# Patient Record
Sex: Male | Born: 1960
Health system: Southern US, Community
[De-identification: ages and names within clinical notes are randomized; demographics above are authoritative.]

## PROBLEM LIST (undated history)

## (undated) DIAGNOSIS — M5030 Other cervical disc degeneration, unspecified cervical region: Secondary | ICD-10-CM

## (undated) DIAGNOSIS — R011 Cardiac murmur, unspecified: Secondary | ICD-10-CM

## (undated) DIAGNOSIS — R002 Palpitations: Secondary | ICD-10-CM

## (undated) DIAGNOSIS — E785 Hyperlipidemia, unspecified: Secondary | ICD-10-CM

## (undated) DIAGNOSIS — I10 Essential (primary) hypertension: Secondary | ICD-10-CM

## (undated) DIAGNOSIS — F32A Depression, unspecified: Secondary | ICD-10-CM

## (undated) DIAGNOSIS — R12 Heartburn: Secondary | ICD-10-CM

## (undated) DIAGNOSIS — E11621 Type 2 diabetes mellitus with foot ulcer: Secondary | ICD-10-CM

## (undated) DIAGNOSIS — R0609 Other forms of dyspnea: Secondary | ICD-10-CM

## (undated) DIAGNOSIS — L97509 Non-pressure chronic ulcer of other part of unspecified foot with unspecified severity: Secondary | ICD-10-CM

## (undated) DIAGNOSIS — T7840XA Allergy, unspecified, initial encounter: Secondary | ICD-10-CM

## (undated) DIAGNOSIS — R634 Abnormal weight loss: Secondary | ICD-10-CM

## (undated) DIAGNOSIS — M1990 Unspecified osteoarthritis, unspecified site: Secondary | ICD-10-CM

## (undated) DIAGNOSIS — R509 Fever, unspecified: Secondary | ICD-10-CM

## (undated) DIAGNOSIS — R635 Abnormal weight gain: Secondary | ICD-10-CM

## (undated) DIAGNOSIS — G4730 Sleep apnea, unspecified: Secondary | ICD-10-CM

## (undated) DIAGNOSIS — R0602 Shortness of breath: Secondary | ICD-10-CM

## (undated) DIAGNOSIS — K6289 Other specified diseases of anus and rectum: Secondary | ICD-10-CM

## (undated) DIAGNOSIS — E119 Type 2 diabetes mellitus without complications: Secondary | ICD-10-CM

## (undated) DIAGNOSIS — R21 Rash and other nonspecific skin eruption: Secondary | ICD-10-CM

## (undated) DIAGNOSIS — F419 Anxiety disorder, unspecified: Secondary | ICD-10-CM

## (undated) DIAGNOSIS — R9431 Abnormal electrocardiogram [ECG] [EKG]: Secondary | ICD-10-CM

## (undated) DIAGNOSIS — H409 Unspecified glaucoma: Secondary | ICD-10-CM

## (undated) DIAGNOSIS — R740 Nonspecific elevation of levels of transaminase and lactic acid dehydrogenase [LDH]: Secondary | ICD-10-CM

## (undated) DIAGNOSIS — I517 Cardiomegaly: Secondary | ICD-10-CM

## (undated) DIAGNOSIS — I5041 Acute combined systolic (congestive) and diastolic (congestive) heart failure: Secondary | ICD-10-CM

## (undated) HISTORY — DX: Type 2 diabetes mellitus with foot ulcer: E11.621

## (undated) HISTORY — DX: Depression, unspecified: F32.A

## (undated) HISTORY — DX: Unspecified glaucoma: H40.9

## (undated) HISTORY — DX: Allergy, unspecified, initial encounter: T78.40XA

## (undated) HISTORY — DX: Essential (primary) hypertension: I10

## (undated) HISTORY — DX: Heartburn: R12

## (undated) HISTORY — DX: Palpitations: R00.2

## (undated) HISTORY — PX: TUMOR REMOVAL: SHX12

## (undated) HISTORY — DX: Cardiomegaly: I51.7

## (undated) HISTORY — PX: HERNIA REPAIR: SHX51

## (undated) HISTORY — DX: Anxiety disorder, unspecified: F41.9

## (undated) HISTORY — DX: Unspecified osteoarthritis, unspecified site: M19.90

## (undated) HISTORY — DX: Shortness of breath: R06.02

## (undated) HISTORY — DX: Cardiac murmur, unspecified: R01.1

## (undated) HISTORY — DX: Other specified diseases of anus and rectum: K62.89

## (undated) HISTORY — DX: Acute combined systolic (congestive) and diastolic (congestive) heart failure: I50.41

## (undated) HISTORY — DX: Type 2 diabetes mellitus without complications: E11.9

## (undated) HISTORY — DX: Abnormal electrocardiogram (ECG) (EKG): R94.31

## (undated) HISTORY — DX: Hyperlipidemia, unspecified: E78.5

## (undated) HISTORY — PX: SPINE SURGERY: SHX786

## (undated) HISTORY — PX: AMPUTATION TOE: SHX6595

## (undated) HISTORY — DX: Other forms of dyspnea: R06.09

## (undated) HISTORY — DX: Fever, unspecified: R50.9

## (undated) HISTORY — DX: Sleep apnea, unspecified: G47.30

## (undated) HISTORY — DX: Other cervical disc degeneration, unspecified cervical region: M50.30

## (undated) HISTORY — DX: Nonspecific elevation of levels of transaminase and lactic acid dehydrogenase (ldh): R74.0

## (undated) HISTORY — DX: Abnormal weight gain: R63.5

## (undated) HISTORY — DX: Type 2 diabetes mellitus with foot ulcer: L97.509

## (undated) HISTORY — DX: Rash and other nonspecific skin eruption: R21

## (undated) HISTORY — DX: Abnormal weight loss: R63.4

---

## 1996-07-19 DIAGNOSIS — H409 Unspecified glaucoma: Secondary | ICD-10-CM | POA: Insufficient documentation

## 1998-12-18 HISTORY — PX: TUMOR REMOVAL: SHX12

## 2000-04-29 ENCOUNTER — Encounter: Payer: Self-pay | Admitting: Specialist

## 2000-04-29 ENCOUNTER — Encounter: Admission: RE | Admit: 2000-04-29 | Discharge: 2000-04-29 | Payer: Self-pay | Admitting: Specialist

## 2001-10-23 ENCOUNTER — Ambulatory Visit (HOSPITAL_BASED_OUTPATIENT_CLINIC_OR_DEPARTMENT_OTHER): Admission: RE | Admit: 2001-10-23 | Discharge: 2001-10-23 | Payer: Self-pay | Admitting: Family Medicine

## 2011-09-11 ENCOUNTER — Other Ambulatory Visit: Payer: Self-pay | Admitting: Internal Medicine

## 2011-10-26 ENCOUNTER — Ambulatory Visit (HOSPITAL_BASED_OUTPATIENT_CLINIC_OR_DEPARTMENT_OTHER)
Admission: RE | Admit: 2011-10-26 | Payer: BC Managed Care – PPO | Source: Ambulatory Visit | Admitting: Orthopedic Surgery

## 2011-10-26 ENCOUNTER — Encounter (HOSPITAL_BASED_OUTPATIENT_CLINIC_OR_DEPARTMENT_OTHER): Admission: RE | Payer: Self-pay | Source: Ambulatory Visit

## 2011-10-26 SURGERY — MINOR RELEASE TRIGGER FINGER/A-1 PULLEY
Anesthesia: LOCAL | Site: Finger | Laterality: Bilateral

## 2011-10-30 ENCOUNTER — Other Ambulatory Visit: Payer: Self-pay | Admitting: Internal Medicine

## 2011-10-30 ENCOUNTER — Other Ambulatory Visit: Payer: Self-pay | Admitting: Physician Assistant

## 2011-11-04 ENCOUNTER — Other Ambulatory Visit: Payer: Self-pay | Admitting: *Deleted

## 2011-11-04 MED ORDER — SIMVASTATIN 20 MG PO TABS: 20.0000 mg | ORAL_TABLET | Freq: Every day | ORAL | Status: DC

## 2011-11-04 MED ORDER — METFORMIN HCL 1000 MG PO TABS: 1000.0000 mg | ORAL_TABLET | Freq: Two times a day (BID) | ORAL | Status: DC

## 2011-11-04 MED ORDER — LISINOPRIL 20 MG PO TABS: 20.0000 mg | ORAL_TABLET | Freq: Every day | ORAL | Status: DC

## 2011-11-08 ENCOUNTER — Ambulatory Visit: Payer: Self-pay | Admitting: Internal Medicine

## 2011-12-06 ENCOUNTER — Other Ambulatory Visit: Payer: Self-pay | Admitting: Physician Assistant

## 2011-12-14 ENCOUNTER — Other Ambulatory Visit: Payer: Self-pay | Admitting: Physician Assistant

## 2011-12-20 ENCOUNTER — Ambulatory Visit (INDEPENDENT_AMBULATORY_CARE_PROVIDER_SITE_OTHER): Payer: BC Managed Care – PPO | Admitting: Internal Medicine

## 2011-12-20 ENCOUNTER — Encounter: Payer: Self-pay | Admitting: Internal Medicine

## 2011-12-20 VITALS — BP 140/89 | HR 75 | Temp 98.4°F | Resp 16 | Ht 68.5 in | Wt 286.0 lb

## 2011-12-20 DIAGNOSIS — I10 Essential (primary) hypertension: Secondary | ICD-10-CM

## 2011-12-20 DIAGNOSIS — E1169 Type 2 diabetes mellitus with other specified complication: Secondary | ICD-10-CM | POA: Insufficient documentation

## 2011-12-20 DIAGNOSIS — E119 Type 2 diabetes mellitus without complications: Secondary | ICD-10-CM | POA: Insufficient documentation

## 2011-12-20 DIAGNOSIS — E785 Hyperlipidemia, unspecified: Secondary | ICD-10-CM | POA: Insufficient documentation

## 2011-12-20 DIAGNOSIS — Z79899 Other long term (current) drug therapy: Secondary | ICD-10-CM

## 2011-12-20 DIAGNOSIS — E1159 Type 2 diabetes mellitus with other circulatory complications: Secondary | ICD-10-CM | POA: Insufficient documentation

## 2011-12-20 DIAGNOSIS — J301 Allergic rhinitis due to pollen: Secondary | ICD-10-CM

## 2011-12-20 DIAGNOSIS — IMO0001 Reserved for inherently not codable concepts without codable children: Secondary | ICD-10-CM

## 2011-12-20 MED ORDER — LISINOPRIL 20 MG PO TABS: 20.0000 mg | ORAL_TABLET | Freq: Every day | ORAL | Status: DC

## 2011-12-20 MED ORDER — METFORMIN HCL 1000 MG PO TABS: 1000.0000 mg | ORAL_TABLET | Freq: Two times a day (BID) | ORAL | Status: DC

## 2011-12-20 MED ORDER — EXENATIDE 10 MCG/0.04ML ~~LOC~~ SOPN: 10.0000 ug | PEN_INJECTOR | Freq: Two times a day (BID) | SUBCUTANEOUS | Status: DC

## 2011-12-20 MED ORDER — SIMVASTATIN 20 MG PO TABS: 20.0000 mg | ORAL_TABLET | Freq: Every day | ORAL | Status: DC

## 2011-12-20 NOTE — Progress Notes (Signed)
  Subjective:    Patient ID: Brandon Rich, male    DOB: Nov 20, 1960, 51 y.o.   MRN: [id number]  HPI Diabetes not to goal. Really wants to lose weight Trigger fingers sched for surgery Review of Systems No change    Objective:   Physical Exam Normal   Results for orders placed in visit on 12/20/11  GLUCOSE, POCT (MANUAL RESULT ENTRY)      Component Value Range   POC Glucose 146 (*) 70 - 99 (mg/dl)  POCT GLYCOSYLATED HEMOGLOBIN (HGB A1C)      Component Value Range   Hemoglobin A1C 8.4         Assessment & Plan:  Agrees to lose wt in 3 m0s and f/up RF all meds 1 year

## 2011-12-21 LAB — COMPREHENSIVE METABOLIC PANEL
ALT: 36 U/L (ref 0–53)
AST: 63 U/L — ABNORMAL HIGH (ref 0–37)
Calcium: 9.6 mg/dL (ref 8.4–10.5)
Chloride: 105 mEq/L (ref 96–112)
Creat: 0.85 mg/dL (ref 0.50–1.35)
Total Bilirubin: 0.5 mg/dL (ref 0.3–1.2)

## 2012-03-11 ENCOUNTER — Other Ambulatory Visit: Payer: Self-pay | Admitting: Physician Assistant

## 2012-03-13 ENCOUNTER — Ambulatory Visit: Payer: BC Managed Care – PPO | Admitting: Internal Medicine

## 2012-04-04 ENCOUNTER — Ambulatory Visit (INDEPENDENT_AMBULATORY_CARE_PROVIDER_SITE_OTHER): Payer: BC Managed Care – PPO | Admitting: Internal Medicine

## 2012-04-04 ENCOUNTER — Encounter: Payer: Self-pay | Admitting: Internal Medicine

## 2012-04-04 VITALS — BP 120/76 | HR 80 | Temp 98.6°F | Resp 16 | Ht 69.0 in | Wt 283.0 lb

## 2012-04-04 DIAGNOSIS — Z7189 Other specified counseling: Secondary | ICD-10-CM

## 2012-04-04 DIAGNOSIS — L02211 Cutaneous abscess of abdominal wall: Secondary | ICD-10-CM

## 2012-04-04 DIAGNOSIS — R7309 Other abnormal glucose: Secondary | ICD-10-CM

## 2012-04-04 DIAGNOSIS — L03319 Cellulitis of trunk, unspecified: Secondary | ICD-10-CM

## 2012-04-04 DIAGNOSIS — Z23 Encounter for immunization: Secondary | ICD-10-CM

## 2012-04-04 DIAGNOSIS — R739 Hyperglycemia, unspecified: Secondary | ICD-10-CM

## 2012-04-04 DIAGNOSIS — E119 Type 2 diabetes mellitus without complications: Secondary | ICD-10-CM

## 2012-04-04 LAB — POCT GLYCOSYLATED HEMOGLOBIN (HGB A1C): Hemoglobin A1C: 11.2

## 2012-04-04 MED ORDER — DOXYCYCLINE HYCLATE 100 MG PO TABS: 100.0000 mg | ORAL_TABLET | Freq: Two times a day (BID) | ORAL | Status: DC

## 2012-04-04 MED ORDER — MUPIROCIN 2 % EX OINT: TOPICAL_OINTMENT | Freq: Three times a day (TID) | CUTANEOUS | Status: DC

## 2012-04-04 NOTE — Patient Instructions (Signed)
Pt to change dressing daily.  Take Abx.  Dry warm compresses.  Recheck in 2 days for packing change.  Pt stated will be out of town this weekend so will need to recheck on Monday for his 2nd packing change if needed.

## 2012-04-04 NOTE — Progress Notes (Signed)
  Subjective:    Patient ID: Brandon Rich, male    DOB: [birth year], 51 y.o.   MRN: [id number]  HPI NIDDM not well controlled. Has red tender fluctuant 2 cm abcess at waist line. Home glucose running over 200 Last aic in June 8.4--not good enough   Review of Systems     Objective:   Physical Exam 2-3cm fluctuant mass at abd waist line  Results for orders placed in visit on 04/04/12  GLUCOSE, POCT (MANUAL RESULT ENTRY)      Component Value Range   POC Glucose 233 (*) 70 - 99 mg/dl  POCT GLYCOSYLATED HEMOGLOBIN (HGB A1C)      Component Value Range   Hemoglobin A1C 11.2         Assessment & Plan:  Abscess/niddm/flu vac Doxy/mupuricin RF to endocrine RF all meds till sees endocrine

## 2012-04-04 NOTE — Progress Notes (Signed)
  Subjective:    Patient ID: Brandon Rich, male    DOB: May 29, 1961, 51 y.o.   MRN: [id number]  HPI    Review of Systems     Objective:   Physical Exam  Procedure:  Consent obtained.  Local anesthesia obtained with 1% lido with epi.  Betadine prep and #11 blade used to make 1 cm incision.  Purulent discharge expressed.  Wound Cx obtained.  Packed aggressively with 1/4 plain packing.  Drsg placed.  Wound care d/w pt.      Assessment & Plan:

## 2012-04-06 ENCOUNTER — Encounter: Payer: Self-pay | Admitting: Internal Medicine

## 2012-04-06 ENCOUNTER — Ambulatory Visit (INDEPENDENT_AMBULATORY_CARE_PROVIDER_SITE_OTHER): Payer: BC Managed Care – PPO | Admitting: Internal Medicine

## 2012-04-06 VITALS — BP 140/80 | HR 88 | Temp 98.2°F | Resp 18 | Wt 282.0 lb

## 2012-04-06 DIAGNOSIS — E119 Type 2 diabetes mellitus without complications: Secondary | ICD-10-CM

## 2012-04-06 DIAGNOSIS — I10 Essential (primary) hypertension: Secondary | ICD-10-CM

## 2012-04-06 MED ORDER — LISINOPRIL 20 MG PO TABS: 20.0000 mg | ORAL_TABLET | Freq: Every day | ORAL | Status: DC

## 2012-04-06 MED ORDER — GLIPIZIDE 5 MG PO TABS: 5.0000 mg | ORAL_TABLET | Freq: Two times a day (BID) | ORAL | Status: DC

## 2012-04-06 NOTE — Patient Instructions (Addendum)

## 2012-04-06 NOTE — Progress Notes (Signed)
  Subjective:    Patient ID: Brandon Rich, male    DOB: 03-Feb-1961, 51 y.o.   MRN: [id number]  HPI Diabetes out of control, has been referred to endocrine. Feels ok today Abscess abdominal wall healing and packing change  Review of Systems     Objective:   Physical Exam Packing removed, re packed 3 inches,sterile dressing. COR nl Glucose over 200 this am, has appt with Dr. Lucianne Muss       Assessment & Plan:  NIDDM Abscess/cellulitis See Dr. Lucianne Muss next week

## 2012-04-08 LAB — WOUND CULTURE: Gram Stain: NONE SEEN

## 2012-04-10 ENCOUNTER — Ambulatory Visit (INDEPENDENT_AMBULATORY_CARE_PROVIDER_SITE_OTHER): Payer: BC Managed Care – PPO | Admitting: Physician Assistant

## 2012-04-10 ENCOUNTER — Encounter: Payer: Self-pay | Admitting: Physician Assistant

## 2012-04-10 ENCOUNTER — Encounter: Payer: Self-pay | Admitting: Internal Medicine

## 2012-04-10 VITALS — BP 128/78 | HR 86 | Temp 98.4°F | Resp 16 | Ht 69.0 in | Wt 286.0 lb

## 2012-04-10 DIAGNOSIS — L03311 Cellulitis of abdominal wall: Secondary | ICD-10-CM

## 2012-04-10 DIAGNOSIS — L02219 Cutaneous abscess of trunk, unspecified: Secondary | ICD-10-CM

## 2012-04-10 NOTE — Progress Notes (Signed)
  Subjective:    Patient ID: Brandon Rich, male    DOB: 09/05/1960, 51 y.o.   MRN: [id number]  HPI  Pt presents to clinic for wound recheck.  He has noticed minimal drainage.  Less pain.  Taking abx without problems.  Changing drsg daily but starting to have a reaction to the adhesive.    Review of Systems  Constitutional: Negative for fever and chills.  Skin: Positive for wound.       Objective:   Physical Exam  Vitals reviewed. Constitutional: He appears well-developed and well-nourished.  HENT:  Head: Normocephalic and atraumatic.  Right Ear: External ear normal.  Left Ear: External ear normal.  Pulmonary/Chest: Effort normal.  Skin: Skin is warm and dry.       Drsg and packing removed.  Minimal d/c on the drsg.  No purulence expressed.  Irrigated with 2% plain lido.  Repacked with 1/4in plain packing loosely.  Drsg placed back on the wound.  Psychiatric: He has a normal mood and affect. His behavior is normal. Judgment and thought content normal.      Assessment & Plan:   1. Cellulitis, abdominal wall

## 2012-04-10 NOTE — Patient Instructions (Signed)
Continue drsg changes daily.  Continue antibiotics.  Recheck 3 days for pacing change.

## 2012-04-13 ENCOUNTER — Ambulatory Visit (INDEPENDENT_AMBULATORY_CARE_PROVIDER_SITE_OTHER): Payer: BC Managed Care – PPO | Admitting: Physician Assistant

## 2012-04-13 VITALS — BP 134/72 | HR 80 | Temp 98.2°F | Resp 16 | Ht 69.0 in | Wt 288.0 lb

## 2012-04-13 DIAGNOSIS — L03311 Cellulitis of abdominal wall: Secondary | ICD-10-CM

## 2012-04-13 DIAGNOSIS — L03319 Cellulitis of trunk, unspecified: Secondary | ICD-10-CM

## 2012-04-13 NOTE — Progress Notes (Signed)
  Subjective:    Patient ID: Brandon Rich, male    DOB: 03-12-1961, 51 y.o.   MRN: [id number]  HPI Pt presents to clinic for wound recheck.  Doing good. Tolerating abx ok.  Minimal d/c from wound.     Review of Systems  Constitutional: Negative for fever and chills.  Skin: Positive for wound.       Objective:   Physical Exam  Vitals reviewed. Constitutional: He is oriented to person, place, and time. He appears well-developed and well-nourished.  HENT:  Head: Normocephalic and atraumatic.  Right Ear: External ear normal.  Left Ear: External ear normal.  Pulmonary/Chest: Effort normal.  Neurological: He is alert and oriented to person, place, and time.  Skin: Skin is warm and dry.       Packing removed.  Minimal erythema around wound edge almost no induration.  Small amount of necrotic tissue at superior wound edge removed underneath good granulation tissue.  Repacked with 1/4in plain packing.  Drsg placed.  Psychiatric: He has a normal mood and affect. His behavior is normal. Judgment and thought content normal.          Assessment & Plan:   1. Cellulitis of abdominal wall

## 2012-04-17 ENCOUNTER — Ambulatory Visit (INDEPENDENT_AMBULATORY_CARE_PROVIDER_SITE_OTHER): Payer: BC Managed Care – PPO | Admitting: Physician Assistant

## 2012-04-17 VITALS — BP 132/76 | HR 69 | Temp 98.4°F | Resp 18

## 2012-04-17 DIAGNOSIS — L02219 Cutaneous abscess of trunk, unspecified: Secondary | ICD-10-CM

## 2012-04-17 DIAGNOSIS — L03311 Cellulitis of abdominal wall: Secondary | ICD-10-CM

## 2012-04-17 NOTE — Progress Notes (Signed)
   [address] [place] [address]   Phone [phone]  Subjective:    Patient ID: Brandon Rich, male    DOB: [birth year], 51 y.o.   MRN: [id number]  HPI Pt presents to clinic for wound recheck.  He is doing good.  Changing the drsg daily, minimal d/c.  Finished antibiotic.   Review of Systems  Constitutional: Negative for fever and chills.  Skin: Positive for wound.       Objective:   Physical Exam  Vitals reviewed. Constitutional: He is oriented to person, place, and time. He appears well-developed and well-nourished.  HENT:  Head: Normocephalic and atraumatic.  Right Ear: External ear normal.  Left Ear: External ear normal.  Eyes: Conjunctivae normal are normal.  Pulmonary/Chest: Effort normal.  Neurological: He is alert and oriented to person, place, and time.  Skin: Skin is warm and dry.       Drsg and packing removed.  Wound looks great. No erythema or induration.  No signs of infection remain.  No packing placed in the wound.  Drsg placed.  Psychiatric: He has a normal mood and affect. His behavior is normal. Judgment and thought content normal.      Assessment & Plan:   1. Cellulitis, abdominal wall    Keep covered.

## 2012-04-17 NOTE — Patient Instructions (Signed)
Keep covered until healed.

## 2012-06-12 ENCOUNTER — Encounter: Payer: Self-pay | Admitting: Internal Medicine

## 2012-06-12 ENCOUNTER — Ambulatory Visit (INDEPENDENT_AMBULATORY_CARE_PROVIDER_SITE_OTHER): Payer: BC Managed Care – PPO | Admitting: Internal Medicine

## 2012-06-12 VITALS — BP 132/78 | HR 77 | Temp 98.2°F | Resp 16 | Ht 68.5 in | Wt 284.6 lb

## 2012-06-12 DIAGNOSIS — E785 Hyperlipidemia, unspecified: Secondary | ICD-10-CM

## 2012-06-12 DIAGNOSIS — Z0000 Encounter for general adult medical examination without abnormal findings: Secondary | ICD-10-CM

## 2012-06-12 DIAGNOSIS — E119 Type 2 diabetes mellitus without complications: Secondary | ICD-10-CM

## 2012-06-12 DIAGNOSIS — Z7189 Other specified counseling: Secondary | ICD-10-CM

## 2012-06-12 DIAGNOSIS — J301 Allergic rhinitis due to pollen: Secondary | ICD-10-CM

## 2012-06-12 DIAGNOSIS — I10 Essential (primary) hypertension: Secondary | ICD-10-CM

## 2012-06-12 LAB — CBC WITH DIFFERENTIAL/PLATELET
Basophils Absolute: 0.1 10*3/uL (ref 0.0–0.1)
Basophils Relative: 1 % (ref 0–1)
Lymphocytes Relative: 33 % (ref 12–46)
MCHC: 35.4 g/dL (ref 30.0–36.0)
Monocytes Absolute: 0.4 10*3/uL (ref 0.1–1.0)
Neutro Abs: 4.2 10*3/uL (ref 1.7–7.7)
Platelets: 318 10*3/uL (ref 150–400)
RDW: 12.9 % (ref 11.5–15.5)
WBC: 7.3 10*3/uL (ref 4.0–10.5)

## 2012-06-12 LAB — POCT URINALYSIS DIPSTICK
Bilirubin, UA: NEGATIVE
Glucose, UA: NEGATIVE
Ketones, UA: 15
Leukocytes, UA: NEGATIVE
Nitrite, UA: NEGATIVE
pH, UA: 5

## 2012-06-12 LAB — POCT UA - MICROSCOPIC ONLY: Yeast, UA: NEGATIVE

## 2012-06-12 LAB — COMPREHENSIVE METABOLIC PANEL
ALT: 41 U/L (ref 0–53)
AST: 66 U/L — ABNORMAL HIGH (ref 0–37)
Albumin: 5.1 g/dL (ref 3.5–5.2)
CO2: 23 mEq/L (ref 19–32)
Calcium: 10.1 mg/dL (ref 8.4–10.5)
Chloride: 102 mEq/L (ref 96–112)
Potassium: 4.2 mEq/L (ref 3.5–5.3)
Sodium: 137 mEq/L (ref 135–145)
Total Protein: 7.1 g/dL (ref 6.0–8.3)

## 2012-06-12 LAB — GLUCOSE, POCT (MANUAL RESULT ENTRY): POC Glucose: 200 mg/dl — AB (ref 70–99)

## 2012-06-12 LAB — IFOBT (OCCULT BLOOD): IFOBT: POSITIVE

## 2012-06-12 LAB — TSH: TSH: 1.002 u[IU]/mL (ref 0.350–4.500)

## 2012-06-12 NOTE — Patient Instructions (Addendum)

## 2012-06-12 NOTE — Progress Notes (Signed)
  Subjective:    Patient ID: Brandon Rich, male    DOB: 1960/11/04, 51 y.o.   MRN: [id number]  HPI Diabetes improving, a1c down to 9.9, working now with endocrinology and on new Victoza/ HTN controlled  Obesity not better See hx sheet   Review of Systems  Constitutional: Negative.   HENT: Negative.   Eyes: Negative.   Cardiovascular: Negative.   Gastrointestinal: Negative.   Musculoskeletal: Negative.   Neurological: Negative.   Hematological: Negative.    See ros form     Objective:   Physical Exam  Vitals reviewed. Constitutional: He is oriented to person, place, and time. He appears well-nourished.  HENT:  Right Ear: External ear normal.  Left Ear: External ear normal.  Nose: Nose normal.  Mouth/Throat: Oropharynx is clear and moist.  Eyes: EOM are normal. Pupils are equal, round, and reactive to light. No scleral icterus.  Neck: Normal range of motion. No thyromegaly present.  Cardiovascular: Normal rate, regular rhythm and normal heart sounds.   Pulmonary/Chest: Effort normal and breath sounds normal.  Abdominal: Soft. There is no tenderness. There is no guarding.  Genitourinary: Prostate normal and penis normal.  Musculoskeletal: Normal range of motion.  Lymphadenopathy:    He has no cervical adenopathy.  Neurological: He is alert and oriented to person, place, and time. No sensory deficit. He exhibits normal muscle tone. Coordination normal.  Skin: Skin is warm. No rash noted.  Psychiatric: He has a normal mood and affect.   Results for orders placed in visit on 06/12/12  GLUCOSE, POCT (MANUAL RESULT ENTRY)      Component Value Range   POC Glucose 200 (*) 70 - 99 mg/dl  POCT GLYCOSYLATED HEMOGLOBIN (HGB A1C)      Component Value Range   Hemoglobin A1C 9.9    POCT UA - MICROSCOPIC ONLY      Component Value Range   WBC, Ur, HPF, POC 0-6     RBC, urine, microscopic 0-3     Bacteria, U Microscopic neg     Mucus, UA large     Epithelial cells, urine per  micros 0-5     Crystals, Ur, HPF, POC neg     Casts, Ur, LPF, POC 0-3     Yeast, UA neg    POCT URINALYSIS DIPSTICK      Component Value Range   Color, UA yellow     Clarity, UA clear     Glucose, UA neg     Bilirubin, UA neg     Ketones, UA 15     Spec Grav, UA >=1.030     Blood, UA neg     pH, UA 5.0     Protein, UA 30     Urobilinogen, UA 0.2     Nitrite, UA neg     Leukocytes, UA Negative           Assessment & Plan:  RF meds 37yr Shingles vac ordered Lose 20 lbs Continue with endocrine

## 2012-06-12 NOTE — Progress Notes (Signed)
  Subjective:    Patient ID: Brandon Rich, male    DOB: 10-26-1960, 51 y.o.   MRN: [id number]  HPI    Review of Systems  Constitutional: Negative.   HENT: Negative.   Eyes: Negative.   Respiratory: Negative.   Cardiovascular: Negative.   Gastrointestinal: Negative.   Genitourinary: Negative.   Musculoskeletal: Negative.   Skin: Negative.   Neurological: Negative.   Hematological: Negative.   Psychiatric/Behavioral: Negative.        Objective:   Physical Exam        Assessment & Plan:

## 2012-07-07 ENCOUNTER — Encounter: Payer: Self-pay | Admitting: Internal Medicine

## 2012-07-08 NOTE — Telephone Encounter (Signed)
Dr Perrin Maltese did not order lipids on this pt can we order lipids and get pt to come in fasting at no charge? Dr Perrin Maltese is out of town.

## 2012-07-10 ENCOUNTER — Other Ambulatory Visit: Payer: Self-pay | Admitting: Physician Assistant

## 2012-07-10 DIAGNOSIS — E785 Hyperlipidemia, unspecified: Secondary | ICD-10-CM

## 2012-08-01 ENCOUNTER — Ambulatory Visit (INDEPENDENT_AMBULATORY_CARE_PROVIDER_SITE_OTHER): Payer: BC Managed Care – PPO | Admitting: Physician Assistant

## 2012-08-01 VITALS — BP 121/80 | HR 84

## 2012-08-01 DIAGNOSIS — E785 Hyperlipidemia, unspecified: Secondary | ICD-10-CM

## 2012-08-01 LAB — LIPID PANEL
Cholesterol: 98 mg/dL (ref 0–200)
HDL: 27 mg/dL — ABNORMAL LOW (ref >39)
Total CHOL/HDL Ratio: 3.6 Ratio
VLDL: 43 mg/dL — ABNORMAL HIGH (ref 0–40)

## 2012-08-01 NOTE — Progress Notes (Signed)
This 52 y.o. male presents for fasting lipid profile.  This test was inadvertently left off at his CPE in October.

## 2012-08-01 NOTE — Progress Notes (Signed)
Patient here for labs only. 

## 2012-08-07 ENCOUNTER — Encounter: Payer: Self-pay | Admitting: Radiology

## 2012-10-07 ENCOUNTER — Other Ambulatory Visit: Payer: Self-pay | Admitting: Physician Assistant

## 2013-01-10 ENCOUNTER — Other Ambulatory Visit: Payer: Self-pay | Admitting: Internal Medicine

## 2013-02-22 ENCOUNTER — Other Ambulatory Visit: Payer: Self-pay | Admitting: Internal Medicine

## 2013-03-26 ENCOUNTER — Ambulatory Visit: Payer: BC Managed Care – PPO | Admitting: Internal Medicine

## 2013-04-14 ENCOUNTER — Ambulatory Visit (INDEPENDENT_AMBULATORY_CARE_PROVIDER_SITE_OTHER): Payer: BC Managed Care – PPO | Admitting: Radiology

## 2013-04-14 DIAGNOSIS — Z23 Encounter for immunization: Secondary | ICD-10-CM

## 2013-05-04 ENCOUNTER — Other Ambulatory Visit: Payer: Self-pay | Admitting: Internal Medicine

## 2013-05-15 ENCOUNTER — Other Ambulatory Visit: Payer: Self-pay | Admitting: Physician Assistant

## 2013-06-25 ENCOUNTER — Ambulatory Visit: Payer: BC Managed Care – PPO

## 2013-06-25 ENCOUNTER — Encounter: Payer: Self-pay | Admitting: Internal Medicine

## 2013-06-25 ENCOUNTER — Ambulatory Visit (INDEPENDENT_AMBULATORY_CARE_PROVIDER_SITE_OTHER): Payer: BC Managed Care – PPO | Admitting: Internal Medicine

## 2013-06-25 VITALS — BP 120/80 | HR 71 | Temp 98.2°F | Resp 16 | Ht 68.5 in | Wt 272.4 lb

## 2013-06-25 DIAGNOSIS — E119 Type 2 diabetes mellitus without complications: Secondary | ICD-10-CM

## 2013-06-25 DIAGNOSIS — R2241 Localized swelling, mass and lump, right lower limb: Secondary | ICD-10-CM

## 2013-06-25 DIAGNOSIS — E785 Hyperlipidemia, unspecified: Secondary | ICD-10-CM

## 2013-06-25 DIAGNOSIS — Z79899 Other long term (current) drug therapy: Secondary | ICD-10-CM

## 2013-06-25 DIAGNOSIS — Z0000 Encounter for general adult medical examination without abnormal findings: Secondary | ICD-10-CM

## 2013-06-25 DIAGNOSIS — R229 Localized swelling, mass and lump, unspecified: Secondary | ICD-10-CM

## 2013-06-25 DIAGNOSIS — I10 Essential (primary) hypertension: Secondary | ICD-10-CM

## 2013-06-25 LAB — CBC WITH DIFFERENTIAL/PLATELET
Basophils Absolute: 0 10*3/uL (ref 0.0–0.1)
Basophils Relative: 1 % (ref 0–1)
Eosinophils Relative: 4 % (ref 0–5)
Lymphocytes Relative: 31 % (ref 12–46)
Lymphs Abs: 2.5 10*3/uL (ref 0.7–4.0)
MCHC: 35.9 g/dL (ref 30.0–36.0)
MCV: 79.4 fL (ref 78.0–100.0)
Neutro Abs: 4.8 10*3/uL (ref 1.7–7.7)
Neutrophils Relative %: 58 % (ref 43–77)
Platelets: 306 10*3/uL (ref 150–400)
RDW: 13.3 % (ref 11.5–15.5)
WBC: 8.1 10*3/uL (ref 4.0–10.5)

## 2013-06-25 LAB — POCT URINALYSIS DIPSTICK
Blood, UA: NEGATIVE
Leukocytes, UA: NEGATIVE
Nitrite, UA: NEGATIVE
Protein, UA: NEGATIVE
Urobilinogen, UA: 0.2

## 2013-06-25 LAB — COMPREHENSIVE METABOLIC PANEL
ALT: 27 U/L (ref 0–53)
AST: 46 U/L — ABNORMAL HIGH (ref 0–37)
Alkaline Phosphatase: 58 U/L (ref 39–117)
Calcium: 9.9 mg/dL (ref 8.4–10.5)
Sodium: 138 mEq/L (ref 135–145)
Total Bilirubin: 0.5 mg/dL (ref 0.3–1.2)
Total Protein: 7.4 g/dL (ref 6.0–8.3)

## 2013-06-25 LAB — POCT UA - MICROSCOPIC ONLY: Crystals, Ur, HPF, POC: NEGATIVE

## 2013-06-25 LAB — POCT GLYCOSYLATED HEMOGLOBIN (HGB A1C): Hemoglobin A1C: 6.6

## 2013-06-25 LAB — LIPID PANEL
LDL Cholesterol: 41 mg/dL (ref 0–99)
Total CHOL/HDL Ratio: 3.1 Ratio
VLDL: 30 mg/dL (ref 0–40)

## 2013-06-25 LAB — GLUCOSE, POCT (MANUAL RESULT ENTRY): POC Glucose: 120 mg/dl — AB (ref 70–99)

## 2013-06-25 LAB — TSH: TSH: 0.928 u[IU]/mL (ref 0.350–4.500)

## 2013-06-25 MED ORDER — LISINOPRIL 20 MG PO TABS: 20.0000 mg | ORAL_TABLET | Freq: Every day | ORAL | Status: DC

## 2013-06-25 MED ORDER — ATORVASTATIN CALCIUM 20 MG PO TABS: 20.0000 mg | ORAL_TABLET | Freq: Every day | ORAL | Status: DC

## 2013-06-25 MED ORDER — METFORMIN HCL 1000 MG PO TABS: 1000.0000 mg | ORAL_TABLET | Freq: Two times a day (BID) | ORAL | Status: DC

## 2013-06-25 MED ORDER — FLUTICASONE PROPIONATE 50 MCG/ACT NA SUSP: 2.0000 | Freq: Every day | NASAL | Status: DC

## 2013-06-25 NOTE — Patient Instructions (Signed)
Atorvastatin tablets What is this medicine? ATORVASTATIN (a TORE va sta tin) is known as a HMG-CoA reductase inhibitor or 'statin'. It lowers the level of cholesterol and triglycerides in the blood. This drug may also reduce the risk of heart attack, stroke, or other health problems in patients with risk factors for heart disease. Diet and lifestyle changes are often used with this drug. This medicine may be used for other purposes; ask your health care provider or pharmacist if you have questions. COMMON BRAND NAME(S): Lipitor What should I tell my health care provider before I take this medicine? They need to know if you have any of these conditions: -frequently drink alcoholic beverages -history of stroke, TIA -kidney disease -liver disease -muscle aches or weakness -other medical condition -an unusual or allergic reaction to atorvastatin, other medicines, foods, dyes, or preservatives -pregnant or trying to get pregnant -breast-feeding How should I use this medicine? Take this medicine by mouth with a glass of water. Follow the directions on the prescription label. You can take this medicine with or without food. Take your doses at regular intervals. Do not take your medicine more often than directed. Talk to your pediatrician regarding the use of this medicine in children. While this drug may be prescribed for children as young as 44 years old for selected conditions, precautions do apply. Overdosage: If you think you have taken too much of this medicine contact a poison control center or emergency room at once. NOTE: This medicine is only for you. Do not share this medicine with others. What if I miss a dose? If you miss a dose, take it as soon as you can. If it is almost time for your next dose, take only that dose. Do not take double or extra doses. What may interact with this medicine? Do not take this medicine with any of the following medications: -red yeast  rice -telaprevir -telithromycin -voriconazole This medicine may also interact with the following medications: -alcohol -antiviral medicines for HIV or AIDS -boceprevir -certain antibiotics like clarithromycin, erythromycin, troleandomycin -certain medicines for cholesterol like fenofibrate or gemfibrozil -cimetidine -clarithromycin -colchicine -cyclosporine -digoxin -male hormones, like estrogens or progestins and birth control pills -grapefruit juice -medicines for fungal infections like fluconazole, itraconazole, ketoconazole -niacin -rifampin -spironolactone This list may not describe all possible interactions. Give your health care provider a list of all the medicines, herbs, non-prescription drugs, or dietary supplements you use. Also tell them if you smoke, drink alcohol, or use illegal drugs. Some items may interact with your medicine. What should I watch for while using this medicine? Visit your doctor or health care professional for regular check-ups. You may need regular tests to make sure your liver is working properly. Tell your doctor or health care professional right away if you get any unexplained muscle pain, tenderness, or weakness, especially if you also have a fever and tiredness. Your doctor or health care professional may tell you to stop taking this medicine if you develop muscle problems. If your muscle problems do not go away after stopping this medicine, contact your health care professional. This drug is only part of a total heart-health program. Your doctor or a dietician can suggest a low-cholesterol and low-fat diet to help. Avoid alcohol and smoking, and keep a proper exercise schedule. Do not use this drug if you are pregnant or breast-feeding. Serious side effects to an unborn child or to an infant are possible. Talk to your doctor or pharmacist for more information. This medicine may affect  blood sugar levels. If you have diabetes, check with your doctor  or health care professional before you change your diet or the dose of your diabetic medicine. If you are going to have surgery tell your health care professional that you are taking this drug. What side effects may I notice from receiving this medicine? Side effects that you should report to your doctor or health care professional as soon as possible: -allergic reactions like skin rash, itching or hives, swelling of the face, lips, or tongue -dark urine -fever -joint pain -muscle cramps, pain -redness, blistering, peeling or loosening of the skin, including inside the mouth -trouble passing urine or change in the amount of urine -unusually weak or tired -yellowing of eyes or skin Side effects that usually do not require medical attention (report to your doctor or health care professional if they continue or are bothersome): -constipation -heartburn -stomach gas, pain, upset This list may not describe all possible side effects. Call your doctor for medical advice about side effects. You may report side effects to FDA at 1-800-FDA-1088. Where should I keep my medicine? Keep out of the reach of children. Store at room temperature between 20 to 25 degrees C (68 to 77 degrees F). Throw away any unused medicine after the expiration date. NOTE: This sheet is a summary. It may not cover all possible information. If you have questions about this medicine, talk to your doctor, pharmacist, or health care provider.  2014, [employer]vier/Gold Standard. (2011-05-25 16:10:96) Calorie Counting Diet A calorie counting diet requires you to eat the number of calories that are right for you in a day. Calories are the measurement of how much energy you get from the food you eat. Eating the right amount of calories is important for staying at a healthy weight. If you eat too many calories, your body will store them as fat and you may gain weight. If you eat too few calories, you may lose weight. Counting the number of  calories you eat during a day will help you know if you are eating the right amount. A Registered Dietitian can determine how many calories you need in a day. The amount of calories needed varies from person to person. If your goal is to lose weight, you will need to eat fewer calories. Losing weight can benefit you if you are overweight or have health problems such as heart disease, high blood pressure, or diabetes. If your goal is to gain weight, you will need to eat more calories. Gaining weight may be necessary if you have a certain health problem that causes your body to need more energy. TIPS Whether you are increasing or decreasing the number of calories you eat during a day, it may be hard to get used to changes in what you eat and drink. The following are tips to help you keep track of the number of calories you eat.  Measure foods at home with measuring cups. This helps you know the amount of food and number of calories you are eating.  Restaurants often serve food in amounts that are larger than 1 serving. While eating out, estimate how many servings of a food you are given. For example, a serving of cooked rice is  cup or about the size of half of a fist. Knowing serving sizes will help you be aware of how much food you are eating at restaurants.  Ask for smaller portion sizes or child-size portions at restaurants.  Plan to eat half of a  meal at a restaurant. Take the rest home or share the other half with a friend.  Read the Nutrition Facts panel on food labels for calorie content and serving size. You can find out how many servings are in a package, the size of a serving, and the number of calories each serving has.  For example, a package might contain 3 cookies. The Nutrition Facts panel on that package says that 1 serving is 1 cookie. Below that, it will say there are 3 servings in the container. The calories section of the Nutrition Facts label says there are 90 calories. This means  there are 90 calories in 1 cookie (1 serving). If you eat 1 cookie you have eaten 90 calories. If you eat all 3 cookies, you have eaten 270 calories (3 servings x 90 calories = 270 calories). The list below tells you how big or small some common portion sizes are.  1 oz.........4 stacked dice.  3 oz........Marland KitchenDeck of cards.  1 tsp.......Marland KitchenTip of little finger.  1 tbs......Marland KitchenMarland KitchenThumb.  2 tbs.......Marland KitchenGolf ball.   cup......Marland KitchenHalf of a fist.  1 cup.......Marland KitchenA fist. KEEP A FOOD LOG Write down every food item you eat, the amount you eat, and the number of calories in each food you eat during the day. At the end of the day, you can add up the total number of calories you have eaten. It may help to keep a list like the one below. Find out the calorie information by reading the Nutrition Facts panel on food labels. Breakfast  Bran cereal (1 cup, 110 calories).  Fat-free milk ( cup, 45 calories). Snack  Apple (1 medium, 80 calories). Lunch  Spinach (1 cup, 20 calories).  Tomato ( medium, 20 calories).  Chicken breast strips (3 oz, 165 calories).  Shredded cheddar cheese ( cup, 110 calories).  Light [place] dressing (2 tbs, 60 calories).  Whole-wheat bread (1 slice, 80 calories).  Tub margarine (1 tsp, 35 calories).  Vegetable soup (1 cup, 160 calories). Dinner  Pork chop (3 oz, 190 calories).  Brown rice (1 cup, 215 calories).  Steamed broccoli ( cup, 20 calories).  Strawberries (1  cup, 65 calories).  Whipped cream (1 tbs, 50 calories). Daily Calorie Total: 1425 Document Released: 07/05/2005 Document Revised: 09/27/2011 Document Reviewed: 12/30/2006 [facility] Patient Information 2014 [place], [place].

## 2013-06-25 NOTE — Progress Notes (Signed)
Subjective:    Patient ID: Brandon Rich, male    DOB: 1960/10/11, 52 y.o.   MRN: [id number]  HPI Is doing very well with diabetes. Dr. Sharl Ma is his endocrinologist and has him controlled  On Invokarna, metformin, victoza. HTN controlled, he would like to switch to lipitor for statin.  Allergys controlled with fluticasone nasal. No co anything   Review of Systems  Constitutional: Negative.   HENT: Negative.   Eyes: Negative.   Respiratory: Negative.   Cardiovascular: Negative.   Gastrointestinal: Negative.   Endocrine: Negative.   Genitourinary: Negative.   Musculoskeletal: Negative.   Skin: Negative.   Allergic/Immunologic: Positive for environmental allergies.  Neurological: Negative.   Hematological: Negative.   Psychiatric/Behavioral: Negative.        Objective:   Physical Exam  Vitals reviewed. Constitutional: He is oriented to person, place, and time. He appears well-developed and well-nourished. No distress.  HENT:  Head: Normocephalic.  Right Ear: External ear normal.  Left Ear: External ear normal.  Nose: Nose normal.  Mouth/Throat: Oropharynx is clear and moist.  Eyes: Conjunctivae and EOM are normal. Pupils are equal, round, and reactive to light.  Neck: Normal range of motion. Neck supple. No tracheal deviation present. No thyromegaly present.  Cardiovascular: Normal rate, normal heart sounds and intact distal pulses.   Pulmonary/Chest: Effort normal and breath sounds normal.  Abdominal: Soft. Bowel sounds are normal. He exhibits no mass. There is no tenderness.  Genitourinary: Rectum normal, prostate normal and penis normal.  Musculoskeletal: Normal range of motion. He exhibits edema and tenderness.       Right foot: He exhibits swelling and deformity. He exhibits normal range of motion, no tenderness, no bony tenderness, normal capillary refill and no crepitus.       Feet:  Right great toe marked deformity with no pain and he had no concern. It  also is grayish color. Left toe is beginning to deform.   Lymphadenopathy:    He has no cervical adenopathy.  Neurological: He is alert and oriented to person, place, and time. He has normal reflexes. He displays no atrophy. No cranial nerve deficit or sensory deficit. He exhibits normal muscle tone. Coordination and gait normal.  Skin: No rash noted.  Psychiatric: He has a normal mood and affect. His behavior is normal. Judgment and thought content normal.   Past hx of benign chondromyxoid fibroma bone tumor  Results for orders placed in visit on 06/25/13  GLUCOSE, POCT (MANUAL RESULT ENTRY)      Result Value Range   POC Glucose 120 (*) 70 - 99 mg/dl  POCT GLYCOSYLATED HEMOGLOBIN (HGB A1C)      Result Value Range   Hemoglobin A1C 6.6    POCT UA - MICROSCOPIC ONLY      Result Value Range   WBC, Ur, HPF, POC neg     RBC, urine, microscopic 0-2     Bacteria, U Microscopic neg     Mucus, UA trace     Epithelial cells, urine per micros 0-1     Crystals, Ur, HPF, POC neg     Casts, Ur, LPF, POC neg     Yeast, UA neg    POCT URINALYSIS DIPSTICK      Result Value Range   Color, UA yellow     Clarity, UA clear     Glucose, UA 500     Bilirubin, UA neg     Ketones, UA trace     Spec Grav, UA >=  1.030     Blood, UA neg     pH, UA 5.0     Protein, UA neg     Urobilinogen, UA 0.2     Nitrite, UA neg     Leukocytes, UA Negative     EKG normal [facility] reading (PRIMARY) by  Dr Perrin Maltese. No hx for gout or pain xray shows deformity and lucencys, cause unclear       Assessment & Plan:  RF meds one year Switch to lipitor and stop simvastatin Refer to Dr. 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

## 2013-06-25 NOTE — Progress Notes (Signed)
   Subjective:    Patient ID: Brandon Rich, male    DOB: September 10, 1960, 52 y.o.   MRN: [id number]  HPI    Review of Systems  Constitutional: Negative.   HENT: Negative.   Eyes: Negative.   Respiratory: Negative.   Cardiovascular: Negative.   Gastrointestinal: Negative.   Endocrine: Negative.   Genitourinary: Negative.   Musculoskeletal: Negative.   Skin: Negative.   Allergic/Immunologic: Positive for environmental allergies.  Neurological: Negative.   Hematological: Negative.   Psychiatric/Behavioral: Negative.        Objective:   Physical Exam        Assessment & Plan:

## 2013-07-06 ENCOUNTER — Telehealth: Payer: Self-pay

## 2013-07-06 NOTE — Telephone Encounter (Signed)
Medicare diabetic supply form placed in PA box for signature and faxing/scanning.

## 2013-08-23 DIAGNOSIS — M19079 Primary osteoarthritis, unspecified ankle and foot: Secondary | ICD-10-CM | POA: Insufficient documentation

## 2013-08-23 DIAGNOSIS — M79675 Pain in left toe(s): Secondary | ICD-10-CM | POA: Insufficient documentation

## 2013-08-23 DIAGNOSIS — M2042 Other hammer toe(s) (acquired), left foot: Secondary | ICD-10-CM | POA: Insufficient documentation

## 2013-12-24 ENCOUNTER — Ambulatory Visit: Payer: BC Managed Care – PPO | Admitting: Family Medicine

## 2014-02-04 ENCOUNTER — Encounter: Payer: Self-pay | Admitting: Internal Medicine

## 2014-02-11 ENCOUNTER — Ambulatory Visit: Payer: BC Managed Care – PPO | Admitting: Family Medicine

## 2014-03-11 ENCOUNTER — Ambulatory Visit: Payer: BC Managed Care – PPO | Admitting: Family Medicine

## 2014-04-24 ENCOUNTER — Ambulatory Visit (INDEPENDENT_AMBULATORY_CARE_PROVIDER_SITE_OTHER): Payer: BC Managed Care – PPO | Admitting: Family Medicine

## 2014-04-24 DIAGNOSIS — Z23 Encounter for immunization: Secondary | ICD-10-CM

## 2014-05-03 ENCOUNTER — Other Ambulatory Visit: Payer: Self-pay

## 2014-05-06 ENCOUNTER — Ambulatory Visit: Payer: BC Managed Care – PPO | Admitting: Family Medicine

## 2014-06-06 ENCOUNTER — Ambulatory Visit (INDEPENDENT_AMBULATORY_CARE_PROVIDER_SITE_OTHER): Payer: BC Managed Care – PPO | Admitting: Internal Medicine

## 2014-06-06 ENCOUNTER — Telehealth: Payer: Self-pay

## 2014-06-06 ENCOUNTER — Encounter: Payer: Self-pay | Admitting: Internal Medicine

## 2014-06-06 VITALS — BP 118/76 | HR 73 | Temp 98.1°F | Resp 16 | Ht 70.0 in | Wt 273.0 lb

## 2014-06-06 DIAGNOSIS — Z79899 Other long term (current) drug therapy: Secondary | ICD-10-CM

## 2014-06-06 DIAGNOSIS — E1169 Type 2 diabetes mellitus with other specified complication: Secondary | ICD-10-CM

## 2014-06-06 DIAGNOSIS — Z0000 Encounter for general adult medical examination without abnormal findings: Secondary | ICD-10-CM

## 2014-06-06 DIAGNOSIS — Z23 Encounter for immunization: Secondary | ICD-10-CM

## 2014-06-06 DIAGNOSIS — I10 Essential (primary) hypertension: Secondary | ICD-10-CM

## 2014-06-06 DIAGNOSIS — R2241 Localized swelling, mass and lump, right lower limb: Secondary | ICD-10-CM

## 2014-06-06 DIAGNOSIS — Z1211 Encounter for screening for malignant neoplasm of colon: Secondary | ICD-10-CM

## 2014-06-06 DIAGNOSIS — E785 Hyperlipidemia, unspecified: Secondary | ICD-10-CM

## 2014-06-06 DIAGNOSIS — E119 Type 2 diabetes mellitus without complications: Secondary | ICD-10-CM

## 2014-06-06 LAB — CBC WITH DIFFERENTIAL/PLATELET
BASOS ABS: 0.1 10*3/uL (ref 0.0–0.1)
Basophils Relative: 1 % (ref 0–1)
Eosinophils Absolute: 0.2 10*3/uL (ref 0.0–0.7)
Eosinophils Relative: 3 % (ref 0–5)
HEMATOCRIT: 44 % (ref 39.0–52.0)
Hemoglobin: 15.5 g/dL (ref 13.0–17.0)
Lymphocytes Relative: 30 % (ref 12–46)
Lymphs Abs: 2.4 10*3/uL (ref 0.7–4.0)
MCH: 28.7 pg (ref 26.0–34.0)
MCHC: 35.2 g/dL (ref 30.0–36.0)
MCV: 81.5 fL (ref 78.0–100.0)
MPV: 8.7 fL — AB (ref 9.4–12.4)
Monocytes Absolute: 0.7 10*3/uL (ref 0.1–1.0)
Monocytes Relative: 9 % (ref 3–12)
NEUTROS ABS: 4.6 10*3/uL (ref 1.7–7.7)
Neutrophils Relative %: 57 % (ref 43–77)
Platelets: 259 10*3/uL (ref 150–400)
RBC: 5.4 MIL/uL (ref 4.22–5.81)
RDW: 13.8 % (ref 11.5–15.5)
WBC: 8 10*3/uL (ref 4.0–10.5)

## 2014-06-06 LAB — POCT GLYCOSYLATED HEMOGLOBIN (HGB A1C): HEMOGLOBIN A1C: 7.2

## 2014-06-06 LAB — POCT URINALYSIS DIPSTICK
Bilirubin, UA: NEGATIVE
GLUCOSE UA: 500
Ketones, UA: 40
Leukocytes, UA: NEGATIVE
NITRITE UA: NEGATIVE
PH UA: 5
Protein, UA: NEGATIVE
RBC UA: NEGATIVE
SPEC GRAV UA: 1.025
Urobilinogen, UA: 0.2

## 2014-06-06 LAB — TSH: TSH: 0.919 u[IU]/mL (ref 0.350–4.500)

## 2014-06-06 LAB — POCT UA - MICROSCOPIC ONLY
BACTERIA, U MICROSCOPIC: NEGATIVE
Casts, Ur, LPF, POC: NEGATIVE
Crystals, Ur, HPF, POC: NEGATIVE
MUCUS UA: NEGATIVE
WBC, Ur, HPF, POC: NEGATIVE
YEAST UA: NEGATIVE

## 2014-06-06 LAB — COMPREHENSIVE METABOLIC PANEL
ALT: 21 U/L (ref 0–53)
AST: 40 U/L — ABNORMAL HIGH (ref 0–37)
Albumin: 4.7 g/dL (ref 3.5–5.2)
Alkaline Phosphatase: 58 U/L (ref 39–117)
BUN: 15 mg/dL (ref 6–23)
CALCIUM: 9.6 mg/dL (ref 8.4–10.5)
CO2: 24 meq/L (ref 19–32)
CREATININE: 0.78 mg/dL (ref 0.50–1.35)
Chloride: 103 mEq/L (ref 96–112)
Glucose, Bld: 105 mg/dL — ABNORMAL HIGH (ref 70–99)
Potassium: 4 mEq/L (ref 3.5–5.3)
Sodium: 139 mEq/L (ref 135–145)
Total Bilirubin: 0.7 mg/dL (ref 0.2–1.2)
Total Protein: 7.1 g/dL (ref 6.0–8.3)

## 2014-06-06 LAB — LIPID PANEL
Cholesterol: 85 mg/dL (ref 0–200)
HDL: 29 mg/dL — AB (ref >39)
LDL CALC: 32 mg/dL (ref 0–99)
TRIGLYCERIDES: 118 mg/dL (ref <150)
Total CHOL/HDL Ratio: 2.9 Ratio
VLDL: 24 mg/dL (ref 0–40)

## 2014-06-06 LAB — GLUCOSE, POCT (MANUAL RESULT ENTRY): POC Glucose: 101 mg/dl — AB (ref 70–99)

## 2014-06-06 MED ORDER — METFORMIN HCL 1000 MG PO TABS: 1000.0000 mg | ORAL_TABLET | Freq: Two times a day (BID) | ORAL | Status: DC

## 2014-06-06 MED ORDER — GLIPIZIDE 5 MG PO TABS: 5.0000 mg | ORAL_TABLET | Freq: Two times a day (BID) | ORAL | Status: DC

## 2014-06-06 MED ORDER — ATORVASTATIN CALCIUM 20 MG PO TABS: 20.0000 mg | ORAL_TABLET | Freq: Every day | ORAL | Status: DC

## 2014-06-06 MED ORDER — LIRAGLUTIDE 18 MG/3ML ~~LOC~~ SOPN: 1.8000 mg | PEN_INJECTOR | Freq: Every day | SUBCUTANEOUS | Status: DC

## 2014-06-06 MED ORDER — CANAGLIFLOZIN 300 MG PO TABS: 300.0000 mg | ORAL_TABLET | Freq: Every day | ORAL | Status: DC

## 2014-06-06 NOTE — Patient Instructions (Signed)
Hypoglycemia Hypoglycemia occurs when the glucose in your blood is too low. Glucose is a type of sugar that is your body's main energy source. Hormones, such as insulin and glucagon, control the level of glucose in the blood. Insulin lowers blood glucose and glucagon increases blood glucose. Having too much insulin in your blood stream, or not eating enough food containing sugar, can result in hypoglycemia. Hypoglycemia can happen to people with or without diabetes. It can develop quickly and can be a medical emergency.  CAUSES   Missing or delaying meals.  Not eating enough carbohydrates at meals.  Taking too much diabetes medicine.  Not timing your oral diabetes medicine or insulin doses with meals, snacks, and exercise.  Nausea and vomiting.  Certain medicines.  Severe illnesses, such as hepatitis, kidney disorders, and certain eating disorders.  Increased activity or exercise without eating something extra or adjusting medicines.  Drinking too much alcohol.  A nerve disorder that affects body functions like your heart rate, blood pressure, and digestion (autonomic neuropathy).  A condition where the stomach muscles do not function properly (gastroparesis). Therefore, medicines and food may not absorb properly.  Rarely, a tumor of the pancreas can produce too much insulin. SYMPTOMS   Hunger.  Sweating (diaphoresis).  Change in body temperature.  Shakiness.  Headache.  Anxiety.  Lightheadedness.  Irritability.  Difficulty concentrating.  Dry mouth.  Tingling or numbness in the hands or feet.  Restless sleep or sleep disturbances.  Altered speech and coordination.  Change in mental status.  Seizures or prolonged convulsions.  Combativeness.  Drowsiness (lethargic).  Weakness.  Increased heart rate or palpitations.  Confusion.  Pale, gray skin color.  Blurred or double vision.  Fainting. DIAGNOSIS  A physical exam and medical history will be  performed. Your caregiver may make a diagnosis based on your symptoms. Blood tests and other lab tests may be performed to confirm a diagnosis. Once the diagnosis is made, your caregiver will see if your signs and symptoms go away once your blood glucose is raised.  TREATMENT  Usually, you can easily treat your hypoglycemia when you notice symptoms.  Check your blood glucose. If it is less than 70 mg/dl, take one of the following:   3-4 glucose tablets.    cup juice.    cup regular soda.   1 cup skim milk.   -1 tube of glucose gel.   5-6 hard candies.   Avoid high-fat drinks or food that may delay a rise in blood glucose levels.  Do not take more than the recommended amount of sugary foods, drinks, gel, or tablets. Doing so will cause your blood glucose to go too high.   Wait 10-15 minutes and recheck your blood glucose. If it is still less than 70 mg/dl or below your target range, repeat treatment.   Eat a snack if it is more than 1 hour until your next meal.  There may be a time when your blood glucose may go so low that you are unable to treat yourself at home when you start to notice symptoms. You may need someone to help you. You may even faint or be unable to swallow. If you cannot treat yourself, someone will need to bring you to the hospital.  HOME CARE INSTRUCTIONS  If you have diabetes, follow your diabetes management plan by:  Taking your medicines as directed.  Following your exercise plan.  Following your meal plan. Do not skip meals. Eat on time.  Testing your blood   glucose regularly. Check your blood glucose before and after exercise. If you exercise longer or different than usual, be sure to check blood glucose more frequently.  Wearing your medical alert jewelry that says you have diabetes.  Identify the cause of your hypoglycemia. Then, develop ways to prevent the recurrence of hypoglycemia.  Do not take a hot bath or shower right after an  insulin shot.  Always carry treatment with you. Glucose tablets are the easiest to carry.  If you are going to drink alcohol, drink it only with meals.  Tell friends or family members ways to keep you safe during a seizure. This may include removing hard or sharp objects from the area or turning you on your side.  Maintain a healthy weight. SEEK MEDICAL CARE IF:   You are having problems keeping your blood glucose in your target range.  You are having frequent episodes of hypoglycemia.  You feel you might be having side effects from your medicines.  You are not sure why your blood glucose is dropping so low.  You notice a change in vision or a new problem with your vision. SEEK IMMEDIATE MEDICAL CARE IF:   Confusion develops.  A change in mental status occurs.  The inability to swallow develops.  Fainting occurs. Document Released: 07/05/2005 Document Revised: 07/10/2013 Document Reviewed: 11/01/2011 [facility] Patient Information 2015 [place], [place]. This information is not intended to replace advice given to you by your health care provider. Make sure you discuss any questions you have with your health care provider. Calorie Counting for Weight Loss Calories are energy you get from the things you eat and drink. Your body uses this energy to keep you going throughout the day. The number of calories you eat affects your weight. When you eat more calories than your body needs, your body stores the extra calories as fat. When you eat fewer calories than your body needs, your body burns fat to get the energy it needs. Calorie counting means keeping track of how many calories you eat and drink each day. If you make sure to eat fewer calories than your body needs, you should lose weight. In order for calorie counting to work, you will need to eat the number of calories that are right for you in a day to lose a healthy amount of weight per week. A healthy amount of weight to lose per week  is usually 1-2 lb (0.5-0.9 kg). A dietitian can determine how many calories you need in a day and give you suggestions on how to reach your calorie goal.  WHAT IS MY MY PLAN? My goal is to have __________ calories per day.  If I have this many calories per day, I should lose around __________ pounds per week. WHAT DO I NEED TO KNOW ABOUT CALORIE COUNTING? In order to meet your daily calorie goal, you will need to:  Find out how many calories are in each food you would like to eat. Try to do this before you eat.  Decide how much of the food you can eat.  Write down what you ate and how many calories it had. Doing this is called keeping a food log. WHERE DO I FIND CALORIE INFORMATION? The number of calories in a food can be found on a Nutrition Facts label. Note that all the information on a label is based on a specific serving of the food. If a food does not have a Nutrition Facts label, try to look up the calories  online or ask your dietitian for help. HOW DO I DECIDE HOW MUCH TO EAT? To decide how much of the food you can eat, you will need to consider both the number of calories in one serving and the size of one serving. This information can be found on the Nutrition Facts label. If a food does not have a Nutrition Facts label, look up the information online or ask your dietitian for help. Remember that calories are listed per serving. If you choose to have more than one serving of a food, you will have to multiply the calories per serving by the amount of servings you plan to eat. For example, the label on a package of bread might say that a serving size is 1 slice and that there are 90 calories in a serving. If you eat 1 slice, you will have eaten 90 calories. If you eat 2 slices, you will have eaten 180 calories. HOW DO I KEEP A FOOD LOG? After each meal, record the following information in your food log:  What you ate.  How much of it you ate.  How many calories it had.  Then, add up  your calories. Keep your food log near you, such as in a small notebook in your pocket. Another option is to use a mobile app or website. Some programs will calculate calories for you and show you how many calories you have left each time you add an item to the log. WHAT ARE SOME CALORIE COUNTING TIPS?  Use your calories on foods and drinks that will fill you up and not leave you hungry. Some examples of this include foods like nuts and nut butters, vegetables, lean proteins, and high-fiber foods (more than 5 g fiber per serving).  Eat nutritious foods and avoid empty calories. Empty calories are calories you get from foods or beverages that do not have many nutrients, such as candy and soda. It is better to have a nutritious high-calorie food (such as an avocado) than a food with few nutrients (such as a bag of chips).  Know how many calories are in the foods you eat most often. This way, you do not have to look up how many calories they have each time you eat them.  Look out for foods that may seem like low-calorie foods but are really high-calorie foods, such as baked goods, soda, and fat-free candy.  Pay attention to calories in drinks. Drinks such as sodas, specialty coffee drinks, alcohol, and juices have a lot of calories yet do not fill you up. Choose low-calorie drinks like water and diet drinks.  Focus your calorie counting efforts on higher calorie items. Logging the calories in a garden salad that contains only vegetables is less important than calculating the calories in a milk shake.  Find a way of tracking calories that works for you. Get creative. Most people who are successful find ways to keep track of how much they eat in a day, even if they do not count every calorie. WHAT ARE SOME PORTION CONTROL TIPS?  Know how many calories are in a serving. This will help you know how many servings of a certain food you can have.  Use a measuring cup to measure serving sizes. This is  helpful when you start out. With time, you will be able to estimate serving sizes for some foods.  Take some time to put servings of different foods on your favorite plates, bowls, and cups so you know what a  serving looks like.  Try not to eat straight from a bag or box. Doing this can lead to overeating. Put the amount you would like to eat in a cup or on a plate to make sure you are eating the right portion.  Use smaller plates, glasses, and bowls to prevent overeating. This is a quick and easy way to practice portion control. If your plate is smaller, less food can fit on it.  Try not to multitask while eating, such as watching TV or using your computer. If it is time to eat, sit down at a table and enjoy your food. Doing this will help you to start recognizing when you are full. It will also make you more aware of what and how much you are eating. HOW CAN I CALORIE COUNT WHEN EATING OUT?  Ask for smaller portion sizes or child-sized portions.  Consider sharing an entree and sides instead of getting your own entree.  If you get your own entree, eat only half. Ask for a box at the beginning of your meal and put the rest of your entree in it so you are not tempted to eat it.  Look for the calories on the menu. If calories are listed, choose the lower calorie options.  Choose dishes that include vegetables, fruits, whole grains, low-fat dairy products, and lean protein. Focusing on smart food choices from each of the 5 food groups can help you stay on track at restaurants.  Choose items that are boiled, broiled, grilled, or steamed.  Choose water, milk, unsweetened iced tea, or other drinks without added sugars. If you want an alcoholic beverage, choose a lower calorie option. For example, a regular margarita can have up to 700 calories and a glass of wine has around 150.  Stay away from items that are buttered, battered, fried, or served with cream sauce. Items labeled "crispy" are usually  fried, unless stated otherwise.  Ask for dressings, sauces, and syrups on the side. These are usually very high in calories, so do not eat much of them.  Watch out for salads. Many people think salads are a healthy option, but this is often not the case. Many salads come with bacon, fried chicken, lots of cheese, fried chips, and dressing. All of these items have a lot of calories. If you want a salad, choose a garden salad and ask for grilled meats or steak. Ask for the dressing on the side, or ask for olive oil and vinegar or lemon to use as dressing.  Estimate how many servings of a food you are given. For example, a serving of cooked rice is  cup or about the size of half a tennis ball or one cupcake wrapper. Knowing serving sizes will help you be aware of how much food you are eating at restaurants. The list below tells you how big or small some common portion sizes are based on everyday objects.  1 oz--4 stacked dice.  3 oz--1 deck of cards.  1 tsp--1 dice.  1 Tbsp-- a Ping-Pong ball.  2 Tbsp--1 Ping-Pong ball.   cup--1 tennis ball or 1 cupcake wrapper.  1 cup--1 baseball. Document Released: 07/05/2005 Document Revised: 11/19/2013 Document Reviewed: 05/10/2013 [facility] Patient Information 2015 [place], [place]. This information is not intended to replace advice given to you by your health care provider. Make sure you discuss any questions you have with your health care provider.

## 2014-06-06 NOTE — Telephone Encounter (Signed)
Pt called. Said we RF'ed glipizide for him but not victoza today. Deleted Glipizide from his list and ordered the Victoza for him with 11 RF (all of his other meds were RF'ed for a yr)

## 2014-06-06 NOTE — Progress Notes (Signed)
   Subjective:    Patient ID: Brandon Rich, male    DOB: May 14, 1961, 53 y.o.   MRN: [id number]  HPI Doing well no problems. 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 Has not gained weight Endocrinologist is happy with his regimen and progress. Last colonoscopy 2005  Review of Systems  Constitutional: Negative.   HENT: Negative.   Eyes: Negative.   Respiratory: Negative.   Cardiovascular: Negative.   Gastrointestinal: Negative.   Endocrine: Negative.   Genitourinary: Negative.   Musculoskeletal: Negative.   Skin: Negative.   Allergic/Immunologic: Negative.   Neurological: Negative.   Hematological: Negative.   Psychiatric/Behavioral: Negative.        Objective:   Physical Exam  Constitutional: He is oriented to person, place, and time. He appears well-developed and well-nourished. No distress.  HENT:  Head: Normocephalic.  Right Ear: External ear normal.  Left Ear: External ear normal.  Nose: Nose normal.  Mouth/Throat: Oropharynx is clear and moist.  Eyes: Conjunctivae and EOM are normal. Pupils are equal, round, and reactive to light.  Neck: Normal range of motion. Neck supple. No tracheal deviation present. No thyromegaly present.  Cardiovascular: Normal rate, regular rhythm, normal heart sounds and intact distal pulses.   Pulmonary/Chest: Effort normal and breath sounds normal.  Abdominal: Bowel sounds are normal. He exhibits no mass.  Genitourinary: Rectum normal, prostate normal and penis normal.  Musculoskeletal: Normal range of motion.  Lymphadenopathy:    He has no cervical adenopathy.  Neurological: He is alert and oriented to person, place, and time. He has normal strength and normal reflexes. No cranial nerve deficit or sensory deficit. He exhibits normal muscle tone. He displays a negative Romberg sign. Coordination and gait normal.  Psychiatric: He has a normal mood and affect. His behavior is normal. Judgment and thought content normal.    Results for orders placed or performed in visit on 06/06/14  POCT glucose (manual entry)  Result Value Ref Range   POC Glucose 101 (A) 70 - 99 mg/dl  POCT glycosylated hemoglobin (Hb A1C)  Result Value Ref Range   Hemoglobin A1C 7.2   POCT UA - Microscopic Only  Result Value Ref Range   WBC, Ur, HPF, POC neg    RBC, urine, microscopic 0-1    Bacteria, U Microscopic neg    Mucus, UA neg    Epithelial cells, urine per micros 0-1    Crystals, Ur, HPF, POC neg    Casts, Ur, LPF, POC neg    Yeast, UA neg   POCT urinalysis dipstick  Result Value Ref Range   Color, UA yellow    Clarity, UA clear    Glucose, UA 500    Bilirubin, UA neg    Ketones, UA 40    Spec Grav, UA 1.025    Blood, UA neg    pH, UA 5.0    Protein, UA neg    Urobilinogen, UA 0.2    Nitrite, UA neg    Leukocytes, UA Negative    EKG stable compare       Assessment & Plan:  Schedule colonoscopy See opthalmology RF all meds Pneumovax booster Give Prenar in 1 year. Lose 25 lbs RTC 6 months

## 2014-06-07 LAB — PSA: PSA: 1.08 ng/mL (ref <=4.00)

## 2014-06-08 ENCOUNTER — Encounter: Payer: Self-pay | Admitting: *Deleted

## 2014-06-17 ENCOUNTER — Other Ambulatory Visit: Payer: Self-pay | Admitting: Internal Medicine

## 2014-06-27 ENCOUNTER — Telehealth: Payer: Self-pay

## 2014-06-27 NOTE — Telephone Encounter (Signed)
Received order for insulin needles from [facility]. Put in Dr Luiz Ochoa box for signature.

## 2014-07-08 ENCOUNTER — Other Ambulatory Visit: Payer: Self-pay | Admitting: Internal Medicine

## 2014-07-30 ENCOUNTER — Encounter: Payer: Self-pay | Admitting: Internal Medicine

## 2014-09-19 ENCOUNTER — Other Ambulatory Visit: Payer: Self-pay | Admitting: Physician Assistant

## 2014-11-18 LAB — HM DIABETES EYE EXAM

## 2014-12-03 ENCOUNTER — Encounter: Payer: Self-pay | Admitting: Internal Medicine

## 2014-12-19 ENCOUNTER — Other Ambulatory Visit: Payer: Self-pay | Admitting: Internal Medicine

## 2015-01-13 ENCOUNTER — Other Ambulatory Visit: Payer: Self-pay

## 2015-01-31 ENCOUNTER — Other Ambulatory Visit: Payer: Self-pay | Admitting: Internal Medicine

## 2015-06-16 ENCOUNTER — Ambulatory Visit (INDEPENDENT_AMBULATORY_CARE_PROVIDER_SITE_OTHER): Payer: BLUE CROSS/BLUE SHIELD | Admitting: Internal Medicine

## 2015-06-16 VITALS — BP 126/79 | HR 77 | Temp 98.5°F | Resp 18 | Ht 70.0 in | Wt 275.0 lb

## 2015-06-16 DIAGNOSIS — Z0000 Encounter for general adult medical examination without abnormal findings: Secondary | ICD-10-CM

## 2015-06-16 DIAGNOSIS — J069 Acute upper respiratory infection, unspecified: Secondary | ICD-10-CM | POA: Diagnosis not present

## 2015-06-16 DIAGNOSIS — E785 Hyperlipidemia, unspecified: Secondary | ICD-10-CM

## 2015-06-16 DIAGNOSIS — R2241 Localized swelling, mass and lump, right lower limb: Secondary | ICD-10-CM

## 2015-06-16 DIAGNOSIS — I10 Essential (primary) hypertension: Secondary | ICD-10-CM

## 2015-06-16 DIAGNOSIS — Z23 Encounter for immunization: Secondary | ICD-10-CM

## 2015-06-16 DIAGNOSIS — Z8042 Family history of malignant neoplasm of prostate: Secondary | ICD-10-CM

## 2015-06-16 DIAGNOSIS — E1140 Type 2 diabetes mellitus with diabetic neuropathy, unspecified: Secondary | ICD-10-CM

## 2015-06-16 DIAGNOSIS — Z7189 Other specified counseling: Secondary | ICD-10-CM

## 2015-06-16 DIAGNOSIS — E1169 Type 2 diabetes mellitus with other specified complication: Secondary | ICD-10-CM | POA: Diagnosis not present

## 2015-06-16 DIAGNOSIS — E119 Type 2 diabetes mellitus without complications: Secondary | ICD-10-CM

## 2015-06-16 LAB — POCT URINALYSIS DIP (MANUAL ENTRY)
BILIRUBIN UA: NEGATIVE
Blood, UA: NEGATIVE
LEUKOCYTES UA: NEGATIVE
Nitrite, UA: NEGATIVE
PH UA: 5
Protein Ur, POC: NEGATIVE
Spec Grav, UA: 1.02
Urobilinogen, UA: 0.2

## 2015-06-16 LAB — POCT CBC
Granulocyte percent: 72.6 %G (ref 37–80)
HCT, POC: 46.9 % (ref 43.5–53.7)
Hemoglobin: 16.2 g/dL (ref 14.1–18.1)
Lymph, poc: 1.9 (ref 0.6–3.4)
MCH: 29.3 pg (ref 27–31.2)
MCHC: 34.5 g/dL (ref 31.8–35.4)
MCV: 84.8 fL (ref 80–97)
MID (CBC): 0.4 (ref 0–0.9)
MPV: 6.4 fL (ref 0–99.8)
POC Granulocyte: 5.9 (ref 2–6.9)
POC LYMPH %: 22.9 % (ref 10–50)
POC MID %: 4.5 % (ref 0–12)
Platelet Count, POC: 266 10*3/uL (ref 142–424)
RBC: 5.53 M/uL (ref 4.69–6.13)
RDW, POC: 13.9 %
WBC: 8.1 10*3/uL (ref 4.6–10.2)

## 2015-06-16 LAB — GLUCOSE, POCT (MANUAL RESULT ENTRY): POC GLUCOSE: 120 mg/dL — AB (ref 70–99)

## 2015-06-16 LAB — COMPREHENSIVE METABOLIC PANEL
ALBUMIN: 4.7 g/dL (ref 3.6–5.1)
ALT: 19 U/L (ref 9–46)
AST: 40 U/L — AB (ref 10–35)
Alkaline Phosphatase: 72 U/L (ref 40–115)
BILIRUBIN TOTAL: 0.8 mg/dL (ref 0.2–1.2)
BUN: 15 mg/dL (ref 7–25)
CO2: 22 mmol/L (ref 20–31)
CREATININE: 0.73 mg/dL (ref 0.70–1.33)
Calcium: 9.5 mg/dL (ref 8.6–10.3)
Chloride: 103 mmol/L (ref 98–110)
GLUCOSE: 111 mg/dL — AB (ref 65–99)
Potassium: 4.1 mmol/L (ref 3.5–5.3)
SODIUM: 136 mmol/L (ref 135–146)
Total Protein: 7.3 g/dL (ref 6.1–8.1)

## 2015-06-16 LAB — LIPID PANEL
CHOL/HDL RATIO: 3.4 ratio (ref <=5.0)
Cholesterol: 101 mg/dL — ABNORMAL LOW (ref 125–200)
HDL: 30 mg/dL — AB (ref >=40)
LDL CALC: 39 mg/dL (ref <130)
Triglycerides: 160 mg/dL — ABNORMAL HIGH (ref <150)
VLDL: 32 mg/dL — AB (ref <30)

## 2015-06-16 LAB — POCT GLYCOSYLATED HEMOGLOBIN (HGB A1C): HEMOGLOBIN A1C: 7.4

## 2015-06-16 LAB — LIPASE: LIPASE: 53 U/L (ref 7–60)

## 2015-06-16 LAB — HEPATITIS C ANTIBODY: HCV Ab: NEGATIVE

## 2015-06-16 LAB — POC MICROSCOPIC URINALYSIS (UMFC): MUCUS RE: ABSENT

## 2015-06-16 LAB — MICROALBUMIN, URINE: Microalb, Ur: 4.5 mg/dL

## 2015-06-16 LAB — IFOBT (OCCULT BLOOD): IMMUNOLOGICAL FECAL OCCULT BLOOD TEST: NEGATIVE

## 2015-06-16 LAB — TSH: TSH: 1.034 u[IU]/mL (ref 0.350–4.500)

## 2015-06-16 MED ORDER — ZOSTER VACCINE LIVE 19400 UNT/0.65ML ~~LOC~~ SOLR: 0.6500 mL | Freq: Once | SUBCUTANEOUS | Status: DC

## 2015-06-16 MED ORDER — METFORMIN HCL 1000 MG PO TABS: 1000.0000 mg | ORAL_TABLET | Freq: Two times a day (BID) | ORAL | Status: DC

## 2015-06-16 MED ORDER — CANAGLIFLOZIN 300 MG PO TABS
300.0000 mg | ORAL_TABLET | Freq: Every day | ORAL | Status: DC
Start: 2015-06-16 — End: 2016-06-03

## 2015-06-16 MED ORDER — ATORVASTATIN CALCIUM 20 MG PO TABS: 20.0000 mg | ORAL_TABLET | Freq: Every day | ORAL | Status: DC

## 2015-06-16 MED ORDER — LISINOPRIL 20 MG PO TABS: 20.0000 mg | ORAL_TABLET | Freq: Every day | ORAL | Status: DC

## 2015-06-16 MED ORDER — LIRAGLUTIDE 18 MG/3ML ~~LOC~~ SOPN: 1.8000 mg | PEN_INJECTOR | Freq: Every day | SUBCUTANEOUS | Status: DC

## 2015-06-16 NOTE — Patient Instructions (Addendum)
Immunization Schedule, Adult  Influenza vaccine.  All adults should be immunized every year.  All adults, including pregnant women and people with hives-only allergy to eggs can receive the inactivated influenza (IIV) vaccine.  Adults aged 55-49 years can receive the recombinant influenza (RIV) vaccine. The RIV vaccine does not contain any egg protein.  Adults aged 76 years or older can receive the standard-dose IIV or the high-dose IIV.  Tetanus, diphtheria, and acellular pertussis (Td, Tdap) vaccine.  Pregnant women should receive 1 dose of Tdap vaccine during each pregnancy. The dose should be obtained regardless of the length of time since the last dose. Immunization is preferred during the 27th to 36th week of gestation.  An adult who has not previously received Tdap or who does not know his or her vaccine status should receive 1 dose of Tdap. This initial dose should be followed by tetanus and diphtheria toxoids (Td) booster doses every 10 years.  Adults with an unknown or incomplete history of completing a 3-dose immunization series with Td-containing vaccines should begin or complete a primary immunization series including a Tdap dose.  Adults should receive a Td booster every 10 years.  Varicella vaccine.  An adult without evidence of immunity to varicella should receive 2 doses or a second dose if he or she has previously received 1 dose.  Pregnant females who do not have evidence of immunity should receive the first dose after pregnancy. This first dose should be obtained before leaving the health care facility. The second dose should be obtained 4-8 weeks after the first dose.  Human papillomavirus (HPV) vaccine.  Females aged 13-26 years who have not received the vaccine previously should obtain the 3-dose series.  The vaccine is not recommended for use in pregnant females. However, pregnancy testing is not needed before receiving a dose. If a male is found to be  pregnant after receiving a dose, no treatment is needed. In that case, the remaining doses should be delayed until after the pregnancy.  Males aged 37-21 years who have not received the vaccine previously should receive the 3-dose series. Males aged 22-26 years may be immunized.  Immunization is recommended through the age of 93 years for any male who has sex with males and did not get any or all doses earlier.  Immunization is recommended for any person with an immunocompromised condition through the age of 71 years if he or she did not get any or all doses earlier.  During the 3-dose series, the second dose should be obtained 4-8 weeks after the first dose. The third dose should be obtained 24 weeks after the first dose and 16 weeks after the second dose.  Zoster vaccine.  One dose is recommended for adults aged 73 years or older unless certain conditions are present.  Measles, mumps, and rubella (MMR) vaccine.  Adults born before 35 generally are considered immune to measles and mumps.  Adults born in [birth year] or later should have 1 or more doses of MMR vaccine unless there is a contraindication to the vaccine or there is laboratory evidence of immunity to each of the three diseases.  A routine second dose of MMR vaccine should be obtained at least 28 days after the first dose for students attending postsecondary schools, health care workers, or international travelers.  People who received inactivated measles vaccine or an unknown type of measles vaccine during 1963-1967 should receive 2 doses of MMR vaccine.  People who received inactivated mumps vaccine or an unknown type  of mumps vaccine before 1979 and are at high risk for mumps infection should consider immunization with 2 doses of MMR vaccine.  For females of childbearing age, rubella immunity should be determined. If there is no evidence of immunity, females who are not pregnant should be vaccinated. If there is no evidence of  immunity, females who are pregnant should delay immunization until after pregnancy.  Unvaccinated health care workers born before 1957 who lack laboratory evidence of measles, mumps, or rubella immunity or laboratory confirmation of disease should consider measles and mumps immunization with 2 doses of MMR vaccine or rubella immunization with 1 dose of MMR vaccine.  Pneumococcal 13-valent conjugate (PCV13) vaccine.  When indicated, a person who is uncertain of his or her immunization history and has no record of immunization should receive the PCV13 vaccine.  An adult aged 19 years or older who has certain medical conditions and has not been previously immunized should receive 1 dose of PCV13 vaccine. This PCV13 should be followed with a dose of pneumococcal polysaccharide (PPSV23) vaccine. The PPSV23 vaccine dose should be obtained at least 8 weeks after the dose of PCV13 vaccine.  An adult aged 19 years or older who has certain medical conditions and previously received 1 or more doses of PPSV23 vaccine should receive 1 dose of PCV13. The PCV13 vaccine dose should be obtained 1 or more years after the last PPSV23 vaccine dose.  Pneumococcal polysaccharide (PPSV23) vaccine.  When PCV13 is also indicated, PCV13 should be obtained first.  All adults aged 65 years and older should be immunized.  An adult younger than age 65 years who has certain medical conditions should be immunized.  Any person who resides in a nursing home or long-term care facility should be immunized.  An adult smoker should be immunized.  People with an immunocompromised condition and certain other conditions should receive both PCV13 and PPSV23 vaccines.  People with human immunodeficiency virus (HIV) infection should be immunized as soon as possible after diagnosis.  Immunization during chemotherapy or radiation therapy should be avoided.  Routine use of PPSV23 vaccine is not recommended for American Indians,  [place]atives, or people younger than 65 years unless there are medical conditions that require PPSV23 vaccine.  When indicated, people who have unknown immunization and have no record of immunization should receive PPSV23 vaccine.  One-time revaccination 5 years after the first dose of PPSV23 is recommended for people aged 19-64 years who have chronic kidney failure, nephrotic syndrome, asplenia, or immunocompromised conditions.  People who received 1-2 doses of PPSV23 before age 65 years should receive another dose of PPSV23 vaccine at age 65 years or later if at least 5 years have passed since the previous dose.  Doses of PPSV23 are not needed for people immunized with PPSV23 at or after age 65 years.  Meningococcal vaccine.  Adults with asplenia or persistent complement component deficiencies should receive 2 doses of quadrivalent meningococcal conjugate (MenACWY-D) vaccine. The doses should be obtained at least 2 months apart.  Microbiologists working with certain meningococcal bacteria, military recruits, people at risk during an outbreak, and people who travel to or live in countries with a high rate of meningitis should be immunized.  A first-year college student up through age 21 years who is living in a residence hall should receive a dose if he or she did not receive a dose on or after his or her 16th birthday.  Adults who have certain high-risk conditions should receive one or more doses   of vaccine.  Hepatitis A vaccine.  Adults who wish to be protected from this disease, have certain high-risk conditions, work with hepatitis A-infected animals, work in hepatitis A research labs, or travel to or work in countries with a high rate of hepatitis A should be immunized.  Adults who were previously unvaccinated and who anticipate close contact with an international adoptee during the first 60 days after arrival in the [place] from a country with a high rate of hepatitis A should  be immunized.  Hepatitis B vaccine.  Adults who wish to be protected from this disease, have certain high-risk conditions, may be exposed to blood or other infectious body fluids, are household contacts or sex partners of hepatitis B positive people, are clients or workers in certain care facilities, or travel to or work in countries with a high rate of hepatitis B should be immunized.  Haemophilus influenzae type b (Hib) vaccine.  A previously unvaccinated person with asplenia or sickle cell disease or having a scheduled splenectomy should receive 1 dose of Hib vaccine.  Regardless of previous immunization, a recipient of a hematopoietic stem cell transplant should receive a 3-dose series 6-12 months after his or her successful transplant.  Hib vaccine is not recommended for adults with HIV infection.   This information is not intended to replace advice given to you by your health care provider. Make sure you discuss any questions you have with your health care provider.   Document Released: 09/25/2003 Document Revised: 10/30/2012 Document Reviewed: 08/22/2012 Elsevier Interactive Patient Education 2016 [employer] Diabetic Neuropathy Diabetic neuropathy is a nerve disease or nerve damage that is caused by diabetes mellitus. About half of all people with diabetes mellitus have some form of nerve damage. Nerve damage is more common in those who have had diabetes mellitus for many years and who generally have not had good control of their blood sugar (glucose) level. Diabetic neuropathy is a common complication of diabetes mellitus. There are three common types of diabetic neuropathy and a fourth type that is less common and less understood:   Peripheral neuropathy--This is the most common type of diabetic neuropathy. It causes damage to the nerves of the feet and legs first and then eventually the hands and arms.The damage affects the ability to sense touch.  Autonomic neuropathy--This  type causes damage to the autonomic nervous system, which controls the following functions:  Heartbeat.  Body temperature.  Blood pressure.  Urination.  Digestion.  Sweating.  Sexual function.  Focal neuropathy--Focal neuropathy can be painful and unpredictable and occurs most often in older adults with diabetes mellitus. It involves a specific nerve or one area and often comes on suddenly. It usually does not cause long-term problems.  Radiculoplexus neuropathy-- Sometimes called lumbosacral radiculoplexus neuropathy, radiculoplexus neuropathy affects the nerves of the thighs, hips, buttocks, or legs. It is more common in people with type 2 diabetes mellitus and in older men. It is characterized by debilitating pain, weakness, and atrophy, usually in the thigh muscles. CAUSES  The cause of peripheral, autonomic, and focal neuropathies is diabetes mellitus that is uncontrolled and high glucose levels. The cause of radiculoplexus neuropathy is unknown. However, it is thought to be caused by inflammation related to uncontrolled glucose levels. SIGNS AND SYMPTOMS  Peripheral Neuropathy Peripheral neuropathy develops slowly over time. When the nerves of the feet and legs no longer work there may be:   Burning, stabbing, or aching pain in the legs or feet.  Inability to feel pressure  or pain in your feet. This can lead to:  Thick calluses over pressure areas.  Pressure sores.  Ulcers.  Foot deformities.  Reduced ability to feel temperature changes.  Muscle weakness. Autonomic Neuropathy The symptoms of autonomic neuropathy vary depending on which nerves are affected. Symptoms may include:  Problems with digestion, such as:  Feeling sick to your stomach (nausea).  Vomiting.  Bloating.  Constipation.  Diarrhea.  Abdominal pain.  Difficulty with urination. This occurs if you lose your ability to sense when your bladder is full. Problems include:  Urine leakage  (incontinence).  Inability to empty your bladder completely (retention).  Rapid or irregular heartbeat (palpitations).  Blood pressure drops when you stand up (orthostatic hypotension). When you stand up you may feel:  Dizzy.  Weak.  Faint.  In men, inability to attain and maintain an erection.  In women, vaginal dryness and problems with decreased sexual desire and arousal.  Problems with body temperature regulation.  Increased or decreased sweating. Focal Neuropathy  Abnormal eye movements or abnormal alignment of both eyes.  Weakness in the wrist.  Foot drop. This results in an inability to lift the foot properly and abnormal walking or foot movement.  Paralysis on one side of your face (Bell palsy).  Chest or abdominal pain. Radiculoplexus Neuropathy  Sudden, severe pain in your hip, thigh, or buttocks.  Weakness and wasting of thigh muscles.  Difficulty rising from a seated position.  Abdominal swelling.  Unexplained weight loss (usually more than 10 lb [4.5 kg]). DIAGNOSIS  Peripheral Neuropathy Your senses may be tested. Sensory function testing can be done with:  A light touch using a monofilament.  A vibration with tuning fork.  A sharp sensation with a pin prick. Other tests that can help diagnose neuropathy are:  Nerve conduction velocity. This test checks the transmission of an electrical current through a nerve.  Electromyography. This shows how muscles respond to electrical signals transmitted by nearby nerves.  Quantitative sensory testing. This is used to assess how your nerves respond to vibrations and changes in temperature. Autonomic Neuropathy Diagnosis is often based on reported symptoms. Tell your health care provider if you experience:   Dizziness.   Constipation.   Diarrhea.   Inappropriate urination or inability to urinate.   Inability to get or maintain an erection.  Tests that may be done include:    Electrocardiography or Holter monitor. These are tests that can help show problems with the heart rate or heart rhythm.   An X-ray exam may be done. Focal Neuropathy Diagnosis is made based on your symptoms and what your health care provider finds during your exam. Other tests may be done. They may include:  Nerve conduction velocities. This checks the transmission of electrical current through a nerve.  Electromyography. This shows how muscles respond to electrical signals transmitted by nearby nerves.  Quantitative sensory testing. This test is used to assess how your nerves respond to vibration and changes in temperature. Radiculoplexus Neuropathy  Often the first thing is to eliminate any other issue or problems that might be the cause, as there is no stick test for diagnosis.  X-ray exam of your spine and lumbar region.  Spinal tap to rule out cancer.  MRI to rule out other lesions. TREATMENT  Once nerve damage occurs, it cannot be reversed. The goal of treatment is to keep the disease or nerve damage from getting worse and affecting more nerve fibers. Controlling your blood glucose level is the key. Most  people with radiculoplexus neuropathy see at least a partial improvement over time. You will need to keep your blood glucose and HbA1c levels in the target range determined by your health care provider. Things that help control blood glucose levels include:   Blood glucose monitoring.   Meal planning.   Physical activity.   Diabetes medicine.  Over time, maintaining lower blood glucose levels helps lessen symptoms. Sometimes, prescription pain medicine is needed. HOME CARE INSTRUCTIONS:  Do not smoke.  Keep your blood glucose level in the range that you and your health care provider have determined acceptable for you.  Keep your blood pressure level in the range that you and your health care provider have determined acceptable for you.  Eat a well-balanced  diet.  Be physically active every day. Include strength training and balance exercises.  Protect your feet.  Check your feet every day for sores, cuts, blisters, or signs of infection.  Wear padded socks and supportive shoes. Use orthotic inserts, if necessary.  Regularly check the insides of your shoes for worn spots. Make sure there are no rocks or other items inside your shoes before you put them on. SEEK MEDICAL CARE IF:   You have burning, stabbing, or aching pain in the legs or feet.  You are unable to feel pressure or pain in your feet.  You develop problems with digestion such as:  Nausea.  Vomiting.  Bloating.  Constipation.  Diarrhea.  Abdominal pain.  You have difficulty with urination, such as:  Incontinence.  Retention.  You have palpitations.  You develop orthostatic hypotension. When you stand up you may feel:  Dizzy.  Weak.  Faint.  You cannot attain and maintain an erection (in men).  You have vaginal dryness and problems with decreased sexual desire and arousal (in women).  You have severe pain in your thighs, legs, or buttocks.  You have unexplained weight loss.   This information is not intended to replace advice given to you by your health care provider. Make sure you discuss any questions you have with your health care provider.   Document Released: 09/13/2001 Document Revised: 07/26/2014 Document Reviewed: 12/14/2012 Elsevier Interactive Patient Education 2016 [employer]. Influenza Virus Vaccine injection What is this medicine? INFLUENZA VIRUS VACCINE (in floo EN zuh VAHY ruhs vak SEEN) helps to reduce the risk of getting influenza also known as the flu. The vaccine only helps protect you against some strains of the flu. This medicine may be used for other purposes; ask your health care provider or pharmacist if you have questions. What should I tell my health care provider before I take this medicine? They need to know if you  have any of these conditions: -bleeding disorder like hemophilia -fever or infection -Guillain-Barre syndrome or other neurological problems -immune system problems -infection with the human immunodeficiency virus (HIV) or AIDS -low blood platelet counts -multiple sclerosis -an unusual or allergic reaction to influenza virus vaccine, latex, other medicines, foods, dyes, or preservatives. Different brands of vaccines contain different allergens. Some may contain latex or eggs. Talk to your doctor about your allergies to make sure that you get the right vaccine. -pregnant or trying to get pregnant -breast-feeding How should I use this medicine? This vaccine is for injection into a muscle or under the skin. It is given by a health care professional. A copy of Vaccine Information Statements will be given before each vaccination. Read this sheet carefully each time. The sheet may change frequently. Talk to your healthcare  provider to see which vaccines are right for you. Some vaccines should not be used in all age groups. Overdosage: If you think you have taken too much of this medicine contact a poison control center or emergency room at once. NOTE: This medicine is only for you. Do not share this medicine with others. What if I miss a dose? This does not apply. What may interact with this medicine? -chemotherapy or radiation therapy -medicines that lower your immune system like etanercept, anakinra, infliximab, and adalimumab -medicines that treat or prevent blood clots like warfarin -phenytoin -steroid medicines like prednisone or cortisone -theophylline -vaccines This list may not describe all possible interactions. Give your health care provider a list of all the medicines, herbs, non-prescription drugs, or dietary supplements you use. Also tell them if you smoke, drink alcohol, or use illegal drugs. Some items may interact with your medicine. What should I watch for while using this  medicine? Report any side effects that do not go away within 3 days to your doctor or health care professional. Call your health care provider if any unusual symptoms occur within 6 weeks of receiving this vaccine. You may still catch the flu, but the illness is not usually as bad. You cannot get the flu from the vaccine. The vaccine will not protect against colds or other illnesses that may cause fever. The vaccine is needed every year. What side effects may I notice from receiving this medicine? Side effects that you should report to your doctor or health care professional as soon as possible: -allergic reactions like skin rash, itching or hives, swelling of the face, lips, or tongue Side effects that usually do not require medical attention (report to your doctor or health care professional if they continue or are bothersome): -fever -headache -muscle aches and pains -pain, tenderness, redness, or swelling at the injection site -tiredness This list may not describe all possible side effects. Call your doctor for medical advice about side effects. You may report side effects to FDA at 1-800-FDA-1088. Where should I keep my medicine? The vaccine will be given by a health care professional in a clinic, pharmacy, doctor's office, or other health care setting. You will not be given vaccine doses to store at home. NOTE: This sheet is a summary. It may not cover all possible information. If you have questions about this medicine, talk to your doctor, pharmacist, or health care provider.    2016, Elsevier/[employer]. (2015-01-24 10:07:28) Pneumococcal Vaccine, Polyvalent suspension for injection What is this medicine? PNEUMOCOCCAL VACCINE (NEU mo KOK al vak SEEN) is a vaccine used to prevent pneumococcus bacterial infections. These bacteria can cause serious infections like pneumonia, meningitis, and blood infections. This vaccine will lower your chance of getting pneumonia. If you do get  pneumonia, it can make your symptoms milder and your illness shorter. This vaccine will not treat an infection and will not cause infection. This vaccine is recommended for infants and young children, adults with certain medical conditions, and adults 18 years or older. This medicine may be used for other purposes; ask your health care provider or pharmacist if you have questions. What should I tell my health care provider before I take this medicine? They need to know if you have any of these conditions: -bleeding problems -fever -immune system problems -an unusual or allergic reaction to pneumococcal vaccine, diphtheria toxoid, other vaccines, latex, other medicines, foods, dyes, or preservatives -pregnant or trying to get pregnant -breast-feeding How should I use this medicine? This  vaccine is for injection into a muscle. It is given by a health care professional. A copy of Vaccine Information Statements will be given before each vaccination. Read this sheet carefully each time. The sheet may change frequently. Talk to your pediatrician regarding the use of this medicine in children. While this drug may be prescribed for children as young as 75 weeks old for selected conditions, precautions do apply. Overdosage: If you think you have taken too much of this medicine contact a poison control center or emergency room at once. NOTE: This medicine is only for you. Do not share this medicine with others. What if I miss a dose? It is important not to miss your dose. Call your doctor or health care professional if you are unable to keep an appointment. What may interact with this medicine? -medicines for cancer chemotherapy -medicines that suppress your immune function -steroid medicines like prednisone or cortisone This list may not describe all possible interactions. Give your health care provider a list of all the medicines, herbs, non-prescription drugs, or dietary supplements you use. Also tell  them if you smoke, drink alcohol, or use illegal drugs. Some items may interact with your medicine. What should I watch for while using this medicine? Mild fever and pain should go away in 3 days or less. Report any unusual symptoms to your doctor or health care professional. What side effects may I notice from receiving this medicine? Side effects that you should report to your doctor or health care professional as soon as possible: -allergic reactions like skin rash, itching or hives, swelling of the face, lips, or tongue -breathing problems -confused -fast or irregular heartbeat -fever over 102 degrees F -seizures -unusual bleeding or bruising -unusual muscle weakness Side effects that usually do not require medical attention (report to your doctor or health care professional if they continue or are bothersome): -aches and pains -diarrhea -fever of 102 degrees F or less -headache -irritable -loss of appetite -pain, tender at site where injected -trouble sleeping This list may not describe all possible side effects. Call your doctor for medical advice about side effects. You may report side effects to FDA at 1-800-FDA-1088. Where should I keep my medicine? This does not apply. This vaccine is given in a clinic, pharmacy, doctor's office, or other health care setting and will not be stored at home. NOTE: This sheet is a summary. It may not cover all possible information. If you have questions about this medicine, talk to your doctor, pharmacist, or health care provider.    2016, Elsevier/Gold Standard. (2014-04-11 10:27:27)

## 2015-06-16 NOTE — Progress Notes (Signed)
Patient ID: Brandon Rich, male   DOB: [birth year], 54 y.o.   MRN: [id number]   06/16/2015 at 10:07 AM  Brandon Rich / DOB: [birth year] / MRN: [id number]  Problem list reviewed and updated by me where necessary.   SUBJECTIVE  Brandon Rich is a 54 y.o. well appearing male presenting for the chief complaint of need annual exam.. Has controlled T2DM, HTN, and dyslipidemia. Tolerating all meds, was released to primary by endocrine. Agrees to find new primary care here next visit. Also needs vaccines and all reviewed.    He  has a past medical history of Hypertension; Hyperlipidemia; Diabetes mellitus; Glaucoma; and Allergy.    Medications reviewed and updated by myself where necessary, and exist elsewhere in the encounter.   Brandon Rich has No Known Allergies. He  reports that he has never smoked. He does not have any smokeless tobacco history on file. He reports that he does not drink alcohol or use illicit drugs. He  reports that he currently engages in sexual activity and has had male partners. The patient  has past surgical history that includes Hernia repair; Spine surgery; and Tumor removal.  His family history includes Cancer in his father and mother; Heart disease in his father, maternal grandfather, maternal grandmother, mother, and paternal grandfather; Hyperlipidemia in his maternal grandmother and paternal grandfather; Hypertension in his maternal grandmother, mother, and paternal grandfather; Skin cancer in his mother.  Review of Systems  Constitutional: Positive for malaise/fatigue. Negative for fever.  HENT: Positive for congestion and sore throat. Negative for ear discharge and hearing loss.   Eyes: Negative.   Respiratory: Negative.  Negative for shortness of breath.   Cardiovascular: Negative.  Negative for chest pain.  Gastrointestinal: Negative.  Negative for nausea.  Genitourinary: Negative.   Musculoskeletal: Positive for back pain.  Skin: Negative.  Negative  for rash.  Neurological: Positive for tingling and sensory change. Negative for dizziness, focal weakness and headaches.  Endo/Heme/Allergies: Negative.   Psychiatric/Behavioral: Negative.     OBJECTIVE  His  height is 5\' 10"  (1.778 m) and weight is 275 lb (124.739 kg). His oral temperature is 98.5 F (36.9 C). His blood pressure is 126/79 and his pulse is 77. His respiration is 18 and oxygen saturation is 97%.  The patient's body mass index is 39.46 kg/(m^2).  Physical Exam  Constitutional: He is oriented to person, place, and time. He appears well-developed and well-nourished. No distress.  HENT:  Head: Normocephalic.  Nose: Nose normal.  Eyes: Conjunctivae and EOM are normal.  Respiratory: Effort normal.  Neurological: He is alert and oriented to person, place, and time. He exhibits normal muscle tone. Coordination normal.  Psychiatric: He has a normal mood and affect.   EKG unchanged from 2015--normal  Results for orders placed or performed in visit on 06/16/15 (from the past 24 hour(s))  IFOBT POC (occult bld, rslt in office)     Status: None   Collection Time: 06/16/15 10:04 AM  Result Value Ref Range   IFOBT Negative   POCT CBC     Status: None   Collection Time: 06/16/15 10:04 AM  Result Value Ref Range   WBC 8.1 4.6 - 10.2 K/uL   Lymph, poc 1.9 0.6 - 3.4   POC LYMPH PERCENT 22.9 10 - 50 %L   MID (cbc) 0.4 0 - 0.9   POC MID % 4.5 0 - 12 %M   POC Granulocyte 5.9 2 - 6.9   Granulocyte percent 72.6 37 -  80 %G   RBC 5.53 4.69 - 6.13 M/uL   Hemoglobin 16.2 14.1 - 18.1 g/dL   HCT, POC 46.9 43.5 - 53.7 %   MCV 84.8 80 - 97 fL   MCH, POC 29.3 27 - 31.2 pg   MCHC 34.5 31.8 - 35.4 g/dL   RDW, POC 13.9 %   Platelet Count, POC 266 142 - 424 K/uL   MPV 6.4 0 - 99.8 fL  POCT glucose (manual entry)     Status: Abnormal   Collection Time: 06/16/15 10:04 AM  Result Value Ref Range   POC Glucose 120 (A) 70 - 99 mg/dl  POCT glycosylated hemoglobin (Hb A1C)     Status: None    Collection Time: 06/16/15 10:04 AM  Result Value Ref Range   Hemoglobin A1C 7.4     ASSESSMENT & PLAN  Brandon Rich was seen today for annual exam.  Diagnoses and all orders for this visit:  Type 2 diabetes mellitus with diabetic neuropathy, without long-term current use of insulin ([place]) -     IFOBT POC (occult bld, rslt in office) -     POCT CBC -     POCT glucose (manual entry) -     POCT glycosylated hemoglobin (Hb A1C) -     POCT urinalysis dipstick -     POCT Microscopic Urinalysis ([facility]) -     Comprehensive metabolic panel -     Hepatitis C antibody -     PSA -     Lipid panel -     TSH -     Lipase -     EKG 12-Lead -     Microalbumin, urine -     lisinopril (PRINIVIL,ZESTRIL) 20 MG tablet; Take 1 tablet (20 mg total) by mouth daily. -     Liraglutide (VICTOZA) 18 MG/3ML SOPN; Inject 0.3 mLs (1.8 mg total) into the skin daily. -     canagliflozin (INVOKANA) 300 MG TABS tablet; Take 300 mg by mouth daily before breakfast. -     atorvastatin (LIPITOR) 20 MG tablet; Take 1 tablet (20 mg total) by mouth daily.  Essential hypertension -     IFOBT POC (occult bld, rslt in office) -     POCT CBC -     POCT glucose (manual entry) -     POCT glycosylated hemoglobin (Hb A1C) -     POCT urinalysis dipstick -     POCT Microscopic Urinalysis ([facility]) -     Comprehensive metabolic panel -     Hepatitis C antibody -     PSA -     Lipid panel -     TSH -     Lipase -     EKG 12-Lead -     Microalbumin, urine -     metFORMIN (GLUCOPHAGE) 1000 MG tablet; Take 1 tablet (1,000 mg total) by mouth 2 (two) times daily with a meal. -     lisinopril (PRINIVIL,ZESTRIL) 20 MG tablet; Take 1 tablet (20 mg total) by mouth daily. -     atorvastatin (LIPITOR) 20 MG tablet; Take 1 tablet (20 mg total) by mouth daily.  Dyslipidemia -     IFOBT POC (occult bld, rslt in office) -     POCT CBC -     POCT glucose (manual entry) -     POCT glycosylated hemoglobin (Hb A1C) -     POCT urinalysis  dipstick -     POCT Microscopic Urinalysis ([facility]) -  Comprehensive metabolic panel -     Hepatitis C antibody -     PSA -     Lipid panel -     TSH -     Lipase -     EKG 12-Lead -     Microalbumin, urine -     atorvastatin (LIPITOR) 20 MG tablet; Take 1 tablet (20 mg total) by mouth daily.  Acute upper respiratory infection -     IFOBT POC (occult bld, rslt in office) -     POCT CBC -     POCT glucose (manual entry) -     POCT glycosylated hemoglobin (Hb A1C) -     POCT urinalysis dipstick -     POCT Microscopic Urinalysis ([facility]) -     Comprehensive metabolic panel -     Hepatitis C antibody -     PSA -     Lipid panel -     TSH -     Lipase -     EKG 12-Lead  Encounter for medication review and counseling -     IFOBT POC (occult bld, rslt in office) -     POCT CBC -     POCT glucose (manual entry) -     POCT glycosylated hemoglobin (Hb A1C) -     POCT urinalysis dipstick -     POCT Microscopic Urinalysis ([facility]) -     Comprehensive metabolic panel -     Hepatitis C antibody -     PSA -     Lipid panel -     TSH -     Lipase -     EKG 12-Lead  FHx: prostate cancer -     IFOBT POC (occult bld, rslt in office) -     POCT CBC -     POCT glucose (manual entry) -     POCT glycosylated hemoglobin (Hb A1C) -     POCT urinalysis dipstick -     POCT Microscopic Urinalysis ([facility]) -     Comprehensive metabolic panel -     Hepatitis C antibody -     PSA -     Lipid panel -     TSH -     Lipase -     EKG 12-Lead  Annual physical exam -     IFOBT POC (occult bld, rslt in office) -     POCT CBC -     POCT glucose (manual entry) -     POCT glycosylated hemoglobin (Hb A1C) -     POCT urinalysis dipstick -     POCT Microscopic Urinalysis ([facility]) -     Comprehensive metabolic panel -     Hepatitis C antibody -     PSA -     Lipid panel -     TSH -     Lipase -     EKG 12-Lead -     Microalbumin, urine -     Flu Vaccine QUAD 36+ mos IM -     Pneumococcal  conjugate vaccine 13-valent IM -     Cancel: Varicella-zoster vaccine subcutaneous -     Discontinue: zoster vaccine live, PF, (ZOSTAVAX) 16109 UNT/0.65ML injection; Inject 19,400 Units into the skin once. -     zoster vaccine live, PF, (ZOSTAVAX) 60454 UNT/0.65ML injection; Inject 19,400 Units into the skin once.  Needs flu shot -     IFOBT POC (occult bld, rslt in office) -  POCT CBC -     POCT glucose (manual entry) -     POCT glycosylated hemoglobin (Hb A1C) -     POCT urinalysis dipstick -     POCT Microscopic Urinalysis ([facility]) -     Comprehensive metabolic panel -     Hepatitis C antibody -     PSA -     Lipid panel -     TSH -     Lipase -     EKG 12-Lead -     Flu Vaccine QUAD 36+ mos IM  Need for pneumococcal vaccination -     Pneumococcal conjugate vaccine 13-valent IM  Need for shingles vaccine -     Cancel: Varicella-zoster vaccine subcutaneous -     Discontinue: zoster vaccine live, PF, (ZOSTAVAX) [id number] UNT/0.65ML injection; Inject 19,400 Units into the skin once. -     zoster vaccine live, PF, (ZOSTAVAX) 29562 UNT/0.65ML injection; Inject 19,400 Units into the skin once.  Routine general medical examination at a health care facility -     metFORMIN (GLUCOPHAGE) 1000 MG tablet; Take 1 tablet (1,000 mg total) by mouth 2 (two) times daily with a meal. -     atorvastatin (LIPITOR) 20 MG tablet; Take 1 tablet (20 mg total) by mouth daily.  Mass of foot or toe, right -     metFORMIN (GLUCOPHAGE) 1000 MG tablet; Take 1 tablet (1,000 mg total) by mouth 2 (two) times daily with a meal. -     atorvastatin (LIPITOR) 20 MG tablet; Take 1 tablet (20 mg total) by mouth daily.  Type 2 diabetes mellitus without complication, without long-term current use of insulin ([facility]) -     metFORMIN (GLUCOPHAGE) 1000 MG tablet; Take 1 tablet (1,000 mg total) by mouth 2 (two) times daily with a meal.  Hyperlipidemia -     metFORMIN (GLUCOPHAGE) 1000 MG tablet; Take 1 tablet (1,000 mg  total) by mouth 2 (two) times daily with a meal.  Hyperlipemia -     atorvastatin (LIPITOR) 20 MG tablet; Take 1 tablet (20 mg total) by mouth daily.  Hyperlipidemia associated with type 2 diabetes mellitus (H[facility]) -     atorvastatin (LIPITOR) 20 MG tablet; Take 1 tablet (20 mg total) by mouth daily.  Hyperlipidemia LDL goal <130 -     atorvastatin (LIPITOR) 20 MG tablet; Take 1 tablet (20 mg total) by mouth daily.  Needs colonoscopy and shingles vaccine ordered.

## 2015-06-17 LAB — PSA: PSA: 1.29 ng/mL (ref <=4.00)

## 2015-06-27 ENCOUNTER — Other Ambulatory Visit: Payer: Self-pay | Admitting: Internal Medicine

## 2015-10-27 ENCOUNTER — Other Ambulatory Visit: Payer: Self-pay | Admitting: Internal Medicine

## 2015-10-31 ENCOUNTER — Other Ambulatory Visit: Payer: Self-pay

## 2015-10-31 ENCOUNTER — Encounter: Payer: Self-pay | Admitting: Internal Medicine

## 2015-10-31 MED ORDER — LIRAGLUTIDE 18 MG/3ML ~~LOC~~ SOPN: PEN_INJECTOR | SUBCUTANEOUS | Status: DC

## 2016-01-07 LAB — HM DIABETES EYE EXAM

## 2016-01-09 ENCOUNTER — Ambulatory Visit (INDEPENDENT_AMBULATORY_CARE_PROVIDER_SITE_OTHER): Payer: BLUE CROSS/BLUE SHIELD | Admitting: Family Medicine

## 2016-01-09 VITALS — BP 140/80 | HR 89 | Temp 99.4°F | Resp 17 | Ht 70.0 in | Wt 274.0 lb

## 2016-01-09 DIAGNOSIS — R519 Headache, unspecified: Secondary | ICD-10-CM

## 2016-01-09 DIAGNOSIS — R51 Headache: Secondary | ICD-10-CM | POA: Diagnosis not present

## 2016-01-09 DIAGNOSIS — R509 Fever, unspecified: Secondary | ICD-10-CM | POA: Diagnosis not present

## 2016-01-09 DIAGNOSIS — R350 Frequency of micturition: Secondary | ICD-10-CM

## 2016-01-09 DIAGNOSIS — R5383 Other fatigue: Secondary | ICD-10-CM | POA: Diagnosis not present

## 2016-01-09 DIAGNOSIS — Z789 Other specified health status: Secondary | ICD-10-CM

## 2016-01-09 LAB — POCT URINALYSIS DIP (MANUAL ENTRY)
BILIRUBIN UA: NEGATIVE
Glucose, UA: >=1000 — AB
LEUKOCYTES UA: NEGATIVE
NITRITE UA: NEGATIVE
PH UA: 5
Protein Ur, POC: NEGATIVE
RBC UA: NEGATIVE
Spec Grav, UA: 1.015
UROBILINOGEN UA: 0.2

## 2016-01-09 LAB — POCT CBC
Granulocyte percent: 56.1 %G (ref 37–80)
HEMATOCRIT: 44.5 % (ref 43.5–53.7)
HEMOGLOBIN: 15.5 g/dL (ref 14.1–18.1)
LYMPH, POC: 0.9 (ref 0.6–3.4)
MCH, POC: 28.9 pg (ref 27–31.2)
MCHC: 34.9 g/dL (ref 31.8–35.4)
MCV: 82.9 fL (ref 80–97)
MID (cbc): 0.9 (ref 0–0.9)
MPV: 5.9 fL (ref 0–99.8)
POC GRANULOCYTE: 4.5 (ref 2–6.9)
POC LYMPH %: 32.5 % (ref 10–50)
POC MID %: 11.4 % (ref 0–12)
Platelet Count, POC: 201 10*3/uL (ref 142–424)
RBC: 5.36 M/uL (ref 4.69–6.13)
RDW, POC: 13.5 %
WBC: 8 10*3/uL (ref 4.6–10.2)

## 2016-01-09 LAB — POC MICROSCOPIC URINALYSIS (UMFC)

## 2016-01-09 LAB — POCT INFLUENZA A/B
Influenza A, POC: NEGATIVE
Influenza B, POC: NEGATIVE

## 2016-01-09 LAB — GLUCOSE, POCT (MANUAL RESULT ENTRY): POC GLUCOSE: 145 mg/dL — AB (ref 70–99)

## 2016-01-09 MED ORDER — DOXYCYCLINE HYCLATE 100 MG PO TABS: 100.0000 mg | ORAL_TABLET | Freq: Two times a day (BID) | ORAL | Status: DC

## 2016-01-09 NOTE — Progress Notes (Signed)
By signing my name below, I, [name], attest that this documentation has been prepared under the direction and in the presence of [name], MD.  Electronically Signed: [name], Medical Scribe. 01/09/2016. 10:00 AM.  Subjective:    Patient ID: Brandon Rich, male    DOB: Dec 11, 1960, 55 y.o.   MRN: [id number]  HPI Chief Complaint  Patient presents with  . Fatigue    Since sunday   . Fever    HPI Comments: Brandon Rich is a 55 y.o. male with a PMHx of DM, HTN, and HLD who presents to the [facility] complaining of onset Sunday. Pt reports frontal HA, arthralgias, fatigue, and fever of 101. Pt's states he had pain in the back of his eye when he looks up or side to side. Pt had minimal sinus congestion for an hour the first day of symptoms. Pt reports urinary frequency, and urgency. Pt denies prostate infections in the past. Pt took tylenol which relieved him of fever. Pt's sugars are running 150-190 when he wakes up, but goes down around lunch.  Pt feels better in the morning, but his fever gets worse at night. Pt naps for relief- he never naps. Pt denies cough, congestion, rhinorrhea, sinus pressure, difficulty urinating, dysuria, testicular pain, or penile pain. Previously followed by Dr. Birdena Jubilee  Rash: [place] Pt [place] bug bites while [place] the lake. Pt's bites have improved and subsided in itchiness. Pt denies sick contacts. Pt denies pulling any ticks off of him.   Patient Active Problem List   Diagnosis Date Noted  . HTN (hypertension) 12/20/2011  . Diabetes mellitus ([place]) 12/20/2011  . Dyslipidemia 12/20/2011   Past Medical History  Diagnosis Date  . Hypertension   . Hyperlipidemia   . Diabetes mellitus   . Glaucoma   . Allergy    Past Surgical History  Procedure Laterality Date  . Hernia repair    . Spine surgery    . Tumor removal     No Known Allergies Prior to Admission medications     Medication Sig Start Date End Date Taking? Authorizing Provider  Ascorbic Acid (VITAMIN C) 1000 MG tablet Take 1,000 mg by mouth daily.    Historical Provider, MD  aspirin 81 MG tablet Take 81 mg by mouth daily.    Historical Provider, MD  atorvastatin (LIPITOR) 20 MG tablet Take 1 tablet (20 mg total) by mouth daily. 06/16/15   Orma Flaming, MD  brimonidine (ALPHAGAN) 0.2 % ophthalmic solution Place 1 drop into both eyes 2 (two) times daily.    Historical Provider, MD  canagliflozin (INVOKANA) 300 MG TABS tablet Take 300 mg by mouth daily before breakfast. 06/16/15   Orma Flaming, MD  fexofenadine (ALLEGRA) 180 MG tablet Take 180 mg by mouth daily.    Historical Provider, MD  fluticasone (FLONASE) 50 MCG/ACT nasal spray PLACE 2 SPRAYS INTO BOTH NOSTRILS DAILY. 07/09/14   Orma Flaming, MD  Liraglutide (VICTOZA) 18 MG/3ML SOPN USE AS DIRECTED INJECTING1.8MG  SUBCUTANEOUSLY ONCEDAILY 10/31/15   Mancel Bale, PA-C  lisinopril (PRINIVIL,ZESTRIL) 20 MG tablet Take 1 tablet (20 mg total) by mouth daily. 06/16/15   Orma Flaming, MD  metFORMIN (GLUCOPHAGE) 1000 MG tablet Take 1 tablet (1,000 mg total) by mouth 2 (two) times daily with a meal. 06/16/15   Orma Flaming, MD  Multiple Vitamin (MULTIVITAMIN) tablet Take 1 tablet by mouth daily.    Historical  Provider, MD  timolol (BETIMOL) 0.5 % ophthalmic solution Place 1 drop into both eyes daily.    Historical Provider, MD  zoster vaccine live, PF, (ZOSTAVAX) 60454 UNT/0.65ML injection Inject 19,400 Units into the skin once. 06/16/15   Orma Flaming, MD   Social History   Social History  . Marital Status: Unknown    Spouse Name: N/A  . Number of Children: 2  . Years of Education: 16   Occupational History  . Tour manager    Social History Main Topics  . Smoking status: Never Smoker   . Smokeless tobacco: Not on file  . Alcohol Use: No     Comment: 1 drink  . Drug Use: No  . Sexual Activity:    Partners: Female   Other Topics  Concern  . Not on file   Social History Narrative   Married. Education: [employer].    Review of Systems  Constitutional: Positive for fever and fatigue.  HENT: Negative for congestion, rhinorrhea and sinus pressure.   Eyes: Positive for pain (when looking up and side to side).  Respiratory: Negative for cough.   Genitourinary: Positive for urgency and frequency. Negative for dysuria, hematuria, difficulty urinating, penile pain and testicular pain.  Musculoskeletal: Positive for arthralgias.  Skin: Positive for rash.  Neurological: Positive for headaches.   Objective:  BP 140/80 mmHg  Pulse 89  Temp(Src) 99.4 F (37.4 C) (Oral)  Resp 17  Ht 5\' 10"  (1.778 m)  Wt 274 lb (124.286 kg)  BMI 39.32 kg/m2  SpO2 97%  Physical Exam  Constitutional: He is oriented to person, place, and time. He appears well-developed and well-nourished.  HENT:  Head: Normocephalic and atraumatic.  Eyes: EOM are normal. Pupils are equal, round, and reactive to light.  Neck: No JVD present. Carotid bruit is not present.  Cardiovascular: Normal rate, regular rhythm and normal heart sounds.   No murmur heard. Pulmonary/Chest: Effort normal and breath sounds normal. No respiratory distress. He has no wheezes. He has no rales.  Genitourinary:  No apparent nodules Nontender on DRE  Musculoskeletal: He exhibits no edema.  Neurological: He is alert and oriented to person, place, and time.  Skin: Skin is warm and dry.  Well healed insct bites on the back of his thigh.  Psychiatric: He has a normal mood and affect.  Vitals reviewed.  Results for orders placed or performed in visit on 01/09/16  POCT CBC  Result Value Ref Range   WBC 8.0 4.6 - 10.2 K/uL   Lymph, poc 0.9 0.6 - 3.4   POC LYMPH PERCENT 32.5 10 - 50 %L   MID (cbc) 0.9 0 - 0.9   POC MID % 11.4 0 - 12 %M   POC Granulocyte 4.5 2 - 6.9   Granulocyte percent 56.1 37 - 80 %G   RBC 5.36 4.69 - 6.13 M/uL   Hemoglobin 15.5 14.1 - 18.1 g/dL   HCT,  POC 44.5 43.5 - 53.7 %   MCV 82.9 80 - 97 fL   MCH, POC 28.9 27 - 31.2 pg   MCHC 34.9 31.8 - 35.4 g/dL   RDW, POC 13.5 %   Platelet Count, POC 201 142 - 424 K/uL   MPV 5.9 0 - 99.8 fL  POCT glucose (manual entry)  Result Value Ref Range   POC Glucose 145 (A) 70 - 99 mg/dl  POCT urinalysis dipstick  Result Value Ref Range   Color, UA yellow yellow   Clarity, UA clear clear  Glucose, UA >=1,000 (A) negative   Bilirubin, UA negative negative   Ketones, POC UA small (15) (A) negative   Spec Grav, UA 1.015    Blood, UA negative negative   pH, UA 5.0    Protein Ur, POC negative negative   Urobilinogen, UA 0.2    Nitrite, UA Negative Negative   Leukocytes, UA Negative Negative  POCT Influenza A/B  Result Value Ref Range   Influenza A, POC Negative Negative   Influenza B, POC Negative Negative  POCT Microscopic Urinalysis ([facility])  Result Value Ref Range   WBC,UR,HPF,POC None None WBC/hpf   RBC,UR,HPF,POC None None RBC/hpf   Bacteria Few (A) None, Too numerous to count   Mucus Present (A) Absent   Epithelial Cells, UR Per Microscopy Few (A) None, Too numerous to count cells/hpf   Assessment & Plan:   Brandon Rich is a 55 y.o. male Fever, unspecified - Plan: POCT CBC, POCT Influenza A/B, doxycycline (VIBRA-TABS) 100 MG tablet  Other fatigue - Plan: POCT CBC, POCT glucose (manual entry), doxycycline (VIBRA-TABS) 100 MG tablet  Urinary frequency - Plan: PSA, POCT urinalysis dipstick, POCT Microscopic Urinalysis ([facility])  History of recent travel - Plan: doxycycline (VIBRA-TABS) 100 MG tablet  Nonintractable headache, unspecified chronicity pattern, unspecified headache type  Low-grade fevers, headache, malaise. Reassuring CBC and negative flu test. Recent travel to [facility], tickborne illness possible. Nontoxic-appearing at present.  -Start doxycycline, fever care discussed with Tylenol or short course of NSAID.  -Check PSA, but no signs of prostatitis at this time. Increased  frequency may be due to hyperglycemia.  -Return to clinic precautions if not improving next week, if worsening - return sooner  Meds ordered this encounter  Medications  . doxycycline (VIBRA-TABS) 100 MG tablet    Sig: Take 1 tablet (100 mg total) by mouth 2 (two) times daily.    Dispense:  20 tablet    Refill:  0   Patient Instructions       IF you received an x-ray today, you will receive an invoice from [facility]. Please contact [facility] at [phone] with questions or concerns regarding your invoice.   IF you received labwork today, you will receive an invoice from [employer]. Please contact [facility] at [phone] with questions or concerns regarding your invoice.   Our billing staff will not be able to assist you with questions regarding bills from these companies.  You will be contacted with the lab results as soon as they are available. The fastest way to get your results is to activate your My Chart account. Instructions are located on the last page of this paperwork. If you have not heard from [place] regarding the results in 2 weeks, please contact this office.     It is possible you have had a viral illness, but with your recent travel to [facility], will cover for possible tickborne illness. Start doxycycline 1 pill twice per day for the next 10 days. If the fevers persist in the next 3-4 days, or any worsening, recommend recheck here or emergency room if you acutely worsen. Let me know if you have any questions in the meantime. I will check a prostate test, and this should be back within the next week to 10 days.      I personally performed the services described in this documentation, which was scribed in my presence. The recorded information has been reviewed and considered, and addended by me as needed.   Signed,   Dellis Filbert  Carlota Raspberry, MD [facility].  01/09/2016 11:18 AM

## 2016-01-09 NOTE — Patient Instructions (Addendum)
     IF you received an x-ray today, you will receive an invoice from [facility]. Please contact [facility] at [phone] with questions or concerns regarding your invoice.   IF you received labwork today, you will receive an invoice from [employer]. Please contact [facility] at [phone] with questions or concerns regarding your invoice.   Our billing staff will not be able to assist you with questions regarding bills from these companies.  You will be contacted with the lab results as soon as they are available. The fastest way to get your results is to activate your My Chart account. Instructions are located on the last page of this paperwork. If you have not heard from [place] regarding the results in 2 weeks, please contact this office.     It is possible you have had a viral illness, but with your recent travel to [facility], will cover for possible tickborne illness. Start doxycycline 1 pill twice per day for the next 10 days. If the fevers persist in the next 3-4 days, or any worsening, recommend recheck here or emergency room if you acutely worsen. Let me know if you have any questions in the meantime. I will check a prostate test, and this should be back within the next week to 10 days.

## 2016-01-10 LAB — PSA: PSA: 1.17 ng/mL (ref <=4.00)

## 2016-01-21 ENCOUNTER — Emergency Department (HOSPITAL_COMMUNITY): Payer: BLUE CROSS/BLUE SHIELD

## 2016-01-21 ENCOUNTER — Encounter (HOSPITAL_COMMUNITY): Payer: Self-pay | Admitting: Emergency Medicine

## 2016-01-21 ENCOUNTER — Ambulatory Visit (INDEPENDENT_AMBULATORY_CARE_PROVIDER_SITE_OTHER): Payer: BLUE CROSS/BLUE SHIELD

## 2016-01-21 ENCOUNTER — Emergency Department (HOSPITAL_COMMUNITY)
Admission: EM | Admit: 2016-01-21 | Discharge: 2016-01-22 | Disposition: A | Discharge status: Home / Self Care | Payer: BLUE CROSS/BLUE SHIELD | Attending: Emergency Medicine | Admitting: Emergency Medicine

## 2016-01-21 ENCOUNTER — Ambulatory Visit (INDEPENDENT_AMBULATORY_CARE_PROVIDER_SITE_OTHER): Payer: BLUE CROSS/BLUE SHIELD | Admitting: Family Medicine

## 2016-01-21 VITALS — BP 116/62 | HR 101 | Temp 99.4°F | Resp 18 | Ht 70.0 in | Wt 279.0 lb

## 2016-01-21 DIAGNOSIS — R05 Cough: Secondary | ICD-10-CM

## 2016-01-21 DIAGNOSIS — Z79899 Other long term (current) drug therapy: Secondary | ICD-10-CM | POA: Insufficient documentation

## 2016-01-21 DIAGNOSIS — Z7984 Long term (current) use of oral hypoglycemic drugs: Secondary | ICD-10-CM | POA: Diagnosis not present

## 2016-01-21 DIAGNOSIS — Z7982 Long term (current) use of aspirin: Secondary | ICD-10-CM | POA: Insufficient documentation

## 2016-01-21 DIAGNOSIS — R8299 Other abnormal findings in urine: Secondary | ICD-10-CM

## 2016-01-21 DIAGNOSIS — R5383 Other fatigue: Secondary | ICD-10-CM | POA: Insufficient documentation

## 2016-01-21 DIAGNOSIS — E119 Type 2 diabetes mellitus without complications: Secondary | ICD-10-CM | POA: Diagnosis not present

## 2016-01-21 DIAGNOSIS — E785 Hyperlipidemia, unspecified: Secondary | ICD-10-CM | POA: Diagnosis not present

## 2016-01-21 DIAGNOSIS — R1011 Right upper quadrant pain: Secondary | ICD-10-CM | POA: Insufficient documentation

## 2016-01-21 DIAGNOSIS — R509 Fever, unspecified: Secondary | ICD-10-CM | POA: Insufficient documentation

## 2016-01-21 DIAGNOSIS — R0600 Dyspnea, unspecified: Secondary | ICD-10-CM

## 2016-01-21 DIAGNOSIS — R82998 Other abnormal findings in urine: Secondary | ICD-10-CM

## 2016-01-21 DIAGNOSIS — R059 Cough, unspecified: Secondary | ICD-10-CM

## 2016-01-21 DIAGNOSIS — R739 Hyperglycemia, unspecified: Secondary | ICD-10-CM | POA: Diagnosis not present

## 2016-01-21 DIAGNOSIS — I10 Essential (primary) hypertension: Secondary | ICD-10-CM | POA: Diagnosis not present

## 2016-01-21 LAB — URINALYSIS, ROUTINE W REFLEX MICROSCOPIC
BILIRUBIN URINE: NEGATIVE
HGB URINE DIPSTICK: NEGATIVE
Ketones, ur: 15 mg/dL — AB
LEUKOCYTES UA: NEGATIVE
Nitrite: NEGATIVE
Protein, ur: NEGATIVE mg/dL
SPECIFIC GRAVITY, URINE: 1.042 — AB (ref 1.005–1.030)
pH: 5 (ref 5.0–8.0)

## 2016-01-21 LAB — COMPREHENSIVE METABOLIC PANEL
ALBUMIN: 2.9 g/dL — AB (ref 3.5–5.0)
ALK PHOS: 127 U/L — AB (ref 38–126)
ALT: 100 U/L — ABNORMAL HIGH (ref 17–63)
ANION GAP: 11 (ref 5–15)
AST: 156 U/L — ABNORMAL HIGH (ref 15–41)
BILIRUBIN TOTAL: 0.8 mg/dL (ref 0.3–1.2)
BUN: 12 mg/dL (ref 6–20)
CALCIUM: 9.2 mg/dL (ref 8.9–10.3)
CO2: 20 mmol/L — ABNORMAL LOW (ref 22–32)
Chloride: 101 mmol/L (ref 101–111)
Creatinine, Ser: 1.04 mg/dL (ref 0.61–1.24)
GFR calc Af Amer: >60 mL/min (ref >60)
GLUCOSE: 180 mg/dL — AB (ref 65–99)
POTASSIUM: 3.5 mmol/L (ref 3.5–5.1)
Sodium: 132 mmol/L — ABNORMAL LOW (ref 135–145)
TOTAL PROTEIN: 7.1 g/dL (ref 6.5–8.1)

## 2016-01-21 LAB — URINE MICROSCOPIC-ADD ON
RBC / HPF: NONE SEEN RBC/hpf (ref 0–5)
WBC, UA: NONE SEEN WBC/hpf (ref 0–5)

## 2016-01-21 LAB — CBC WITH DIFFERENTIAL/PLATELET
BASOS PCT: 2 %
Basophils Absolute: 0.3 10*3/uL — ABNORMAL HIGH (ref 0.0–0.1)
EOS PCT: 1 %
Eosinophils Absolute: 0.1 10*3/uL (ref 0.0–0.7)
HEMATOCRIT: 39 % (ref 39.0–52.0)
HEMOGLOBIN: 13.1 g/dL (ref 13.0–17.0)
LYMPHS ABS: 8.8 10*3/uL — AB (ref 0.7–4.0)
Lymphocytes Relative: 61 %
MCH: 28.2 pg (ref 26.0–34.0)
MCHC: 33.6 g/dL (ref 30.0–36.0)
MCV: 84.1 fL (ref 78.0–100.0)
MONO ABS: 1.2 10*3/uL — AB (ref 0.1–1.0)
MONOS PCT: 8 %
NEUTROS ABS: 4.1 10*3/uL (ref 1.7–7.7)
Neutrophils Relative %: 28 %
Platelets: 285 10*3/uL (ref 150–400)
RBC: 4.64 MIL/uL (ref 4.22–5.81)
RDW: 14.3 % (ref 11.5–15.5)
WBC: 14.5 10*3/uL — ABNORMAL HIGH (ref 4.0–10.5)

## 2016-01-21 LAB — POCT CBC
GRANULOCYTE PERCENT: 34.8 % — AB (ref 37–80)
HEMATOCRIT: 41.7 % — AB (ref 43.5–53.7)
Hemoglobin: 14.2 g/dL (ref 14.1–18.1)
LYMPH, POC: 7.3 — AB (ref 0.6–3.4)
MCH, POC: 28.2 pg (ref 27–31.2)
MCHC: 34 g/dL (ref 31.8–35.4)
MCV: 82.8 fL (ref 80–97)
MID (cbc): 1.3 — AB (ref 0–0.9)
MPV: 6.5 fL (ref 0–99.8)
POC GRANULOCYTE: 4.6 (ref 2–6.9)
POC LYMPH %: 55 % — AB (ref 10–50)
POC MID %: 10.2 % (ref 0–12)
Platelet Count, POC: 308 10*3/uL (ref 142–424)
RBC: 5.04 M/uL (ref 4.69–6.13)
RDW, POC: 13.9 %
WBC: 13.2 10*3/uL — AB (ref 4.6–10.2)

## 2016-01-21 LAB — GLUCOSE, POCT (MANUAL RESULT ENTRY): POC GLUCOSE: 202 mg/dL — AB (ref 70–99)

## 2016-01-21 MED ORDER — SODIUM CHLORIDE 0.9 % IV BOLUS (SEPSIS)
1000.0000 mL | Freq: Once | INTRAVENOUS | Status: AC
  Administered 2016-01-21: 1000 mL via INTRAVENOUS

## 2016-01-21 MED ORDER — IOPAMIDOL (ISOVUE-300) INJECTION 61%
INTRAVENOUS | Status: AC
  Administered 2016-01-21: 100 mL
  Filled 2016-01-21: qty 100

## 2016-01-21 NOTE — Patient Instructions (Addendum)
     IF you received an x-ray today, you will receive an invoice from [facility]. Please contact [facility] at [phone] with questions or concerns regarding your invoice.   IF you received labwork today, you will receive an invoice from [employer]. Please contact [facility] at [phone] with questions or concerns regarding your invoice.   Our billing staff will not be able to assist you with questions regarding bills from these companies.  You will be contacted with the lab results as soon as they are available. The fastest way to get your results is to activate your My Chart account. Instructions are located on the last page of this paperwork. If you have not heard from [place] regarding the results in 2 weeks, please contact this office.     As we discussed, further evaluation through the emergency room [facility]) to be completed tonight for your persistent fever, now with new shortness of breath, cough, and weight gain. I did some other testing here that will not be available tonight, but some of that can be evaluated through the emergency room.  If they determine that you can be treated outpatient, I am here all day Friday from 8 AM to when we close. Call Friday morning or tomorrow after 4 for appointment with me on Friday.

## 2016-01-21 NOTE — Progress Notes (Addendum)
By signing my name below I, Brandon Rich, attest that this documentation has been prepared under the direction and in the presence of [name], MD. Electonically Signed. Brandon Rich, Scribe 01/21/2016 at 6:24 PM  Subjective:    Patient ID: Brandon Rich, male    DOB: [birth year], 55 y.o.   MRN: [id number]  Chief Complaint  Patient presents with  . Follow-up    cough,fever    HPI Brandon Rich is a 55 y.o. male who presents to the [facility] for reevaluation after pt was seen at [facility] by Dr Carlota Raspberry on 01/09/16 for HA, fever, and joint pain. Pt denied any known tick bites at that time. Pt's flu swab was negative at that time as well. [place] to symptoms started. Pt was started on 100mg  doxycyline BID for 10 days to treat possible tick borne illness.   PSA and CBC was normal at last visit. Pt was also c/o urinary frequency at that time which was though to be due to his hyperglycemia secondary to pt's DM. Pt's blood sugar has been in the mid to high 100s since the last visit.   Since last visit, Pt states that fever has persisted and fatigue has worsened. Pt developed cough approximately a week ago that has been constant since onset. Cough is non-productive. Pt also reports SOB with minimal exertion for the past week. Pt denies CP other than a mild pain with cough. Pt has history of childhood pneumonia.   Fever Pt has been afebrile every morning, and fever starts in the afternoon and worsens typically reaching 101 everyday. Pt has been taking tylenol and motrin with minimal relief of fever. Highest measured fever occurred 2 days ago and was 102.8. Pt has had a HA with fever.  Pt reports reduced appetite and increasing weight since last visit. Pt states that his abd is distended. Pt denies abd pain.   Pt denies history of CHF. Pt denies any lower leg edema.  Patient Active Problem List   Diagnosis Date Noted  . HTN  (hypertension) 12/20/2011  . Diabetes mellitus ([place]) 12/20/2011  . Dyslipidemia 12/20/2011   Past Medical History  Diagnosis Date  . Hypertension   . Hyperlipidemia   . Diabetes mellitus   . Glaucoma   . Allergy    Past Surgical History  Procedure Laterality Date  . Hernia repair    . Spine surgery    . Tumor removal     No Known Allergies Prior to Admission medications   Medication Sig Start Date End Date Taking? Authorizing Provider  Ascorbic Acid (VITAMIN C) 1000 MG tablet Take 1,000 mg by mouth daily.   Yes Historical Provider, MD  aspirin 81 MG tablet Take 81 mg by mouth daily.   Yes Historical Provider, MD  atorvastatin (LIPITOR) 20 MG tablet Take 1 tablet (20 mg total) by mouth daily. 06/16/15  Yes Orma Flaming, MD  brimonidine (ALPHAGAN) 0.2 % ophthalmic solution Place 1 drop into both eyes 2 (two) times daily.   Yes Historical Provider, MD  canagliflozin (INVOKANA) 300 MG TABS tablet Take 300 mg by mouth daily before breakfast. 06/16/15  Yes Orma Flaming, MD  fexofenadine (ALLEGRA) 180 MG tablet Take 180 mg by mouth daily.   Yes Historical Provider, MD  fluticasone (FLONASE) 50 MCG/ACT nasal spray PLACE 2 SPRAYS INTO BOTH NOSTRILS DAILY. 07/09/14  Yes Orma Flaming, MD  Liraglutide Donna Bernard)  18 MG/3ML SOPN USE AS DIRECTED INJECTING1.8MG  SUBCUTANEOUSLY ONCEDAILY 10/31/15  Yes Sarah Alleen Borne, PA-C  lisinopril (PRINIVIL,ZESTRIL) 20 MG tablet Take 1 tablet (20 mg total) by mouth daily. 06/16/15  Yes Orma Flaming, MD  metFORMIN (GLUCOPHAGE) 1000 MG tablet Take 1 tablet (1,000 mg total) by mouth 2 (two) times daily with a meal. 06/16/15  Yes Orma Flaming, MD  Multiple Vitamin (MULTIVITAMIN) tablet Take 1 tablet by mouth daily.   Yes Historical Provider, MD  timolol (BETIMOL) 0.5 % ophthalmic solution Place 1 drop into both eyes daily.   Yes Historical Provider, MD  zoster vaccine live, PF, (ZOSTAVAX) 09811 UNT/0.65ML injection Inject 19,400 Units into the skin once. 06/16/15   Yes Orma Flaming, MD   Social History   Social History  . Marital Status: Unknown    Spouse Name: N/A  . Number of Children: 2  . Years of Education: 16   Occupational History  . Tour manager    Social History Main Topics  . Smoking status: Never Smoker   . Smokeless tobacco: Not on file  . Alcohol Use: No     Comment: 1 drink  . Drug Use: No  . Sexual Activity:    Partners: Female   Other Topics Concern  . Not on file   Social History Narrative   Married. Education: [employer].       Review of Systems  Constitutional: Positive for fever, fatigue and unexpected weight change.  Eyes: Negative for visual disturbance.  Respiratory: Positive for cough and shortness of breath. Negative for chest tightness.   Cardiovascular: Positive for chest pain (mild, with cough only). Negative for palpitations and leg swelling.  Gastrointestinal: Positive for abdominal distention. Negative for abdominal pain and blood in stool.  Neurological: Positive for headaches. Negative for dizziness and light-headedness.       Objective:   Physical Exam  Constitutional: He is oriented to person, place, and time. He appears well-developed and well-nourished.  HENT:  Head: Normocephalic and atraumatic.  Right Ear: Tympanic membrane, external ear and ear canal normal.  Left Ear: Tympanic membrane, external ear and ear canal normal.  Nose: No rhinorrhea.  Mouth/Throat: Oropharynx is clear and moist and mucous membranes are normal. No oropharyngeal exudate or posterior oropharyngeal erythema.  Eyes: Conjunctivae and EOM are normal. Pupils are equal, round, and reactive to light.  Neck: Neck supple. No JVD present. Carotid bruit is not present.  Cardiovascular: Regular rhythm, normal heart sounds and intact distal pulses.  Tachycardia present.  Exam reveals no gallop and no friction rub.   No murmur heard. Pulmonary/Chest: Effort normal and breath sounds normal. He has no wheezes. He has  no rales.  Abdominal: Soft. Normal appearance and bowel sounds are normal. He exhibits distension (protuberant abdomen. ). There is no tenderness. There is no rebound and no guarding.  Musculoskeletal: He exhibits no edema.  Lymphadenopathy:    He has no cervical adenopathy.  Neurological: He is alert and oriented to person, place, and time.  Skin: Skin is warm and dry. No rash noted.  Psychiatric: He has a normal mood and affect. His behavior is normal.  Vitals reviewed.    Filed Vitals:   01/21/16 1712  BP: 116/62  Pulse: 101  Temp: 99.4 F (37.4 C)  TempSrc: Oral  Resp: 18  Height: 5\' 10"  (1.778 m)  Weight: 279 lb (126.554 kg)  SpO2: 95%   Wt Readings from Last 3 Encounters:  01/21/16 279 lb (126.554 kg)  01/09/16  274 lb (124.286 kg)  06/16/15 275 lb (124.739 kg)   Dg Chest 2 View  01/21/2016  CLINICAL DATA:  Cough, fever and fatigue for approximately 1 week. EXAM: CHEST  2 VIEW COMPARISON:  PA and lateral chest 02/11/2010. FINDINGS: The lungs are clear. Heart size is normal. No pneumothorax or pleural effusion. No focal bony abnormality. Thoracic spondylosis noted. IMPRESSION: No acute disease. Electronically Signed   By: Inge Rise M.D.   On: 01/21/2016 18:42   Results for orders placed or performed in visit on 01/21/16  POCT CBC  Result Value Ref Range   WBC 13.2 (A) 4.6 - 10.2 K/uL   Lymph, poc 7.3 (A) 0.6 - 3.4   POC LYMPH PERCENT 55.0 (A) 10 - 50 %L   MID (cbc) 1.3 (A) 0 - 0.9   POC MID % 10.2 0 - 12 %M   POC Granulocyte 4.6 2 - 6.9   Granulocyte percent 34.8 (A) 37 - 80 %G   RBC 5.04 4.69 - 6.13 M/uL   Hemoglobin 14.2 14.1 - 18.1 g/dL   HCT, POC 41.7 (A) 43.5 - 53.7 %   MCV 82.8 80 - 97 fL   MCH, POC 28.2 27 - 31.2 pg   MCHC 34.0 31.8 - 35.4 g/dL   RDW, POC 13.9 %   Platelet Count, POC 308 142 - 424 K/uL   MPV 6.5 0 - 99.8 fL  POCT glucose (manual entry)  Result Value Ref Range   POC Glucose 202 (A) 70 - 99 mg/dl   EKG: Sinus rhythm, nonspecific  T-wave abnormality, with flattening of V5 and V6 compared to old EKG, otherwise no apparent changes from November 2016.     Assessment & Plan:   MONTEL BORAK is a 55 y.o. male Dyspnea - Plan: Brain natriuretic peptide, EKG 12-Lead  Fever, unspecified - Plan: Lyme Disease Abs IgG, IgM, IFA, CSF, POCT CBC, DG Chest 2 View, Rickettsia Ab Pnl w/Rfx to Titer, Epstein-Barr virus VCA antibody panel, Lactate Dehydrogenase, Sedimentation Rate, CMV IgM, HIV antibody, CANCELED: [place] spotted fvr ab, IgM-blood, CANCELED: CMV abs, IgG+IgM  Cough - Plan: Rickettsia Ab Pnl w/Rfx to Titer, EKG 12-Lead  Dark urine - Plan: COMPLETE METABOLIC PANEL WITH GFR  Hyperglycemia - Plan: POCT glucose (manual entry)  55 year old male with history of hypertension, hyperlipidemia, diabetes,  Now with approximately 16-17 days of persistent fever, and initial arthralgias, headache and fatigue. Initial evaluation with normal CBC, negative flu testing, and normal PSA. He was treated with doxycycline for a 10 day course [place]  [place] for possible tickborne illness. Fevers have persisted, and now is 2 days after completion of  doxycycline. Tmax 102.9 two nights ago. Febrile in office, and has persisted with fevers in spite of scheduled ibuprofen at home. Now complaints of cough, dyspnea on exertion, abdominal distention without abdominal pain, darkening of the urine, and 5 pound weight gain even with decreased intake over the past week.   CBC now notable for leukocytosis, but lymphocytic predominance, no apparent infiltrate on CXR. Minimal possible lateral changes on EKG, but no definite acute findings. Differential diagnosis includes persistent viral illness versus pericarditis/endocarditis, less likely tickborne illness, or PE.   - EBV, CMV, Rickettsia, Lyme titers obtained.  -Check HIV, sedimentation rate, LDH, CMP with hx of dark urine, and BNP ordered with wt. gain/dyspnea.    - Initially  discussed outpatient workup, but with progressive worsening of his symptoms,underlying diabetes with decreased control, and now  with shortness of breath and cough with weight gain decided on ER eval. May have other testing there for dyspnea/cough and to determine if inpatient evaluation needed versus continued outpatient eval with possible infectious disease consult.   No orders of the defined types were placed in this encounter.   Patient Instructions       IF you received an x-ray today, you will receive an invoice from [facility]. Please contact [facility] at [phone] with questions or concerns regarding your invoice.   IF you received labwork today, you will receive an invoice from [employer]. Please contact [facility] at [phone] with questions or concerns regarding your invoice.   Our billing staff will not be able to assist you with questions regarding bills from these companies.  You will be contacted with the lab results as soon as they are available. The fastest way to get your results is to activate your My Chart account. Instructions are located on the last page of this paperwork. If you have not heard from [place] regarding the results in 2 weeks, please contact this office.     As we discussed, further evaluation through the emergency room [facility]) to be completed tonight for your persistent fever, now with new shortness of breath, cough, and weight gain. I did some other testing here that will not be available tonight, but some of that can be evaluated through the emergency room.  If they determine that you can be treated outpatient, I am here all day Friday from 8 AM to when we close. Call Friday morning or tomorrow after 4 for appointment with me on Friday.    I personally performed the services described in this documentation, which was scribed in my presence. The recorded information has been reviewed and considered,  and addended by me as needed.   Signed,   [name], MD [facility].  01/21/2016 7:41 PM

## 2016-01-21 NOTE — ED Notes (Signed)
Pt. reports intermittent fever with chills , fatigue , occasional dry cough /chest congestion onset 1 1/2 weeks ago .

## 2016-01-21 NOTE — ED Provider Notes (Signed)
CSN: [id number]     Arrival date & time 01/21/16  2049 History   First MD Initiated Contact with Patient 01/21/16 2130     Chief Complaint  Patient presents with  . Fever  . Fatigue  . Cough     (Consider location/radiation/quality/duration/timing/severity/associated sxs/prior Treatment) Patient is a 55 y.o. male presenting with fever and cough.  Fever Max temp prior to arrival:  102 Temp source:  Oral Severity:  Moderate Onset quality:  Gradual Duration:  16 days Timing:  Intermittent (daily) Progression:  Waxing and waning Chronicity:  New Relieved by:  Ibuprofen Worsened by:  Nothing tried Associated symptoms: cough, headaches (occasional) and myalgias   Associated symptoms: no rash   Associated symptoms comment:  Weakness Cough:    Cough characteristics:  Non-productive   Onset quality:  Gradual   Timing:  Sporadic   Progression:  Unchanged Risk factors: no sick contacts   Cough Associated symptoms: fever, headaches (occasional) and myalgias   Associated symptoms: no rash     Past Medical History  Diagnosis Date  . Hypertension   . Hyperlipidemia   . Diabetes mellitus   . Glaucoma   . Allergy    Past Surgical History  Procedure Laterality Date  . Hernia repair    . Spine surgery    . Tumor removal     Family History  Problem Relation Age of Onset  . Skin cancer Mother   . Cancer Mother   . Heart disease Mother   . Hypertension Mother   . Cancer Father   . Heart disease Father   . Heart disease Maternal Grandmother   . Hyperlipidemia Maternal Grandmother   . Hypertension Maternal Grandmother   . Heart disease Maternal Grandfather   . Heart disease Paternal Grandfather   . Hypertension Paternal Grandfather   . Hyperlipidemia Paternal Grandfather    Social History  Substance Use Topics  . Smoking status: Never Smoker   . Smokeless tobacco: None  . Alcohol Use: No     Comment: 1 drink    Review of Systems  Constitutional: Positive for fever,  appetite change (loss) and unexpected weight change (weight gain of 5 lbs).  Respiratory: Positive for cough.   Musculoskeletal: Positive for myalgias.  Skin: Negative for rash.  Neurological: Positive for headaches (occasional).  All other systems reviewed and are negative.     Allergies  Review of patient's allergies indicates no known allergies.  Home Medications   Prior to Admission medications   Medication Sig Start Date End Date Taking? Authorizing Provider  Ascorbic Acid (VITAMIN C) 1000 MG tablet Take 1,000 mg by mouth daily.   Yes Historical Provider, MD  aspirin 81 MG tablet Take 81 mg by mouth daily.   Yes Historical Provider, MD  atorvastatin (LIPITOR) 20 MG tablet Take 1 tablet (20 mg total) by mouth daily. 06/16/15  Yes Orma Flaming, MD  brimonidine (ALPHAGAN) 0.2 % ophthalmic solution Place 1 drop into both eyes 2 (two) times daily.   Yes Historical Provider, MD  calcium carbonate (TUMS - DOSED IN MG ELEMENTAL CALCIUM) 500 MG chewable tablet Chew 2 tablets by mouth daily as needed for indigestion or heartburn.   Yes Historical Provider, MD  canagliflozin (INVOKANA) 300 MG TABS tablet Take 300 mg by mouth daily before breakfast. 06/16/15  Yes Orma Flaming, MD  fexofenadine (ALLEGRA) 180 MG tablet Take 180 mg by mouth daily.   Yes Historical Provider, MD  fluticasone (FLONASE) 50 MCG/ACT nasal spray PLACE  2 SPRAYS INTO BOTH NOSTRILS DAILY. 07/09/14  Yes Orma Flaming, MD  ibuprofen (ADVIL,MOTRIN) 600 MG tablet Take 600 mg by mouth every 6 (six) hours as needed.   Yes Historical Provider, MD  Liraglutide (VICTOZA) 18 MG/3ML SOPN USE AS DIRECTED INJECTING1.8MG  SUBCUTANEOUSLY ONCEDAILY 10/31/15  Yes Mancel Bale, PA-C  lisinopril (PRINIVIL,ZESTRIL) 20 MG tablet Take 1 tablet (20 mg total) by mouth daily. 06/16/15  Yes Orma Flaming, MD  metFORMIN (GLUCOPHAGE) 1000 MG tablet Take 1 tablet (1,000 mg total) by mouth 2 (two) times daily with a meal. 06/16/15  Yes Orma Flaming, MD   Multiple Vitamin (MULTIVITAMIN) tablet Take 1 tablet by mouth every evening.    Yes Historical Provider, MD  timolol (BETIMOL) 0.5 % ophthalmic solution Place 1 drop into both eyes at bedtime.    Yes Historical Provider, MD  doxycycline (VIBRA-TABS) 100 MG tablet Take 100 mg by mouth 2 (two) times daily. Reported on 01/21/2016 01/09/16   Historical Provider, MD  zoster vaccine live, PF, (ZOSTAVAX) 28413 UNT/0.65ML injection Inject 19,400 Units into the skin once. 06/16/15   Orma Flaming, MD   BP 130/69 mmHg  Pulse 92  Temp(Src) 99.3 F (37.4 C) (Oral)  Resp 23  SpO2 94% Physical Exam  Constitutional: He is oriented to person, place, and time. He appears well-developed and well-nourished. No distress.  HENT:  Head: Normocephalic and atraumatic.  Eyes: Conjunctivae are normal.  Neck: Neck supple. No tracheal deviation present.  Cardiovascular: Normal rate, regular rhythm and normal heart sounds.   Pulmonary/Chest: Effort normal and breath sounds normal. No respiratory distress. He has no wheezes. He has no rales.  Abdominal: Soft. He exhibits no distension. There is no tenderness. There is no rebound and no guarding.  Neurological: He is alert and oriented to person, place, and time. He has normal strength. No cranial nerve deficit. Coordination and gait normal.  Skin: Skin is warm. He is diaphoretic.  Psychiatric: He has a normal mood and affect.  Vitals reviewed.   ED Course  Procedures (including critical care time)  Emergency Focused Ultrasound Exam Limited ultrasound of the gallbladder and common bile duct.   Performed and interpreted by Dr. Laneta Simmers Indication: Right upper quadrant abdominal pain Multiple views of the gallbladder and common bile duct are obtained with a multi-frequency probe.  Findings: + stones, no pericholecystic fluid, no gallbladder wall thickening, no CBD dilation Interpretation: + cholelithiasis, no cholecystitis or choledocholithiasis. Images archived  electronically.  CPT Code [phone]   Labs Review Labs Reviewed  CBC WITH DIFFERENTIAL/PLATELET - Abnormal; Notable for the following:    WBC 14.5 (*)    Lymphs Abs 8.8 (*)    Monocytes Absolute 1.2 (*)    Basophils Absolute 0.3 (*)    All other components within normal limits  COMPREHENSIVE METABOLIC PANEL - Abnormal; Notable for the following:    Sodium 132 (*)    CO2 20 (*)    Glucose, Bld 180 (*)    Albumin 2.9 (*)    AST 156 (*)    ALT 100 (*)    Alkaline Phosphatase 127 (*)    All other components within normal limits  URINALYSIS, ROUTINE W REFLEX MICROSCOPIC (NOT AT [facility]) - Abnormal; Notable for the following:    Specific Gravity, Urine 1.042 (*)    Glucose, UA >1000 (*)    Ketones, ur 15 (*)    All other components within normal limits  URINE MICROSCOPIC-ADD ON - Abnormal; Notable for the following:  Squamous Epithelial / LPF 0-5 (*)    Bacteria, UA RARE (*)    Casts GRANULAR CAST (*)    All other components within normal limits  CULTURE, BLOOD (ROUTINE X 2)  CULTURE, BLOOD (ROUTINE X 2)  URINE CULTURE  PATHOLOGIST SMEAR REVIEW  HEPATITIS PANEL, ACUTE  SEDIMENTATION RATE  C-REACTIVE PROTEIN  LACTATE DEHYDROGENASE  MONONUCLEOSIS SCREEN    Imaging Review Dg Chest 2 View  01/21/2016  CLINICAL DATA:  Cough, fever and fatigue for approximately 1 week. EXAM: CHEST  2 VIEW COMPARISON:  PA and lateral chest 02/11/2010. FINDINGS: The lungs are clear. Heart size is normal. No pneumothorax or pleural effusion. No focal bony abnormality. Thoracic spondylosis noted. IMPRESSION: No acute disease. Electronically Signed   By: Inge Rise M.D.   On: 01/21/2016 18:42   Ct Abdomen Pelvis W Contrast  01/22/2016  CLINICAL DATA:  Fever, decreased appetite an abdominal bloating, 1 week duration. EXAM: CT ABDOMEN AND PELVIS WITH CONTRAST TECHNIQUE: Multidetector CT imaging of the abdomen and pelvis was performed using the standard protocol following bolus administration of  intravenous contrast. CONTRAST:  100 cc Isovue-300 COMPARISON:  None. FINDINGS: Lung bases are clear.  No pleural or pericardial fluid. The liver has a normal appearance. I think there are 2 small gallstones in the gallbladder neck. The spleen is normal except for mild low density at the caudal tip likely to represent perfusion anomaly. The pancreas is normal. The adrenal glands are normal. The left kidney is normal. The right kidney shows a 2 cm abnormality along the dorsal upper pole most likely to represent a hyperdense cyst. This is not seen to enhance. The kidneys have not excreted contrast on the delayed images, raising concern about the renal function. The aorta shows aortic atherosclerosis. No aneurysm. The IVC is normal. There is a small amount of intraperitoneal fluid located along the lateral margin of the spleen and in the pelvis. No bowel pathology is seen. Bladder, prostate gland and seminal vesicles are normal. There are extensive chronic degenerative changes of the lumbar spine. IMPRESSION: Two small gallstones in the gallbladder neck. No CT evidence of cholecystitis or obstruction. Small amount of ascites in the left upper quadrant adjacent to the spleen. Small amount also in the pelvis. Etiology unknown. Aortic atherosclerosis. 2 cm exophytic abnormality at the upper pole the right kidney probably representing a hyperdense cyst. MRI recommended for further characterization. This could be done as an outpatient. The kidneys do not excrete contrast on the delayed imaging. Poor renal function is suspected. Electronically Signed   By: Nelson Chimes M.D.   On: 01/22/2016 00:17   I have personally reviewed and evaluated these images and lab results as part of my medical decision-making.   EKG Interpretation None      MDM   Final diagnoses:  Fever of unknown origin    55 year old male presents with almost daily fever for the last 16 days. He has been followed by his primary care doctor who  started him on an empiric course of doxycycline which he has completed. Fevers have persisted, he has had negative flu and EBV titers. His symptoms are fairly nonspecific with mostly fatigue and loss of appetite but has noted weight gain. He does have a lymphocyte predominant leukocytosis of 14 which appears to be increasing and some LFT elevation. Differential is broad to include subacute hepatitis, oncologic disease, other unidentified infectious process. Routine screening for infectious diseases including screening cultures and inflammatory markers for vasculitis were added. Screening  abdominal CT was ordered for LFT elevation after lack of demonstrable source of fever following chest x-ray earlier today which was normal. Pt appears mildly clinically dehydrated so was given saline bolus for supportive care.  CT shows asymptomatic gallstones, no evidence of chole on bedside scan and low suspicion clinically. Trace ascites possibly reactive from hepatitis or other hepatic inflammation but no signs/symptoms of SBP. Do not feel LP would be of utility with low suspicion of meningitis lacking virtually all clinical features.Pt with SIRS but no known source of infection and do not feel ABx would be warranted empirically. FUO workup initiated for follow up as OP with PCP and I discussed case with ID Dr Megan Salon who agreed to arrange f/u for further evaluation and input. Pt is not septic appearing and meets no criteria for inpatient management or admission. Plan to follow up with PCP in 2 days for recheck and return precautions discussed for worsening or new concerning symptoms.   Leo Grosser, MD 01/22/16 [phone]

## 2016-01-22 LAB — COMPLETE METABOLIC PANEL WITH GFR
ALBUMIN: 3.5 g/dL — AB (ref 3.6–5.1)
ALK PHOS: 141 U/L — AB (ref 40–115)
ALT: 101 U/L — AB (ref 9–46)
AST: 154 U/L — AB (ref 10–35)
BUN: 13 mg/dL (ref 7–25)
CALCIUM: 8.8 mg/dL (ref 8.6–10.3)
CO2: 24 mmol/L (ref 20–31)
CREATININE: 1.12 mg/dL (ref 0.70–1.33)
Chloride: 99 mmol/L (ref 98–110)
GFR, Est African American: 84 mL/min (ref >=60)
GFR, Est Non African American: 73 mL/min (ref >=60)
GLUCOSE: 191 mg/dL — AB (ref 65–99)
POTASSIUM: 3.6 mmol/L (ref 3.5–5.3)
SODIUM: 135 mmol/L (ref 135–146)
TOTAL PROTEIN: 6.8 g/dL (ref 6.1–8.1)
Total Bilirubin: 0.7 mg/dL (ref 0.2–1.2)

## 2016-01-22 LAB — LACTATE DEHYDROGENASE
LDH: 367 U/L — ABNORMAL HIGH (ref 98–192)
LDH: 437 U/L — ABNORMAL HIGH (ref 94–250)

## 2016-01-22 LAB — MONONUCLEOSIS SCREEN: MONO SCREEN: NEGATIVE

## 2016-01-22 LAB — C-REACTIVE PROTEIN: CRP: 5.2 mg/dL — ABNORMAL HIGH (ref <1.0)

## 2016-01-22 LAB — SEDIMENTATION RATE: Sed Rate: 47 mm/hr — ABNORMAL HIGH (ref 0–16)

## 2016-01-22 LAB — HIV ANTIBODY (ROUTINE TESTING W REFLEX): HIV: NONREACTIVE

## 2016-01-22 LAB — BRAIN NATRIURETIC PEPTIDE: Brain Natriuretic Peptide: 42.2 pg/mL (ref <100)

## 2016-01-22 NOTE — Discharge Instructions (Signed)
Fever, Adult A fever is an increase in the body's temperature. It is usually defined as a temperature of 100F (38C) or higher. Brief mild or moderate fevers generally have no long-term effects, and they often do not require treatment. Moderate or high fevers may make you feel uncomfortable and can sometimes be a sign of a serious illness or disease. The sweating that may occur with repeated or prolonged fever may also cause dehydration. Fever is confirmed by taking a temperature with a thermometer. A measured temperature can vary with:  Age.  Time of day.  Location of the thermometer:  Mouth (oral).  Rectum (rectal).  Ear (tympanic).  Underarm (axillary).  Forehead (temporal). HOME CARE INSTRUCTIONS Pay attention to any changes in your symptoms. Take these actions to help with your condition:  Take over-the counter and prescription medicines only as told by your health care provider. Follow the dosing instructions carefully.  If you were prescribed an antibiotic medicine, take it as told by your health care provider. Do not stop taking the antibiotic even if you start to feel better.  Rest as needed.  Drink enough fluid to keep your urine clear or pale yellow. This helps to prevent dehydration.  Sponge yourself or bathe with room-temperature water to help reduce your body temperature as needed. Do not use ice water.  Do not overbundle yourself in blankets or heavy clothes. SEEK MEDICAL CARE IF:  You vomit.  You cannot eat or drink without vomiting.  You have diarrhea.  You have pain when you urinate.  Your symptoms do not improve with treatment.  You develop new symptoms.  You develop excessive weakness. SEEK IMMEDIATE MEDICAL CARE IF:  You have shortness of breath or have trouble breathing.  You are dizzy or you faint.  You are disoriented or confused.  You develop signs of dehydration, such as a dry mouth, decreased urination, or paleness.  You develop  severe pain in your abdomen.  You have persistent vomiting or diarrhea.  You develop a skin rash.  Your symptoms suddenly get worse.   This information is not intended to replace advice given to you by your health care provider. Make sure you discuss any questions you have with your health care provider.   Document Released: 12/29/2000 Document Revised: 03/26/2015 Document Reviewed: 08/29/2014 Elsevier Interactive Patient Education 2016 Elsevier Inc.  

## 2016-01-23 ENCOUNTER — Ambulatory Visit (INDEPENDENT_AMBULATORY_CARE_PROVIDER_SITE_OTHER): Payer: BLUE CROSS/BLUE SHIELD | Admitting: Family Medicine

## 2016-01-23 VITALS — BP 115/76 | HR 100 | Temp 97.8°F | Resp 18 | Ht 70.0 in | Wt 280.0 lb

## 2016-01-23 DIAGNOSIS — D72820 Lymphocytosis (symptomatic): Secondary | ICD-10-CM

## 2016-01-23 DIAGNOSIS — Q639 Congenital malformation of kidney, unspecified: Secondary | ICD-10-CM

## 2016-01-23 DIAGNOSIS — R5383 Other fatigue: Secondary | ICD-10-CM

## 2016-01-23 DIAGNOSIS — R945 Abnormal results of liver function studies: Secondary | ICD-10-CM

## 2016-01-23 DIAGNOSIS — R7989 Other specified abnormal findings of blood chemistry: Secondary | ICD-10-CM | POA: Diagnosis not present

## 2016-01-23 DIAGNOSIS — R509 Fever, unspecified: Secondary | ICD-10-CM

## 2016-01-23 LAB — URINE CULTURE: Culture: NO GROWTH

## 2016-01-23 LAB — POCT CBC
GRANULOCYTE PERCENT: 34.3 % — AB (ref 37–80)
HEMATOCRIT: 38.3 % — AB (ref 43.5–53.7)
Hemoglobin: 13.4 g/dL — AB (ref 14.1–18.1)
Lymph, poc: 7.4 — AB (ref 0.6–3.4)
MCH, POC: 28.7 pg (ref 27–31.2)
MCHC: 35.1 g/dL (ref 31.8–35.4)
MCV: 81.7 fL (ref 80–97)
MID (cbc): 1.3 — AB (ref 0–0.9)
MPV: 6 fL (ref 0–99.8)
POC GRANULOCYTE: 4.6 (ref 2–6.9)
POC LYMPH %: 56 % — AB (ref 10–50)
POC MID %: 9.7 % (ref 0–12)
Platelet Count, POC: 306 10*3/uL (ref 142–424)
RBC: 4.68 M/uL — AB (ref 4.69–6.13)
RDW, POC: 14.3 %
WBC: 13.3 10*3/uL — AB (ref 4.6–10.2)

## 2016-01-23 LAB — EPSTEIN-BARR VIRUS VCA ANTIBODY PANEL
EBV NA IgG: 26.6 U/mL — ABNORMAL HIGH
EBV VCA IGM: 89.4 U/mL — AB
EBV VCA IgG: 190 U/mL — ABNORMAL HIGH

## 2016-01-23 LAB — HEPATITIS PANEL, ACUTE
HCV Ab: <0.1 s/co ratio (ref 0.0–0.9)
HEP A IGM: NEGATIVE
HEP B C IGM: NEGATIVE
HEP B S AG: NEGATIVE

## 2016-01-23 LAB — CMV IGM

## 2016-01-23 LAB — PATHOLOGIST SMEAR REVIEW

## 2016-01-23 LAB — SEDIMENTATION RATE: SED RATE: 34 mm/h — AB (ref 0–20)

## 2016-01-23 NOTE — Patient Instructions (Addendum)
     IF you received an x-ray today, you will receive an invoice from [facility]. Please contact [facility] at [phone] with questions or concerns regarding your invoice.   IF you received labwork today, you will receive an invoice from [employer]. Please contact [facility] at [phone] with questions or concerns regarding your invoice.   Our billing staff will not be able to assist you with questions regarding bills from these companies.  You will be contacted with the lab results as soon as they are available. The fastest way to get your results is to activate your My Chart account. Instructions are located on the last page of this paperwork. If you have not heard from [place] regarding the results in 2 weeks, please contact this office.    Blood count is stable, and with your improved energy and overall feeling better, no other new testing needed at this time. I did send off another liver test, and should have those results in the next few days. You can continue to use ibuprofen 400 -600 mg every 6 hours as needed for fever, fluids, and rest as needed. Follow-up with me on Monday afternoon, or if any worsening this weekend return here or emergency room. There are still some other tests that are pending, but as you're improving, can follow-up on these next week. I did order the MRI for the possible abnormal area on the kidney, but this can be done in the next week or 2. You should also be receiving a phone call from the infectious disease doctor for follow-up.   Return to the clinic or go to the nearest emergency room if any of your symptoms worsen or new symptoms occur.

## 2016-01-23 NOTE — Progress Notes (Addendum)
By signing my name below, I, Brandon Rich, attest that this documentation has been prepared under the direction and in the presence of Brandon Ray, MD.  Electronically Signed: Verlee Rich, Medical Scribe. 01/23/2016. 10:42 AM.  Subjective:    Patient ID: Brandon Rich Feeling, male    DOB: 09-Oct-1960, 55 y.o.   MRN: [id number]  HPI Chief Complaint  Patient presents with  . Follow-up    Dyspnea    HPI Comments: Brandon Rich is a 55 y.o. male with a PMHx of HTN who presents to the [facility] for a follow-up of fevers- see details at last visit. Briefly approximately 18 days of fever initial myalgias, HA, fatigue, and then cough. He had a negative flu testing, nl CBC test. Elevated WBC at visit 2 days ago with dyspnea and abdominal distention. He was evaluated through the emergency room. He had a CT of his abdomen, small amount of ascites, 2 gall stones without obstruction, and a abnormality on the right kidney-possible cyst, with plan for an MRI as an outpatient. Elevated LFTs and lymphocyte predominiate leukocytosis. He was given some IV fluids in the ER and the ER physician discussed with infectious disease with plan for outpatient work up and plan to follow-up with IV. Recent labs as noted below. Urine culture was negative, pathologist review showed atypical lymphocytosis. Sodium 132, with glucose 180, blood cultures are pending, acute hepatitis panel negative, mono screen negative, but titers pending for mono and CMV as well as RMSF and lyme. LDH was elevated at 367 at the ER, as well as CRP and SED rate. No acute disease from CXR from 2 days ago. No bacterial source apparent so was not started on abx.  When his fever is at the highest, he feels pain in the back of his eyes. Pt had a fever of 101 last night. Pt reports fatigue onset 2 weeks, improving SOB, and has been getting his appetite back. Pt is taking ibuprofen every 6 hours for his fever. Pt states he noticed  he's getting his energy back when he was at work yesterday, but felt fatigue around 5 pm when he got off- pt works at [employer] and does a lot of walking. Pt was able to do some work at home yesterday. Pt states he feels better than he did 2 days ago. Pt states his abdomen feels the same as the last office.  Pt denies fever this morning, abdominal pain, neck pain, and neck stiffness. Pt denies having a Psychologist, forensic.  Results for orders placed or performed during the hospital encounter of 01/21/16  Urine culture  Result Value Ref Range   Specimen Description URINE, RANDOM    Special Requests NONE    Culture NO GROWTH    Report Status 01/23/2016 FINAL   CBC with Differential  Result Value Ref Range   WBC 14.5 (H) 4.0 - 10.5 K/uL   RBC 4.64 4.22 - 5.81 MIL/uL   Hemoglobin 13.1 13.0 - 17.0 g/dL   HCT 39.0 39.0 - 52.0 %   MCV 84.1 78.0 - 100.0 fL   MCH 28.2 26.0 - 34.0 pg   MCHC 33.6 30.0 - 36.0 g/dL   RDW 14.3 11.5 - 15.5 %   Platelets 285 150 - 400 K/uL   Neutrophils Relative % 28 %   Lymphocytes Relative 61 %   Monocytes Relative 8 %   Eosinophils Relative 1 %   Basophils Relative 2 %   Neutro Abs 4.1 1.7 -  7.7 K/uL   Lymphs Abs 8.8 (H) 0.7 - 4.0 K/uL   Monocytes Absolute 1.2 (H) 0.1 - 1.0 K/uL   Eosinophils Absolute 0.1 0.0 - 0.7 K/uL   Basophils Absolute 0.3 (H) 0.0 - 0.1 K/uL   RBC Morphology POLYCHROMASIA PRESENT    WBC Morphology ATYPICAL LYMPHOCYTES   Comprehensive metabolic panel  Result Value Ref Range   Sodium 132 (L) 135 - 145 mmol/L   Potassium 3.5 3.5 - 5.1 mmol/L   Chloride 101 101 - 111 mmol/L   CO2 20 (L) 22 - 32 mmol/L   Glucose, Bld 180 (H) 65 - 99 mg/dL   BUN 12 6 - 20 mg/dL   Creatinine, Ser 1.04 0.61 - 1.24 mg/dL   Calcium 9.2 8.9 - 10.3 mg/dL   Total Protein 7.1 6.5 - 8.1 g/dL   Albumin 2.9 (L) 3.5 - 5.0 g/dL   AST 156 (H) 15 - 41 U/L   ALT 100 (H) 17 - 63 U/L   Alkaline Phosphatase 127 (H) 38 - 126 U/L   Total Bilirubin 0.8 0.3 - 1.2 mg/dL   GFR calc  non Af Amer >60 >60 mL/min   GFR calc Af Amer >60 >60 mL/min   Anion gap 11 5 - 15  Urinalysis, Routine w reflex microscopic (not at [facility])  Result Value Ref Range   Color, Urine YELLOW YELLOW   APPearance CLEAR CLEAR   Specific Gravity, Urine 1.042 (H) 1.005 - 1.030   pH 5.0 5.0 - 8.0   Glucose, UA >1000 (A) NEGATIVE mg/dL   Hgb urine dipstick NEGATIVE NEGATIVE   Bilirubin Urine NEGATIVE NEGATIVE   Ketones, ur 15 (A) NEGATIVE mg/dL   Protein, ur NEGATIVE NEGATIVE mg/dL   Nitrite NEGATIVE NEGATIVE   Leukocytes, UA NEGATIVE NEGATIVE  Urine microscopic-add on  Result Value Ref Range   Squamous Epithelial / LPF 0-5 (A) NONE SEEN   WBC, UA NONE SEEN 0 - 5 WBC/hpf   RBC / HPF NONE SEEN 0 - 5 RBC/hpf   Bacteria, UA RARE (A) NONE SEEN   Casts GRANULAR CAST (A) NEGATIVE  Hepatitis panel, acute  Result Value Ref Range   Hepatitis B Surface Ag Negative Negative   HCV Ab <0.1 0.0 - 0.9 s/co ratio   Hep A IgM Negative Negative   Hep B C IgM Negative Negative  Sedimentation rate  Result Value Ref Range   Sed Rate 47 (H) 0 - 16 mm/hr  C-reactive protein  Result Value Ref Range   CRP 5.2 (H) <1.0 mg/dL  Lactate dehydrogenase  Result Value Ref Range   LDH 367 (H) 98 - 192 U/L  Mononucleosis screen  Result Value Ref Range   Mono Screen NEGATIVE NEGATIVE  Pathologist smear review  Result Value Ref Range   Path Review Atypical lymphocytosis.    Patient Active Problem List   Diagnosis Date Noted  . HTN (hypertension) 12/20/2011  . Diabetes mellitus ([place]) 12/20/2011  . Dyslipidemia 12/20/2011   Past Medical History  Diagnosis Date  . Hypertension   . Hyperlipidemia   . Diabetes mellitus   . Glaucoma   . Allergy    Past Surgical History  Procedure Laterality Date  . Hernia repair    . Spine surgery    . Tumor removal     No Known Allergies Prior to Admission medications   Medication Sig Start Date End Date Taking? Authorizing Provider  Ascorbic Acid (VITAMIN C) 1000 MG  tablet Take 1,000 mg by mouth daily.  Yes Historical Provider, MD  aspirin 81 MG tablet Take 81 mg by mouth daily.   Yes Historical Provider, MD  atorvastatin (LIPITOR) 20 MG tablet Take 1 tablet (20 mg total) by mouth daily. 06/16/15  Yes Orma Flaming, MD  brimonidine (ALPHAGAN) 0.2 % ophthalmic solution Place 1 drop into both eyes 2 (two) times daily.   Yes Historical Provider, MD  calcium carbonate (TUMS - DOSED IN MG ELEMENTAL CALCIUM) 500 MG chewable tablet Chew 2 tablets by mouth daily as needed for indigestion or heartburn.   Yes Historical Provider, MD  canagliflozin (INVOKANA) 300 MG TABS tablet Take 300 mg by mouth daily before breakfast. 06/16/15  Yes Orma Flaming, MD  fexofenadine (ALLEGRA) 180 MG tablet Take 180 mg by mouth daily.   Yes Historical Provider, MD  fluticasone (FLONASE) 50 MCG/ACT nasal spray PLACE 2 SPRAYS INTO BOTH NOSTRILS DAILY. 07/09/14  Yes Orma Flaming, MD  ibuprofen (ADVIL,MOTRIN) 600 MG tablet Take 600 mg by mouth every 6 (six) hours as needed.   Yes Historical Provider, MD  Liraglutide (VICTOZA) 18 MG/3ML SOPN USE AS DIRECTED INJECTING1.8MG  SUBCUTANEOUSLY ONCEDAILY 10/31/15  Yes Mancel Bale, PA-C  lisinopril (PRINIVIL,ZESTRIL) 20 MG tablet Take 1 tablet (20 mg total) by mouth daily. 06/16/15  Yes Orma Flaming, MD  metFORMIN (GLUCOPHAGE) 1000 MG tablet Take 1 tablet (1,000 mg total) by mouth 2 (two) times daily with a meal. 06/16/15  Yes Orma Flaming, MD  Multiple Vitamin (MULTIVITAMIN) tablet Take 1 tablet by mouth every evening.    Yes Historical Provider, MD  timolol (BETIMOL) 0.5 % ophthalmic solution Place 1 drop into both eyes at bedtime.    Yes Historical Provider, MD   Social History   Social History  . Marital Status: Unknown    Spouse Name: N/A  . Number of Children: 2  . Years of Education: 16   Occupational History  . Tour manager    Social History Main Topics  . Smoking status: Never Smoker   . Smokeless tobacco: Not on file   . Alcohol Use: No     Comment: 1 drink  . Drug Use: No  . Sexual Activity:    Partners: Female   Other Topics Concern  . Not on file   Social History Narrative   Married. Education: [employer].    Review of Systems  Constitutional: Positive for appetite change and fatigue.  Eyes: Positive for pain.  Respiratory: Positive for shortness of breath.   Gastrointestinal: Negative for abdominal pain.  Musculoskeletal: Negative for neck pain and neck stiffness.  Neurological: Positive for headaches.   Objective:  BP 115/76 mmHg  Pulse 100  Temp(Src) 97.8 F (36.6 C) (Oral)  Resp 18  Ht 5\' 10"  (1.778 m)  Wt 280 lb (127.007 kg)  BMI 40.18 kg/m2  SpO2 93%  Physical Exam  Constitutional: He appears well-developed and well-nourished. No distress.  HENT:  Head: Normocephalic and atraumatic.  Eyes: Conjunctivae are normal.  Neck: Neck supple.  Cardiovascular: Normal rate.   Pulmonary/Chest: Effort normal and breath sounds normal. No respiratory distress. He has no wheezes. He has no rales.  Abdominal: Soft. There is no tenderness.  Abdomen protuberant  Neurological: He is alert.  Skin: Skin is warm and dry.  Psychiatric: He has a normal mood and affect. His behavior is normal.  Nursing note and vitals reviewed.  Results for orders placed or performed in visit on 01/23/16  POCT CBC  Result Value Ref Range   WBC  13.3 (A) 4.6 - 10.2 K/uL   Lymph, poc 7.4 (A) 0.6 - 3.4   POC LYMPH PERCENT 56.0 (A) 10 - 50 %L   MID (cbc) 1.3 (A) 0 - 0.9   POC MID % 9.7 0 - 12 %M   POC Granulocyte 4.6 2 - 6.9   Granulocyte percent 34.3 (A) 37 - 80 %G   RBC 4.68 (A) 4.69 - 6.13 M/uL   Hemoglobin 13.4 (A) 14.1 - 18.1 g/dL   HCT, POC 38.3 (A) 43.5 - 53.7 %   MCV 81.7 80 - 97 fL   MCH, POC 28.7 27 - 31.2 pg   MCHC 35.1 31.8 - 35.4 g/dL   RDW, POC 14.3 %   Platelet Count, POC 306 142 - 424 K/uL   MPV 6.0 0 - 99.8 fL   Assessment & Plan:  Brandon Rich is a 55 y.o. male Fever,  unspecified  Fever of unknown origin - Plan: POCT CBC  Other fatigue - Plan: POCT CBC  Elevated LFTs - Plan: COMPLETE METABOLIC PANEL WITH GFR  Renal structural abnormality - Plan: MR Abdomen W Contrast  Lymphocytosis - Plan: POCT CBC  Fever without known source, may still be viral illness. Previous labs reviewed, culture still pending. Titers still pending. Symptomatically he is much improved, with increase in energy, decreasing fevers. Leukocytosis is stable. Tablet-continue symptomatic care with IV present as needed, fluids and rest, and recheck in 3 days. If any worsening of symptoms prior to that appointment, follow-up here or the emergency room. Infectious disease appointment also to be scheduled, but he has not heard from them yet.  No orders of the defined types were placed in this encounter.   Patient Instructions       IF you received an x-Rich today, you will receive an invoice from [facility]. Please contact [facility] Radiology at [phone] with questions or concerns regarding your invoice.   IF you received labwork today, you will receive an invoice from [employer]. Please contact [facility] at [phone] with questions or concerns regarding your invoice.   Our billing staff will not be able to assist you with questions regarding bills from these companies.  You will be contacted with the lab results as soon as they are available. The fastest way to get your results is to activate your My Chart account. Instructions are located on the last page of this paperwork. If you have not heard from [place] regarding the results in 2 weeks, please contact this office.    Blood count is stable, and with your improved energy and overall feeling better, no other new testing needed at this time. I did send off another liver test, and should have those results in the next few days. You can continue to use ibuprofen 400 -600 mg every 6 hours as needed  for fever, fluids, and rest as needed. Follow-up with me on Monday afternoon, or if any worsening this weekend return here or emergency room. There are still some other tests that are pending, but as you're improving, can follow-up on these next week. I did order the MRI for the possible abnormal area on the kidney, but this can be done in the next week or 2. You should also be receiving a phone call from the infectious disease doctor for follow-up.   Return to the clinic or go to the nearest emergency room if any of your symptoms worsen or new symptoms occur.     I personally performed the  services described in this documentation, which was scribed in my presence. The recorded information has been reviewed and considered, and addended by me as needed.   Signed,   Brandon Ray, MD [facility].  01/24/2016 11:18 AM

## 2016-01-24 LAB — COMPLETE METABOLIC PANEL WITH GFR
ALBUMIN: 3.2 g/dL — AB (ref 3.6–5.1)
ALK PHOS: 121 U/L — AB (ref 40–115)
ALT: 72 U/L — ABNORMAL HIGH (ref 9–46)
AST: 114 U/L — AB (ref 10–35)
BUN: 14 mg/dL (ref 7–25)
CO2: 19 mmol/L — AB (ref 20–31)
Calcium: 8.5 mg/dL — ABNORMAL LOW (ref 8.6–10.3)
Chloride: 104 mmol/L (ref 98–110)
Creat: 0.97 mg/dL (ref 0.70–1.33)
GFR, EST NON AFRICAN AMERICAN: 87 mL/min (ref >=60)
GFR, Est African American: >89 mL/min (ref >=60)
GLUCOSE: 206 mg/dL — AB (ref 65–99)
POTASSIUM: 3.4 mmol/L — AB (ref 3.5–5.3)
SODIUM: 136 mmol/L (ref 135–146)
Total Bilirubin: 0.6 mg/dL (ref 0.2–1.2)
Total Protein: 6.4 g/dL (ref 6.1–8.1)

## 2016-01-24 LAB — RICKETTSIA AB PNL W/RFX TO TITER
R. typhi IgG: NOT DETECTED
R. typhi IgM: NOT DETECTED
RMSF IGG: NOT DETECTED
RMSF IGM: NOT DETECTED

## 2016-01-26 ENCOUNTER — Ambulatory Visit (INDEPENDENT_AMBULATORY_CARE_PROVIDER_SITE_OTHER): Payer: BLUE CROSS/BLUE SHIELD | Admitting: Family Medicine

## 2016-01-26 ENCOUNTER — Telehealth: Payer: Self-pay

## 2016-01-26 VITALS — BP 124/80 | HR 105 | Temp 100.5°F | Resp 18 | Ht 70.0 in | Wt 282.0 lb

## 2016-01-26 DIAGNOSIS — R188 Other ascites: Secondary | ICD-10-CM | POA: Diagnosis not present

## 2016-01-26 DIAGNOSIS — B259 Cytomegaloviral disease, unspecified: Secondary | ICD-10-CM | POA: Diagnosis not present

## 2016-01-26 DIAGNOSIS — R197 Diarrhea, unspecified: Secondary | ICD-10-CM | POA: Diagnosis not present

## 2016-01-26 DIAGNOSIS — E876 Hypokalemia: Secondary | ICD-10-CM | POA: Diagnosis not present

## 2016-01-26 DIAGNOSIS — D72829 Elevated white blood cell count, unspecified: Secondary | ICD-10-CM | POA: Diagnosis not present

## 2016-01-26 DIAGNOSIS — M25562 Pain in left knee: Secondary | ICD-10-CM

## 2016-01-26 DIAGNOSIS — R740 Nonspecific elevation of levels of transaminase and lactic acid dehydrogenase [LDH]: Secondary | ICD-10-CM

## 2016-01-26 DIAGNOSIS — R7401 Elevation of levels of liver transaminase levels: Secondary | ICD-10-CM

## 2016-01-26 DIAGNOSIS — M25561 Pain in right knee: Secondary | ICD-10-CM

## 2016-01-26 DIAGNOSIS — Q639 Congenital malformation of kidney, unspecified: Secondary | ICD-10-CM

## 2016-01-26 NOTE — Telephone Encounter (Signed)
DOne

## 2016-01-26 NOTE — Telephone Encounter (Signed)
[facility] needs the patient's MRI Abdomen changed to an MRI Abdomen With and Without Contrast.  It cannot be scheduled until this change is made.  Thank you.  CB# for [facility]: [phone]

## 2016-01-26 NOTE — Patient Instructions (Addendum)
IF you received an x-ray today, you will receive an invoice from [facility]. Please contact [facility] Radiology at [phone] with questions or concerns regarding your invoice.   IF you received labwork today, you will receive an invoice from [employer]. Please contact [facility] at [phone] with questions or concerns regarding your invoice.   Our billing staff will not be able to assist you with questions regarding bills from these companies.  You will be contacted with the lab results as soon as they are available. The fastest way to get your results is to activate your My Chart account. Instructions are located on the last page of this paperwork. If you have not heard from [place] regarding the results in 2 weeks, please contact this office.    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 This may explain many of the symptoms you have had. See information this below. As we discussed, you need to avoid anyone who is pregnant or may be becoming pregnant for at least the next few weeks. I will recheck your electrolytes, potassium, liver tests, blood count.  I will also discuss with infectious disease if other treatment or testing needed at this time. Additionally, return with the stool culture tomorrow to make sure that you have not acquired an infectious diarrhea from previous antibiotic use. Follow-up with me on Friday, sooner if any worsening symptoms.  Cytomegalovirus, Adult Cytomegalovirus (CMV) is a very common virus (a germ that can cause illness) that infects most adults in the [place] by the time they are 56 years old. Most people who have or have had CMV infection never know it because infection with this virus most often does not cause any symptoms. However, once you are infected with CMV, you will have the virus in your body for the rest of your life. Most of the time, and in most people, CMV causes no health problems. However, in certain  people, infection can be dangerous. This includes:  Women who are pregnant or plan to be pregnant.  CMV can be passed to an unborn or newly born child and cause health problems soon after birth or as they grow up.  People with a weak immune system (helps the body fight off diseases).  This includes people on drugs that kill cancer cells (chemotherapy), people infected with HIV (the virus that causes AIDS), people who have had an organ transplant (an operation to give you a new body organ, such as a kidney) or bone marrow transplant and people receiving drugs that suppress the immune system. Anyone who has had CMV infection can pass it on to others. The virus is passed from person to person because it is in body fluids. These include blood, urine, breast milk, spit (saliva) and mens' sexual fluid that contains sperm (semen). If you have had CMV, it is important to take steps to keep from spreading the virus to others. CAUSES  Adults become infected with CMV by coming in contact with body fluids from someone who already has it. CMV is not passed on by casual contact (such as shaking hands, hugging, kissing on the cheek). It is passed only through body fluids. This can happen if you:  Get body fluids from an infected person on your hands and then rub your eyes or touch the inside of your nose or mouth.  Have sex with someone who has had CMV.  William Dalton someone with the virus. That person's saliva would have to enter  your mouth.  Receive a blood transfusion. Tests for CMV in blood are not always 100% accurate.  Have an organ or bone marrow transplant. If the donor had CMV, you could become infected, too. SYMPTOMS  Many people with CMV live their whole life and never have symptoms. If symptoms do occur, they may include:  Fever that lasts many days.  Sore throat.  Sore or enlarged glands in the neck and elsewhere in your body (lymph nodes).  Achy muscles.  Feeling very tired.  Feeling  weak.  Headache.  Weight loss. People who have weak immune systems usually have more severe symptoms. For these patients, the virus may affect 1 part of the body. For example:  The stomach or intestines, causing an infection (gastroenteritis).  The liver, causing liver disease (hepatitis).  The lungs, causing an illness that makes breathing difficult (pneumonia).  The eyes, making it hard to see.  The brain, causing seizures or coma. This is rare. DIAGNOSIS  To decide if you have or have had CMV infection, your caregiver will probably:  Ask about symptoms you have noticed.  Ask about your overall health.  Do a physical exam.  Order blood tests. They can check for:  Antibodies to the virus. These are substances that fight CMV. They will be in your blood if you have had the virus infection.  Antibodies to the mononucleosis virus that causes a disease with similar symptoms.  The presence of the CMV virus itself. TREATMENT   No treatment will make the CMV virus go totally away from an infected person. However, many symptoms can be treated. Antiviral drugs may make complications of the infection less severe and can keep the virus from spreading in your body and causing more symptoms.  The medications are sometimes given by IV (through a needle that puts the drug in your vein). This would be done in a hospital or clinic. Sometimes the drugs are taken in pill form. The drugs can have side effects. Talk with your caregiver about the benefits and risks of these drugs.  Immunoglobulin (an antibody) may be prescribed for very bad infections. A high dose often is given. The goal is to help the body keep the virus under control. HOME CARE INSTRUCTIONS   Wash your hands often.  Throw away used tissues. This will keep other people from coming into contact with the virus.  Take any medications prescribed by your caregiver. Follow the directions carefully.  If you also have a weak  immune system make sure you understand how to protect your health. Ask your caregiver if there is anything you should or should not do. SEEK MEDICAL CARE IF:   You have questions about any medication that has been prescribed or suggested.  You develop mild symptoms.  You have an oral temperature above 102F (38.9C).  You are extremely tired.  Your muscles and your head ache.  You develop more severe symptoms.  You become confused.  You are very irritable.  Your muscles are unusually weak.  Your vision becomes blurry. You have trouble seeing.   This information is not intended to replace advice given to you by your health care provider. Make sure you discuss any questions you have with your health care provider.   Document Released: 06/23/2009 Document Revised: 07/26/2014 Document Reviewed: 01/01/2015 Elsevier Interactive Patient Education [employer].

## 2016-01-26 NOTE — Progress Notes (Signed)
Subjective:  By signing my name below, I, Brandon Rich, attest that this documentation has been prepared under the direction and in the presence of Brandon Ray, MD. Electronically Signed: [name], [place]. 01/26/2016 , 7:34 PM .  Patient was seen in Room 11 .   Patient ID: Brandon Rich, male    DOB: October 23, 1960, 55 y.o.   MRN: [id number] Chief Complaint  Patient presents with  . Follow-up    fever   HPI Brandon Rich is a 55 y.o. male Here for follow up of fever without source. See last visit. He's had multiple testings both here and the ER. Significant clinical improvement last visit with decreased WBC at office visit 3 days ago. Rich cultures without growth x4 days. Negative hepatitis panel. LFT's slightly improved but still elevated at last visit. Monospot testing was negative, however, results notable positive CMV IgM. His potassium was slightly low at 3.4.   Patient states he's still feeling about the same level of energy as last office visit 3 days ago. He's been resting since that visit. He's brought in his temperature measurements with him: Tmax 101.4 on Friday morning (3 days ago), 100.3 yesterday and 100.5 today. He has an occasional dry cough and loose stools about 6 times a day. He denies Rich in his stools. He denies abdominal pains, abdominal distention, nausea, or yellowing of his skin.   He also reports cramping in his legs. He feels the cramping in his legs when he sits up or stands up after he's been resting.   Patient Active Problem List   Diagnosis Date Noted  . HTN (hypertension) 12/20/2011  . Diabetes mellitus ([place]) 12/20/2011  . Dyslipidemia 12/20/2011   Past Medical History  Diagnosis Date  . Hypertension   . Hyperlipidemia   . Diabetes mellitus   . Glaucoma   . Allergy    Past Surgical History  Procedure Laterality Date  . Hernia repair    . Spine surgery    . Tumor removal     No Known Allergies Prior to Admission medications     Medication Sig Start Date End Date Taking? Authorizing Provider  Ascorbic Acid (VITAMIN C) 1000 MG tablet Take 1,000 mg by mouth daily.    Historical Provider, MD  aspirin 81 MG tablet Take 81 mg by mouth daily.    Historical Provider, MD  atorvastatin (LIPITOR) 20 MG tablet Take 1 tablet (20 mg total) by mouth daily. 06/16/15   Orma Flaming, MD  brimonidine (ALPHAGAN) 0.2 % ophthalmic solution Place 1 drop into both eyes 2 (two) times daily.    Historical Provider, MD  calcium carbonate (TUMS - DOSED IN MG ELEMENTAL CALCIUM) 500 MG chewable tablet Chew 2 tablets by mouth daily as needed for indigestion or heartburn.    Historical Provider, MD  canagliflozin (INVOKANA) 300 MG TABS tablet Take 300 mg by mouth daily before breakfast. 06/16/15   Orma Flaming, MD  fexofenadine (ALLEGRA) 180 MG tablet Take 180 mg by mouth daily.    Historical Provider, MD  fluticasone (FLONASE) 50 MCG/ACT nasal spray PLACE 2 SPRAYS INTO BOTH NOSTRILS DAILY. 07/09/14   Orma Flaming, MD  ibuprofen (ADVIL,MOTRIN) 600 MG tablet Take 600 mg by mouth every 6 (six) hours as needed.    Historical Provider, MD  Liraglutide (VICTOZA) 18 MG/3ML SOPN USE AS DIRECTED INJECTING1.8MG  SUBCUTANEOUSLY ONCEDAILY 10/31/15   Mancel Bale, PA-C  lisinopril (PRINIVIL,ZESTRIL) 20 MG tablet Take 1 tablet (20 mg total) by mouth  daily. 06/16/15   Orma Flaming, MD  metFORMIN (GLUCOPHAGE) 1000 MG tablet Take 1 tablet (1,000 mg total) by mouth 2 (two) times daily with a meal. 06/16/15   Orma Flaming, MD  Multiple Vitamin (MULTIVITAMIN) tablet Take 1 tablet by mouth every evening.     Historical Provider, MD  timolol (BETIMOL) 0.5 % ophthalmic solution Place 1 drop into both eyes at bedtime.     Historical Provider, MD   Social History   Social History  . Marital Status: Unknown    Spouse Name: N/A  . Number of Children: 2  . Years of Education: 16   Occupational History  . Tour manager    Social History Main Topics  .  Smoking status: Never Smoker   . Smokeless tobacco: Not on file  . Alcohol Use: No     Comment: 1 drink  . Drug Use: No  . Sexual Activity:    Partners: Female   Other Topics Concern  . Not on file   Social History Narrative   Married. Education: [employer].    Review of Systems  Constitutional: Positive for fever and fatigue. Negative for chills.  Respiratory: Positive for cough. Negative for shortness of breath and wheezing.   Gastrointestinal: Positive for diarrhea. Negative for nausea, vomiting, abdominal pain, Rich in stool and abdominal distention.  Musculoskeletal: Positive for myalgias.  Skin: Negative for color change and rash.       Objective:   Physical Exam  Constitutional: He is oriented to person, place, and time. He appears well-developed and well-nourished. No distress.  HENT:  Head: Normocephalic and atraumatic.  Eyes: EOM are normal. Pupils are equal, round, and reactive to light.  Neck: Neck supple.  Cardiovascular: Normal rate.   Pulmonary/Chest: Effort normal. No respiratory distress.  Abdominal: Soft. Bowel sounds are normal. He exhibits no distension. There is no tenderness.  Musculoskeletal: Normal range of motion. He exhibits no tenderness.  Neurological: He is alert and oriented to person, place, and time.  Skin: Skin is warm and dry. No rash noted.  Psychiatric: He has a normal mood and affect. His behavior is normal.  Nursing note and vitals reviewed.   Filed Vitals:   01/26/16 1743  BP: 124/80  Pulse: 105  Temp: 100.5 F (38.1 C)  TempSrc: Oral  Resp: 18  Height: 5\' 10"  (1.778 m)  Weight: 282 lb (127.914 kg)  SpO2: 98%      Assessment & Plan:   Brandon Rich is a 55 y.o. male Cytomegalovirus infection, unspecified cytomegaloviral infection type ([place]) - Plan: CBC with Differential/Platelet, Comprehensive metabolic panel  Diarrhea, unspecified type - Plan: Gastrointestinal Pathogen Panel PCR  Hypokalemia  Transaminitis - Plan:  Comprehensive metabolic panel  Ascites - Plan: Comprehensive metabolic panel  Leukocytosis - Plan: CBC with Differential/Platelet  Arthralgia of both lower legs  Now with positive CMV IgM, may explain his illness, including ascites and transaminitis. Clinically he is improving, with some persistent fatigue, minimal cough, but tolerating fluids, and much improved from last week. Fevers are decreasing, but still low-grade fever. Abdomen soft, nontender, and lungs are clear.  -Episodic anterior leg pain may be due to meralgia paresthetica, but with borderline potassium last visit, advised him to eat a banana or other potassium rich food each day, and repeat CMP tonight.  -New symptoms of diarrhea, up to 6 episodes per day. Check GI pathogen panel to rule out C. difficile as he was on doxycycline recently, but no current abdominal pain, nausea,  vomiting. Holding on antibiotics for now. If any worsening, consider Flagyl or oral vancomycin.  - Continue symptomatic care, fluids, rest, and will discuss with infectious disease if any other testing or treatment needed at this point with positive CMV testing. Additionally advised to avoid those that are pregnant or may becoming pregnant soon.  - recheck 4 days, sooner if worse.   No orders of the defined types were placed in this encounter.   Patient Instructions       IF you received an x-Rich today, you will receive an invoice from [facility]. Please contact [facility] at [phone] with questions or concerns regarding your invoice.   IF you received labwork today, you will receive an invoice from [employer]. Please contact [facility] at [phone] with questions or concerns regarding your invoice.   Our billing staff will not be able to assist you with questions regarding bills from these companies.  You will be contacted with the lab results as soon as they are available. The fastest way to get  your results is to activate your My Chart account. Instructions are located on the last page of this paperwork. If you have not heard from [place] regarding the results in 2 weeks, please contact this office.    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 This may explain many of the symptoms you have had. See information this below. As we discussed, you need to avoid anyone who is pregnant or may be becoming pregnant for at least the next few weeks. I will recheck your electrolytes, potassium, liver tests, Rich count.  I will also discuss with infectious disease if other treatment or testing needed at this time. Additionally, return with the stool culture tomorrow to make sure that you have not acquired an infectious diarrhea from previous antibiotic use. Follow-up with me on Friday, sooner if any worsening symptoms.  Cytomegalovirus, Adult Cytomegalovirus (CMV) is a very common virus (a germ that can cause illness) that infects most adults in the [place] by the time they are 56 years old. Most people who have or have had CMV infection never know it because infection with this virus most often does not cause any symptoms. However, once you are infected with CMV, you will have the virus in your body for the rest of your life. Most of the time, and in most people, CMV causes no health problems. However, in certain people, infection can be dangerous. This includes:  Women who are pregnant or plan to be pregnant.  CMV can be passed to an unborn or newly born child and cause health problems soon after birth or as they grow up.  People with a weak immune system (helps the body fight off diseases).  This includes people on drugs that kill cancer cells (chemotherapy), people infected with HIV (the virus that causes AIDS), people who have had an organ transplant (an operation to give you a new body organ, such as a kidney) or bone marrow transplant and people receiving drugs that suppress the immune  system. Anyone who has had CMV infection can pass it on to others. The virus is passed from person to person because it is in body fluids. These include Rich, urine, breast milk, spit (saliva) and mens' sexual fluid that contains sperm (semen). If you have had CMV, it is important to take steps to keep from spreading the virus to others. CAUSES  Adults become infected with CMV by coming in contact with body  fluids from someone who already has it. CMV is not passed on by casual contact (such as shaking hands, hugging, kissing on the cheek). It is passed only through body fluids. This can happen if you:  Get body fluids from an infected person on your hands and then rub your eyes or touch the inside of your nose or mouth.  Have sex with someone who has had CMV.  William Dalton someone with the virus. That person's saliva would have to enter your mouth.  Receive a Rich transfusion. Tests for CMV in Rich are not always 100% accurate.  Have an organ or bone marrow transplant. If the donor had CMV, you could become infected, too. SYMPTOMS  Many people with CMV live their whole life and never have symptoms. If symptoms do occur, they may include:  Fever that lasts many days.  Sore throat.  Sore or enlarged glands in the neck and elsewhere in your body (lymph nodes).  Achy muscles.  Feeling very tired.  Feeling weak.  Headache.  Weight loss. People who have weak immune systems usually have more severe symptoms. For these patients, the virus may affect 1 part of the body. For example:  The stomach or intestines, causing an infection (gastroenteritis).  The liver, causing liver disease (hepatitis).  The lungs, causing an illness that makes breathing difficult (pneumonia).  The eyes, making it hard to see.  The brain, causing seizures or coma. This is rare. DIAGNOSIS  To decide if you have or have had CMV infection, your caregiver will probably:  Ask about symptoms you have  noticed.  Ask about your overall health.  Do a physical exam.  Order Rich tests. They can check for:  Antibodies to the virus. These are substances that fight CMV. They will be in your Rich if you have had the virus infection.  Antibodies to the mononucleosis virus that causes a disease with similar symptoms.  The presence of the CMV virus itself. TREATMENT   No treatment will make the CMV virus go totally away from an infected person. However, many symptoms can be treated. Antiviral drugs may make complications of the infection less severe and can keep the virus from spreading in your body and causing more symptoms.  The medications are sometimes given by IV (through a needle that puts the drug in your vein). This would be done in a hospital or clinic. Sometimes the drugs are taken in pill form. The drugs can have side effects. Talk with your caregiver about the benefits and risks of these drugs.  Immunoglobulin (an antibody) may be prescribed for very bad infections. A high dose often is given. The goal is to help the body keep the virus under control. HOME CARE INSTRUCTIONS   Wash your hands often.  Throw away used tissues. This will keep other people from coming into contact with the virus.  Take any medications prescribed by your caregiver. Follow the directions carefully.  If you also have a weak immune system make sure you understand how to protect your health. Ask your caregiver if there is anything you should or should not do. SEEK MEDICAL CARE IF:   You have questions about any medication that has been prescribed or suggested.  You develop mild symptoms.  You have an oral temperature above 102F (38.9C).  You are extremely tired.  Your muscles and your head ache.  You develop more severe symptoms.  You become confused.  You are very irritable.  Your muscles are unusually weak.  Your vision becomes blurry. You have trouble seeing.   This information is  not intended to replace advice given to you by your health care provider. Make sure you discuss any questions you have with your health care provider.   Document Released: 06/23/2009 Document Revised: 07/26/2014 Document Reviewed: 01/01/2015 Elsevier Interactive Patient Education [employer].     I personally performed the services described in this documentation, which was scribed in my presence. The recorded information has been reviewed and considered, and addended by me as needed.   Signed,   Brandon Ray, MD [facility].  01/26/2016 7:58 PM

## 2016-01-27 LAB — CBC WITH DIFFERENTIAL/PLATELET
BASOS PCT: 2 %
Basophils Absolute: 302 cells/uL — ABNORMAL HIGH (ref 0–200)
EOS ABS: 151 {cells}/uL (ref 15–500)
Eosinophils Relative: 1 %
HEMATOCRIT: 37.2 % — AB (ref 38.5–50.0)
HEMOGLOBIN: 12.5 g/dL — AB (ref 13.2–17.1)
LYMPHS ABS: 10117 {cells}/uL — AB (ref 850–3900)
LYMPHS PCT: 67 %
MCH: 28.1 pg (ref 27.0–33.0)
MCHC: 33.6 g/dL (ref 32.0–36.0)
MCV: 83.6 fL (ref 80.0–100.0)
MONO ABS: 906 {cells}/uL (ref 200–950)
MPV: 9.3 fL (ref 7.5–12.5)
Monocytes Relative: 6 %
Neutro Abs: 3624 cells/uL (ref 1500–7800)
Neutrophils Relative %: 24 %
Platelets: 288 10*3/uL (ref 140–400)
RBC: 4.45 MIL/uL (ref 4.20–5.80)
RDW: 14.6 % (ref 11.0–15.0)
WBC: 15.1 10*3/uL — ABNORMAL HIGH (ref 3.8–10.8)

## 2016-01-27 LAB — COMPREHENSIVE METABOLIC PANEL
ALBUMIN: 3.2 g/dL — AB (ref 3.6–5.1)
ALK PHOS: 101 U/L (ref 40–115)
ALT: 43 U/L (ref 9–46)
AST: 81 U/L — AB (ref 10–35)
BILIRUBIN TOTAL: 0.5 mg/dL (ref 0.2–1.2)
BUN: 14 mg/dL (ref 7–25)
CALCIUM: 8.4 mg/dL — AB (ref 8.6–10.3)
CO2: 23 mmol/L (ref 20–31)
Chloride: 104 mmol/L (ref 98–110)
Creat: 0.81 mg/dL (ref 0.70–1.33)
Glucose, Bld: 221 mg/dL — ABNORMAL HIGH (ref 65–99)
POTASSIUM: 3.5 mmol/L (ref 3.5–5.3)
Sodium: 135 mmol/L (ref 135–146)
TOTAL PROTEIN: 6.4 g/dL (ref 6.1–8.1)

## 2016-01-27 LAB — CULTURE, BLOOD (ROUTINE X 2)
Culture: NO GROWTH
Culture: NO GROWTH

## 2016-01-28 LAB — PATHOLOGIST SMEAR REVIEW

## 2016-01-30 ENCOUNTER — Ambulatory Visit (INDEPENDENT_AMBULATORY_CARE_PROVIDER_SITE_OTHER): Payer: BLUE CROSS/BLUE SHIELD | Admitting: Family Medicine

## 2016-01-30 VITALS — BP 128/82 | HR 96 | Temp 98.5°F | Resp 18 | Ht 70.0 in | Wt 284.0 lb

## 2016-01-30 DIAGNOSIS — R600 Localized edema: Secondary | ICD-10-CM | POA: Diagnosis not present

## 2016-01-30 DIAGNOSIS — D72829 Elevated white blood cell count, unspecified: Secondary | ICD-10-CM | POA: Diagnosis not present

## 2016-01-30 DIAGNOSIS — E1165 Type 2 diabetes mellitus with hyperglycemia: Secondary | ICD-10-CM | POA: Diagnosis not present

## 2016-01-30 DIAGNOSIS — B259 Cytomegaloviral disease, unspecified: Secondary | ICD-10-CM

## 2016-01-30 DIAGNOSIS — IMO0001 Reserved for inherently not codable concepts without codable children: Secondary | ICD-10-CM

## 2016-01-30 DIAGNOSIS — R509 Fever, unspecified: Secondary | ICD-10-CM | POA: Diagnosis not present

## 2016-01-30 MED ORDER — LIRAGLUTIDE 18 MG/3ML ~~LOC~~ SOPN
PEN_INJECTOR | SUBCUTANEOUS | Status: DC
Start: 2016-01-30 — End: 2016-02-27

## 2016-01-30 NOTE — Progress Notes (Addendum)
Subjective:  By signing my name below, I, Brandon Rich, attest that this documentation has been prepared under the direction and in the presence of [name], MD.  Electronically Signed: Thea Alken, ED Scribe. 01/30/2016. 5:19 PM.   Patient ID: Brandon Rich, male    DOB: 1960-12-05, 55 y.o.   MRN: [id number]  HPI Chief Complaint  Patient presents with  . Follow-up    CMV    HPI Comments: Brandon Rich is a 55 y.o. male who presents to the [facility] f/u of fever. See prior visit and work up both here and through ED. Ultimately noted to have a positive CMVIGM. So thought to have CMV infection. He has had negative blood cultures from 6/6.  Negative RMFS titter but results from lyme and EBV are still pending. Was clinically improving the past 2 visit with his fevers decreasing. WBC was slight elevated last time 15.1 but still lymphocyte predominant. The pathologist review indicated absolute lymphocytosis and atypical lymphocytes. He did note diarrhea as of last visit so GI pathogen panel was ordered. LT's improving last visit and potassium was normalized.  Pt symptoms have improved since last visit. He reports less diarrhea and firmer stools and has 2 BM a day. His appetite has improved, very little cough, fatigue/energy is improving, and SOB is gradually improving and almost resolved. He is still spiking fever which have been 99.8,100.2 and 100.0 over the past 3 days. He has not taken ibuprofen today. He reports burning to anterior, bilateral thighs, noticed more with standing initially but gradually subsides. Today he noticed leg swelling, left worse than right, which is not typical for him; no calf pain. Pt has never been on diuretics in the past. No new cough.   Diabetes.  Pt is needing a refill of victoza 18mg , metformin 1000 mg, Invokana 300mg  a day. Pt sugars range from 150-200; 120 at it' best.   Lab Results  Component Value Date   HGBA1C 7.4 06/16/2015      Lab Results  Component Value Date   MICROALBUR 4.5 06/16/2015   Pt is an Chief Executive Officer.    Patient Active Problem List   Diagnosis Date Noted  . HTN (hypertension) 12/20/2011  . Diabetes mellitus ([place]) 12/20/2011  . Dyslipidemia 12/20/2011   Past Medical History  Diagnosis Date  . Hypertension   . Hyperlipidemia   . Diabetes mellitus   . Glaucoma   . Allergy    Past Surgical History  Procedure Laterality Date  . Hernia repair    . Spine surgery    . Tumor removal     No Known Allergies Prior to Admission medications   Medication Sig Start Date End Date Taking? Authorizing Provider  Ascorbic Acid (VITAMIN C) 1000 MG tablet Take 1,000 mg by mouth daily.    Historical Provider, MD  aspirin 81 MG tablet Take 81 mg by mouth daily.    Historical Provider, MD  atorvastatin (LIPITOR) 20 MG tablet Take 1 tablet (20 mg total) by mouth daily. 06/16/15   Orma Flaming, MD  brimonidine (ALPHAGAN) 0.2 % ophthalmic solution Place 1 drop into both eyes 2 (two) times daily.    Historical Provider, MD  calcium carbonate (TUMS - DOSED IN MG ELEMENTAL CALCIUM) 500 MG chewable tablet Chew 2 tablets by mouth daily as needed for indigestion or heartburn.    Historical Provider, MD  canagliflozin (INVOKANA) 300 MG TABS tablet Take 300 mg by mouth daily before breakfast. 06/16/15  Orma Flaming, MD  fexofenadine (ALLEGRA) 180 MG tablet Take 180 mg by mouth daily.    Historical Provider, MD  fluticasone (FLONASE) 50 MCG/ACT nasal spray PLACE 2 SPRAYS INTO BOTH NOSTRILS DAILY. 07/09/14   Orma Flaming, MD  ibuprofen (ADVIL,MOTRIN) 600 MG tablet Take 600 mg by mouth every 6 (six) hours as needed.    Historical Provider, MD  Liraglutide (VICTOZA) 18 MG/3ML SOPN USE AS DIRECTED INJECTING1.8MG  SUBCUTANEOUSLY ONCEDAILY 10/31/15   Mancel Bale, PA-C  lisinopril (PRINIVIL,ZESTRIL) 20 MG tablet Take 1 tablet (20 mg total) by mouth daily. 06/16/15   Orma Flaming, MD  metFORMIN (GLUCOPHAGE) 1000  MG tablet Take 1 tablet (1,000 mg total) by mouth 2 (two) times daily with a meal. 06/16/15   Orma Flaming, MD  Multiple Vitamin (MULTIVITAMIN) tablet Take 1 tablet by mouth every evening.     Historical Provider, MD  timolol (BETIMOL) 0.5 % ophthalmic solution Place 1 drop into both eyes at bedtime.     Historical Provider, MD   Social History   Social History  . Marital Status: Unknown    Spouse Name: N/A  . Number of Children: 2  . Years of Education: 16   Occupational History  . Tour manager    Social History Main Topics  . Smoking status: Never Smoker   . Smokeless tobacco: Not on file  . Alcohol Use: No     Comment: 1 drink  . Drug Use: No  . Sexual Activity:    Partners: Female   Other Topics Concern  . Not on file   Social History Narrative   Married. Education: [employer].    Review of Systems  Constitutional: Positive for fever. Negative for appetite change and fatigue.  Respiratory: Positive for shortness of breath ( gradually improving).   Cardiovascular: Positive for leg swelling. Negative for chest pain.  Gastrointestinal: Negative for diarrhea.       Objective:   Physical Exam  Constitutional: He is oriented to person, place, and time. He appears well-developed and well-nourished. No distress.  HENT:  Head: Normocephalic and atraumatic.  Mouth/Throat: Oropharynx is clear and moist. No oropharyngeal exudate or posterior oropharyngeal erythema.  Eyes: Conjunctivae and EOM are normal.  Neck: Neck supple.  Cardiovascular: Normal rate.   Pulmonary/Chest: Effort normal.  Abdominal: Soft. There is no tenderness.  Musculoskeletal: Normal range of motion.  Tight edema with pitting to proximal tibia. Calves non tender. negative Holman's  Lymphadenopathy:    He has no cervical adenopathy.    He has no axillary adenopathy.       Right: No inguinal and no epitrochlear adenopathy present.       Left: No inguinal and no epitrochlear adenopathy present.   Neurological: He is alert and oriented to person, place, and time.  Skin: Skin is warm and dry.  Psychiatric: He has a normal mood and affect. His behavior is normal.  Nursing note and vitals reviewed.  Filed Vitals:   01/30/16 1714 01/30/16 1736  BP: 160/80 128/82  Pulse: 96   Temp: 98.5 F (36.9 C)   TempSrc: Oral   Resp: 18   Height: 5\' 10"  (1.778 m)   Weight: 284 lb (128.822 kg)   SpO2: 96%     Assessment & Plan:  Brandon Rich is a 55 y.o. male Fever, unspecified - Plan: CBC with Differential/Platelet Leukocytosis - Plan: CBC with Differential/Platelet Cytomegalovirus infection, unspecified cytomegaloviral infection type ([place])  -Suspected CMV infection, with clinical improvement as above.  Fevers are continuing to improve, transaminitis improved, fatigue and previous dyspnea are also improving. Lyme and EBV titers still pending, but syndrome appears to be still due to CMV. No apparent clinical worsening or new developments since last visit, and overall appears to be improving. Diarrhea has also resolved, but GI pathogen panel still pending.  -Continue symptomatic care, contact precautions discussed while febrile, and continue follow-up as planned with infectious disease.  - If leukocytosis increasing, can discuss further with infectious disease. Otherwise appointment pending at the end of the month. At that time he may be a candidate for convalescent titers for CMV or EBV (pending on initial results, those are still pending from lab as had to be reprocessed)  Bilateral leg edema - Plan: Basic metabolic panel, TSH  - History of obesity/overweight. However 7 lower extremity edema, bilateral. No calf pain, negative Homans. Less likely DVT with bilateral symptoms, but with possible association of DVT and CMV, will have a low threshold for ultrasound.   -advised to elevate legs tonight, then if not improving into this weekend, check ultrasound Monday. Consider imaging sooner if  worsening symptoms, or calf pain.  -check tsh, BMP  Uncontrolled type 2 diabetes mellitus without complication, without long-term current use of insulin (HCC) - Plan: Hemoglobin A1c  - Higher readings with recent illness, but will continue same regimen for now. A1c pending. Victoza was refilled, continue Invokana and metformin. Up-to-date on microalbumin.    No orders of the defined types were placed in this encounter.   Patient Instructions       IF you received an x-ray today, you will receive an invoice from [facility]. Please contact [facility] at [phone] with questions or concerns regarding your invoice.   IF you received labwork today, you will receive an invoice from [employer]. Please contact [facility] at [phone] with questions or concerns regarding your invoice.   Our billing staff will not be able to assist you with questions regarding bills from these companies.  You will be contacted with the lab results as soon as they are available. The fastest way to get your results is to activate your My Chart account. Instructions are located on the last page of this paperwork. If you have not heard from [place] regarding the results in 2 weeks, please contact this office.    As fevers are continuing to improve, I do not think there are any changes in medications needed at this time. Continue relative rest, fluids, and recheck in the next 1 week. For your swelling in the legs, try elevating legs tonight and tomorrow, but if swelling persists into Monday, we may need to check an ultrasound at that time. I am here Monday morning and Tuesday morning. If any calf pain or worsening symptoms over the weekend, return here or emergency room for discussion of ultrasound sooner.    Continue same medications for diabetes at this point.  Depending on labs, I can reach out to infectious disease prior to your scheduled appointment. I will wait to see  how these look first.    I personally performed the services described in this documentation, which was scribed in my presence. The recorded information has been reviewed and considered, and addended by me as needed.   Signed,   [name], MD [facility].  01/30/2016 6:34 PM

## 2016-01-30 NOTE — Patient Instructions (Addendum)
     IF you received an x-ray today, you will receive an invoice from [facility]. Please contact [facility] at [phone] with questions or concerns regarding your invoice.   IF you received labwork today, you will receive an invoice from [employer]. Please contact [facility] at [phone] with questions or concerns regarding your invoice.   Our billing staff will not be able to assist you with questions regarding bills from these companies.  You will be contacted with the lab results as soon as they are available. The fastest way to get your results is to activate your My Chart account. Instructions are located on the last page of this paperwork. If you have not heard from [place] regarding the results in 2 weeks, please contact this office.    As fevers are continuing to improve, I do not think there are any changes in medications needed at this time. Continue relative rest, fluids, and recheck in the next 1 week. For your swelling in the legs, try elevating legs tonight and tomorrow, but if swelling persists into Monday, we may need to check an ultrasound at that time. I am here Monday morning and Tuesday morning. If any calf pain or worsening symptoms over the weekend, return here or emergency room for discussion of ultrasound sooner.    Continue same medications for diabetes at this point.  Depending on labs, I can reach out to infectious disease prior to your scheduled appointment. I will wait to see how these look first.

## 2016-01-31 LAB — CBC WITH DIFFERENTIAL/PLATELET
BASOS PCT: 2 %
Basophils Absolute: 248 cells/uL — ABNORMAL HIGH (ref 0–200)
EOS ABS: 124 {cells}/uL (ref 15–500)
Eosinophils Relative: 1 %
HEMATOCRIT: 37.5 % — AB (ref 38.5–50.0)
Hemoglobin: 12.2 g/dL — ABNORMAL LOW (ref 13.2–17.1)
LYMPHS ABS: 7564 {cells}/uL — AB (ref 850–3900)
Lymphocytes Relative: 61 %
MCH: 27.7 pg (ref 27.0–33.0)
MCHC: 32.5 g/dL (ref 32.0–36.0)
MCV: 85.2 fL (ref 80.0–100.0)
MONO ABS: 868 {cells}/uL (ref 200–950)
MPV: 8.7 fL (ref 7.5–12.5)
Monocytes Relative: 7 %
NEUTROS ABS: 3596 {cells}/uL (ref 1500–7800)
Neutrophils Relative %: 29 %
Platelets: 332 10*3/uL (ref 140–400)
RBC: 4.4 MIL/uL (ref 4.20–5.80)
RDW: 14.6 % (ref 11.0–15.0)
WBC: 12.4 10*3/uL — ABNORMAL HIGH (ref 3.8–10.8)

## 2016-01-31 LAB — BASIC METABOLIC PANEL
BUN: 13 mg/dL (ref 7–25)
CO2: 25 mmol/L (ref 20–31)
Calcium: 8.5 mg/dL — ABNORMAL LOW (ref 8.6–10.3)
Chloride: 103 mmol/L (ref 98–110)
Creat: 0.9 mg/dL (ref 0.70–1.33)
GLUCOSE: 172 mg/dL — AB (ref 65–99)
POTASSIUM: 4 mmol/L (ref 3.5–5.3)
SODIUM: 136 mmol/L (ref 135–146)

## 2016-01-31 LAB — TSH: TSH: 1.31 m[IU]/L (ref 0.40–4.50)

## 2016-02-01 LAB — HEMOGLOBIN A1C
Hgb A1c MFr Bld: 8.9 % — ABNORMAL HIGH (ref <5.7)
MEAN PLASMA GLUCOSE: 209 mg/dL

## 2016-02-02 LAB — OTHER SOLSTAS TEST

## 2016-02-03 LAB — LYME AB/WESTERN BLOT REFLEX

## 2016-02-05 ENCOUNTER — Telehealth: Payer: Self-pay

## 2016-02-05 NOTE — Telephone Encounter (Signed)
Dr. Othella Boyer called and stated their lab tech threw away this pt. Blood before preforming his lyme test. Would you like the pt. Come back for a lab only visit?

## 2016-02-05 NOTE — Telephone Encounter (Signed)
We may not need that test. That could also be done at follow-up with infectious disease if needed. Please let him know that they were unable to perform the Lyme tests, but this may not be needed at this point. Can discuss this testing with infectious disease

## 2016-02-05 NOTE — Telephone Encounter (Signed)
Pt.notified

## 2016-02-07 ENCOUNTER — Ambulatory Visit (INDEPENDENT_AMBULATORY_CARE_PROVIDER_SITE_OTHER): Payer: BLUE CROSS/BLUE SHIELD | Admitting: Family Medicine

## 2016-02-07 VITALS — BP 138/72 | HR 94 | Temp 99.2°F | Resp 18 | Ht 70.0 in | Wt 265.6 lb

## 2016-02-07 DIAGNOSIS — D72829 Elevated white blood cell count, unspecified: Secondary | ICD-10-CM | POA: Diagnosis not present

## 2016-02-07 DIAGNOSIS — IMO0001 Reserved for inherently not codable concepts without codable children: Secondary | ICD-10-CM

## 2016-02-07 DIAGNOSIS — D649 Anemia, unspecified: Secondary | ICD-10-CM | POA: Diagnosis not present

## 2016-02-07 DIAGNOSIS — R634 Abnormal weight loss: Secondary | ICD-10-CM

## 2016-02-07 DIAGNOSIS — R600 Localized edema: Secondary | ICD-10-CM | POA: Diagnosis not present

## 2016-02-07 DIAGNOSIS — E1165 Type 2 diabetes mellitus with hyperglycemia: Secondary | ICD-10-CM

## 2016-02-07 DIAGNOSIS — B259 Cytomegaloviral disease, unspecified: Secondary | ICD-10-CM

## 2016-02-07 DIAGNOSIS — R509 Fever, unspecified: Secondary | ICD-10-CM

## 2016-02-07 DIAGNOSIS — B2790 Infectious mononucleosis, unspecified without complication: Secondary | ICD-10-CM

## 2016-02-07 LAB — CBC WITH DIFFERENTIAL/PLATELET
BASOS ABS: 99 {cells}/uL (ref 0–200)
BASOS PCT: 1 %
EOS ABS: 198 {cells}/uL (ref 15–500)
Eosinophils Relative: 2 %
HEMATOCRIT: 40.1 % (ref 38.5–50.0)
Hemoglobin: 13.6 g/dL (ref 13.2–17.1)
LYMPHS PCT: 52 %
Lymphs Abs: 5148 cells/uL — ABNORMAL HIGH (ref 850–3900)
MCH: 28.8 pg (ref 27.0–33.0)
MCHC: 33.9 g/dL (ref 32.0–36.0)
MCV: 84.8 fL (ref 80.0–100.0)
MONO ABS: 1089 {cells}/uL — AB (ref 200–950)
MPV: 8.1 fL (ref 7.5–12.5)
Monocytes Relative: 11 %
NEUTROS PCT: 34 %
Neutro Abs: 3366 cells/uL (ref 1500–7800)
Platelets: 403 10*3/uL — ABNORMAL HIGH (ref 140–400)
RBC: 4.73 MIL/uL (ref 4.20–5.80)
RDW: 14.5 % (ref 11.0–15.0)
WBC: 9.9 10*3/uL (ref 3.8–10.8)

## 2016-02-07 LAB — BASIC METABOLIC PANEL
BUN: 15 mg/dL (ref 7–25)
CHLORIDE: 102 mmol/L (ref 98–110)
CO2: 24 mmol/L (ref 20–31)
Calcium: 9.4 mg/dL (ref 8.6–10.3)
Creat: 0.94 mg/dL (ref 0.70–1.33)
GLUCOSE: 241 mg/dL — AB (ref 65–99)
POTASSIUM: 4.2 mmol/L (ref 3.5–5.3)
Sodium: 135 mmol/L (ref 135–146)

## 2016-02-07 NOTE — Progress Notes (Signed)
By signing my name below, I, Brandon Rich, attest that this documentation has been prepared under the direction and in the presence of Brandon Ray, MD.  Electronically Signed: Verlee Rich, Medical Scribe. 02/07/2016. 4:25 PM.  Subjective:    Patient ID: Brandon Rich Feeling, male    DOB: 1960-12-04, 55 y.o.   MRN: [id number]  HPI Chief Complaint  Patient presents with  . Follow-up    fever    HPI Comments: Brandon Rich is a 55 y.o. male who presents to the [facility] for fever follow-up. See previous visits regarding fevers and most recent dx of suspected CMV infection. Previous testing notable from recent EBV, IgM positive, suggesting possible mono. Overall, has been improving with low grade fevers, but has decreasing energy. Last visit was July 14th, at that time he was having some lower extremity edema he was advised to elevate legs, and if swelling didn't improved, advised to return for ultra sound. His CBC was improving last visit, TSH and BNP were overall nl, except elevated blood sugar.  Elevated Blood Sugar: He has had elevated reading with his most recent illness, but his previous reading close to 7 Lab Results  Component Value Date   HGBA1C 8.9* 01/30/2016   Pt states his swelling has been going down. Pt states he's no longer elevating it during the day and has some swelling at the end of the day- pt elevates his legs at night only. Pt states by the morning his legs would be normal. Pt states around 1:30 am, he has to get up to use the bathroom. Pt reports urination frequency. Pt states he's been losing weight. Pt states he physically feels his water loss in his abdomen. Pt no longer has leg cramping/pain. Pt had upper leg pain in the last visit with some cramping. Pt states he no longer gets tired while working, but crashes from fatigue when he gets home. Pt states he hasn't felt feverish in the week. Pt's blood sugar has been around 160 lately, with the  highest being 190, but normally ranging 120-150. Pt still takes Metformin, Invokana, and Victoza regularly. Pt denies difficulty urinating in the day, fevers, SOB, trouble breathing, chest pain, abdominal distention, and leg swelling.  Wt Readings from Last 3 Encounters:  02/07/16 265 lb 9.6 oz (120.475 kg)  01/30/16 284 lb (128.822 kg)  01/26/16 282 lb (127.914 kg)   Patient Active Problem List   Diagnosis Date Noted  . HTN (hypertension) 12/20/2011  . Diabetes mellitus ([place]) 12/20/2011  . Dyslipidemia 12/20/2011   Past Medical History  Diagnosis Date  . Hypertension   . Hyperlipidemia   . Diabetes mellitus   . Glaucoma   . Allergy    Past Surgical History  Procedure Laterality Date  . Hernia repair    . Spine surgery    . Tumor removal     No Known Allergies Prior to Admission medications   Medication Sig Start Date End Date Taking? Authorizing Provider  Ascorbic Acid (VITAMIN C) 1000 MG tablet Take 1,000 mg by mouth daily.    Historical Provider, MD  aspirin 81 MG tablet Take 81 mg by mouth daily.    Historical Provider, MD  atorvastatin (LIPITOR) 20 MG tablet Take 1 tablet (20 mg total) by mouth daily. 06/16/15   Orma Flaming, MD  brimonidine (ALPHAGAN) 0.2 % ophthalmic solution Place 1 drop into both eyes 2 (two) times daily.    Historical Provider, MD  calcium carbonate (TUMS -  DOSED IN MG ELEMENTAL CALCIUM) 500 MG chewable tablet Chew 2 tablets by mouth daily as needed for indigestion or heartburn.    Historical Provider, MD  canagliflozin (INVOKANA) 300 MG TABS tablet Take 300 mg by mouth daily before breakfast. 06/16/15   Orma Flaming, MD  fexofenadine (ALLEGRA) 180 MG tablet Take 180 mg by mouth daily.    Historical Provider, MD  fluticasone (FLONASE) 50 MCG/ACT nasal spray PLACE 2 SPRAYS INTO BOTH NOSTRILS DAILY. 07/09/14   Orma Flaming, MD  ibuprofen (ADVIL,MOTRIN) 600 MG tablet Take 600 mg by mouth every 6 (six) hours as needed.    Historical Provider, MD    Liraglutide (VICTOZA) 18 MG/3ML SOPN USE AS DIRECTED INJECTING1.8MG  SUBCUTANEOUSLY ONCEDAILY 01/30/16   [name], MD  lisinopril (PRINIVIL,ZESTRIL) 20 MG tablet Take 1 tablet (20 mg total) by mouth daily. 06/16/15   Orma Flaming, MD  metFORMIN (GLUCOPHAGE) 1000 MG tablet Take 1 tablet (1,000 mg total) by mouth 2 (two) times daily with a meal. 06/16/15   Orma Flaming, MD  Multiple Vitamin (MULTIVITAMIN) tablet Take 1 tablet by mouth every evening.     Historical Provider, MD  timolol (BETIMOL) 0.5 % ophthalmic solution Place 1 drop into both eyes at bedtime.     Historical Provider, MD   Social History   Social History  . Marital Status: Unknown    Spouse Name: N/A  . Number of Children: 2  . Years of Education: 16   Occupational History  . Tour manager    Social History Main Topics  . Smoking status: Never Smoker   . Smokeless tobacco: Not on file  . Alcohol Use: No     Comment: 1 drink  . Drug Use: No  . Sexual Activity:    Partners: Female   Other Topics Concern  . Not on file   Social History Narrative   Married. Education: [employer].    Review of Systems  Constitutional: Positive for unexpected weight change. Negative for fever and fatigue.  Respiratory: Negative for shortness of breath and wheezing.   Cardiovascular: Negative for chest pain and leg swelling.  Gastrointestinal: Negative for abdominal distention.  Genitourinary: Positive for frequency. Negative for difficulty urinating.   Objective:  BP 138/72 mmHg  Pulse 94  Temp(Src) 99.2 F (37.3 C) (Oral)  Resp 18  Ht 5\' 10"  (1.778 m)  Wt 265 lb 9.6 oz (120.475 kg)  BMI 38.11 kg/m2  SpO2 96%  Physical Exam  Constitutional: He is oriented to person, place, and time. He appears well-developed and well-nourished.  HENT:  Head: Normocephalic and atraumatic.  Eyes: EOM are normal. Pupils are equal, round, and reactive to light.  Neck: No JVD present. Carotid bruit is not present.   Cardiovascular: Normal rate, regular rhythm and normal heart sounds.  Exam reveals no gallop.   No murmur heard. Pulmonary/Chest: Effort normal and breath sounds normal. He has no wheezes. He has no rales.  Abdominal: Soft. He exhibits no distension. There is no tenderness.  Musculoskeletal: He exhibits no edema.  Neurological: He is alert and oriented to person, place, and time.  Skin: Skin is warm and dry.  Psychiatric: He has a normal mood and affect.  Vitals reviewed.  Assessment & Plan:   DEMORRIO THORNHILL is a 55 y.o. male Lower leg edema - Plan: Basic metabolic panel  Loss of weight - Plan: Basic metabolic panel  Fever, unspecified - Plan: CBC with Differential/Platelet  EBV infection  Cytomegalovirus infection, unspecified  cytomegaloviral infection type ([place])  Uncontrolled type 2 diabetes mellitus without complication, without long-term current use of insulin ([facility])  Leukocytosis  Anemia, unspecified anemia type   Follow-up from suspected CMV with secondary activation of EBV versus vice versa. Previously with lower extremity edema, now significant improved, as well as subjective abdominal distention. Significant weight loss with increased urine output recently, clinically much improved and fevers resolved.   -Prior anemia, borderline leukocytosis, will recheck CBC.  -With recent weight loss, decreased edema, check BMP to evaluate renal fxn and lytes.   -Diabetes uncontrolled, but prior to recent illness, had been doing well with A1c is around 7. As not improving, will hold on medication changes at this time. Follow home blood sugars, and if remaining elevated, returned discuss changes. Otherwise can recheck A1c in the next 3 months.  -Follow-up as planned with infectious disease in approximately 9 days. Can decide at that time whether a CMV titer may be helpful to determine if increasing that would suggest CMV as primary infection. Also can discuss with infectious disease  whether Lyme testing is needed, but doubt this is the case.  -RTC precautions discussed if any worsening of symptoms prior to infectious disease evaluation.  No orders of the defined types were placed in this encounter.  Patient Instructions    I will check your kidney function test again, blood count, and let you know if there are any changes needed based on those results. Keep follow-up with infectious disease in 9 days, return sooner if any worsening of symptoms. If the swelling returns in your lower legs significantly as discussed last time, return for recheck as you may need to have an echocardiogram to look at your heart function.  Return to the clinic or go to the nearest emergency room if any of your symptoms worsen or new symptoms occur.    IF you received an x-Rich today, you will receive an invoice from [facility]. Please contact [facility] at [phone] with questions or concerns regarding your invoice.   IF you received labwork today, you will receive an invoice from [employer]. Please contact [facility] at [phone] with questions or concerns regarding your invoice.   Our billing staff will not be able to assist you with questions regarding bills from these companies.  You will be contacted with the lab results as soon as they are available. The fastest way to get your results is to activate your My Chart account. Instructions are located on the last page of this paperwork. If you have not heard from [place] regarding the results in 2 weeks, please contact this office.       I personally performed the services described in this documentation, which was scribed in my presence. The recorded information has been reviewed and considered, and addended by me as needed.   Signed,   Brandon Ray, MD [facility].  02/08/16 8:56 PM

## 2016-02-07 NOTE — Patient Instructions (Addendum)
  I will check your kidney function test again, blood count, and let you know if there are any changes needed based on those results. Keep follow-up with infectious disease in 9 days, return sooner if any worsening of symptoms. If the swelling returns in your lower legs significantly as discussed last time, return for recheck as you may need to have an echocardiogram to look at your heart function.  Return to the clinic or go to the nearest emergency room if any of your symptoms worsen or new symptoms occur.    IF you received an x-ray today, you will receive an invoice from [facility]. Please contact [facility] at [phone] with questions or concerns regarding your invoice.   IF you received labwork today, you will receive an invoice from [employer]. Please contact [facility] at [phone] with questions or concerns regarding your invoice.   Our billing staff will not be able to assist you with questions regarding bills from these companies.  You will be contacted with the lab results as soon as they are available. The fastest way to get your results is to activate your My Chart account. Instructions are located on the last page of this paperwork. If you have not heard from [place] regarding the results in 2 weeks, please contact this office.

## 2016-02-16 ENCOUNTER — Ambulatory Visit (INDEPENDENT_AMBULATORY_CARE_PROVIDER_SITE_OTHER): Payer: BLUE CROSS/BLUE SHIELD | Admitting: Infectious Disease

## 2016-02-16 ENCOUNTER — Encounter: Payer: Self-pay | Admitting: Infectious Disease

## 2016-02-16 VITALS — BP 123/88 | HR 108 | Temp 97.7°F | Ht 69.0 in | Wt 261.0 lb

## 2016-02-16 DIAGNOSIS — R0609 Other forms of dyspnea: Secondary | ICD-10-CM | POA: Insufficient documentation

## 2016-02-16 DIAGNOSIS — R9431 Abnormal electrocardiogram [ECG] [EKG]: Secondary | ICD-10-CM | POA: Insufficient documentation

## 2016-02-16 DIAGNOSIS — I10 Essential (primary) hypertension: Secondary | ICD-10-CM

## 2016-02-16 DIAGNOSIS — I5041 Acute combined systolic (congestive) and diastolic (congestive) heart failure: Secondary | ICD-10-CM | POA: Diagnosis not present

## 2016-02-16 DIAGNOSIS — R635 Abnormal weight gain: Secondary | ICD-10-CM | POA: Diagnosis not present

## 2016-02-16 DIAGNOSIS — R634 Abnormal weight loss: Secondary | ICD-10-CM | POA: Insufficient documentation

## 2016-02-16 DIAGNOSIS — R509 Fever, unspecified: Secondary | ICD-10-CM | POA: Diagnosis not present

## 2016-02-16 DIAGNOSIS — R7401 Elevation of levels of liver transaminase levels: Secondary | ICD-10-CM

## 2016-02-16 DIAGNOSIS — R21 Rash and other nonspecific skin eruption: Secondary | ICD-10-CM

## 2016-02-16 DIAGNOSIS — R740 Nonspecific elevation of levels of transaminase and lactic acid dehydrogenase [LDH]: Secondary | ICD-10-CM

## 2016-02-16 HISTORY — DX: Acute combined systolic (congestive) and diastolic (congestive) heart failure: I50.41

## 2016-02-16 HISTORY — DX: Rash and other nonspecific skin eruption: R21

## 2016-02-16 HISTORY — DX: Fever, unspecified: R50.9

## 2016-02-16 HISTORY — DX: Elevation of levels of liver transaminase levels: R74.01

## 2016-02-16 NOTE — Progress Notes (Signed)
Reason for Consult: FUO  Requesting Physician: Dr. Carlota Raspberry  Subjective:    Patient ID: Brandon Rich, male    DOB: Dec 09, 1960, 55 y.o.   MRN: [id number]  HPI  55 year old Caucasian male with hx of valvular heart disease, mono, DM, HTN presented to Urgent Care in late June with c/o frontal HA, arthralgias, fatigue, and fever of 101. Pt's states he had pain in the back of his eye when he looks up or side to side. Pt had minimal sinus congestion for an hour the first day of symptoms. Pt reported to Urgent care (thoughh not to me today on recounting his story of)  urinary frequency, and urgency. Pt's sugars were running 150-190 when he wakes up, but goes down around lunch.  Pt feels better in the morning, but his fever gets worse at night. [place] without a specific tick bite or exposure. In ED at that time WBC normal. UA negative. He was given doxycyline in case he had succumbed to RMSF vs Ehrlichia but despite doxy he felt no better with fevers worsening, along with fatigue. IN the interim he had developed SOB, cough with no sputum production. He was seen in urgent care and sent to ED on July 6th.   CT abdomen showed gallstones but no thing else to point to source of fevers.   Other labs at that time showed WBC to 14.5K with 61% lymphs, atypical Lymphs. CMP had shown elevation of AST and ALT to 156/100. Alk phosphatase at 127, bilirubin of 0.8.  LDH was 437. ESR and CRP checked a few days later were 47 and 5.2 respectively.  RMSF and Ehrlichia ab negative. EBV VC IgM and IgG both +. CMV IgM was + >240, HIV -. Hepatitis panel negative.   He also had onset of 10# weight with fatigue and DOE and LE edema which then improved but also with now reversal of weight with 20# weight loss.  His fevers have now dissappeared and fatigue improved and he worked 42 hours last week.  Past Medical History:  Diagnosis Date  . 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 Allergy   . Diabetes mellitus   . FUO (fever of unknown origin) 02/16/2016  . Glaucoma   . Hyperlipidemia   . Hypertension   . Weight gain 02/16/2016    Past Surgical History:  Procedure Laterality Date  . HERNIA REPAIR    . SPINE SURGERY    . TUMOR REMOVAL      Family History  Problem Relation Age of Onset  . Skin cancer Mother   . Cancer Mother   . Heart disease Mother   . Hypertension Mother   . Cancer Father   . Heart disease Father   . Heart disease Maternal Grandmother   . Hyperlipidemia Maternal Grandmother   . Hypertension Maternal Grandmother   . Heart disease Maternal Grandfather   . Heart disease Paternal Grandfather   . Hypertension Paternal Grandfather   . Hyperlipidemia Paternal Grandfather       Social History   Social History  . Marital status: Unknown    Spouse name: N/A  . Number of children: 2  . Years of education: 16   Occupational History  . Tour manager    Social History Main Topics  . Smoking status: Never Smoker  . Smokeless tobacco: Never Used  . Alcohol use No  Comment: 1 drink  . Drug use: No  . Sexual activity: Yes    Partners: Female   Other Topics Concern  . None   Social History Narrative   Married. Education: [employer].     No Known Allergies   Current Outpatient Prescriptions:  .  Ascorbic Acid (VITAMIN C) 1000 MG tablet, Take 1,000 mg by mouth daily., Disp: , Rfl:  .  aspirin 81 MG tablet, Take 81 mg by mouth daily., Disp: , Rfl:  .  atorvastatin (LIPITOR) 20 MG tablet, Take 1 tablet (20 mg total) by mouth daily., Disp: 90 tablet, Rfl: 3 .  brimonidine (ALPHAGAN) 0.2 % ophthalmic solution, Place 1 drop into both eyes 2 (two) times daily., Disp: , Rfl:  .  calcium carbonate (TUMS - DOSED IN MG ELEMENTAL CALCIUM) 500 MG chewable tablet, Chew 2 tablets by mouth daily as needed for indigestion or heartburn., Disp: , Rfl:  .  canagliflozin (INVOKANA)  300 MG TABS tablet, Take 300 mg by mouth daily before breakfast., Disp: 30 tablet, Rfl: 11 .  fexofenadine (ALLEGRA) 180 MG tablet, Take 180 mg by mouth daily., Disp: , Rfl:  .  fluticasone (FLONASE) 50 MCG/ACT nasal spray, PLACE 2 SPRAYS INTO BOTH NOSTRILS DAILY., Disp: 16 g, Rfl: 10 .  ibuprofen (ADVIL,MOTRIN) 600 MG tablet, Take 600 mg by mouth every 6 (six) hours as needed., Disp: , Rfl:  .  Liraglutide (VICTOZA) 18 MG/3ML SOPN, USE AS DIRECTED INJECTING1.8MG SUBCUTANEOUSLY ONCEDAILY, Disp: 3 mL, Rfl: 2 .  lisinopril (PRINIVIL,ZESTRIL) 20 MG tablet, Take 1 tablet (20 mg total) by mouth daily., Disp: 90 tablet, Rfl: 3 .  metFORMIN (GLUCOPHAGE) 1000 MG tablet, Take 1 tablet (1,000 mg total) by mouth 2 (two) times daily with a meal., Disp: 180 tablet, Rfl: 3 .  Multiple Vitamin (MULTIVITAMIN) tablet, Take 1 tablet by mouth every evening. , Disp: , Rfl:  .  timolol (BETIMOL) 0.5 % ophthalmic solution, Place 1 drop into both eyes at bedtime. , Disp: , Rfl:       Review of Systems  Constitutional: Positive for appetite change, fever and unexpected weight change. Negative for chills and diaphoresis.  HENT: Negative for congestion, hearing loss, sore throat and tinnitus.   Respiratory: Positive for cough and shortness of breath. Negative for wheezing.   Cardiovascular: Positive for leg swelling. Negative for chest pain and palpitations.  Gastrointestinal: Negative for abdominal pain, blood in stool, constipation, diarrhea, nausea and vomiting.  Genitourinary: Negative for dysuria, flank pain and hematuria.  Musculoskeletal: Negative for back pain and myalgias.  Skin: Positive for rash.  Neurological: Negative for dizziness, weakness and headaches.  Hematological: Does not bruise/bleed easily.  Psychiatric/Behavioral: Negative for suicidal ideas. The patient is not nervous/anxious.        Objective:   Physical Exam  Constitutional: He is oriented to person, place, and time. He appears  well-developed and well-nourished.  HENT:  Head: Normocephalic and atraumatic.  Eyes: Conjunctivae and EOM are normal. Pupils are equal, round, and reactive to light. Right eye exhibits no discharge. Left eye exhibits no discharge. No scleral icterus.  Neck: Normal range of motion. Neck supple. No JVD present. No thyromegaly present.  Cardiovascular: Normal rate, regular rhythm and normal heart sounds.  Exam reveals no gallop and no friction rub.   No murmur heard. Pulmonary/Chest: Effort normal and breath sounds normal. No respiratory distress. He has no wheezes. He has no rales.  Abdominal: Soft. Bowel sounds are normal. He exhibits no distension. There is no tenderness.  There is no rebound.  Musculoskeletal: Normal range of motion. He exhibits no edema or tenderness.  Lymphadenopathy:    He has no cervical adenopathy.  Neurological: He is alert and oriented to person, place, and time.  Skin: Skin is warm and dry. No rash noted. No erythema. No pallor.  Psychiatric: He has a normal mood and affect. His behavior is normal. Judgment and thought content normal.          Assessment & Plan:    FUO + pertinent clues being elevation in transaminases, LDH, leukocytosis with lymph predominance, + CMV IgM. He also had some non diffuse T wave inversions on EKG though not clear cut pericarditis. His sudden weight gain, DOE and LE edema would make one wonder about viral pericarditis, myocarditis and even infectious endocarditis  --we will check blood cultures in case he has been having bacterial endocarditis masked by doxy (his blood cultures were negative in ED)  --check Echocardiogram  We will recheck his CMV IgM and IgG though not sure this will tell us much at this point  Recheck ESR, CRP, LDH, CBC c diff, CMP  I will check Coxsackie virus antibodies in case this was as viral pericarditis myocarditis due to this virus also in light of rash on his legs  Since his fevers are resolving as  are his symptoms of fatigue I would NOT resort to full FUO workup at this time unless his labs today are disconcerting or his clinical course changes   I spent greater than 55 minutes with the patient including greater than 50% of time in face to face counsel of the patient re his FUO, his elevated LFT,s weight change, EKG changes, DOE, weight loss, and in coordination of his care.

## 2016-02-17 LAB — COMPLETE METABOLIC PANEL WITH GFR
ALBUMIN: 4.7 g/dL (ref 3.6–5.1)
ALK PHOS: 68 U/L (ref 40–115)
ALT: 26 U/L (ref 9–46)
AST: 51 U/L — ABNORMAL HIGH (ref 10–35)
BILIRUBIN TOTAL: 0.6 mg/dL (ref 0.2–1.2)
BUN: 14 mg/dL (ref 7–25)
CO2: 20 mmol/L (ref 20–31)
CREATININE: 0.89 mg/dL (ref 0.70–1.33)
Calcium: 10.1 mg/dL (ref 8.6–10.3)
Chloride: 103 mmol/L (ref 98–110)
GFR, Est African American: >89 mL/min (ref >=60)
GFR, Est Non African American: >89 mL/min (ref >=60)
GLUCOSE: 165 mg/dL — AB (ref 65–99)
Potassium: 4.1 mmol/L (ref 3.5–5.3)
SODIUM: 135 mmol/L (ref 135–146)
TOTAL PROTEIN: 7.9 g/dL (ref 6.1–8.1)

## 2016-02-17 LAB — CBC WITH DIFFERENTIAL/PLATELET
BASOS ABS: 108 {cells}/uL (ref 0–200)
BASOS PCT: 1 %
EOS ABS: 324 {cells}/uL (ref 15–500)
EOS PCT: 3 %
HCT: 43.3 % (ref 38.5–50.0)
HEMOGLOBIN: 14.5 g/dL (ref 13.2–17.1)
LYMPHS ABS: 4104 {cells}/uL — AB (ref 850–3900)
Lymphocytes Relative: 38 %
MCH: 28.2 pg (ref 27.0–33.0)
MCHC: 33.5 g/dL (ref 32.0–36.0)
MCV: 84.1 fL (ref 80.0–100.0)
MONOS PCT: 7 %
MPV: 8.8 fL (ref 7.5–12.5)
Monocytes Absolute: 756 cells/uL (ref 200–950)
NEUTROS ABS: 5508 {cells}/uL (ref 1500–7800)
Neutrophils Relative %: 51 %
PLATELETS: 369 10*3/uL (ref 140–400)
RBC: 5.15 MIL/uL (ref 4.20–5.80)
RDW: 14.6 % (ref 11.0–15.0)
WBC: 10.8 10*3/uL (ref 3.8–10.8)

## 2016-02-17 LAB — LACTATE DEHYDROGENASE: LDH: 205 U/L (ref 94–250)

## 2016-02-17 LAB — CYTOMEGALOVIRUS ANTIBODY, IGG: Cytomegalovirus Ab-IgG: 9.6 U/mL — ABNORMAL HIGH (ref <0.60)

## 2016-02-17 LAB — SEDIMENTATION RATE: Sed Rate: 19 mm/hr (ref 0–20)

## 2016-02-17 LAB — C-REACTIVE PROTEIN

## 2016-02-17 LAB — CMV IGM: CMV IGM: 162 [AU]/ml — AB (ref <30.00)

## 2016-02-19 LAB — COXSACKIE B VIRUS ANTIBODIES
Coxsackie B1 Ab: 1:8 {titer} — ABNORMAL HIGH
Coxsackie B2 Ab: <1:8 {titer}
Coxsackie B3 Ab: <1:8 {titer}
Coxsackie B4 Ab: <1:8 {titer}
Coxsackie B5 Ab: <1:8 {titer}

## 2016-02-20 ENCOUNTER — Inpatient Hospital Stay: Admission: RE | Admit: 2016-02-20 | Payer: BLUE CROSS/BLUE SHIELD | Source: Ambulatory Visit

## 2016-02-20 LAB — COXSACKIE A VIRUS ANTIBODIES
Coxsackie A10 Ab: <1:8 {titer}
Coxsackie A16 Ab: <1:8 {titer}
Coxsackie A4 Ab: <1:8 {titer}
Coxsackie A7 Ab: <1:8 {titer}
Coxsackie A9 Ab: <1:8 {titer}

## 2016-02-23 LAB — CULTURE, BLOOD (SINGLE)
ORGANISM ID, BACTERIA: NO GROWTH
ORGANISM ID, BACTERIA: NO GROWTH

## 2016-02-26 ENCOUNTER — Telehealth: Payer: Self-pay | Admitting: *Deleted

## 2016-02-26 NOTE — Telephone Encounter (Signed)
Are you talking about the echo?

## 2016-02-26 NOTE — Telephone Encounter (Signed)
Patient called to follow up on the EKG ordered by Dr. Tommy Medal at his appointment last week.  Per chart review, this order was sent to be completed at [place] contacted the [facility] office, was offered a number of appointment times.  RN requested that they contact the patient directly to schedule this so that he would make it.  RN contacted patient 8/10 to see if he had successfully scheduled an EKG - no answer, voicemail full and unable to leave message. Landis Gandy, RN

## 2016-02-27 ENCOUNTER — Other Ambulatory Visit: Payer: Self-pay | Admitting: Physician Assistant

## 2016-02-27 NOTE — Telephone Encounter (Signed)
Yes?  He said it was for an EKG that was mentioned at the visit.

## 2016-02-27 NOTE — Telephone Encounter (Signed)
Oh I believe I ordered an ECHOCardiogram

## 2016-03-01 ENCOUNTER — Ambulatory Visit
Admission: RE | Admit: 2016-03-01 | Discharge: 2016-03-01 | Disposition: A | Discharge status: Home / Self Care | Payer: BLUE CROSS/BLUE SHIELD | Source: Ambulatory Visit | Attending: Family Medicine | Admitting: Family Medicine

## 2016-03-01 DIAGNOSIS — Q639 Congenital malformation of kidney, unspecified: Secondary | ICD-10-CM

## 2016-03-01 MED ORDER — GADOBENATE DIMEGLUMINE 529 MG/ML IV SOLN
20.0000 mL | Freq: Once | INTRAVENOUS | Status: AC | PRN
  Administered 2016-03-01: 20 mL via INTRAVENOUS

## 2016-03-05 ENCOUNTER — Other Ambulatory Visit (HOSPITAL_COMMUNITY): Payer: BLUE CROSS/BLUE SHIELD

## 2016-03-16 ENCOUNTER — Encounter: Payer: Self-pay | Admitting: Family Medicine

## 2016-03-23 ENCOUNTER — Ambulatory Visit (INDEPENDENT_AMBULATORY_CARE_PROVIDER_SITE_OTHER): Payer: BLUE CROSS/BLUE SHIELD

## 2016-03-23 ENCOUNTER — Other Ambulatory Visit: Payer: Self-pay

## 2016-03-23 DIAGNOSIS — R509 Fever, unspecified: Secondary | ICD-10-CM

## 2016-03-27 ENCOUNTER — Ambulatory Visit (INDEPENDENT_AMBULATORY_CARE_PROVIDER_SITE_OTHER): Payer: BLUE CROSS/BLUE SHIELD

## 2016-03-27 DIAGNOSIS — Z23 Encounter for immunization: Secondary | ICD-10-CM | POA: Diagnosis not present

## 2016-03-29 ENCOUNTER — Encounter: Payer: Self-pay | Admitting: Infectious Disease

## 2016-03-29 ENCOUNTER — Ambulatory Visit (INDEPENDENT_AMBULATORY_CARE_PROVIDER_SITE_OTHER): Payer: BLUE CROSS/BLUE SHIELD | Admitting: Infectious Disease

## 2016-03-29 VITALS — BP 129/83 | HR 80 | Temp 98.6°F | Ht 69.0 in | Wt 258.0 lb

## 2016-03-29 DIAGNOSIS — R509 Fever, unspecified: Secondary | ICD-10-CM

## 2016-03-29 DIAGNOSIS — R9431 Abnormal electrocardiogram [ECG] [EKG]: Secondary | ICD-10-CM

## 2016-03-29 DIAGNOSIS — R740 Nonspecific elevation of levels of transaminase and lactic acid dehydrogenase [LDH]: Secondary | ICD-10-CM | POA: Diagnosis not present

## 2016-03-29 DIAGNOSIS — R634 Abnormal weight loss: Secondary | ICD-10-CM

## 2016-03-29 DIAGNOSIS — R635 Abnormal weight gain: Secondary | ICD-10-CM | POA: Diagnosis not present

## 2016-03-29 DIAGNOSIS — R7401 Elevation of levels of liver transaminase levels: Secondary | ICD-10-CM

## 2016-03-29 DIAGNOSIS — I517 Cardiomegaly: Secondary | ICD-10-CM | POA: Insufficient documentation

## 2016-03-29 HISTORY — DX: Cardiomegaly: I51.7

## 2016-03-29 NOTE — Progress Notes (Signed)
Chief complaint: followup for FUO, + fatigue  Subjective:    Patient ID: Brandon Rich, male    DOB: 1960-09-01, 55 y.o.   MRN: [id number]  HPI  55 year old Caucasian male with hx of valvular heart disease, mono, DM, HTN presented to Urgent Care in late June with c/o frontal HA, arthralgias, fatigue, and fever of 101. Pt's states he had pain in the back of his eye when he looks up or side to side. Pt had minimal sinus congestion for an hour the first day of symptoms. Pt reported to Urgent care (thoughh not to me today on recounting his story of)  urinary frequency, and urgency. Pt's sugars were running 150-190 when he wakes up, but goes down around lunch.  Pt feels better in the morning, but his fever gets worse at night. [place] without a specific tick bite or exposure. In ED at that time WBC normal. UA negative. He was given doxycyline in case he had succumbed to RMSF vs Ehrlichia but despite doxy he felt no better with fevers worsening, along with fatigue. IN the interim he had developed SOB, cough with no sputum production. He was seen in urgent care and sent to ED on July 6th.   CT abdomen showed gallstones but no thing else to point to source of fevers.   Other labs at that time showed WBC to 14.5K with 61% lymphs, atypical Lymphs. CMP had shown elevation of AST and ALT to 156/100. Alk phosphatase at 127, bilirubin of 0.8.  LDH was 437. ESR and CRP checked a few days later were 47 and 5.2 respectively.  RMSF and Ehrlichia ab negative. EBV VC IgM and IgG both +. CMV IgM was + >240, HIV -. Hepatitis panel negative.   He also had onset of 10# weight with fatigue and DOE and LE edema which then improved but also with now reversal of weight with 20# weight loss.  His fevers have now dissappeared and fatigue improved and he worked 42 hours week prior to my seeing him.  Since I last saw him we did additional labs but really only  lab of signficance remained the high CMV IgM (and also + IgG)  I got a TTE which showed dilated LV with some hypertrophy but otherwise normal.  He has lost all of the weight he gained and a little more but feels well and is only still a bit fatigued. No more fevers whatsoever.   Past Medical History:  Diagnosis Date  . Acute combined systolic and diastolic heart failure ([place]) 02/16/2016  . Allergy   . Diabetes mellitus   . DOE (dyspnea on exertion) 02/16/2016  . FUO (fever of unknown origin) 02/16/2016  . Glaucoma   . Hyperlipidemia   . Hypertension   . Rash and nonspecific skin eruption 02/16/2016  . T wave inversion in EKG 02/16/2016  . Transaminitis 02/16/2016  . Weight gain 02/16/2016  . Weight loss 02/16/2016    Past Surgical History:  Procedure Laterality Date  . HERNIA REPAIR    . SPINE SURGERY    . TUMOR REMOVAL      Family History  Problem Relation Age of Onset  . Skin cancer Mother   . Cancer Mother   . Heart disease Mother   . Hypertension Mother   . Cancer Father   . Heart disease Father   . Heart disease Maternal Grandmother   . Hyperlipidemia Maternal Grandmother   .  Hypertension Maternal Grandmother   . Heart disease Maternal Grandfather   . Heart disease Paternal Grandfather   . Hypertension Paternal Grandfather   . Hyperlipidemia Paternal Grandfather       Social History   Social History  . Marital status: Unknown    Spouse name: N/A  . Number of children: 2  . Years of education: 16   Occupational History  . Tour manager    Social History Main Topics  . Smoking status: Never Smoker  . Smokeless tobacco: Never Used  . Alcohol use No     Comment: 1 drink  . Drug use: No  . Sexual activity: Yes    Partners: Female   Other Topics Concern  . None   Social History Narrative   Married. Education: [employer].     No Known Allergies   Current Outpatient Prescriptions:  .  Ascorbic Acid (VITAMIN C) 1000 MG tablet, Take 1,000 mg by  mouth daily., Disp: , Rfl:  .  aspirin 81 MG tablet, Take 81 mg by mouth daily., Disp: , Rfl:  .  atorvastatin (LIPITOR) 20 MG tablet, Take 1 tablet (20 mg total) by mouth daily., Disp: 90 tablet, Rfl: 3 .  brimonidine (ALPHAGAN) 0.2 % ophthalmic solution, Place 1 drop into both eyes 2 (two) times daily., Disp: , Rfl:  .  calcium carbonate (TUMS - DOSED IN MG ELEMENTAL CALCIUM) 500 MG chewable tablet, Chew 2 tablets by mouth daily as needed for indigestion or heartburn., Disp: , Rfl:  .  canagliflozin (INVOKANA) 300 MG TABS tablet, Take 300 mg by mouth daily before breakfast., Disp: 30 tablet, Rfl: 11 .  fexofenadine (ALLEGRA) 180 MG tablet, Take 180 mg by mouth daily., Disp: , Rfl:  .  fluticasone (FLONASE) 50 MCG/ACT nasal spray, PLACE 2 SPRAYS INTO BOTH NOSTRILS DAILY., Disp: 16 g, Rfl: 10 .  lisinopril (PRINIVIL,ZESTRIL) 20 MG tablet, Take 1 tablet (20 mg total) by mouth daily., Disp: 90 tablet, Rfl: 3 .  metFORMIN (GLUCOPHAGE) 1000 MG tablet, Take 1 tablet (1,000 mg total) by mouth 2 (two) times daily with a meal., Disp: 180 tablet, Rfl: 3 .  Multiple Vitamin (MULTIVITAMIN) tablet, Take 1 tablet by mouth every evening. , Disp: , Rfl:  .  timolol (BETIMOL) 0.5 % ophthalmic solution, Place 1 drop into both eyes at bedtime. , Disp: , Rfl:  .  VICTOZA 18 MG/3ML SOPN, USE AS DIRECTED INJECT 1.8MG SUBCUTANEOUSLY ONCEDAILY, Disp: 3 mL, Rfl: 0 .  ibuprofen (ADVIL,MOTRIN) 600 MG tablet, Take 600 mg by mouth every 6 (six) hours as needed., Disp: , Rfl:       Review of Systems  Constitutional: Positive for fatigue and unexpected weight change. Negative for appetite change, chills, diaphoresis and fever.  HENT: Negative for congestion, hearing loss, sore throat and tinnitus.   Respiratory: Negative for cough, shortness of breath and wheezing.   Cardiovascular: Negative for chest pain, palpitations and leg swelling.  Gastrointestinal: Negative for abdominal pain, blood in stool, constipation,  diarrhea, nausea and vomiting.  Genitourinary: Negative for dysuria, flank pain and hematuria.  Musculoskeletal: Negative for back pain and myalgias.  Skin: Positive for rash.  Neurological: Negative for dizziness, weakness and headaches.  Hematological: Does not bruise/bleed easily.  Psychiatric/Behavioral: Negative for suicidal ideas. The patient is not nervous/anxious.        Objective:   Physical Exam  Constitutional: He is oriented to person, place, and time. He appears well-developed and well-nourished.  HENT:  Head: Normocephalic and atraumatic.  Eyes:  Conjunctivae and EOM are normal. Pupils are equal, round, and reactive to light. Right eye exhibits no discharge. Left eye exhibits no discharge. No scleral icterus.  Neck: Normal range of motion. Neck supple. No JVD present. No thyromegaly present.  Cardiovascular: Normal rate, regular rhythm and normal heart sounds.  Exam reveals no gallop and no friction rub.   No murmur heard. Pulmonary/Chest: Effort normal and breath sounds normal. No respiratory distress. He has no wheezes. He has no rales.  Abdominal: Soft. Bowel sounds are normal. He exhibits no distension. There is no tenderness. There is no rebound.  Musculoskeletal: Normal range of motion. He exhibits no edema or tenderness.  Lymphadenopathy:    He has no cervical adenopathy.  Neurological: He is alert and oriented to person, place, and time.  Skin: Skin is warm and dry. No rash noted. No erythema. No pallor.  Psychiatric: He has a normal mood and affect. His behavior is normal. Judgment and thought content normal.          Assessment & Plan:    FUO with weight gain and loss with diuresis, transaminitis : I think most likely diagnosis was acute vs reactivated CMV. Regardless this febrile illness has resolved  LVH: ? Should he possibly be on a beta blocker. Will cc his primary care MD re this question

## 2016-05-20 ENCOUNTER — Other Ambulatory Visit: Payer: Self-pay | Admitting: Family Medicine

## 2016-05-20 NOTE — Telephone Encounter (Signed)
Last Ov and labs 01/2016

## 2016-05-21 NOTE — Telephone Encounter (Signed)
Refilled, but make sure he gets an appointment to see me within the next month if possible as he is due for repeat A1c and assessment of diabetes, especially as the numbers are running up some with his prior illness. Let me know if there are any questions.

## 2016-05-25 NOTE — Telephone Encounter (Signed)
Pt advised and transferred up front for appt.

## 2016-06-03 ENCOUNTER — Encounter: Payer: Self-pay | Admitting: Family Medicine

## 2016-06-03 ENCOUNTER — Ambulatory Visit (INDEPENDENT_AMBULATORY_CARE_PROVIDER_SITE_OTHER): Payer: BLUE CROSS/BLUE SHIELD | Admitting: Family Medicine

## 2016-06-03 VITALS — BP 112/78 | HR 85 | Temp 97.7°F | Resp 16 | Ht 68.25 in | Wt 265.4 lb

## 2016-06-03 DIAGNOSIS — E785 Hyperlipidemia, unspecified: Secondary | ICD-10-CM

## 2016-06-03 DIAGNOSIS — I10 Essential (primary) hypertension: Secondary | ICD-10-CM | POA: Diagnosis not present

## 2016-06-03 DIAGNOSIS — E119 Type 2 diabetes mellitus without complications: Secondary | ICD-10-CM

## 2016-06-03 LAB — COMPLETE METABOLIC PANEL WITH GFR
ALBUMIN: 4.6 g/dL (ref 3.6–5.1)
ALK PHOS: 60 U/L (ref 40–115)
ALT: 25 U/L (ref 9–46)
AST: 46 U/L — ABNORMAL HIGH (ref 10–35)
BILIRUBIN TOTAL: 0.6 mg/dL (ref 0.2–1.2)
BUN: 15 mg/dL (ref 7–25)
CALCIUM: 9.7 mg/dL (ref 8.6–10.3)
CO2: 27 mmol/L (ref 20–31)
Chloride: 101 mmol/L (ref 98–110)
Creat: 0.88 mg/dL (ref 0.70–1.33)
GFR, Est African American: >89 mL/min (ref >=60)
GLUCOSE: 148 mg/dL — AB (ref 65–99)
Potassium: 4.1 mmol/L (ref 3.5–5.3)
SODIUM: 136 mmol/L (ref 135–146)
TOTAL PROTEIN: 7.7 g/dL (ref 6.1–8.1)

## 2016-06-03 LAB — LIPID PANEL
CHOL/HDL RATIO: 3.8 ratio (ref <5.0)
Cholesterol: 107 mg/dL (ref <200)
HDL: 28 mg/dL — AB (ref >40)
LDL CALC: 44 mg/dL (ref <100)
TRIGLYCERIDES: 176 mg/dL — AB (ref <150)
VLDL: 35 mg/dL — AB (ref <30)

## 2016-06-03 MED ORDER — METFORMIN HCL 1000 MG PO TABS: 1000.0000 mg | ORAL_TABLET | Freq: Two times a day (BID) | ORAL | 3 refills | Status: DC

## 2016-06-03 MED ORDER — LISINOPRIL 20 MG PO TABS: 20.0000 mg | ORAL_TABLET | Freq: Every day | ORAL | 3 refills | Status: DC

## 2016-06-03 MED ORDER — LIRAGLUTIDE 18 MG/3ML ~~LOC~~ SOPN: PEN_INJECTOR | SUBCUTANEOUS | 5 refills | Status: DC

## 2016-06-03 MED ORDER — CANAGLIFLOZIN 300 MG PO TABS: 300.0000 mg | ORAL_TABLET | Freq: Every day | ORAL | 5 refills | Status: DC

## 2016-06-03 MED ORDER — ATORVASTATIN CALCIUM 20 MG PO TABS: 20.0000 mg | ORAL_TABLET | Freq: Every day | ORAL | 1 refills | Status: DC

## 2016-06-03 NOTE — Progress Notes (Signed)
By signing my name below, I, [name], attest that this documentation has been prepared under the direction and in the presence of [name], MD.  Electronically Signed: [name], Medical Scribe. 06/03/16. 5:43 PM.  Subjective:    Patient ID: Brandon Rich, male    DOB: [birth year], 55 y.o.   MRN: [id number]  HPI Chief Complaint  Patient presents with  . Follow-up    DIABETES  . Medication Refill    SEE MEDICATION LIST - some are for 90 day and 30 day    HPI Comments: Brandon Rich is a 55 y.o. male who presents to the [facility] for DM follow-up, and other medical problems. He was last seen by me in July for fever of unknown origin, did have elevated LFTs that down trended, see CMV. He was seen in follow-up by infectious disease Sept 11th, thought to be acute vs reactive CMV but afebrile and resolved at that time.Reports he's back to his base line and no longer feels ill.  DM: Takes invokana 300mg  QD, metformin 1000mg  BID, victoza 1.8mg  QD. Seen by his ophthalmologist biannually and no diabetic changes found. Seen by his dentist biannuallly and no diabetic changes were found. Compliant with his medications. Denies experiencing negative side effects from his medications such as nausea, vomiting, yeast infections, or other symptoms. Denies experiencing diabetic neuropathy. Lab Results  Component Value Date   HGBA1C 8.9 (H) 01/30/2016   Lab Results  Component Value Date   MICROALBUR 4.5 06/16/2015   Wt Readings from Last 3 Encounters:  06/03/16 265 lb 6.4 oz (120.4 kg)  03/29/16 258 lb (117 kg)  02/16/16 261 lb (118.4 kg)   HTN: Takes lisinopril 20mg . Infectious disease sent him for an echocardiogram 03/23/16; mild LDH, EF 55-60%, no wall motion abnormalities, no pericardial effusion. Pt does not check his bp often, but it's typically 120/80 when he checks it. Lab Results  Component Value Date   CREATININE 0.89 02/16/2016   BP Readings  from Last 3 Encounters:  06/03/16 112/78  03/29/16 129/83  02/16/16 123/88   HLD: Takes lipitor 20mg  QD. Compliant with his medication, no new side effects Lab Results  Component Value Date   CHOL 101 (L) 06/16/2015   HDL 30 (L) 06/16/2015   LDLCALC 39 06/16/2015   TRIG 160 (H) 06/16/2015   CHOLHDL 3.4 06/16/2015   Lab Results  Component Value Date   ALT 26 02/16/2016   AST 51 (H) 02/16/2016   ALKPHOS 68 02/16/2016   BILITOT 0.6 02/16/2016    Patient Active Problem List   Diagnosis Date Noted  . LVH (left ventricular hypertrophy) 03/29/2016  . FUO (fever of unknown origin) 02/16/2016  . Acute combined systolic and diastolic heart failure ([place]) 02/16/2016  . Weight gain 02/16/2016  . Weight loss 02/16/2016  . Rash and nonspecific skin eruption 02/16/2016  . DOE (dyspnea on exertion) 02/16/2016  . T wave inversion in EKG 02/16/2016  . Transaminitis 02/16/2016  . HTN (hypertension) 12/20/2011  . Diabetes mellitus ([place]) 12/20/2011  . Dyslipidemia 12/20/2011   Past Medical History:  Diagnosis Date  . Acute combined systolic and diastolic heart failure ([place]) 02/16/2016  . Allergy   . Diabetes mellitus   . DOE (dyspnea on exertion) 02/16/2016  . FUO (fever of unknown origin) 02/16/2016  . Glaucoma   . Hyperlipidemia   . Hypertension   . LVH (left ventricular hypertrophy) 03/29/2016  . Rash and nonspecific skin eruption 02/16/2016  . T  wave inversion in EKG 02/16/2016  . Transaminitis 02/16/2016  . Weight gain 02/16/2016  . Weight loss 02/16/2016   Past Surgical History:  Procedure Laterality Date  . HERNIA REPAIR    . SPINE SURGERY    . TUMOR REMOVAL     No Known Allergies Prior to Admission medications   Medication Sig Start Date End Date Taking? Authorizing Provider  Ascorbic Acid (VITAMIN C) 1000 MG tablet Take 1,000 mg by mouth daily.    Historical Provider, MD  aspirin 81 MG tablet Take 81 mg by mouth daily.    Historical Provider, MD  atorvastatin (LIPITOR) 20  MG tablet Take 1 tablet (20 mg total) by mouth daily. 06/16/15   Orma Flaming, MD  brimonidine (ALPHAGAN) 0.2 % ophthalmic solution Place 1 drop into both eyes 2 (two) times daily.    Historical Provider, MD  calcium carbonate (TUMS - DOSED IN MG ELEMENTAL CALCIUM) 500 MG chewable tablet Chew 2 tablets by mouth daily as needed for indigestion or heartburn.    Historical Provider, MD  canagliflozin (INVOKANA) 300 MG TABS tablet Take 300 mg by mouth daily before breakfast. 06/16/15   Orma Flaming, MD  fexofenadine (ALLEGRA) 180 MG tablet Take 180 mg by mouth daily.    Historical Provider, MD  fluticasone (FLONASE) 50 MCG/ACT nasal spray PLACE 2 SPRAYS INTO BOTH NOSTRILS DAILY. 07/09/14   Orma Flaming, MD  ibuprofen (ADVIL,MOTRIN) 600 MG tablet Take 600 mg by mouth every 6 (six) hours as needed.    Historical Provider, MD  lisinopril (PRINIVIL,ZESTRIL) 20 MG tablet Take 1 tablet (20 mg total) by mouth daily. 06/16/15   Orma Flaming, MD  metFORMIN (GLUCOPHAGE) 1000 MG tablet Take 1 tablet (1,000 mg total) by mouth 2 (two) times daily with a meal. 06/16/15   Orma Flaming, MD  Multiple Vitamin (MULTIVITAMIN) tablet Take 1 tablet by mouth every evening.     Historical Provider, MD  timolol (BETIMOL) 0.5 % ophthalmic solution Place 1 drop into both eyes at bedtime.     Historical Provider, MD  VICTOZA 18 MG/3ML SOPN USE AS DIRECTED INJECTING1.8MG  SUBCUTANEOUSLY ONCEDAILY 05/21/16   [name], MD   Social History   Social History  . Marital status: Unknown    Spouse name: N/A  . Number of children: 2  . Years of education: 16   Occupational History  . Tour manager    Social History Main Topics  . Smoking status: Never Smoker  . Smokeless tobacco: Never Used  . Alcohol use No     Comment: 1 drink  . Drug use: No  . Sexual activity: Yes    Partners: Female   Other Topics Concern  . Not on file   Social History Narrative   Married. Education: [employer].    Review of  Systems  Gastrointestinal: Negative for nausea and vomiting.  Neurological: Negative for numbness.   Objective:  Physical Exam  Constitutional: He appears well-developed and well-nourished. No distress.  HENT:  Head: Normocephalic and atraumatic.  Eyes: Conjunctivae are normal.  Neck: Neck supple.  Cardiovascular: Normal rate, regular rhythm and normal heart sounds.  Exam reveals no gallop and no friction rub.   No murmur heard. Pulmonary/Chest: Effort normal and breath sounds normal. No respiratory distress. He has no wheezes. He has no rales.  Abdominal: Soft. There is no tenderness. There is no guarding.  Musculoskeletal: He exhibits no edema.  Lymphadenopathy:    He has no cervical adenopathy.  Neurological:  He is alert.  Skin: Skin is warm and dry.  Psychiatric: He has a normal mood and affect. His behavior is normal.  Nursing note and vitals reviewed.  BP 112/78 (BP Location: Left Arm, Patient Position: Sitting, Cuff Size: Large)   Pulse 85   Temp 97.7 F (36.5 C) (Oral)   Resp 16   Ht 5' 8.25" (1.734 m)   Wt 265 lb 6.4 oz (120.4 kg)   SpO2 98%   BMI 40.06 kg/m  Assessment & Plan:   Brandon Rich is a 55 y.o. male Type 2 diabetes mellitus without complication, without long-term current use of insulin ([place]) - Plan: COMPLETE METABOLIC PANEL WITH GFR, Hemoglobin A1C, canagliflozin (INVOKANA) 300 MG TABS tablet, metFORMIN (GLUCOPHAGE) 1000 MG tablet, liraglutide (VICTOZA) 18 MG/3ML SOPN, Microalbumin, urine  - Continue same regimen for now, check A1c. Diet, exercise.  Hyperlipidemia, unspecified hyperlipidemia type - Plan: Lipid panel, atorvastatin (LIPITOR) 20 MG tablet, metFORMIN (GLUCOPHAGE) 1000 MG tablet  -Tolerating statin at current dose, continue same dose and check labs as above.   Essential hypertension - Plan: COMPLETE METABOLIC PANEL WITH GFR, atorvastatin (LIPITOR) 20 MG tablet, lisinopril (PRINIVIL,ZESTRIL) 20 MG tablet, metFORMIN (GLUCOPHAGE) 1000 MG  tablet  - stable. No med changes at present.   Health maintenance, appears to be due for colonoscopy, advised to call gastroenterologist as he likely can schedule that on his own. Advised to let me know if he needs referral.  Meds ordered this encounter  Medications  . atorvastatin (LIPITOR) 20 MG tablet    Sig: Take 1 tablet (20 mg total) by mouth daily.    Dispense:  90 tablet    Refill:  1  . canagliflozin (INVOKANA) 300 MG TABS tablet    Sig: Take 1 tablet (300 mg total) by mouth daily before breakfast.    Dispense:  30 tablet    Refill:  5  . lisinopril (PRINIVIL,ZESTRIL) 20 MG tablet    Sig: Take 1 tablet (20 mg total) by mouth daily.    Dispense:  90 tablet    Refill:  3  . metFORMIN (GLUCOPHAGE) 1000 MG tablet    Sig: Take 1 tablet (1,000 mg total) by mouth 2 (two) times daily with a meal.    Dispense:  180 tablet    Refill:  3  . liraglutide (VICTOZA) 18 MG/3ML SOPN    Sig: USE AS DIRECTED INJECTING1.8MG  SUBCUTANEOUSLY ONCEDAILY    Dispense:  3 pen    Refill:  5   Patient Instructions   No change in medications for now, I will check her A1c, kidney tests, cholesterol tests, and test for protein in the urine.  Call [place] to schedule screening colonoscopy. Let me know if you need a referral to do so. You may want to check with your insurance to see if this will be covered better this year or with a better plan earlynext year, but I suspect it will be covered this year.  Follow-up in 3 months as of right now unless blood sugar is very well controlled, then possibly 6 months. Let me know if you have any questions in the meantime.    IF you received an x-ray today, you will receive an invoice from [facility]. Please contact [facility] at [phone] with questions or concerns regarding your invoice.   IF you received labwork today, you will receive an invoice from [employer]. Please contact [facility] at [phone] with  questions or concerns regarding your invoice.   Our  billing staff will not be able to assist you with questions regarding bills from these companies.  You will be contacted with the lab results as soon as they are available. The fastest way to get your results is to activate your My Chart account. Instructions are located on the last page of this paperwork. If you have not heard from [place] regarding the results in 2 weeks, please contact this office.       I personally performed the services described in this documentation, which was scribed in my presence. The recorded information has been reviewed and considered, and addended by me as needed.   Signed,   [name], MD [facility].  06/04/16 10:53 PM

## 2016-06-03 NOTE — Patient Instructions (Addendum)
No change in medications for now, I will check her A1c, kidney tests, cholesterol tests, and test for protein in the urine.  Call [facility] to schedule screening colonoscopy. Let me know if you need a referral to do so. You may want to check with your insurance to see if this will be covered better this year or with a better plan earlynext year, but I suspect it will be covered this year.  Follow-up in 3 months as of right now unless blood sugar is very well controlled, then possibly 6 months. Let me know if you have any questions in the meantime.    IF you received an x-ray today, you will receive an invoice from [facility]. Please contact [facility] at [phone] with questions or concerns regarding your invoice.   IF you received labwork today, you will receive an invoice from [employer]. Please contact [facility] at [phone] with questions or concerns regarding your invoice.   Our billing staff will not be able to assist you with questions regarding bills from these companies.  You will be contacted with the lab results as soon as they are available. The fastest way to get your results is to activate your My Chart account. Instructions are located on the last page of this paperwork. If you have not heard from [place] regarding the results in 2 weeks, please contact this office.

## 2016-06-04 LAB — MICROALBUMIN, URINE: MICROALB UR: 1.3 mg/dL

## 2016-06-04 LAB — HEMOGLOBIN A1C
HEMOGLOBIN A1C: 8.5 % — AB (ref <5.7)
Mean Plasma Glucose: 197 mg/dL

## 2016-06-21 ENCOUNTER — Telehealth: Payer: Self-pay

## 2016-06-21 NOTE — Telephone Encounter (Signed)
PA needed for invokana. Completed form and faxed to ins. Pending.

## 2016-06-22 NOTE — Telephone Encounter (Signed)
Ins sent notice that no PA is needed for Invokana. Faxed notice to pharm.

## 2016-08-05 ENCOUNTER — Ambulatory Visit: Payer: BLUE CROSS/BLUE SHIELD | Admitting: Family Medicine

## 2016-08-12 ENCOUNTER — Ambulatory Visit (INDEPENDENT_AMBULATORY_CARE_PROVIDER_SITE_OTHER): Payer: Managed Care, Other (non HMO) | Admitting: Family Medicine

## 2016-08-12 ENCOUNTER — Encounter: Payer: Self-pay | Admitting: Family Medicine

## 2016-08-12 VITALS — BP 120/77 | HR 89 | Temp 98.9°F | Ht 68.25 in | Wt 269.4 lb

## 2016-08-12 DIAGNOSIS — Z5181 Encounter for therapeutic drug level monitoring: Secondary | ICD-10-CM | POA: Diagnosis not present

## 2016-08-12 DIAGNOSIS — E119 Type 2 diabetes mellitus without complications: Secondary | ICD-10-CM

## 2016-08-12 DIAGNOSIS — A09 Infectious gastroenteritis and colitis, unspecified: Secondary | ICD-10-CM | POA: Diagnosis not present

## 2016-08-12 MED ORDER — DULAGLUTIDE 1.5 MG/0.5ML ~~LOC~~ SOAJ: 1.5000 mg | SUBCUTANEOUS | 3 refills | Status: DC

## 2016-08-12 MED ORDER — CIPROFLOXACIN HCL 500 MG PO TABS: 500.0000 mg | ORAL_TABLET | Freq: Two times a day (BID) | ORAL | 1 refills | Status: DC

## 2016-08-12 MED ORDER — EMPAGLIFLOZIN 25 MG PO TABS: 25.0000 mg | ORAL_TABLET | Freq: Every day | ORAL | 3 refills | Status: DC

## 2016-08-12 NOTE — Progress Notes (Signed)
By signing my name below, I, [name], attest that this documentation has been prepared under the direction and in the presence of [name], MD.  Electronically Signed: [name], Medical Scribe. 08/12/16. 4:02 PM.  Subjective:    Patient ID: Brandon Rich, male    DOB: 1960/12/06, 56 y.o.   MRN: [id number]  HPI Chief Complaint  Patient presents with  . Follow-up    medication and A1C    HPI Comments: Brandon Rich is a 56 y.o. male who presents to the [facility] for DM follow-up. Last office visit was Nov 16th. At that time he was on invokana 300 mg QD, metformin 1000 mg BID, and victoza 1.8 mg QD. At that time he had regular follow-up with ophthalmologist and dentist. A1c was still significantly elevated at 8.5, decided to continue same regimen with strict adherence with diet and exercise.  Wt Readings from Last 3 Encounters:  08/12/16 269 lb 6.4 oz (122.2 kg)  06/03/16 265 lb 6.4 oz (120.4 kg)  03/29/16 258 lb (117 kg)   DM: Pt's daily blood sugar has been around 160 and in the past it's been around 180-190. Pt switched insurance from [place] to [place] and they will not pay for invokana or victoza; they will pay for trulicity, jardiance, and farxiga. Pt doesn't intentionally exercise weekly, but has been on the treadmill for 40 mins to reach 1 mi. Pt is eating breakfast daily. Pt avoids sugar in his diet, but he eats desserts in a small portions. He notices he can drink a lot of water when he adds some flavoring to it. Denies experiencing chest pain, SOB, abdominal pain or other negative side effects.  Lab Results  Component Value Date   CREATININE 0.88 06/03/2016   Traveler's Diarrhea: Pt travels half of the time he working and would like to get a refill of cipro. He would take cipro BID until his diarrhea resolved. He ran out 2 years ago.  Patient Active Problem List   Diagnosis Date Noted  . LVH (left ventricular hypertrophy) 03/29/2016   . FUO (fever of unknown origin) 02/16/2016  . Acute combined systolic and diastolic heart failure ([place]) 02/16/2016  . Weight gain 02/16/2016  . Weight loss 02/16/2016  . Rash and nonspecific skin eruption 02/16/2016  . DOE (dyspnea on exertion) 02/16/2016  . T wave inversion in EKG 02/16/2016  . Transaminitis 02/16/2016  . HTN (hypertension) 12/20/2011  . Diabetes mellitus ([place]) 12/20/2011  . Dyslipidemia 12/20/2011   Past Medical History:  Diagnosis Date  . Acute combined systolic and diastolic heart failure ([place]) 02/16/2016  . Allergy   . Diabetes mellitus   . DOE (dyspnea on exertion) 02/16/2016  . FUO (fever of unknown origin) 02/16/2016  . Glaucoma   . Hyperlipidemia   . Hypertension   . LVH (left ventricular hypertrophy) 03/29/2016  . Rash and nonspecific skin eruption 02/16/2016  . T wave inversion in EKG 02/16/2016  . Transaminitis 02/16/2016  . Weight gain 02/16/2016  . Weight loss 02/16/2016   Past Surgical History:  Procedure Laterality Date  . HERNIA REPAIR    . SPINE SURGERY    . TUMOR REMOVAL     No Known Allergies Prior to Admission medications   Medication Sig Start Date End Date Taking? Authorizing Provider  Ascorbic Acid (VITAMIN C) 1000 MG tablet Take 1,000 mg by mouth daily.   Yes Historical Provider, MD  aspirin 81 MG tablet Take 81 mg by mouth daily.  Yes Historical Provider, MD  atorvastatin (LIPITOR) 20 MG tablet Take 1 tablet (20 mg total) by mouth daily. 06/03/16  Yes [name], MD  brimonidine (ALPHAGAN) 0.2 % ophthalmic solution Place 1 drop into both eyes 2 (two) times daily.   Yes Historical Provider, MD  calcium carbonate (TUMS - DOSED IN MG ELEMENTAL CALCIUM) 500 MG chewable tablet Chew 2 tablets by mouth daily as needed for indigestion or heartburn.   Yes Historical Provider, MD  canagliflozin (INVOKANA) 300 MG TABS tablet Take 1 tablet (300 mg total) by mouth daily before breakfast. 06/03/16  Yes [name], MD  fexofenadine  (ALLEGRA) 180 MG tablet Take 180 mg by mouth daily.   Yes Historical Provider, MD  fluticasone (FLONASE) 50 MCG/ACT nasal spray PLACE 2 SPRAYS INTO BOTH NOSTRILS DAILY. 07/09/14  Yes Orma Flaming, MD  ibuprofen (ADVIL,MOTRIN) 600 MG tablet Take 600 mg by mouth every 6 (six) hours as needed.   Yes Historical Provider, MD  liraglutide (VICTOZA) 18 MG/3ML SOPN USE AS DIRECTED INJECTING1.8MG  SUBCUTANEOUSLY ONCEDAILY 06/03/16  Yes [name], MD  lisinopril (PRINIVIL,ZESTRIL) 20 MG tablet Take 1 tablet (20 mg total) by mouth daily. 06/03/16  Yes [name], MD  metFORMIN (GLUCOPHAGE) 1000 MG tablet Take 1 tablet (1,000 mg total) by mouth 2 (two) times daily with a meal. 06/03/16  Yes [name], MD  Multiple Vitamin (MULTIVITAMIN) tablet Take 1 tablet by mouth every evening.    Yes Historical Provider, MD  timolol (BETIMOL) 0.5 % ophthalmic solution Place 1 drop into both eyes at bedtime.    Yes Historical Provider, MD   Social History   Social History  . Marital status: Unknown    Spouse name: N/A  . Number of children: 2  . Years of education: 16   Occupational History  . Tour manager    Social History Main Topics  . Smoking status: Never Smoker  . Smokeless tobacco: Never Used  . Alcohol use No     Comment: 1 drink  . Drug use: No  . Sexual activity: Yes    Partners: Female   Other Topics Concern  . Not on file   Social History Narrative   Married. Education: [employer].    Review of Systems  Constitutional: Negative for fatigue and unexpected weight change.  Eyes: Negative for visual disturbance.  Respiratory: Negative for cough, chest tightness and shortness of breath.   Cardiovascular: Negative for chest pain, palpitations and leg swelling.  Gastrointestinal: Negative for abdominal pain and blood in stool.  Neurological: Negative for dizziness, light-headedness and headaches.   Objective:  Physical Exam  Constitutional: He is oriented to person,  place, and time. He appears well-developed and well-nourished. No distress.  HENT:  Head: Normocephalic and atraumatic.  Eyes: Conjunctivae and EOM are normal. Pupils are equal, round, and reactive to light.  Neck: Neck supple. No JVD present. Carotid bruit is not present.  Cardiovascular: Normal rate, regular rhythm and normal heart sounds.  Exam reveals no gallop and no friction rub.   No murmur heard. Pulmonary/Chest: Effort normal and breath sounds normal. He has no rales.  Musculoskeletal: He exhibits no edema (lower extermity).  Neurological: He is alert and oriented to person, place, and time.  Skin: Skin is warm and dry.  Psychiatric: He has a normal mood and affect. His behavior is normal.  Nursing note and vitals reviewed.  BP 120/77 (BP Location: Right Arm, Patient Position: Sitting, Cuff Size: Large)   Pulse  89   Temp 98.9 F (37.2 C) (Oral)   Ht 5' 8.25" (1.734 m)   Wt 269 lb 6.4 oz (122.2 kg)   SpO2 96%   BMI 40.66 kg/m  Assessment & Plan:   Brandon Rich is a 56 y.o. male Type 2 diabetes mellitus without complication, without long-term current use of insulin ([place]) - Plan: HM Diabetes Foot Exam, Hemoglobin A1C, empagliflozin (JARDIANCE) 25 MG TABS tablet, Dulaglutide (TRULICITY) 1.5 0000000 SOPN  - Continue same dose of metformin, meds changed to [place] and Trulicity due to insurance change. Anticipate similar efficacy. Plan on A1c in approximately 1 month with lab only visit, then follow-up in 3-4 months depending on those results. Diet/exercise discussed and possible need for insulin depending on the results of A1c next month.  Traveler's diarrhea - Plan: ciprofloxacin (CIPRO) 500 MG tablet  - Episodic with travel only, Cipro prescribed and appropriate use discussed.  Medication monitoring encounter - Plan: Basic metabolic panel   Meds ordered this encounter  Medications  . empagliflozin (JARDIANCE) 25 MG TABS tablet    Sig: Take 25 mg by mouth daily.     Dispense:  30 tablet    Refill:  3  . Dulaglutide (TRULICITY) 1.5 0000000 SOPN    Sig: Inject 1.5 mg into the skin once a week.    Dispense:  2 mL    Refill:  3  . ciprofloxacin (CIPRO) 500 MG tablet    Sig: Take 1 tablet (500 mg total) by mouth 2 (two) times daily. For up to 3 days at onset of symptoms.    Dispense:  12 tablet    Refill:  1   Patient Instructions   Increase exercise to 150 minutes minimum per week, and exercising most days per week. Continue to avoid sugar and desserts. Continue to drink water, as that is best.    Return for lab only visit by end of next month. If still elevated A1c at that time, would consider insulin.  I will check kidney function then as well as your medications are changing today.  cipro prescribed if needed for traveler's diarrhea.   IF you received an x-ray today, you will receive an invoice from [facility]. Please contact [facility] at [phone] with questions or concerns regarding your invoice.   IF you received labwork today, you will receive an invoice from [place]. Please contact [facility] at [phone] with questions or concerns regarding your invoice.   Our billing staff will not be able to assist you with questions regarding bills from these companies.  You will be contacted with the lab results as soon as they are available. The fastest way to get your results is to activate your My Chart account. Instructions are located on the last page of this paperwork. If you have not heard from [place] regarding the results in 2 weeks, please contact this office.      I personally performed the services described in this documentation, which was scribed in my presence. The recorded information has been reviewed and considered, and addended by me as needed.   Signed,   [name], MD [facility].  08/15/16 1:44 PM

## 2016-08-12 NOTE — Patient Instructions (Addendum)
Increase exercise to 150 minutes minimum per week, and exercising most days per week. Continue to avoid sugar and desserts. Continue to drink water, as that is best.    Return for lab only visit by end of next month. If still elevated A1c at that time, would consider insulin.  I will check kidney function then as well as your medications are changing today.  cipro prescribed if needed for traveler's diarrhea.   IF you received an x-ray today, you will receive an invoice from [facility]. Please contact [facility] at [phone] with questions or concerns regarding your invoice.   IF you received labwork today, you will receive an invoice from [place]. Please contact [facility] at [phone] with questions or concerns regarding your invoice.   Our billing staff will not be able to assist you with questions regarding bills from these companies.  You will be contacted with the lab results as soon as they are available. The fastest way to get your results is to activate your My Chart account. Instructions are located on the last page of this paperwork. If you have not heard from [place] regarding the results in 2 weeks, please contact this office.

## 2016-08-13 MED FILL — CIPROFLOXACIN HCL 500 MG TA: 500 | 6 days supply | Qty: 12 | Fill #0

## 2016-08-13 MED FILL — TRULICITY 1.5 MG/0.5 ML PEN: 1.5 | 28 days supply | Qty: 2 | Fill #0

## 2016-08-13 MED FILL — JARDIANCE 25 MG TABLET: 25 | 30 days supply | Qty: 30 | Fill #0

## 2016-09-10 MED FILL — JARDIANCE 25 MG TABLET: 25 | 30 days supply | Qty: 30 | Fill #1

## 2016-09-10 MED FILL — TRULICITY 1.5 MG/0.5 ML PEN: 1.5 | 28 days supply | Qty: 2 | Fill #1

## 2016-10-05 MED FILL — JARDIANCE 25 MG TABLET: 25 | 30 days supply | Qty: 30 | Fill #2

## 2016-10-05 MED FILL — TRULICITY 1.5 MG/0.5 ML PEN: 1.5 | 28 days supply | Qty: 2 | Fill #2

## 2016-11-04 MED FILL — TRULICITY 1.5 MG/0.5 ML PEN: 1.5 | 28 days supply | Qty: 2 | Fill #3

## 2016-11-04 MED FILL — JARDIANCE 25 MG TABLET: 25 | 30 days supply | Qty: 30 | Fill #3

## 2016-11-05 ENCOUNTER — Other Ambulatory Visit: Payer: Self-pay | Admitting: Family Medicine

## 2016-11-05 DIAGNOSIS — I10 Essential (primary) hypertension: Secondary | ICD-10-CM

## 2016-11-05 DIAGNOSIS — E785 Hyperlipidemia, unspecified: Secondary | ICD-10-CM

## 2016-11-26 ENCOUNTER — Encounter: Payer: Self-pay | Admitting: Family Medicine

## 2016-12-02 ENCOUNTER — Other Ambulatory Visit: Payer: Managed Care, Other (non HMO) | Admitting: Physician Assistant

## 2016-12-02 DIAGNOSIS — E119 Type 2 diabetes mellitus without complications: Secondary | ICD-10-CM

## 2016-12-02 DIAGNOSIS — Z5181 Encounter for therapeutic drug level monitoring: Secondary | ICD-10-CM

## 2016-12-02 LAB — BASIC METABOLIC PANEL
BUN/Creatinine Ratio: 17 (ref 9–20)
BUN: 15 mg/dL (ref 6–24)
CALCIUM: 9.7 mg/dL (ref 8.7–10.2)
CHLORIDE: 99 mmol/L (ref 96–106)
CO2: 19 mmol/L (ref 18–29)
Creatinine, Ser: 0.88 mg/dL (ref 0.76–1.27)
GFR calc Af Amer: 111 mL/min/{1.73_m2} (ref >59)
GFR calc non Af Amer: 96 mL/min/{1.73_m2} (ref >59)
GLUCOSE: 145 mg/dL — AB (ref 65–99)
Potassium: 4.3 mmol/L (ref 3.5–5.2)
Sodium: 137 mmol/L (ref 134–144)

## 2016-12-02 LAB — HEMOGLOBIN A1C
ESTIMATED AVERAGE GLUCOSE: 220 mg/dL
Hgb A1c MFr Bld: 9.3 % — ABNORMAL HIGH (ref 4.8–5.6)

## 2016-12-02 IMAGING — MR MR ABDOMEN WO/W CM
17 series · 48 of 48 positions shown · IV contrast (20ML MULTIHANCE)
Comparison: No prior abdominal MRI. CT the abdomen and pelvis
01/23/2016.

CLINICAL DATA: 55-year-old male with indeterminate lesion in the
upper pole of the right kidney noted on recent CT examination.
Followup study.

EXAM:
MRI ABDOMEN WITHOUT AND WITH CONTRAST
TECHNIQUE: Multiplanar multisequence MR imaging of the abdomen was performed
both before and after the administration of intravenous contrast.
CONTRAST:  20mL MULTIHANCE GADOBENATE DIMEGLUMINE 529 MG/ML IV SOLN

[Series 3: T2 · coronal · 5.0mm · 1.68mm/px · 1 of 40 slices shown (1 of 3)]
[im 1/40]
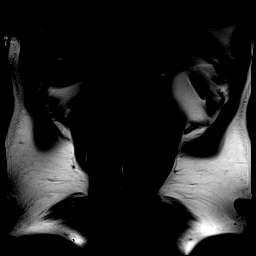

[Series 5: T2 · axial · 5.0mm · 1.68mm/px · 1 of 45 slices shown (2 of 3)]
[im 1/45]
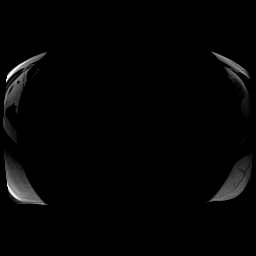

[Series 6: T1 · axial · 3.0mm · 1.34mm/px · z∈[-63,+150]mm · 6 of 144 slices shown]
[im 1/144]
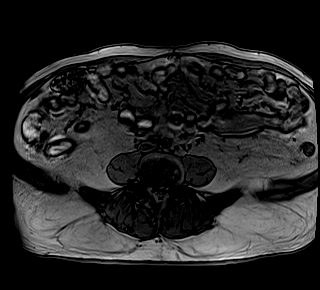
[im 29/144]
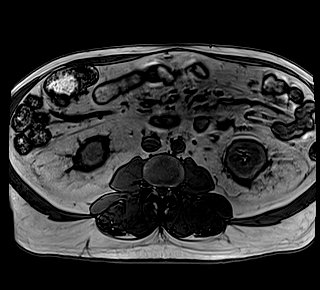
[im 58/144]
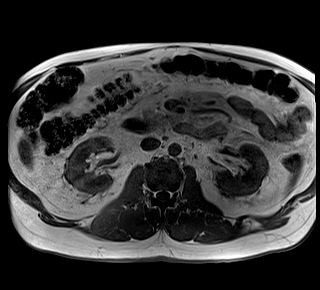
[im 86/144]
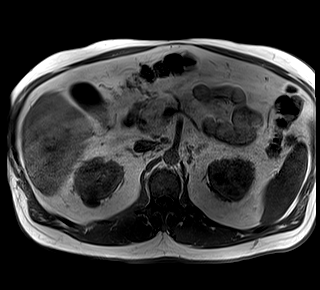
[im 115/144]
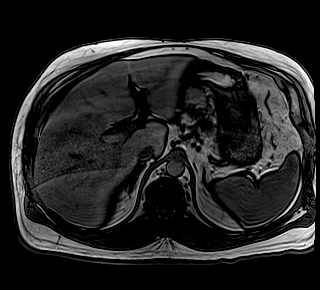
[im 144/144]
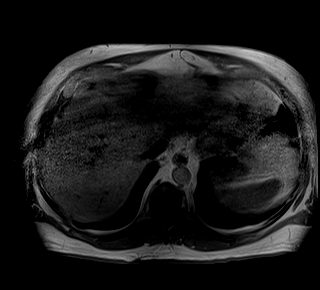

[Series 7: DWI · axial · 5.0mm · 1.60mm/px · z∈[-50,+172]mm · 5 of 114 slices shown (1 of 2)]
[im 1/114]
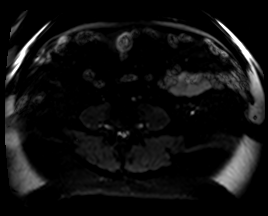
[im 29/114]
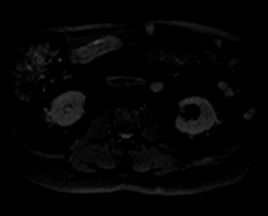
[im 57/114]
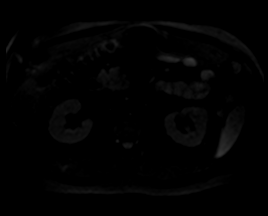
[im 85/114]
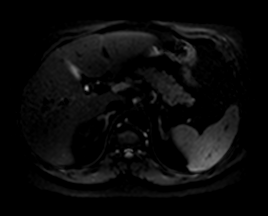
[im 114/114]
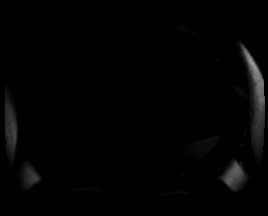

[Series 8: DWI · axial · 5.0mm · 1.60mm/px · z∈[-50,+172]mm · 2 of 38 slices shown (2 of 2)]
[im 1/38]
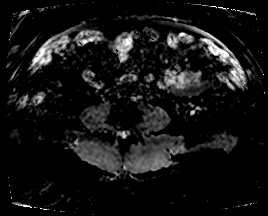
[im 38/38]
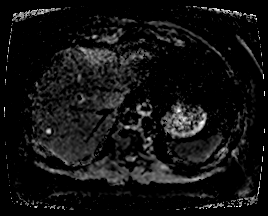

[Series 9: T2 · axial · 6.0mm · 1.34mm/px · 1 of 30 slices shown (3 of 3)]
[im 1/30]
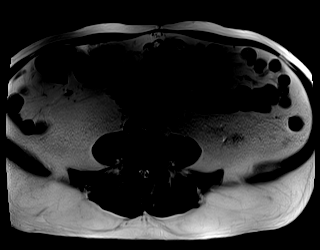

[Series 10: bSSFP · axial · 5.0mm · 1.34mm/px · z∈[-67,+155]mm · 2 of 38 slices shown]
[im 1/38]
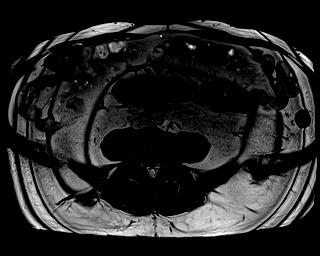
[im 38/38]
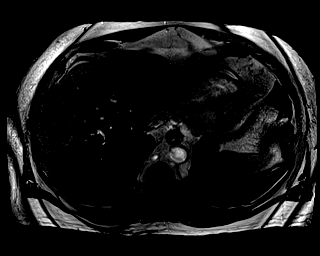

[Series 11: T1 dynamic · axial · non-contrast · 3.0mm · 1.34mm/px · z∈[-60,+153]mm · 3 of 72 slices shown]
[im 1/72]
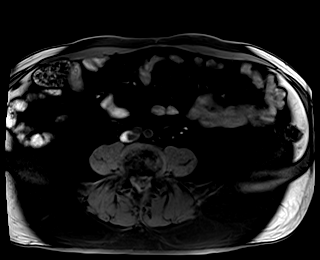
[im 36/72]
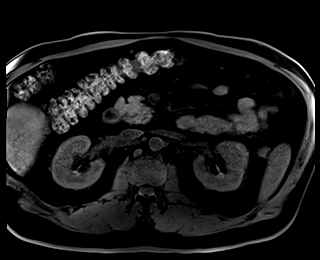
[im 72/72]
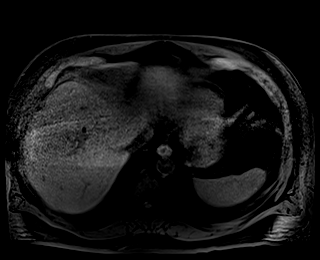

[Series 12: T1 dynamic post-contrast · axial · 3.0mm · 1.34mm/px · z∈[-60,+153]mm · 3 of 72 slices shown (1 of 9)]
[im 1/72]
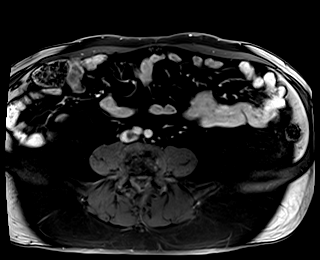
[im 36/72]
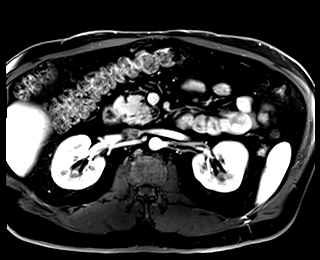
[im 72/72]
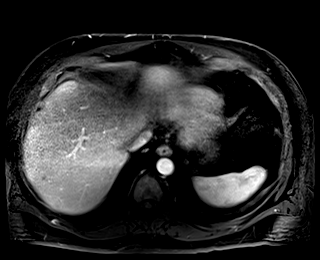

[Series 13: T1 dynamic post-contrast · axial · 3.0mm · 1.34mm/px · z∈[-60,+153]mm · 3 of 72 slices shown (2 of 9)]
[im 1/72]
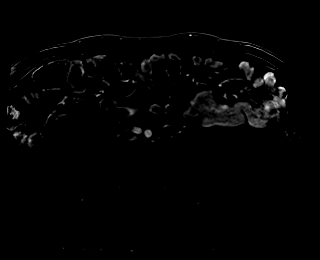
[im 36/72]
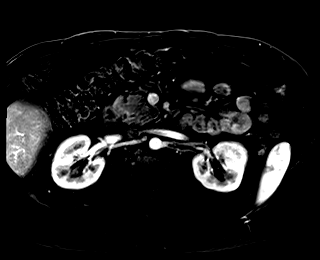
[im 72/72]
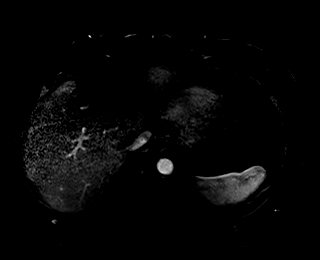

[Series 14: T1 dynamic post-contrast · axial · 3.0mm · 1.34mm/px · z∈[-60,+153]mm · 3 of 72 slices shown (3 of 9)]
[im 1/72]
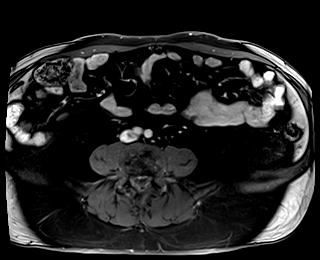
[im 36/72]
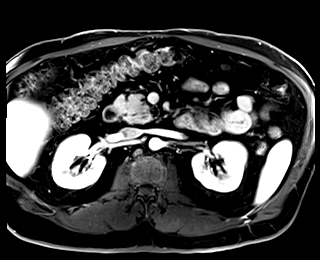
[im 72/72]
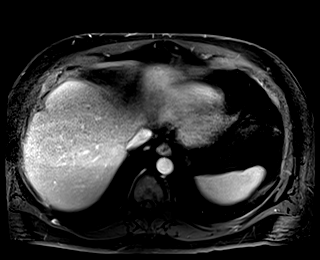

[Series 15: T1 dynamic post-contrast · axial · 3.0mm · 1.34mm/px · z∈[-60,+153]mm · 3 of 72 slices shown (4 of 9)]
[im 1/72]
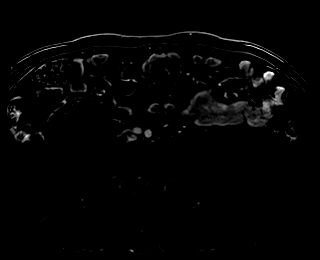
[im 36/72]
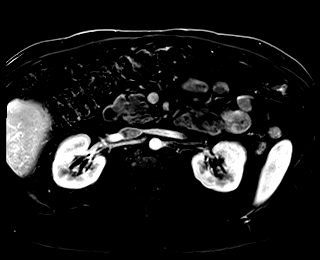
[im 72/72]
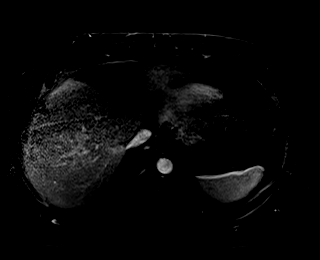

[Series 16: T1 dynamic post-contrast · axial · 3.0mm · 1.34mm/px · z∈[-60,+153]mm · 3 of 72 slices shown (5 of 9)]
[im 1/72]
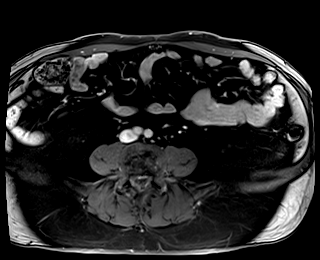
[im 36/72]
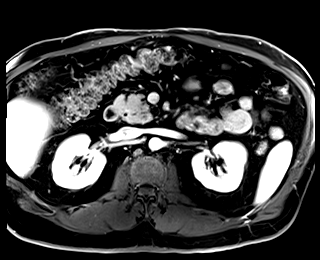
[im 72/72]
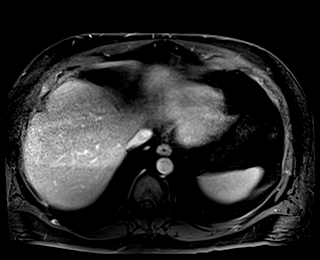

[Series 17: T1 dynamic post-contrast · axial · 3.0mm · 1.34mm/px · z∈[-60,+153]mm · 3 of 72 slices shown (6 of 9)]
[im 1/72]
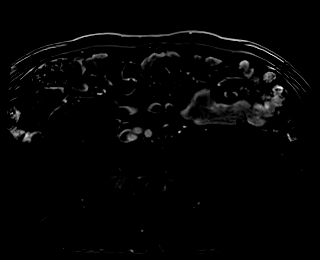
[im 36/72]
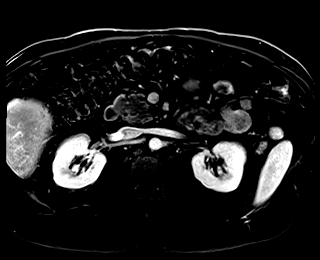
[im 72/72]
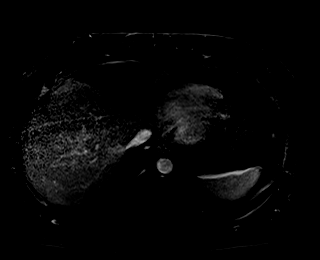

[Series 18: T1 dynamic post-contrast · coronal · 3.0mm · 1.34mm/px · 3 of 72 slices shown (7 of 9)]
[im 1/72]
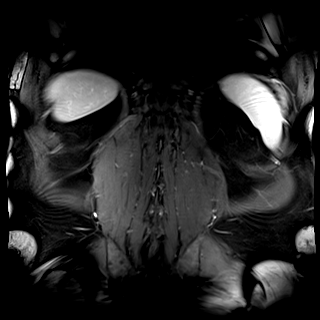
[im 36/72]
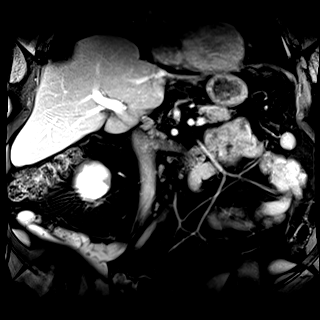
[im 72/72]
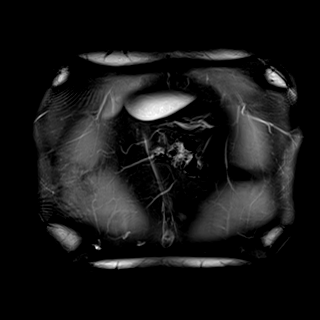

[Series 19: T1 dynamic post-contrast · axial · 3.0mm · 1.34mm/px · z∈[-60,+153]mm · 3 of 72 slices shown (8 of 9)]
[im 1/72]
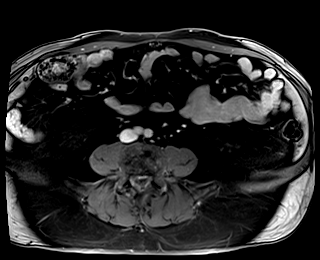
[im 36/72]
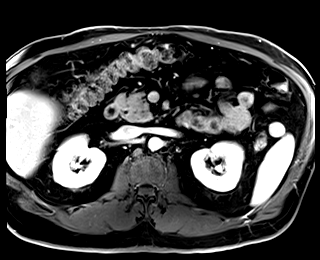
[im 72/72]
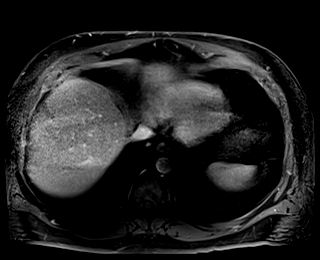

[Series 20: T1 dynamic post-contrast · axial · 3.0mm · 1.34mm/px · z∈[-60,+153]mm · 3 of 72 slices shown (9 of 9)]
[im 1/72]
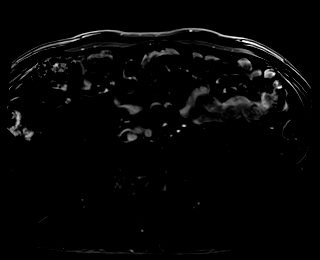
[im 36/72]
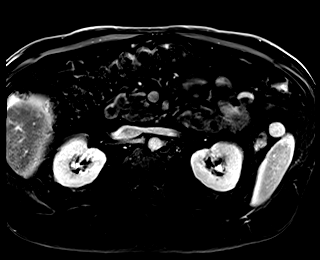
[im 72/72]
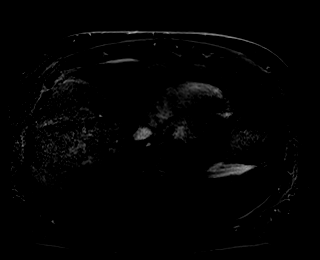

[48 of 48 positions shown; findings below may reference images not displayed]

FINDINGS: Lower chest:  Unremarkable.

Hepatobiliary: Within segment 7 of the liver there is a sub cm T1
hypointense, T2 hyperintense, nonenhancing lesion, compatible with a
tiny simple cyst or biliary hamartoma. No other larger suspicious
appearing hepatic lesions are noted. No intra or extrahepatic
biliary ductal dilatation. Gallbladder is normal in appearance.

Pancreas: With no pancreatic mass. No pancreatic ductal dilatation.
No pancreatic or peripancreatic fluid or inflammatory changes.

Spleen: Unremarkable.

Adrenals/Urinary Tract: In the posterior aspect of the upper pole of
the right kidney there is an exophytic 2.4 cm lesion that is T1
hypointense, T2 hyperintense, and does not enhance, compatible with
a simple cyst. No suspicious renal lesions are noted. Left kidney is
normal in appearance. Bilateral adrenal glands are normal in
appearance. No hydroureteronephrosis in the visualized portions of
the abdomen.

Stomach/Bowel: Normal appearance of the stomach. No pathologic
dilatation of visualized portions of small bowel or colon.

Vascular/Lymphatic: No aneurysm identified in the visualized
abdominal vasculature. No lymphadenopathy noted in the abdomen.

Other: No significant volume of ascites visualized in the peritoneal
cavity.

Musculoskeletal: No aggressive osseous lesions are noted in the
visualized portions of the skeleton.
IMPRESSION: 1. The lesion of concern in the posterior aspect of the upper pole
of the right kidney has imaging characteristics compatible with a
simple cyst. This is a benign finding.
2. Additional incidental findings, as above.

## 2016-12-16 ENCOUNTER — Ambulatory Visit (INDEPENDENT_AMBULATORY_CARE_PROVIDER_SITE_OTHER): Payer: Managed Care, Other (non HMO) | Admitting: Family Medicine

## 2016-12-16 ENCOUNTER — Encounter: Payer: Self-pay | Admitting: Family Medicine

## 2016-12-16 VITALS — BP 132/76 | HR 86 | Temp 98.2°F | Resp 16 | Ht 68.0 in | Wt 270.6 lb

## 2016-12-16 DIAGNOSIS — E119 Type 2 diabetes mellitus without complications: Secondary | ICD-10-CM | POA: Diagnosis not present

## 2016-12-16 DIAGNOSIS — Z23 Encounter for immunization: Secondary | ICD-10-CM | POA: Diagnosis not present

## 2016-12-16 DIAGNOSIS — I10 Essential (primary) hypertension: Secondary | ICD-10-CM | POA: Diagnosis not present

## 2016-12-16 DIAGNOSIS — E785 Hyperlipidemia, unspecified: Secondary | ICD-10-CM

## 2016-12-16 DIAGNOSIS — E1165 Type 2 diabetes mellitus with hyperglycemia: Secondary | ICD-10-CM | POA: Diagnosis not present

## 2016-12-16 MED ORDER — DULAGLUTIDE 1.5 MG/0.5ML ~~LOC~~ SOAJ: 1.5000 mg | SUBCUTANEOUS | 3 refills | Status: DC

## 2016-12-16 MED ORDER — ZOSTER VAC RECOMB ADJUVANTED 50 MCG/0.5ML IM SUSR: 0.5000 mL | Freq: Once | INTRAMUSCULAR | 1 refills | Status: AC

## 2016-12-16 MED ORDER — EMPAGLIFLOZIN 25 MG PO TABS: 25.0000 mg | ORAL_TABLET | Freq: Every day | ORAL | 3 refills | Status: DC

## 2016-12-16 NOTE — Progress Notes (Signed)
By signing my name below, I, [name], attest that this documentation has been prepared under the direction and in the presence of [name], MD.  Electronically Signed: [name], Medical Scribe. 12/16/16. 5:28 PM.  Subjective:    Patient ID: Brandon Rich, male    DOB: 27-Feb-1961, 56 y.o.   MRN: [id number]  HPI Chief Complaint  Patient presents with  . Diabetes    states one of the meds needs adjusting  . Follow-up    if get 30 day supply send to [employer], 90 day to mail service    HPI Comments: Brandon Rich is a 56 y.o. male who presents to Primary Care at [facility] for DM follow-up. Pt is not fasting.  DM: He had some fasting blood work done a few weeks ago and A1c was uncontrolled at that time. DM was uncontrolled previously. Last visit was Jan 25th, at that time due to insurance changes he was changed to jardiacce 25 mg and trulicity 1.5 ZJ/6.7HA. His endocrinologist in 2014 was Dr. Dagmar Hait. Pt is compliant wth trulicity (1x a week), jardiance, and metformin. His blood sugar has been measuring 125 - 190. In the morning it's 190, during the day before a meal it's 140-150, and after "going a while without eating" it's 120-130. Pt has changed his diet (cutting back on sugars and quantities of meals), and he's been exercising more by walking. Pt used to see an endocrinologist when his A1c was uncontrolled and when the endocrinologist managed it, he went back to his PCP for management. Pt last saw ophtho in Dec and has a upcoming appt within 7 days. Denies yeast infection, rashes, chest pain, light-headedness, dizziness, other acute sxs, or negative side effects. Lab Results  Component Value Date   HGBA1C 9.3 (H) 12/02/2016   Lab Results  Component Value Date   MICROALBUR 1.3 06/03/2016   Wt Readings from Last 3 Encounters:  12/16/16 270 lb 9.6 oz (122.7 kg)  08/12/16 269 lb 6.4 oz (122.2 kg)  06/03/16 265 lb 6.4 oz (120.4 kg)   HLD: He is currently  taking lipitor 20 mg QD. Pt is complaint with his medication and he hasn't experienced negative side effects, or acute side effects from it. Lab Results  Component Value Date   CHOL 107 06/03/2016   HDL 28 (L) 06/03/2016   LDLCALC 44 06/03/2016   TRIG 176 (H) 06/03/2016   CHOLHDL 3.8 06/03/2016   Lab Results  Component Value Date   ALT 25 06/03/2016   AST 46 (H) 06/03/2016   ALKPHOS 60 06/03/2016   BILITOT 0.6 06/03/2016   Colon CA Screening: Pt plans on scheduling a colonoscopy.  Immunizations: Pt agrees to his tdap today. Immunization History  Administered Date(s) Administered  . Influenza Split 04/04/2012, 04/14/2013  . Influenza,inj,Quad PF,36+ Mos 04/14/2013, 04/24/2014, 06/16/2015, 03/27/2016  . Pneumococcal Conjugate-13 06/16/2015  . Pneumococcal Polysaccharide-23 07/19/2006, 06/06/2014   Patient Active Problem List   Diagnosis Date Noted  . LVH (left ventricular hypertrophy) 03/29/2016  . FUO (fever of unknown origin) 02/16/2016  . Acute combined systolic and diastolic heart failure ([place]) 02/16/2016  . Weight gain 02/16/2016  . Weight loss 02/16/2016  . Rash and nonspecific skin eruption 02/16/2016  . DOE (dyspnea on exertion) 02/16/2016  . T wave inversion in EKG 02/16/2016  . Transaminitis 02/16/2016  . HTN (hypertension) 12/20/2011  . Diabetes mellitus ([place]) 12/20/2011  . Dyslipidemia 12/20/2011   Past Medical History:  Diagnosis Date  . Acute combined  systolic and diastolic heart failure ([place]) 02/16/2016  . Allergy   . Diabetes mellitus   . DOE (dyspnea on exertion) 02/16/2016  . FUO (fever of unknown origin) 02/16/2016  . Glaucoma   . Hyperlipidemia   . Hypertension   . LVH (left ventricular hypertrophy) 03/29/2016  . Rash and nonspecific skin eruption 02/16/2016  . T wave inversion in EKG 02/16/2016  . Transaminitis 02/16/2016  . Weight gain 02/16/2016  . Weight loss 02/16/2016   Past Surgical History:  Procedure Laterality Date  . HERNIA REPAIR    .  SPINE SURGERY    . TUMOR REMOVAL     No Known Allergies Prior to Admission medications   Medication Sig Start Date End Date Taking? Authorizing Provider  Ascorbic Acid (VITAMIN C) 1000 MG tablet Take 1,000 mg by mouth daily.   Yes [provider]  aspirin 81 MG tablet Take 81 mg by mouth daily.   Yes [provider]  atorvastatin (LIPITOR) 20 MG tablet TAKE 1 TABLET BY MOUTH EVERY DAY 11/05/16  Yes [name], MD  brimonidine (ALPHAGAN) 0.2 % ophthalmic solution Place 1 drop into both eyes 2 (two) times daily.   Yes [provider]  calcium carbonate (TUMS - DOSED IN MG ELEMENTAL CALCIUM) 500 MG chewable tablet Chew 2 tablets by mouth daily as needed for indigestion or heartburn.   Yes [provider]  Dulaglutide (TRULICITY) 1.5 IO/9.7DZ SOPN Inject 1.5 mg into the skin once a week. 08/12/16  Yes [name], MD  empagliflozin (JARDIANCE) 25 MG TABS tablet Take 25 mg by mouth daily. 08/12/16  Yes [name], MD  fexofenadine (ALLEGRA) 180 MG tablet Take 180 mg by mouth daily.   Yes [provider]  fluticasone (FLONASE) 50 MCG/ACT nasal spray PLACE 2 SPRAYS INTO BOTH NOSTRILS DAILY. 07/09/14  Yes Guest, Benn Moulder, MD  ibuprofen (ADVIL,MOTRIN) 600 MG tablet Take 600 mg by mouth every 6 (six) hours as needed.   Yes [provider]  lisinopril (PRINIVIL,ZESTRIL) 20 MG tablet Take 1 tablet (20 mg total) by mouth daily. 06/03/16  Yes [name], MD  metFORMIN (GLUCOPHAGE) 1000 MG tablet Take 1 tablet (1,000 mg total) by mouth 2 (two) times daily with a meal. 06/03/16  Yes [name], MD  Multiple Vitamin (MULTIVITAMIN) tablet Take 1 tablet by mouth every evening.    Yes [provider]  timolol (BETIMOL) 0.5 % ophthalmic solution Place 1 drop into both eyes at bedtime.    Yes [provider]  ciprofloxacin (CIPRO) 500 MG tablet Take 1 tablet (500 mg total) by mouth 2 (two) times daily. For up to 3  days at onset of symptoms. Patient not taking: Reported on 12/16/2016 08/12/16   [name], MD   Social History   Social History  . Marital status: Unknown    Spouse name: N/A  . Number of children: 2  . Years of education: 16   Occupational History  . Tour manager    Social History Main Topics  . Smoking status: Never Smoker  . Smokeless tobacco: Never Used  . Alcohol use No     Comment: 1 drink  . Drug use: No  . Sexual activity: Yes    Partners: Female   Other Topics Concern  . Not on file   Social History Narrative   Married. Education: [employer].    Review of Systems  Constitutional: Negative for fatigue and unexpected weight change.  Eyes: Negative for  visual disturbance.  Respiratory: Negative for cough, chest tightness and shortness of breath.   Cardiovascular: Negative for chest pain, palpitations and leg swelling.  Gastrointestinal: Negative for abdominal pain.  Musculoskeletal: Negative for myalgias.  Skin: Negative for rash.  Neurological: Negative for dizziness, light-headedness and headaches.   Objective:  Physical Exam  Constitutional: He is oriented to person, place, and time. He appears well-developed and well-nourished. No distress.  HENT:  Head: Normocephalic and atraumatic.  Eyes: Conjunctivae and EOM are normal. Pupils are equal, round, and reactive to light.  Neck: Neck supple. No JVD present. Carotid bruit is not present.  Cardiovascular: Normal rate, regular rhythm and normal heart sounds.  Exam reveals no gallop and no friction rub.   No murmur heard. Pulmonary/Chest: Effort normal and breath sounds normal. No respiratory distress. He has no wheezes. He has no rales.  Abdominal: There is no tenderness.  Musculoskeletal: He exhibits no edema.  Neurological: He is alert and oriented to person, place, and time.  Skin: Skin is warm and dry.  Psychiatric: He has a normal mood and affect. His behavior is normal.  Nursing note and  vitals reviewed.   Vitals:   12/16/16 1625  BP: 132/76  Pulse: 86  Resp: 16  Temp: 98.2 F (36.8 C)  TempSrc: Oral  SpO2: 95%  Weight: 270 lb 9.6 oz (122.7 kg)  Height: 5\' 8"  (1.727 m)  Body mass index is 41.14 kg/m. Assessment & Plan:   Brandon Rich is a 56 y.o. male Type 2 diabetes mellitus with hyperglycemia, without long-term current use of insulin ([place]) - Plan: Ambulatory referral to Endocrinology Type 2 diabetes mellitus without complication, without long-term current use of insulin ([place]) - Plan: Dulaglutide (TRULICITY) 1.5 NI/7.7OE SOPN, empagliflozin (JARDIANCE) 25 MG TABS tablet  -Uncontrolled even with increased exercise, diet changes. Will refer to endocrinology again to discuss potential change to insulin regimen. Continue same dose of medications for now until he sees endocrine.  Hyperlipidemia, unspecified hyperlipidemia type - Plan: Hepatic Function Panel, Lipid panel  -Check lipid panel, hepatic function panel. Tolerating Lipitor, continue same dose for now.  Essential hypertension  -Controlled, continue same dose of medication. Recent creatinine okay.  Need for Tdap vaccination - Plan: Tdap vaccine greater than or equal to 7yo IM given   Need for shingles vaccine - Plan: Zoster Vac Recomb Adjuvanted [facility]) injection prescribed for dispensing at pharmacy.  Meds ordered this encounter  Medications  . Dulaglutide (TRULICITY) 1.5 UM/3.5TI SOPN    Sig: Inject 1.5 mg into the skin once a week.    Dispense:  2 mL    Refill:  3  . empagliflozin (JARDIANCE) 25 MG TABS tablet    Sig: Take 25 mg by mouth daily.    Dispense:  30 tablet    Refill:  3  . Zoster Vac Recomb Adjuvanted [facility]) injection    Sig: Inject 0.5 mLs into the muscle once. Repeat injection once in 2-6 months.    Dispense:  0.5 mL    Refill:  1   Patient Instructions    I will refer you to endocrinology to discus insulin regimen an changes to current meds.   Check blood sugar  fasting and 2 hours after meals, bring record to endocrinology.  Shingles vaccine was sent to pharmacy. Tetanus vaccine given today.  Return for fasting blood work at your convenience within the next few weeks if possible. Let me know when you need a refill of Lipitor and where to send that.  IF you received an x-ray today, you will receive an invoice from [facility]. Please contact [facility] at [phone] with questions or concerns regarding your invoice.   IF you received labwork today, you will receive an invoice from [place]. Please contact [facility] at [phone] with questions or concerns regarding your invoice.   Our billing staff will not be able to assist you with questions regarding bills from these companies.  You will be contacted with the lab results as soon as they are available. The fastest way to get your results is to activate your My Chart account. Instructions are located on the last page of this paperwork. If you have not heard from [place] regarding the results in 2 weeks, please contact this office.       I personally performed the services described in this documentation, which was scribed in my presence. The recorded information has been reviewed and considered for accuracy and completeness, addended by me as needed, and agree with information above.  Signed,   [name], MD Primary Care at [place].  12/18/16 12:13 PM

## 2016-12-16 NOTE — Patient Instructions (Addendum)
  I will refer you to endocrinology to discus insulin regimen an changes to current meds.   Check blood sugar fasting and 2 hours after meals, bring record to endocrinology.  Shingles vaccine was sent to pharmacy. Tetanus vaccine given today.  Return for fasting blood work at your convenience within the next few weeks if possible. Let me know when you need a refill of Lipitor and where to send that.  IF you received an x-ray today, you will receive an invoice from [facility]. Please contact [facility] at [phone] with questions or concerns regarding your invoice.   IF you received labwork today, you will receive an invoice from [place]. Please contact [facility] at [phone] with questions or concerns regarding your invoice.   Our billing staff will not be able to assist you with questions regarding bills from these companies.  You will be contacted with the lab results as soon as they are available. The fastest way to get your results is to activate your My Chart account. Instructions are located on the last page of this paperwork. If you have not heard from [place] regarding the results in 2 weeks, please contact this office.

## 2017-01-19 ENCOUNTER — Ambulatory Visit (HOSPITAL_COMMUNITY)
Admission: EM | Admit: 2017-01-19 | Discharge: 2017-01-19 | Disposition: A | Discharge status: Home / Self Care | Payer: Managed Care, Other (non HMO) | Attending: Family Medicine | Admitting: Family Medicine

## 2017-01-19 ENCOUNTER — Encounter (HOSPITAL_COMMUNITY): Payer: Self-pay | Admitting: *Deleted

## 2017-01-19 DIAGNOSIS — W274XXA Contact with kitchen utensil, initial encounter: Secondary | ICD-10-CM | POA: Diagnosis not present

## 2017-01-19 DIAGNOSIS — S61209A Unspecified open wound of unspecified finger without damage to nail, initial encounter: Secondary | ICD-10-CM

## 2017-01-19 DIAGNOSIS — S61202A Unspecified open wound of right middle finger without damage to nail, initial encounter: Secondary | ICD-10-CM

## 2017-01-19 NOTE — Discharge Instructions (Addendum)
Today you were diagnosed with the following: 1. Fingertip avulsion, initial encounter    You have not been prescribed prescription medications this visit.  You may carefully remove your bandage in 24 hours.  If you are not improving over the next few days or feel you are worsening please follow up here or the Emergency Department if you are unable to see your regular doctor.

## 2017-01-19 NOTE — ED Provider Notes (Addendum)
  [place]   [id number] 01/19/17 Arrival Time: 8657  ASSESSMENT & PLAN:  Today you were diagnosed with the following: 1. Fingertip avulsion, initial encounter    You have not been prescribed prescription medications this visit.  You may carefully remove your bandage in 24 hours.  If you are not improving over the next few days or feel you are worsening please follow up here or the Emergency Department if you are unable to see your regular doctor.  Fingertip avulsion covered with Surgicel and pressure dressing. After waiting 10 min bandage shows no bleeding. Wound care instruction discussed along with details of how to remove bandage and Surgicel.  Reviewed expectations re: course of current medical issues. Questions answered. Outlined signs and symptoms indicating need for more acute intervention. Patient verbalized understanding. After Visit Summary given.   SUBJECTIVE:  Brandon Rich is a 56 y.o. male who presents with complaint of fingertip injury. Canning today and finger slipped against edge. Difficult to control bleeding. Injury approx 2 hours ago. Painful and throbbing.  Last Td this year.  ROS: As per HPI.   OBJECTIVE:  Vitals:   01/19/17 1836  BP: 130/72  Pulse: 80  Resp: 18  Temp: 98.6 F (37 C)  TempSrc: Oral  SpO2: 100%    General appearance: alert; no distress Extremities: L 3rd finger with fingertip avulsion; active bleeding; no exposed bone; FROM; normal strength Neuro: sensation intact L 3rd finger  No Known Allergies  PMHx, SurgHx, SocialHx, Medications, and Allergies were reviewed in the Visit Navigator and updated as appropriate.       Vanessa Kick, MD 01/19/17 Doug Sou, MD 01/19/17 [id number]

## 2017-01-19 NOTE — ED Triage Notes (Signed)
Lac  To   Tip    Of  r  Middle   Finger        Avulsion  Present    Sustained      While        Using  A  Grater      No   Nail bed  Involvement

## 2017-01-21 ENCOUNTER — Ambulatory Visit (INDEPENDENT_AMBULATORY_CARE_PROVIDER_SITE_OTHER): Payer: Managed Care, Other (non HMO) | Admitting: Family Medicine

## 2017-01-21 DIAGNOSIS — E785 Hyperlipidemia, unspecified: Secondary | ICD-10-CM

## 2017-01-22 LAB — LIPID PANEL
CHOL/HDL RATIO: 3.5 ratio (ref 0.0–5.0)
Cholesterol, Total: 95 mg/dL — ABNORMAL LOW (ref 100–199)
HDL: 27 mg/dL — AB (ref >39)
LDL Calculated: 24 mg/dL (ref 0–99)
Triglycerides: 218 mg/dL — ABNORMAL HIGH (ref 0–149)
VLDL CHOLESTEROL CAL: 44 mg/dL — AB (ref 5–40)

## 2017-01-22 LAB — HEPATIC FUNCTION PANEL
ALBUMIN: 4.7 g/dL (ref 3.5–5.5)
ALT: 20 IU/L (ref 0–44)
AST: 39 IU/L (ref 0–40)
Alkaline Phosphatase: 66 IU/L (ref 39–117)
BILIRUBIN TOTAL: 0.5 mg/dL (ref 0.0–1.2)
Bilirubin, Direct: 0.15 mg/dL (ref 0.00–0.40)
Total Protein: 7.2 g/dL (ref 6.0–8.5)

## 2017-01-27 ENCOUNTER — Other Ambulatory Visit: Payer: Self-pay | Admitting: *Deleted

## 2017-01-27 DIAGNOSIS — I10 Essential (primary) hypertension: Secondary | ICD-10-CM

## 2017-01-27 DIAGNOSIS — E785 Hyperlipidemia, unspecified: Secondary | ICD-10-CM

## 2017-01-27 MED ORDER — ATORVASTATIN CALCIUM 20 MG PO TABS: 20.0000 mg | ORAL_TABLET | Freq: Every day | ORAL | 0 refills | Status: DC

## 2017-02-03 ENCOUNTER — Other Ambulatory Visit: Payer: Self-pay | Admitting: Family Medicine

## 2017-02-03 DIAGNOSIS — E785 Hyperlipidemia, unspecified: Secondary | ICD-10-CM

## 2017-02-03 DIAGNOSIS — I10 Essential (primary) hypertension: Secondary | ICD-10-CM

## 2017-02-05 NOTE — Progress Notes (Signed)
Lab visit only. 

## 2017-02-05 NOTE — Addendum Note (Signed)
Addended by: Wardell Honour on: 02/05/2017 02:03 PM   Modules accepted: Level of Service

## 2017-03-30 DIAGNOSIS — H25013 Cortical age-related cataract, bilateral: Secondary | ICD-10-CM | POA: Insufficient documentation

## 2017-03-30 DIAGNOSIS — H401134 Primary open-angle glaucoma, bilateral, indeterminate stage: Secondary | ICD-10-CM | POA: Insufficient documentation

## 2017-03-30 DIAGNOSIS — H2513 Age-related nuclear cataract, bilateral: Secondary | ICD-10-CM | POA: Insufficient documentation

## 2017-03-30 DIAGNOSIS — E119 Type 2 diabetes mellitus without complications: Secondary | ICD-10-CM | POA: Insufficient documentation

## 2017-03-30 DIAGNOSIS — E11621 Type 2 diabetes mellitus with foot ulcer: Secondary | ICD-10-CM | POA: Insufficient documentation

## 2017-04-11 ENCOUNTER — Encounter: Payer: Self-pay | Admitting: Family Medicine

## 2017-04-16 ENCOUNTER — Ambulatory Visit (INDEPENDENT_AMBULATORY_CARE_PROVIDER_SITE_OTHER): Payer: Managed Care, Other (non HMO) | Admitting: Family Medicine

## 2017-04-16 DIAGNOSIS — Z23 Encounter for immunization: Secondary | ICD-10-CM

## 2017-04-19 NOTE — Addendum Note (Signed)
Addended by: Wardell Honour on: 04/19/2017 04:05 PM   Modules accepted: Level of Service

## 2017-05-11 ENCOUNTER — Other Ambulatory Visit: Payer: Self-pay | Admitting: Family Medicine

## 2017-05-11 DIAGNOSIS — E119 Type 2 diabetes mellitus without complications: Secondary | ICD-10-CM

## 2017-05-11 DIAGNOSIS — I10 Essential (primary) hypertension: Secondary | ICD-10-CM

## 2017-05-11 DIAGNOSIS — E785 Hyperlipidemia, unspecified: Secondary | ICD-10-CM

## 2017-06-23 ENCOUNTER — Ambulatory Visit: Payer: Managed Care, Other (non HMO) | Admitting: Family Medicine

## 2017-06-23 ENCOUNTER — Encounter: Payer: Self-pay | Admitting: Family Medicine

## 2017-06-23 ENCOUNTER — Other Ambulatory Visit: Payer: Self-pay

## 2017-06-23 DIAGNOSIS — I10 Essential (primary) hypertension: Secondary | ICD-10-CM | POA: Diagnosis not present

## 2017-06-23 DIAGNOSIS — E785 Hyperlipidemia, unspecified: Secondary | ICD-10-CM | POA: Diagnosis not present

## 2017-06-23 DIAGNOSIS — E119 Type 2 diabetes mellitus without complications: Secondary | ICD-10-CM

## 2017-06-23 DIAGNOSIS — Z794 Long term (current) use of insulin: Secondary | ICD-10-CM | POA: Diagnosis not present

## 2017-06-23 MED ORDER — LISINOPRIL 20 MG PO TABS: 20.0000 mg | ORAL_TABLET | Freq: Every day | ORAL | 1 refills | Status: DC

## 2017-06-23 MED ORDER — ATORVASTATIN CALCIUM 20 MG PO TABS: 20.0000 mg | ORAL_TABLET | Freq: Every day | ORAL | 1 refills | Status: DC

## 2017-06-23 MED ORDER — METFORMIN HCL 1000 MG PO TABS: 1000.0000 mg | ORAL_TABLET | Freq: Two times a day (BID) | ORAL | 1 refills | Status: DC

## 2017-06-23 NOTE — Patient Instructions (Addendum)
  I would discuss any further changes of Basaglar with Dr. Buddy Duty. No other changes in regimen for now.   Exercise most days per week.   Make sure you are drinking plenty of water. If any continued lightheadedness with sitting up, may need to meet to see if med changes are needed.   Please schedule colonoscopy - you may not need referral, but let me know if one is needed. Dr. Carlean Purl: [phone].   We can plan on lab work at physical early next year. Thanks for coming in today.   IF you received an x-ray today, you will receive an invoice from [facility]. Please contact [facility] Radiology at [phone] with questions or concerns regarding your invoice.   IF you received labwork today, you will receive an invoice from [place]. Please contact [facility] at [phone] with questions or concerns regarding your invoice.   Our billing staff will not be able to assist you with questions regarding bills from these companies.  You will be contacted with the lab results as soon as they are available. The fastest way to get your results is to activate your My Chart account. Instructions are located on the last page of this paperwork. If you have not heard from [place] regarding the results in 2 weeks, please contact this office.

## 2017-06-23 NOTE — Progress Notes (Signed)
Subjective:  This chart was scribed for [name], MD by Tamsen Roers, at [place] at [facility].  This patient was seen in room 11 and the patient's care was started at 8:48 AM.   Chief Complaint  Patient presents with  . Follow-up    DM check     Patient ID: Brandon Rich, male    DOB: Dec 12, 1960, 56 y.o.   MRN: [id number]  HPI HPI Comments: Brandon Rich is a 56 y.o. male who presents to Primary Care at [facility] for a follow up on diabetes.  Last office visit was May 31 st.  At that time he was taking Trulicity, Jardiace and Metformin. He had been previously followed by endocrinology but transferred to PCP once diabetes improved.  I referred him back to endocrinology to discuss potential changes including insulin. --- Last visit with Dr. Buddy Duty was (November 5th) when he was switched to [facility] and was told to stop taking Trulicity.  He is taking Metfrom 100 mg twice per day and and Jardiace 25 mg. Patient denies any symptomatic lows and does "feel better". His most recent lowest reading has been 100 (but this was prior to taking Basaglar).  He does feel like he has been doing "much better" since starting the [place]. Patient will be seeing Dr. Buddy Duty again on February 15 th.   He has an eye doctor and sees him regularly (last seen  September 12 th).   Lab Results  Component Value Date   HGBA1C 9.3 (H) 12/02/2016   Lab Results  Component Value Date   MICROALBUR 1.3 06/03/2016    Hyperlipidemia: He was continued on Lipitor. -- Patient exercises 2 times per week (early in the mornings before work).   He feels that he has gained some weight since coming off of the Trilicty and Victoza and would like to start exercising a bit more.  Patient is compliant with his Lipitor and  Lisinopril 20 mg once per day.  He denies any side effects.   Lab Results  Component Value Date   ALT 20 01/21/2017   AST 39 01/21/2017   ALKPHOS 66 01/21/2017   BILITOT 0.5 01/21/2017     Lab Results  Component Value Date   CHOL 95 (L) 01/21/2017   HDL 27 (L) 01/21/2017   LDLCALC 24 01/21/2017   TRIG 218 (H) 01/21/2017   CHOLHDL 3.5 01/21/2017    Dizziness: Patient notes of some dizziness intermittently for the past couple of week especially when sitting up from a lying down position. He states that it does not occur daily.   Patient has not yet had a colonoscopy and would like to be referred for one. Last one was May 2005 with Dr. Carlean Purl.    Patient Active Problem List   Diagnosis Date Noted  . LVH (left ventricular hypertrophy) 03/29/2016  . FUO (fever of unknown origin) 02/16/2016  . Acute combined systolic and diastolic heart failure ([place]) 02/16/2016  . Weight gain 02/16/2016  . Weight loss 02/16/2016  . Rash and nonspecific skin eruption 02/16/2016  . DOE (dyspnea on exertion) 02/16/2016  . T wave inversion in EKG 02/16/2016  . Transaminitis 02/16/2016  . HTN (hypertension) 12/20/2011  . Diabetes mellitus ([place]) 12/20/2011  . Dyslipidemia 12/20/2011   Past Medical History:  Diagnosis Date  . Acute combined systolic and diastolic heart failure ([place]) 02/16/2016  . Allergy   . Diabetes mellitus   . DOE (dyspnea on exertion) 02/16/2016  . FUO (fever of  unknown origin) 02/16/2016  . Glaucoma   . Hyperlipidemia   . Hypertension   . LVH (left ventricular hypertrophy) 03/29/2016  . Rash and nonspecific skin eruption 02/16/2016  . T wave inversion in EKG 02/16/2016  . Transaminitis 02/16/2016  . Weight gain 02/16/2016  . Weight loss 02/16/2016   Past Surgical History:  Procedure Laterality Date  . HERNIA REPAIR    . SPINE SURGERY    . TUMOR REMOVAL     No Known Allergies Prior to Admission medications   Medication Sig Start Date End Date Taking? Authorizing Provider  Ascorbic Acid (VITAMIN C) 1000 MG tablet Take 1,000 mg by mouth daily.    [provider]  aspirin 81 MG tablet Take 81 mg by mouth daily.    [provider]   atorvastatin (LIPITOR) 20 MG tablet TAKE 1 TABLET BY MOUTH EVERY DAY 05/12/17   [name], MD  brimonidine (ALPHAGAN) 0.2 % ophthalmic solution Place 1 drop into both eyes 2 (two) times daily.    [provider]  calcium carbonate (TUMS - DOSED IN MG ELEMENTAL CALCIUM) 500 MG chewable tablet Chew 2 tablets by mouth daily as needed for indigestion or heartburn.    [provider]  Dulaglutide (TRULICITY) 1.5 KV/4.2VZ SOPN Inject 1.5 mg into the skin once a week. 12/16/16   [name], MD  empagliflozin (JARDIANCE) 25 MG TABS tablet Take 25 mg by mouth daily. 12/16/16   [name], MD  fexofenadine (ALLEGRA) 180 MG tablet Take 180 mg by mouth daily.    [provider]  fluticasone (FLONASE) 50 MCG/ACT nasal spray PLACE 2 SPRAYS INTO BOTH NOSTRILS DAILY. 07/09/14   Orma Flaming, MD  ibuprofen (ADVIL,MOTRIN) 600 MG tablet Take 600 mg by mouth every 6 (six) hours as needed.    [provider]  lisinopril (PRINIVIL,ZESTRIL) 20 MG tablet TAKE 1 TABLET BY MOUTH EVERY DAY 05/12/17   [name], MD  metFORMIN (GLUCOPHAGE) 1000 MG tablet TAKE 1 TABLET BY MOUTH TWICE DAILY WITH MEALS 05/12/17   [name], MD  Multiple Vitamin (MULTIVITAMIN) tablet Take 1 tablet by mouth every evening.     [provider]  timolol (BETIMOL) 0.5 % ophthalmic solution Place 1 drop into both eyes at bedtime.     [provider]   Social History   Socioeconomic History  . Marital status: Unknown    Spouse name: Not on file  . Number of children: 2  . Years of education: 30  . Highest education level: Not on file  Social Needs  . Financial resource strain: Not on file  . Food insecurity - worry: Not on file  . Food insecurity - inability: Not on file  . Transportation needs - medical: Not on file  . Transportation needs - non-medical: Not on file  Occupational History  . Occupation: Tour manager  Tobacco Use  . Smoking  status: Never Smoker  . Smokeless tobacco: Never Used  Substance and Sexual Activity  . Alcohol use: No    Alcohol/week: 0.0 oz    Comment: 1 drink  . Drug use: No  . Sexual activity: Yes    Partners: Female  Other Topics Concern  . Not on file  Social History Narrative   Married. Education: [employer].     Review of Systems  Constitutional: Negative for chills and fever.  Eyes: Negative for pain, redness and itching.  Respiratory: Negative for cough, choking and shortness of breath.  Cardiovascular: Negative for chest pain.  Gastrointestinal: Negative for nausea and vomiting.  Musculoskeletal: Negative for myalgias.  Neurological: Positive for dizziness. Negative for syncope and speech difficulty.       Objective:   Physical Exam  Constitutional: He is oriented to person, place, and time. He appears well-developed and well-nourished.  HENT:  Head: Normocephalic and atraumatic.  Eyes: EOM are normal. Pupils are equal, round, and reactive to light.  Neck: No JVD present. Carotid bruit is not present.  Cardiovascular: Normal rate, regular rhythm and normal heart sounds.  No murmur heard. Pulmonary/Chest: Effort normal and breath sounds normal. He has no rales.  Musculoskeletal: He exhibits no edema.  Neurological: He is alert and oriented to person, place, and time.  Skin: Skin is warm and dry.  Psychiatric: He has a normal mood and affect.  Vitals reviewed.   Vitals:   06/23/17 0814  BP: 128/79  Pulse: 68  Resp: 16  Temp: 98.3 F (36.8 C)  TempSrc: Oral  SpO2: 97%  Weight: 286 lb (129.7 kg)  Height: 5\' 8"  (1.727 m)         Assessment & Plan:   Brandon Rich is a 56 y.o. male Type 2 diabetes mellitus without complication, with long-term current use of insulin ([place]) - Plan: metFORMIN (GLUCOPHAGE) 1000 MG tablet  - improving by home readings, but still likely will need higher basal dose with higher fastings. Continue Jardiance, metformin, Basaglar and  follow up with endocrinology as planned.   Hyperlipidemia, unspecified hyperlipidemia type - Plan: metFORMIN (GLUCOPHAGE) 1000 MG tablet, atorvastatin (LIPITOR) 20 MG tablet  - tolerating lipitor same dose. Prior LDL below 70 with DM. Elevated triglycerides related to hyperglycemia at the time. Plan to repeat testing at physical in few months. Prior elevated LFT's normalized last OV.   Essential hypertension - Plan: metFORMIN (GLUCOPHAGE) 1000 MG tablet, lisinopril (PRINIVIL,ZESTRIL) 20 MG tablet, atorvastatin (LIPITOR) 20 MG tablet  - stable with goal less than 130/80.  Few episodes of what sounds like orthostasis as opposed to hypoglycemia.   -maintain hydration, monitor for persistent symptoms and if that occurs - return to eval further.   Meds ordered this encounter  Medications  . metFORMIN (GLUCOPHAGE) 1000 MG tablet    Sig: Take 1 tablet (1,000 mg total) by mouth 2 (two) times daily with a meal.    Dispense:  90 tablet    Refill:  1  . lisinopril (PRINIVIL,ZESTRIL) 20 MG tablet    Sig: Take 1 tablet (20 mg total) by mouth daily.    Dispense:  90 tablet    Refill:  1  . atorvastatin (LIPITOR) 20 MG tablet    Sig: Take 1 tablet (20 mg total) by mouth daily.    Dispense:  90 tablet    Refill:  1   Patient Instructions    I would discuss any further changes of Basaglar with Dr. Buddy Duty. No other changes in regimen for now.   Exercise most days per week.   Make sure you are drinking plenty of water. If any continued lightheadedness with sitting up, may need to meet to see if med changes are needed.   Please schedule colonoscopy - you may not need referral, but let me know if one is needed. Dr. Carlean Purl: [phone].   We can plan on lab work at physical early next year. Thanks for coming in today.   IF you received an x-ray today, you will receive an invoice from [facility]. Please contact [facility]  at [phone] with questions or concerns regarding your  invoice.   IF you received labwork today, you will receive an invoice from [place]. Please contact [facility] at [phone] with questions or concerns regarding your invoice.   Our billing staff will not be able to assist you with questions regarding bills from these companies.  You will be contacted with the lab results as soon as they are available. The fastest way to get your results is to activate your My Chart account. Instructions are located on the last page of this paperwork. If you have not heard from [place] regarding the results in 2 weeks, please contact this office.       I personally performed the services described in this documentation, which was scribed in my presence. The recorded information has been reviewed and considered for accuracy and completeness, addended by me as needed, and agree with information above.  Signed,   [name], MD Primary Care at [place].  06/23/17 9:20 AM

## 2017-09-20 ENCOUNTER — Encounter: Payer: Self-pay | Admitting: Family Medicine

## 2017-09-20 ENCOUNTER — Ambulatory Visit (INDEPENDENT_AMBULATORY_CARE_PROVIDER_SITE_OTHER): Payer: Managed Care, Other (non HMO) | Admitting: Family Medicine

## 2017-09-20 VITALS — BP 137/82 | HR 66 | Temp 98.2°F | Resp 16 | Ht 69.0 in | Wt 295.0 lb

## 2017-09-20 DIAGNOSIS — Z125 Encounter for screening for malignant neoplasm of prostate: Secondary | ICD-10-CM

## 2017-09-20 DIAGNOSIS — E785 Hyperlipidemia, unspecified: Secondary | ICD-10-CM

## 2017-09-20 DIAGNOSIS — Z23 Encounter for immunization: Secondary | ICD-10-CM

## 2017-09-20 DIAGNOSIS — I10 Essential (primary) hypertension: Secondary | ICD-10-CM | POA: Diagnosis not present

## 2017-09-20 DIAGNOSIS — E1165 Type 2 diabetes mellitus with hyperglycemia: Secondary | ICD-10-CM

## 2017-09-20 DIAGNOSIS — Z794 Long term (current) use of insulin: Secondary | ICD-10-CM | POA: Diagnosis not present

## 2017-09-20 DIAGNOSIS — Z0000 Encounter for general adult medical examination without abnormal findings: Secondary | ICD-10-CM

## 2017-09-20 MED ORDER — ZOSTER VAC RECOMB ADJUVANTED 50 MCG/0.5ML IM SUSR: 0.5000 mL | Freq: Once | INTRAMUSCULAR | 1 refills | Status: AC

## 2017-09-20 NOTE — Patient Instructions (Addendum)
Blood pressure borderline elevated, but keep a record of your blood pressures outside of the office. If those remain over 130/80 - let me know.   Please call to schedule colonoscopy.   Shingles vaccine sent to your pharmacy.   Thanks for coming in today.   Keeping you healthy  Get these tests  Blood pressure- Have your blood pressure checked once a year by your healthcare provider.  Normal blood pressure is 120/80  Weight- Have your body mass index (BMI) calculated to screen for obesity.  BMI is a measure of body fat based on height and weight. You can also calculate your own BMI at ViewBanking.[email or web address].  Cholesterol- Have your cholesterol checked every year.  Diabetes- Have your blood sugar checked regularly if you have high blood pressure, high cholesterol, have a family history of diabetes or if you are overweight.  Screening for Colon Cancer- Colonoscopy starting at age 7.  Screening may begin sooner depending on your family history and other health conditions. Follow up colonoscopy as directed by your Gastroenterologist.  Screening for Prostate Cancer- Both blood work (PSA) and a rectal exam help screen for Prostate Cancer.  Screening begins at age 37 with African-American men and at age 46 with Caucasian men.  Screening may begin sooner depending on your family history.  Take these medicines  Aspirin- One aspirin daily can help prevent Heart disease and Stroke.  Flu shot- Every fall.  Tetanus- Every 10 years.  Zostavax- Once after the age of 58 to prevent Shingles.  Pneumonia shot- Once after the age of 58; if you are younger than 58, ask your healthcare provider if you need a Pneumonia shot.  Take these steps  Don't smoke- If you do smoke, talk to your doctor about quitting.  For tips on how to quit, go to www.smokefree.gov or call 1-800-QUIT-NOW.  Be physically active- Exercise 5 days a week for at least 30 minutes.  If you are not already physically active start  slow and gradually work up to 30 minutes of moderate physical activity.  Examples of moderate activity include walking briskly, mowing the yard, dancing, swimming, bicycling, etc.  Eat a healthy diet- Eat a variety of healthy food such as fruits, vegetables, low fat milk, low fat cheese, yogurt, lean meant, poultry, fish, beans, tofu, etc. For more information go to www.thenutritionsource.org  Drink alcohol in moderation- Limit alcohol intake to less than two drinks a day. Never drink and drive.  Dentist- Brush and floss twice daily; visit your dentist twice a year.  Depression- Your emotional health is as important as your physical health. If you're feeling down, or losing interest in things you would normally enjoy please talk to your healthcare provider.  Eye exam- Visit your eye doctor every year.  Safe sex- If you may be exposed to a sexually transmitted infection, use a condom.  Seat belts- Seat belts can save your life; always wear one.  Smoke/Carbon Monoxide detectors- These detectors need to be installed on the appropriate level of your home.  Replace batteries at least once a year.  Skin cancer- When out in the sun, cover up and use sunscreen 15 SPF or higher.  Violence- If anyone is threatening you, please tell your healthcare provider.  Living Will/ Health care power of attorney- Speak with your healthcare provider and family.    IF you received an x-ray today, you will receive an invoice from [facility]. Please contact [facility] at [phone] with questions or concerns regarding your invoice.  IF you received labwork today, you will receive an invoice from [place]. Please contact [facility] at [phone] with questions or concerns regarding your invoice.   Our billing staff will not be able to assist you with questions regarding bills from these companies.  You will be contacted with the lab results as soon as they are available. The fastest way  to get your results is to activate your My Chart account. Instructions are located on the last page of this paperwork. If you have not heard from [place] regarding the results in 2 weeks, please contact this office.

## 2017-09-20 NOTE — Progress Notes (Addendum)
Subjective:  By signing my name below, I, Brandon Rich, attest that this documentation has been prepared under the direction and in the presence of [name], MD Electronically Signed: Ladene Artist, ED Scribe 09/20/2017 at 8:30 AM.   Patient ID: Brandon Rich, male    DOB: 05/25/1961, 57 y.o.   MRN: [id number]  Chief Complaint  Patient presents with  . Annual Exam   HPI Brandon Rich is a 57 y.o. male who presents to Primary Care at [facility] for an annual exam. Last seen 12/6. H/o HTN, DM, hyperlipidemia, elevated LFTs.  DM  Lab Results  Component Value Date   HGBA1C 9.3 (H) 12/02/2016  Referred to endocrinology Dr. Buddy Duty. Continued on Basaglar, Metformin, Jardiance. Visit 11/5 with Dr. Buddy Duty. Planned to titrate from 60 units up on Basaglar. Urine micro albumin 2.25 02/2017. On ACE inhibitor.Last visit with Dr. Buddy Duty 3-4 wks ago. Pt reports A1C ~8. He is currently on 84 units of Basaglar.  Foot exam performed today: Diabetic Foot Exam - Simple   Simple Foot Form Diabetic Foot exam was performed with the following findings:  Yes 09/20/2017  8:09 AM  Visual Inspection See comments:  Yes Sensation Testing See comments:  Yes Pulse Check Posterior Tibialis and Dorsalis pulse intact bilaterally:  Yes Comments Decrease sensation of Great toe, bilateral, some hard skin on Right Foot Great toe.    Wt Readings from Last 3 Encounters:  09/20/17 295 lb (133.8 kg)  06/23/17 286 lb (129.7 kg)  12/16/16 270 lb 9.6 oz (122.7 kg)   HTN Lab Results  Component Value Date   CREATININE 0.88 12/02/2016  Lisinopril 20 mg qd. Denies new side-effects.  Hyperlipidemia Lab Results  Component Value Date   CHOL 95 (L) 01/21/2017   HDL 27 (L) 01/21/2017   LDLCALC 24 01/21/2017   TRIG 218 (H) 01/21/2017   CHOLHDL 3.5 01/21/2017   Lab Results  Component Value Date   ALT 20 01/21/2017   AST 39 01/21/2017   ALKPHOS 66 01/21/2017   BILITOT 0.5 01/21/2017  Lipitor 20 mg  qd.  Elevated LFTs Normalized in July 2018.  CA Screening Colonoscopy: last in 2005; plans to schedule a colonoscopy Prostate CA Screening: Father with h/o prostate CA. Pt agrees to DRE and PSA today. Lab Results  Component Value Date   PSA 1.17 01/09/2016   PSA 1.29 06/16/2015   PSA 1.08 06/06/2014    Immunizations Immunization History  Administered Date(s) Administered  . Influenza Split 04/04/2012, 04/14/2013  . Influenza,inj,Quad PF,6+ Mos 04/14/2013, 04/24/2014, 06/16/2015, 03/27/2016, 04/16/2017  . Pneumococcal Conjugate-13 06/16/2015  . Pneumococcal Polysaccharide-23 07/19/2006, 06/06/2014  . Tdap 12/16/2016  Shingles: sent to pharmacy  Depression Screening Depression screen [facility] 2/9 09/20/2017 12/16/2016 08/12/2016 06/03/2016 02/16/2016  Decreased Interest 0 0 0 0 0  Down, Depressed, Hopeless 0 0 0 0 0  PHQ - 2 Score 0 0 0 0 0     Visual Acuity Screening   Right eye Left eye Both eyes  Without correction:     With correction: 20/15 -1 20/13 -2 20/13 -2   Vision: 03/2017 Dentist: last seen 1 month ago; sees every 6 months Exercise: walks at work   Patient Active Problem List   Diagnosis Date Noted  . LVH (left ventricular hypertrophy) 03/29/2016  . FUO (fever of unknown origin) 02/16/2016  . Acute combined systolic and diastolic heart failure ([place]) 02/16/2016  . Weight gain 02/16/2016  . Weight loss 02/16/2016  . Rash and nonspecific skin eruption  02/16/2016  . DOE (dyspnea on exertion) 02/16/2016  . T wave inversion in EKG 02/16/2016  . Transaminitis 02/16/2016  . HTN (hypertension) 12/20/2011  . Diabetes mellitus ([place]) 12/20/2011  . Dyslipidemia 12/20/2011   Past Medical History:  Diagnosis Date  . Acute combined systolic and diastolic heart failure ([place]) 02/16/2016  . Allergy   . Diabetes mellitus   . DOE (dyspnea on exertion) 02/16/2016  . FUO (fever of unknown origin) 02/16/2016  . Glaucoma   . Heart murmur   . Hyperlipidemia   . Hypertension   .  LVH (left ventricular hypertrophy) 03/29/2016  . Rash and nonspecific skin eruption 02/16/2016  . T wave inversion in EKG 02/16/2016  . Transaminitis 02/16/2016  . Weight gain 02/16/2016  . Weight loss 02/16/2016   Past Surgical History:  Procedure Laterality Date  . HERNIA REPAIR    . SPINE SURGERY    . TUMOR REMOVAL     No Known Allergies Prior to Admission medications   Medication Sig Start Date End Date Taking? Authorizing Provider  Ascorbic Acid (VITAMIN C) 1000 MG tablet Take 1,000 mg by mouth daily.   Yes [provider]  aspirin 81 MG tablet Take 81 mg by mouth daily.   Yes [provider]  atorvastatin (LIPITOR) 20 MG tablet Take 1 tablet (20 mg total) by mouth daily. 06/23/17  Yes [name], MD  brimonidine (ALPHAGAN) 0.2 % ophthalmic solution Place 1 drop into both eyes 2 (two) times daily.   Yes [provider]  empagliflozin (JARDIANCE) 25 MG TABS tablet Take 25 mg by mouth daily. 12/16/16  Yes [name], MD  fexofenadine (ALLEGRA) 180 MG tablet Take 180 mg by mouth daily.   Yes [provider]  fluticasone (FLONASE) 50 MCG/ACT nasal spray PLACE 2 SPRAYS INTO BOTH NOSTRILS DAILY. 07/09/14  Yes GuestBenn Moulder, MD  Insulin Glargine [facility]) 100 UNIT/ML SOPN Inject into the skin. 03/07/17  Yes [provider]  lisinopril (PRINIVIL,ZESTRIL) 20 MG tablet Take 1 tablet (20 mg total) by mouth daily. 06/23/17  Yes [name], MD  metFORMIN (GLUCOPHAGE) 1000 MG tablet Take 1 tablet (1,000 mg total) by mouth 2 (two) times daily with a meal. 06/23/17  Yes [name], MD  Multiple Vitamin (MULTIVITAMIN) tablet Take 1 tablet by mouth every evening.    Yes [provider]  timolol (BETIMOL) 0.5 % ophthalmic solution Place 1 drop into both eyes at bedtime.    Yes [provider]  calcium carbonate (TUMS - DOSED IN MG ELEMENTAL CALCIUM) 500 MG chewable tablet Chew 2 tablets by mouth daily as needed  for indigestion or heartburn.    [provider]  ibuprofen (ADVIL,MOTRIN) 600 MG tablet Take 600 mg by mouth every 6 (six) hours as needed.    [provider]   Social History   Socioeconomic History  . Marital status: Unknown    Spouse name: Not on file  . Number of children: 2  . Years of education: 103  . Highest education level: Not on file  Social Needs  . Financial resource strain: Not on file  . Food insecurity - worry: Not on file  . Food insecurity - inability: Not on file  . Transportation needs - medical: Not on file  . Transportation needs - non-medical: Not on file  Occupational History  . Occupation: Tour manager  Tobacco Use  . Smoking status: Never Smoker  . Smokeless tobacco: Never Used  Substance and  Sexual Activity  . Alcohol use: No    Alcohol/week: 0.0 oz    Comment: 1 drink  . Drug use: No  . Sexual activity: Yes    Partners: Female  Other Topics Concern  . Not on file  Social History Narrative   Married. Education: [employer].    Review of Systems  Constitutional: Positive for unexpected weight change.  Allergic/Immunologic: Positive for environmental allergies.      Objective:   Physical Exam  Constitutional: He is oriented to person, place, and time. He appears well-developed and well-nourished.  HENT:  Head: Normocephalic and atraumatic.  Right Ear: External ear normal.  Left Ear: External ear normal.  Mouth/Throat: Oropharynx is clear and moist.  Eyes: Conjunctivae and EOM are normal. Pupils are equal, round, and reactive to light.  Neck: Normal range of motion. Neck supple. No thyromegaly present.  Cardiovascular: Normal rate, regular rhythm, normal heart sounds and intact distal pulses.  Pulmonary/Chest: Effort normal and breath sounds normal. No respiratory distress. He has no wheezes.  Abdominal: Soft. He exhibits no distension. There is no tenderness. Hernia confirmed negative in the right inguinal area and  confirmed negative in the left inguinal area.  Genitourinary: Prostate normal.  Genitourinary Comments: Few flat external hemorhoids.   Musculoskeletal: Normal range of motion. He exhibits no edema or tenderness.  Lymphadenopathy:    He has no cervical adenopathy.  Neurological: He is alert and oriented to person, place, and time. He has normal reflexes.  Skin: Skin is warm and dry.  Psychiatric: He has a normal mood and affect. His behavior is normal.  Vitals reviewed.   Vitals:   09/20/17 0804  BP: 137/82  Pulse: 66  Resp: 16  Temp: 98.2 F (36.8 C)  TempSrc: Oral  SpO2: 96%  Weight: 295 lb (133.8 kg)  Height: 5\' 9"  (1.753 m)      Assessment & Plan:   RACHIT GRIM is a 57 y.o. male Annual physical exam  - -anticipatory guidance as below in AVS, screening labs above. Health maintenance items as above in HPI discussed/recommended as applicable.   Hyperlipidemia, unspecified hyperlipidemia type - Plan: Comprehensive metabolic panel, Lipid panel  - Tolerating Lipitor, continue same dos,  labs pending  Essential hypertension  - Goal less than 130/80 with history of diabetes. Monitor outside of office. Continue same dose lisinopril for now  Type 2 diabetes mellitus with hyperglycemia, with long-term current use of insulin ([place])  -followed by Dr. Buddy Duty. Tolerating current regimen.   Screening for prostate cancer - Plan: PSA  - We discussed pros and cons of prostate cancer screening, and after this discussion, he chose to have screening done. PSA obtained, and no concerning findings on DRE.   Need for shingles vaccine - Plan: Zoster Vaccine Adjuvanted [facility]) injection sent to pharmacy.    Meds ordered this encounter  Medications  . Zoster Vaccine Adjuvanted [facility]) injection    Sig: Inject 0.5 mLs into the muscle once for 1 dose. Repeat in 2-6 months.    Dispense:  0.5 mL    Refill:  1   Patient Instructions   Blood pressure borderline elevated, but keep a  record of your blood pressures outside of the office. If those remain over 130/80 - let me know.   Please call to schedule colonoscopy.   Shingles vaccine sent to your pharmacy.      IF you received an x-ray today, you will receive an invoice from [facility] Radiology. Please contact [facility] Radiology at [phone]  with questions or concerns regarding your invoice.   IF you received labwork today, you will receive an invoice from [place]. Please contact [facility] at [phone] with questions or concerns regarding your invoice.   Our billing staff will not be able to assist you with questions regarding bills from these companies.  You will be contacted with the lab results as soon as they are available. The fastest way to get your results is to activate your My Chart account. Instructions are located on the last page of this paperwork. If you have not heard from [place] regarding the results in 2 weeks, please contact this office.      I personally performed the services described in this documentation, which was scribed in my presence. The recorded information has been reviewed and considered for accuracy and completeness, addended by me as needed, and agree with information above.  Signed,   [name], MD [facility] at [place].  09/20/17 8:42 AM

## 2017-09-21 LAB — PSA: PROSTATE SPECIFIC AG, SERUM: 1.3 ng/mL (ref 0.0–4.0)

## 2017-09-21 LAB — COMPREHENSIVE METABOLIC PANEL
A/G RATIO: 1.9 (ref 1.2–2.2)
ALBUMIN: 4.8 g/dL (ref 3.5–5.5)
ALT: 18 IU/L (ref 0–44)
AST: 40 IU/L (ref 0–40)
Alkaline Phosphatase: 67 IU/L (ref 39–117)
BILIRUBIN TOTAL: 0.4 mg/dL (ref 0.0–1.2)
BUN / CREAT RATIO: 14 (ref 9–20)
BUN: 11 mg/dL (ref 6–24)
CHLORIDE: 105 mmol/L (ref 96–106)
CO2: 20 mmol/L (ref 20–29)
Calcium: 9.5 mg/dL (ref 8.7–10.2)
Creatinine, Ser: 0.8 mg/dL (ref 0.76–1.27)
GFR calc non Af Amer: 99 mL/min/{1.73_m2} (ref >59)
GFR, EST AFRICAN AMERICAN: 115 mL/min/{1.73_m2} (ref >59)
GLOBULIN, TOTAL: 2.5 g/dL (ref 1.5–4.5)
Glucose: 110 mg/dL — ABNORMAL HIGH (ref 65–99)
POTASSIUM: 4.1 mmol/L (ref 3.5–5.2)
SODIUM: 141 mmol/L (ref 134–144)
TOTAL PROTEIN: 7.3 g/dL (ref 6.0–8.5)

## 2017-09-21 LAB — LIPID PANEL
Chol/HDL Ratio: 3.8 ratio (ref 0.0–5.0)
Cholesterol, Total: 110 mg/dL (ref 100–199)
HDL: 29 mg/dL — ABNORMAL LOW (ref >39)
LDL Calculated: 50 mg/dL (ref 0–99)
Triglycerides: 153 mg/dL — ABNORMAL HIGH (ref 0–149)
VLDL Cholesterol Cal: 31 mg/dL (ref 5–40)

## 2017-10-07 ENCOUNTER — Other Ambulatory Visit: Payer: Self-pay | Admitting: Family Medicine

## 2017-10-07 DIAGNOSIS — E785 Hyperlipidemia, unspecified: Secondary | ICD-10-CM

## 2017-10-07 DIAGNOSIS — Z794 Long term (current) use of insulin: Principal | ICD-10-CM

## 2017-10-07 DIAGNOSIS — I10 Essential (primary) hypertension: Secondary | ICD-10-CM

## 2017-10-07 DIAGNOSIS — E119 Type 2 diabetes mellitus without complications: Secondary | ICD-10-CM

## 2017-12-12 ENCOUNTER — Encounter: Payer: Self-pay | Admitting: Family Medicine

## 2018-01-06 ENCOUNTER — Other Ambulatory Visit: Payer: Self-pay | Admitting: Family Medicine

## 2018-01-06 DIAGNOSIS — E119 Type 2 diabetes mellitus without complications: Secondary | ICD-10-CM

## 2018-01-06 DIAGNOSIS — I10 Essential (primary) hypertension: Secondary | ICD-10-CM

## 2018-01-06 DIAGNOSIS — Z794 Long term (current) use of insulin: Secondary | ICD-10-CM

## 2018-01-06 DIAGNOSIS — E785 Hyperlipidemia, unspecified: Secondary | ICD-10-CM

## 2018-02-04 ENCOUNTER — Encounter: Payer: Self-pay | Admitting: Physician Assistant

## 2018-02-04 ENCOUNTER — Ambulatory Visit: Payer: Managed Care, Other (non HMO) | Admitting: Physician Assistant

## 2018-02-04 ENCOUNTER — Other Ambulatory Visit: Payer: Self-pay

## 2018-02-04 VITALS — BP 132/73 | HR 69 | Temp 98.2°F | Resp 16 | Ht 69.25 in | Wt 305.0 lb

## 2018-02-04 DIAGNOSIS — M79662 Pain in left lower leg: Secondary | ICD-10-CM | POA: Diagnosis not present

## 2018-02-04 DIAGNOSIS — M7989 Other specified soft tissue disorders: Secondary | ICD-10-CM

## 2018-02-04 DIAGNOSIS — L03116 Cellulitis of left lower limb: Secondary | ICD-10-CM

## 2018-02-04 MED ORDER — CEPHALEXIN 500 MG PO CAPS: 500.0000 mg | ORAL_CAPSULE | Freq: Three times a day (TID) | ORAL | 0 refills | Status: DC

## 2018-02-04 NOTE — Progress Notes (Signed)
Brandon Rich  MRN: [id number] DOB: 22-Apr-1961  PCP: [name], MD  Chief Complaint  Patient presents with  . Leg Injury    LEFT x 2 weeks ago with swelling and redness    Subjective:  Pt presents to clinic left shin pain swelling and erythema.  2 weeks ago he was working on a car and dropped a heavy car part on his left shin.  He thought he would get bruised but he never did.  He did start to swell the next day noticing that the swelling was worse as the day went on and resolved in the morning after waking up.  He has had a little bit of pain.  This morning he noticed that the pain was slightly worse the redness on the area where the injury happened was also a little bit worse.  He does state that the swelling might actually be a little bit better today.  He has no pain in the back of his calf.  He has no fevers or chills.  He does feel fine.  Since the injury he has been working and being active.  He has taken no medications for this.  History is obtained by patient.  Review of Systems  Cardiovascular: Positive for leg swelling.  Musculoskeletal: Negative for gait problem and joint swelling.  Skin: Positive for wound.    Patient Active Problem List   Diagnosis Date Noted  . LVH (left ventricular hypertrophy) 03/29/2016  . FUO (fever of unknown origin) 02/16/2016  . Acute combined systolic and diastolic heart failure ([place]) 02/16/2016  . Weight gain 02/16/2016  . Weight loss 02/16/2016  . Rash and nonspecific skin eruption 02/16/2016  . DOE (dyspnea on exertion) 02/16/2016  . T wave inversion in EKG 02/16/2016  . Transaminitis 02/16/2016  . HTN (hypertension) 12/20/2011  . Diabetes mellitus ([place]) 12/20/2011  . Dyslipidemia 12/20/2011    Current Outpatient Medications on File Prior to Visit  Medication Sig Dispense Refill  . Ascorbic Acid (VITAMIN C) 1000 MG tablet Take 1,000 mg by mouth daily.    Marland Kitchen aspirin 81 MG tablet Take 81 mg by mouth daily.    Marland Kitchen  atorvastatin (LIPITOR) 20 MG tablet TAKE ONE TABLET BY MOUTH DAILY 90 tablet 0  . brimonidine (ALPHAGAN) 0.2 % ophthalmic solution Place 1 drop into both eyes 2 (two) times daily.    . calcium carbonate (TUMS - DOSED IN MG ELEMENTAL CALCIUM) 500 MG chewable tablet Chew 2 tablets by mouth daily as needed for indigestion or heartburn.    . empagliflozin (JARDIANCE) 25 MG TABS tablet Take 25 mg by mouth daily. 30 tablet 3  . fexofenadine (ALLEGRA) 180 MG tablet Take 180 mg by mouth daily.    . fluticasone (FLONASE) 50 MCG/ACT nasal spray PLACE 2 SPRAYS INTO BOTH NOSTRILS DAILY. 16 g 10  . ibuprofen (ADVIL,MOTRIN) 600 MG tablet Take 600 mg by mouth every 6 (six) hours as needed.    . Insulin Glargine (BASAGLAR KWIKPEN) 100 UNIT/ML SOPN Inject into the skin.    Marland Kitchen lisinopril (PRINIVIL,ZESTRIL) 20 MG tablet TAKE ONE TABLET BY MOUTH DAILY 90 tablet 0  . metFORMIN (GLUCOPHAGE) 1000 MG tablet TAKE ONE TABLET BY MOUTH TWICE A DAY WITH A MEAL 180 tablet 0  . Multiple Vitamin (MULTIVITAMIN) tablet Take 1 tablet by mouth every evening.     . timolol (BETIMOL) 0.5 % ophthalmic solution Place 1 drop into both eyes at bedtime.      No current facility-administered medications  on file prior to visit.     No Known Allergies  Past Medical History:  Diagnosis Date  . Acute combined systolic and diastolic heart failure ([place]) 02/16/2016  . Allergy   . Diabetes mellitus   . DOE (dyspnea on exertion) 02/16/2016  . FUO (fever of unknown origin) 02/16/2016  . Glaucoma   . Heart murmur   . Hyperlipidemia   . Hypertension   . LVH (left ventricular hypertrophy) 03/29/2016  . Rash and nonspecific skin eruption 02/16/2016  . T wave inversion in EKG 02/16/2016  . Transaminitis 02/16/2016  . Weight gain 02/16/2016  . Weight loss 02/16/2016   Social History   Social History Narrative   Married. Education: [employer].    Social History   Tobacco Use  . Smoking status: Never Smoker  . Smokeless tobacco: Never Used    Substance Use Topics  . Alcohol use: No    Alcohol/week: 0.0 oz    Comment: 1 drink  . Drug use: No   family history includes Cancer in his father and mother; Heart disease in his father, maternal grandfather, maternal grandmother, mother, and paternal grandfather; Hyperlipidemia in his maternal grandmother and paternal grandfather; Hypertension in his maternal grandmother, mother, and paternal grandfather; Skin cancer in his mother.     Objective:  BP 132/73 (BP Location: Right Arm)   Pulse 69   Temp 98.2 F (36.8 C) (Oral)   Resp 16   Ht 5' 9.25" (1.759 m)   Wt (!) 305 lb (138.3 kg)   SpO2 95%   BMI 44.72 kg/m  Body mass index is 44.72 kg/m.  Wt Readings from Last 3 Encounters:  02/04/18 (!) 305 lb (138.3 kg)  09/20/17 295 lb (133.8 kg)  06/23/17 286 lb (129.7 kg)    Physical Exam  Constitutional: He is oriented to person, place, and time. He appears well-developed and well-nourished.  HENT:  Head: Normocephalic and atraumatic.  Right Ear: External ear normal.  Left Ear: External ear normal.  Eyes: Conjunctivae are normal.  Neck: Normal range of motion.  Pulmonary/Chest: Effort normal.  Musculoskeletal:  Good pedal pulses on the left.  Mild swelling of the left foot.  Some bruising on the medial aspect near the posterior heel.  Left shin with erythema and warmth and area of injury.  No broken skin.  Leg is swollen without pitting edema.  No tenderness to palpation along the calf, no palpable cord.  Neurological: He is alert and oriented to person, place, and time.  Skin: Skin is warm and dry.  Psychiatric: Judgment normal.  Vitals reviewed.   Assessment and Plan :  Cellulitis of leg, left - Plan: cephALEXin (KEFLEX) 500 MG capsule  Pain and swelling of left lower leg   Patient presentation is consistent with cellulitis.  He has no tenderness posterior calf to suggest a DVT.  He will elevate and heat the area of the injury as well as use compression socks to help  with the swelling.  Within 48 hours he should be improving.  He was given warning signs of if consistently worse tomorrow to the emergency department if not improved by Monday to return to clinic here here and if that happens he will likely need it ultrasound of his leg.  Patient verbalized to me that they understand the following: diagnosis, what is being done for them, what to expect and what should be done at home.  Their questions have been answered.  See after visit summary for patient specific instructions.  Windell Hummingbird PA-C  [facility] 02/04/2018 4:21 PM  Please note: Portions of this report may have been transcribed using dragon voice recognition software. Every effort was made to ensure accuracy; however, inadvertent computerized transcription errors may be present.

## 2018-02-04 NOTE — Patient Instructions (Addendum)
Elevation of leg - ankle to level of heart Compression and heat is possible  In 48h if you are not improved in regards to heat and redness you need to be seen again -- swelling may still be present esp after work - swelling will get worse as the day goes on due to gravity    IF you received an x-ray today, you will receive an invoice from [facility] Radiology. Please contact [facility] Radiology at [phone] with questions or concerns regarding your invoice.   IF you received labwork today, you will receive an invoice from [place]. Please contact [facility] at [phone] with questions or concerns regarding your invoice.   Our billing staff will not be able to assist you with questions regarding bills from these companies.  You will be contacted with the lab results as soon as they are available. The fastest way to get your results is to activate your My Chart account. Instructions are located on the last page of this paperwork. If you have not heard from [place] regarding the results in 2 weeks, please contact this office.

## 2018-02-15 ENCOUNTER — Ambulatory Visit: Payer: Managed Care, Other (non HMO) | Admitting: Physician Assistant

## 2018-02-15 ENCOUNTER — Other Ambulatory Visit: Payer: Self-pay

## 2018-02-15 ENCOUNTER — Encounter: Payer: Self-pay | Admitting: Physician Assistant

## 2018-02-15 VITALS — BP 132/74 | HR 92 | Temp 97.7°F | Ht 63.0 in | Wt 303.4 lb

## 2018-02-15 DIAGNOSIS — M7989 Other specified soft tissue disorders: Secondary | ICD-10-CM

## 2018-02-15 DIAGNOSIS — M79662 Pain in left lower leg: Secondary | ICD-10-CM

## 2018-02-15 DIAGNOSIS — L03116 Cellulitis of left lower limb: Secondary | ICD-10-CM | POA: Diagnosis not present

## 2018-02-15 DIAGNOSIS — M6530 Trigger finger, unspecified finger: Secondary | ICD-10-CM | POA: Insufficient documentation

## 2018-02-15 DIAGNOSIS — R21 Rash and other nonspecific skin eruption: Secondary | ICD-10-CM

## 2018-02-15 DIAGNOSIS — M79641 Pain in right hand: Secondary | ICD-10-CM | POA: Insufficient documentation

## 2018-02-15 NOTE — Progress Notes (Signed)
Brandon Rich  MRN: [id number] DOB: 11/02/1960  PCP: [name], MD  Chief Complaint  Patient presents with  . Follow-up    for left leg cellulitis, still having occasional pain    Subjective:  Pt presents to clinic for recheck of his leg.  The erythema has resolved after finishing his antibiotics.  The pain has resolved.  He still has a bruise a lesion which is softer where the tool hit his leg.  He is having no pain.  His swelling is much better but is still present as the day goes on it gets worse but is almost resolved in the morning.  After his visit 10 days ago he did well with elevating he has not done well over the last several days.  He has these red dots on his lower leg which she has also gotten before and has some on his right leg also.  Some of them are in a linear pattern which is different to.  He has no itching.    History is obtained by patient.  Review of Systems  Constitutional: Negative for chills and fever.  Skin: Positive for rash and wound.    Patient Active Problem List   Diagnosis Date Noted  . LVH (left ventricular hypertrophy) 03/29/2016  . FUO (fever of unknown origin) 02/16/2016  . Acute combined systolic and diastolic heart failure ([place]) 02/16/2016  . Weight gain 02/16/2016  . Weight loss 02/16/2016  . Rash and nonspecific skin eruption 02/16/2016  . DOE (dyspnea on exertion) 02/16/2016  . T wave inversion in EKG 02/16/2016  . Transaminitis 02/16/2016  . HTN (hypertension) 12/20/2011  . Diabetes mellitus ([place]) 12/20/2011  . Dyslipidemia 12/20/2011    Current Outpatient Medications on File Prior to Visit  Medication Sig Dispense Refill  . Ascorbic Acid (VITAMIN C) 1000 MG tablet Take 1,000 mg by mouth daily.    Marland Kitchen aspirin 81 MG tablet Take 81 mg by mouth daily.    Marland Kitchen atorvastatin (LIPITOR) 20 MG tablet TAKE ONE TABLET BY MOUTH DAILY 90 tablet 0  . brimonidine (ALPHAGAN) 0.2 % ophthalmic solution Place 1 drop into both eyes 2 (two)  times daily.    . calcium carbonate (TUMS - DOSED IN MG ELEMENTAL CALCIUM) 500 MG chewable tablet Chew 2 tablets by mouth daily as needed for indigestion or heartburn.    . empagliflozin (JARDIANCE) 25 MG TABS tablet Take 25 mg by mouth daily. 30 tablet 3  . fexofenadine (ALLEGRA) 180 MG tablet Take 180 mg by mouth daily.    . fluticasone (FLONASE) 50 MCG/ACT nasal spray PLACE 2 SPRAYS INTO BOTH NOSTRILS DAILY. 16 g 10  . ibuprofen (ADVIL,MOTRIN) 600 MG tablet Take 600 mg by mouth every 6 (six) hours as needed.    . Insulin Glargine (BASAGLAR KWIKPEN) 100 UNIT/ML SOPN Inject into the skin.    Marland Kitchen lisinopril (PRINIVIL,ZESTRIL) 20 MG tablet TAKE ONE TABLET BY MOUTH DAILY 90 tablet 0  . metFORMIN (GLUCOPHAGE) 1000 MG tablet TAKE ONE TABLET BY MOUTH TWICE A DAY WITH A MEAL 180 tablet 0  . Multiple Vitamin (MULTIVITAMIN) tablet Take 1 tablet by mouth every evening.     . timolol (BETIMOL) 0.5 % ophthalmic solution Place 1 drop into both eyes at bedtime.      No current facility-administered medications on file prior to visit.     No Known Allergies  Past Medical History:  Diagnosis Date  . Acute combined systolic and diastolic heart failure ([place]) 02/16/2016  .  Allergy   . Diabetes mellitus   . DOE (dyspnea on exertion) 02/16/2016  . FUO (fever of unknown origin) 02/16/2016  . Glaucoma   . Heart murmur   . Hyperlipidemia   . Hypertension   . LVH (left ventricular hypertrophy) 03/29/2016  . Rash and nonspecific skin eruption 02/16/2016  . T wave inversion in EKG 02/16/2016  . Transaminitis 02/16/2016  . Weight gain 02/16/2016  . Weight loss 02/16/2016   Social History   Social History Narrative   Married. Education: [employer].    Social History   Tobacco Use  . Smoking status: Never Smoker  . Smokeless tobacco: Never Used  Substance Use Topics  . Alcohol use: No    Alcohol/week: 0.0 oz    Comment: 1 drink  . Drug use: No   family history includes Cancer in his father and mother; Heart  disease in his father, maternal grandfather, maternal grandmother, mother, and paternal grandfather; Hyperlipidemia in his maternal grandmother and paternal grandfather; Hypertension in his maternal grandmother, mother, and paternal grandfather; Skin cancer in his mother.     Objective:  BP 132/74 (BP Location: Left Arm, Patient Position: Sitting, Cuff Size: Large)   Pulse 92   Temp 97.7 F (36.5 C) (Oral)   Ht 5\' 3"  (1.6 m)   Wt (!) 303 lb 6.4 oz (137.6 kg)   HC 9" (22.9 cm)   SpO2 97%   BMI 53.74 kg/m  Body mass index is 53.74 kg/m.  Wt Readings from Last 3 Encounters:  02/15/18 (!) 303 lb 6.4 oz (137.6 kg)  02/04/18 (!) 305 lb (138.3 kg)  09/20/17 295 lb (133.8 kg)    Physical Exam  Constitutional: He is oriented to person, place, and time. He appears well-developed and well-nourished.  HENT:  Head: Normocephalic and atraumatic.  Right Ear: External ear normal.  Left Ear: External ear normal.  Eyes: Conjunctivae are normal.  Neck: Normal range of motion.  Pulmonary/Chest: Effort normal.  Neurological: He is alert and oriented to person, place, and time.  Skin: Skin is warm and dry.  Bilateral petechiae at the ankles.  On left lower leg some linear petechiae.  No blanching.  No associated erythema to indicate infection.  Area where tool hit hematoma still present but softer.  No surrounding erythema and no warmth in that area.  1+ pitting edema on the left lower half of shin.  No posterior tenderness to palpation of calf, no cords palpable.  Psychiatric: He has a normal mood and affect. His behavior is normal. Judgment and thought content normal.  Vitals reviewed.   Assessment and Plan :  Cellulitis of leg, left resolved patient to continue to monitor due to hematoma still present if he notices any return of cellulitis with erythema or warmth he will contact me for repeat antibiotic curse.  Pain and swelling of left lower leg -pain has resolved swelling is improved though  not resolved.  Encouraged continued elevation encouraged compression with compression sock.  Rash/petechial -likely related to swelling but patient also has this on other leg could be early representation of peripheral vascular disease.  Patient to continue to monitor to make sure no superficial cellulitis occurs.  Patient verbalized to me that they understand the following: diagnosis, what is being done for them, what to expect and what should be done at home.  Their questions have been answered.  See after visit summary for patient specific instructions.  Windell Hummingbird PA-C  [facility]  02/15/2018 2:49 PM  Please note: Portions of this report may have been transcribed using dragon voice recognition software. Every effort was made to ensure accuracy; however, inadvertent computerized transcription errors may be present.

## 2018-02-15 NOTE — Patient Instructions (Addendum)
go2socks  CEP  Compression 25-35mmHG  Swell - no good    IF you received an x-ray today, you will receive an invoice from [facility]. Please contact [facility] at [phone] with questions or concerns regarding your invoice.   IF you received labwork today, you will receive an invoice from [place]. Please contact [facility] at [phone] with questions or concerns regarding your invoice.   Our billing staff will not be able to assist you with questions regarding bills from these companies.  You will be contacted with the lab results as soon as they are available. The fastest way to get your results is to activate your My Chart account. Instructions are located on the last page of this paperwork. If you have not heard from [place] regarding the results in 2 weeks, please contact this office.

## 2018-04-10 ENCOUNTER — Ambulatory Visit (INDEPENDENT_AMBULATORY_CARE_PROVIDER_SITE_OTHER): Payer: Managed Care, Other (non HMO) | Admitting: Family Medicine

## 2018-04-10 DIAGNOSIS — Z23 Encounter for immunization: Secondary | ICD-10-CM | POA: Diagnosis not present

## 2018-04-10 NOTE — Addendum Note (Signed)
Addended by: Kittie Plater, Ladrea Holladay HUA on: 04/10/2018 03:38 PM   Modules accepted: Level of Service

## 2018-04-10 NOTE — Progress Notes (Signed)
Flu shot given

## 2018-04-15 ENCOUNTER — Other Ambulatory Visit: Payer: Self-pay | Admitting: Family Medicine

## 2018-04-15 DIAGNOSIS — E785 Hyperlipidemia, unspecified: Secondary | ICD-10-CM

## 2018-04-15 DIAGNOSIS — Z794 Long term (current) use of insulin: Secondary | ICD-10-CM

## 2018-04-15 DIAGNOSIS — I10 Essential (primary) hypertension: Secondary | ICD-10-CM

## 2018-04-15 DIAGNOSIS — E119 Type 2 diabetes mellitus without complications: Secondary | ICD-10-CM

## 2018-05-12 ENCOUNTER — Other Ambulatory Visit: Payer: Self-pay

## 2018-05-12 ENCOUNTER — Ambulatory Visit: Payer: Managed Care, Other (non HMO) | Admitting: Family Medicine

## 2018-05-12 ENCOUNTER — Encounter: Payer: Self-pay | Admitting: Family Medicine

## 2018-05-12 ENCOUNTER — Ambulatory Visit (INDEPENDENT_AMBULATORY_CARE_PROVIDER_SITE_OTHER): Payer: Managed Care, Other (non HMO)

## 2018-05-12 VITALS — BP 136/83 | HR 81 | Temp 98.6°F | Resp 16 | Ht 68.0 in | Wt 304.6 lb

## 2018-05-12 DIAGNOSIS — S91301A Unspecified open wound, right foot, initial encounter: Secondary | ICD-10-CM

## 2018-05-12 DIAGNOSIS — L03031 Cellulitis of right toe: Secondary | ICD-10-CM

## 2018-05-12 DIAGNOSIS — L97519 Non-pressure chronic ulcer of other part of right foot with unspecified severity: Secondary | ICD-10-CM | POA: Diagnosis not present

## 2018-05-12 DIAGNOSIS — E11621 Type 2 diabetes mellitus with foot ulcer: Secondary | ICD-10-CM | POA: Diagnosis not present

## 2018-05-12 LAB — POCT CBC
Granulocyte percent: 60.9 %G (ref 37–80)
HEMATOCRIT: 45.8 % — AB (ref 29–41)
Hemoglobin: 15.6 g/dL — AB (ref 9.5–13.5)
LYMPH, POC: 3.2 (ref 0.6–3.4)
MCH, POC: 28.9 pg (ref 27–31.2)
MCHC: 34.1 g/dL (ref 31.8–35.4)
MCV: 85 fL (ref 76–111)
MID (cbc): 0.8 (ref 0–0.9)
MPV: 6.2 fL (ref 0–99.8)
POC GRANULOCYTE: 6.3 (ref 2–6.9)
POC LYMPH %: 31.5 % (ref 10–50)
POC MID %: 7.6 %M (ref 0–12)
Platelet Count, POC: 332 10*3/uL (ref 142–424)
RBC: 5.39 M/uL (ref 4.69–6.13)
RDW, POC: 14.2 %
WBC: 10.3 10*3/uL — AB (ref 4.6–10.2)

## 2018-05-12 MED ORDER — AMOXICILLIN-POT CLAVULANATE 875-125 MG PO TABS: 1.0000 | ORAL_TABLET | Freq: Two times a day (BID) | ORAL | 0 refills | Status: DC

## 2018-05-12 NOTE — Progress Notes (Addendum)
Subjective:  By signing my name below, I, [name], attest that this documentation has been prepared under the direction and in the presence of [name], MD Electronically Signed: Ladene Artist, ED Scribe 05/12/2018 at 12:58 PM.   Patient ID: Brandon Rich, Rich    DOB: 1961-03-26, 57 y.o.   MRN: [id number]  Chief Complaint  Patient presents with  . Toe    right great toe, " had a blister from a new pair of shoes that have just not healed, to me it looks infected. sometimes looks like it's going to heal then it will crack open and bleed. x 5-6 months   HPI Brandon Rich "Brandon Rich" is a 57 y.o. Rich who presents to Primary Care at [facility] complaining of a wound to the R great toe with difficulty healing. H/o DM, managed by Dr. Buddy Duty. A1C of 8.8 in July. Started on ozempic, continued on basaglar. Started on jardiance.  Pt states that wound on the R great toe started as a blister from a pair of steel toe boots 5-6 months ago. He noticed a crack in the skin that comes and goes, redness 1 wk ago, bleeding and clear drainage with odor 1 wk ago and increased swelling to the toe over the past month. He has been soaking his feet daily and applying bandages. Denies fever or any other symptoms.  Patient Active Problem List   Diagnosis Date Noted  . LVH (left ventricular hypertrophy) 03/29/2016  . FUO (fever of unknown origin) 02/16/2016  . Acute combined systolic and diastolic heart failure ([place]) 02/16/2016  . Weight gain 02/16/2016  . Weight loss 02/16/2016  . Rash and nonspecific skin eruption 02/16/2016  . DOE (dyspnea on exertion) 02/16/2016  . T wave inversion in EKG 02/16/2016  . Transaminitis 02/16/2016  . HTN (hypertension) 12/20/2011  . Diabetes mellitus ([place]) 12/20/2011  . Dyslipidemia 12/20/2011   Past Medical History:  Diagnosis Date  . Acute combined systolic and diastolic heart failure ([place]) 02/16/2016  . Allergy   . Diabetes mellitus   . DOE (dyspnea on  exertion) 02/16/2016  . FUO (fever of unknown origin) 02/16/2016  . Glaucoma   . Heart murmur   . Hyperlipidemia   . Hypertension   . LVH (left ventricular hypertrophy) 03/29/2016  . Rash and nonspecific skin eruption 02/16/2016  . T wave inversion in EKG 02/16/2016  . Transaminitis 02/16/2016  . Weight gain 02/16/2016  . Weight loss 02/16/2016   Past Surgical History:  Procedure Laterality Date  . HERNIA REPAIR    . SPINE SURGERY    . TUMOR REMOVAL     No Known Allergies Prior to Admission medications   Medication Sig Start Date End Date Taking? Authorizing Provider  Ascorbic Acid (VITAMIN C) 1000 MG tablet Take 1,000 mg by mouth daily.    [provider]  aspirin 81 MG tablet Take 81 mg by mouth daily.    [provider]  atorvastatin (LIPITOR) 20 MG tablet TAKE ONE TABLET BY MOUTH DAILY 04/15/18   [name], MD  brimonidine (ALPHAGAN) 0.2 % ophthalmic solution Place 1 drop into both eyes 2 (two) times daily.    [provider]  calcium carbonate (TUMS - DOSED IN MG ELEMENTAL CALCIUM) 500 MG chewable tablet Chew 2 tablets by mouth daily as needed for indigestion or heartburn.    [provider]  empagliflozin (JARDIANCE) 25 MG TABS tablet Take 25 mg by mouth daily. 12/16/16   [name],  MD  fexofenadine (ALLEGRA) 180 MG tablet Take 180 mg by mouth daily.    [provider]  fluticasone (FLONASE) 50 MCG/ACT nasal spray PLACE 2 SPRAYS INTO BOTH NOSTRILS DAILY. 07/09/14   Orma Flaming, MD  ibuprofen (ADVIL,MOTRIN) 600 MG tablet Take 600 mg by mouth every 6 (six) hours as needed.    [provider]  Insulin Glargine (BASAGLAR KWIKPEN) 100 UNIT/ML SOPN Inject into the skin. 03/07/17   [provider]  lisinopril (PRINIVIL,ZESTRIL) 20 MG tablet TAKE ONE TABLET BY MOUTH DAILY 04/15/18   [name], MD  metFORMIN (GLUCOPHAGE) 1000 MG tablet TAKE ONE TABLET BY MOUTH TWICE A DAY WITH A MEAL 04/15/18   [name], MD  Multiple Vitamin (MULTIVITAMIN) tablet Take 1 tablet by mouth every evening.     [provider]  timolol (BETIMOL) 0.5 % ophthalmic solution Place 1 drop into both eyes at bedtime.     [provider]   Social History   Socioeconomic History  . Marital status: Married    Spouse name: Not on file  . Number of children: 2  . Years of education: 46  . Highest education level: Not on file  Occupational History  . Occupation: Tour manager  Social Needs  . Financial resource strain: Not on file  . Food insecurity:    Worry: Not on file    Inability: Not on file  . Transportation needs:    Medical: Not on file    Non-medical: Not on file  Tobacco Use  . Smoking status: Never Smoker  . Smokeless tobacco: Never Used  Substance and Sexual Activity  . Alcohol use: No    Alcohol/week: 0.0 standard drinks    Comment: 1 drink  . Drug use: No  . Sexual activity: Yes    Partners: Female  Lifestyle  . Physical activity:    Days per week: Not on file    Minutes per session: Not on file  . Stress: Not on file  Relationships  . Social connections:    Talks on phone: Not on file    Gets together: Not on file    Attends religious service: Not on file    Active member of club or organization: Not on file    Attends meetings of clubs or organizations: Not on file    Relationship status: Not on file  . Intimate partner violence:    Fear of current or ex partner: Not on file    Emotionally abused: Not on file    Physically abused: Not on file    Forced sexual activity: Not on file  Other Topics Concern  . Not on file  Social History Narrative   Married. Education: [employer].    Review of Systems  Constitutional: Negative for fever.  Musculoskeletal: Positive for joint swelling.  Skin: Positive for color change and wound.      Objective:   Physical Exam  Constitutional: He is oriented to person, place, and time. He appears well-developed and  well-nourished. No distress.  HENT:  Head: Normocephalic and atraumatic.  Eyes: Conjunctivae and EOM are normal.  Neck: Neck supple. No tracheal deviation present.  Cardiovascular: Normal rate.  Pulmonary/Chest: Effort normal. No respiratory distress.  Musculoskeletal: Normal range of motion.  No bony tenderness,  Neurological: He is alert and oriented to person, place, and time.  Skin: Skin is warm and dry.  R great toe: erythema with soft tissue swelling extending to the base of great toe  only. Wound of medial great toe at IP area 1/5cm ulceration with with split of skin proximally. Thickened overlying skin 3x3cm. Granulation tissue centrally. Clear discharge from base of wound with odor. Culture obtained from central aspect of wound.  Psychiatric: He has a normal mood and affect. His behavior is normal.  Nursing note and vitals reviewed.  Vitals:   05/12/18 1237  BP: 136/83  Pulse: 81  Resp: 16  Temp: 98.6 F (37 C)  TempSrc: Oral  SpO2: 97%  Weight: (!) 304 lb 9.6 oz (138.2 kg)  Height: 5\' 8"  (1.727 m)   Results for orders placed or performed in visit on 05/12/18  POCT CBC  Result Value Ref Range   WBC 10.3 (A) 4.6 - 10.2 K/uL   Lymph, poc 3.2 0.6 - 3.4   POC LYMPH PERCENT 31.5 10 - 50 %L   MID (cbc) 0.8 0 - 0.9   POC MID % 7.6 0 - 12 %M   POC Granulocyte 6.3 2 - 6.9   Granulocyte percent 60.9 37 - 80 %G   RBC 5.39 4.69 - 6.13 M/uL   Hemoglobin 15.6 (A) 9.5 - 13.5 g/dL   HCT, POC 45.8 (A) 29 - 41 %   MCV 85.0 76 - 111 fL   MCH, POC 28.9 27 - 31.2 pg   MCHC 34.1 31.8 - 35.4 g/dL   RDW, POC 14.2 %   Platelet Count, POC 332 142 - 424 K/uL   MPV 6.2 0 - 99.8 fL   Dg Toe Great Right  Result Date: 05/12/2018 CLINICAL DATA:  Nonhealing wound/ulcer medial great toe. Evaluate for osteomyelitis/foreign body. EXAM: RIGHT GREAT TOE COMPARISON:  06/25/2013 FINDINGS: There are degenerative changes of the first MTP joint and first interphalangeal joint without significant  change. Soft tissue defect over the medial plantar soft tissues at the level of the first distal phalanx. No evidence of underlying bone destruction to suggest osteomyelitis. No foreign body. IMPRESSION: Soft tissue changes over the plantar medial aspect adjacent the first interphalangeal joint compatible with known ulceration. No evidence of osteomyelitis. Stable degenerative changes of the first MTP joint and interphalangeal joints. Electronically Signed   By: Marin Olp M.D.   On: 05/12/2018 13:57       Assessment & Plan:   CATO LIBURD is a 57 y.o. Rich Open wound of right foot with complication, initial encounter - Plan: POCT CBC, DG Toe Great Right, WOUND CULTURE, Ambulatory referral to Podiatry, amoxicillin-clavulanate (AUGMENTIN) 875-125 MG tablet  Diabetic ulcer of toe of right foot associated with type 2 diabetes mellitus, unspecified ulcer stage ([place]) - Plan: Ambulatory referral to Podiatry, amoxicillin-clavulanate (AUGMENTIN) 875-125 MG tablet  Cellulitis of toe of right foot - Plan: Ambulatory referral to Podiatry, amoxicillin-clavulanate (AUGMENTIN) 875-125 MG tablet  Suspected nonhealing ulcer of right medial great toe now with some signs of secondary cellulitis.   - X-ray without apparent findings of osteomyelitis, but could consider MRI for further evaluation.  -Borderline leukocytosis, but afebrile, slight erythema around ulcer.  Start Augmentin for cellulitis, potential side effects/risk of meds discussed  -We will have followed up acutely tomorrow with podiatry to decide on further treatment/imaging.  Overnight ER precautions given.  Meds ordered this encounter  Medications  . amoxicillin-clavulanate (AUGMENTIN) 875-125 MG tablet    Sig: Take 1 tablet by mouth 2 (two) times daily.    Dispense:  20 tablet    Refill:  0   Patient Instructions   Start antibiotic twice per day, keep wound  clean and covered and follow up with podiatrist tomorrow at 11:30am. I did  check a culture of the wound today to decide if different antibiotic needed.   Return to the clinic or go to the nearest emergency room if any of your symptoms worsen or new symptoms occur.    If you have lab work done today you will be contacted with your lab results within the next 2 weeks.  If you have not heard from [place] then please contact us. The fastest way to get your results is to register for My Chart.   IF you received an x-ray today, you will receive an invoice from [facility]. Please contact [facility] at [phone] with questions or concerns regarding your invoice.   IF you received labwork today, you will receive an invoice from [place]. Please contact [facility] at [phone] with questions or concerns regarding your invoice.   Our billing staff will not be able to assist you with questions regarding bills from these companies.  You will be contacted with the lab results as soon as they are available. The fastest way to get your results is to activate your My Chart account. Instructions are located on the last page of this paperwork. If you have not heard from [place] regarding the results in 2 weeks, please contact this office.      I personally performed the services described in this documentation, which was scribed in my presence. The recorded information has been reviewed and considered for accuracy and completeness, addended by me as needed, and agree with information above.  Signed,   [name], MD [facility] [place].  05/12/18 10:40 PM

## 2018-05-12 NOTE — Patient Instructions (Addendum)
Start antibiotic twice per day, keep wound clean and covered and follow up with podiatrist tomorrow at 11:30am. I did check a culture of the wound today to decide if different antibiotic needed.   Return to the clinic or go to the nearest emergency room if any of your symptoms worsen or new symptoms occur.    If you have lab work done today you will be contacted with your lab results within the next 2 weeks.  If you have not heard from [place] then please contact us. The fastest way to get your results is to register for My Chart.   IF you received an x-ray today, you will receive an invoice from [facility]. Please contact [facility] at [phone] with questions or concerns regarding your invoice.   IF you received labwork today, you will receive an invoice from [place]. Please contact [facility] at [phone] with questions or concerns regarding your invoice.   Our billing staff will not be able to assist you with questions regarding bills from these companies.  You will be contacted with the lab results as soon as they are available. The fastest way to get your results is to activate your My Chart account. Instructions are located on the last page of this paperwork. If you have not heard from [place] regarding the results in 2 weeks, please contact this office.

## 2018-05-13 ENCOUNTER — Ambulatory Visit (INDEPENDENT_AMBULATORY_CARE_PROVIDER_SITE_OTHER): Payer: Managed Care, Other (non HMO) | Admitting: Podiatry

## 2018-05-13 ENCOUNTER — Encounter: Payer: Self-pay | Admitting: Podiatry

## 2018-05-13 ENCOUNTER — Other Ambulatory Visit (HOSPITAL_COMMUNITY)
Admission: RE | Admit: 2018-05-13 | Discharge: 2018-05-13 | Disposition: A | Discharge status: Home / Self Care | Payer: Managed Care, Other (non HMO) | Source: Ambulatory Visit | Attending: Podiatry | Admitting: Podiatry

## 2018-05-13 VITALS — BP 140/77 | HR 87

## 2018-05-13 DIAGNOSIS — M86171 Other acute osteomyelitis, right ankle and foot: Secondary | ICD-10-CM

## 2018-05-13 DIAGNOSIS — L97511 Non-pressure chronic ulcer of other part of right foot limited to breakdown of skin: Secondary | ICD-10-CM

## 2018-05-13 DIAGNOSIS — R600 Localized edema: Secondary | ICD-10-CM

## 2018-05-13 DIAGNOSIS — E1142 Type 2 diabetes mellitus with diabetic polyneuropathy: Secondary | ICD-10-CM | POA: Diagnosis not present

## 2018-05-13 DIAGNOSIS — L02611 Cutaneous abscess of right foot: Secondary | ICD-10-CM

## 2018-05-13 DIAGNOSIS — L03031 Cellulitis of right toe: Secondary | ICD-10-CM

## 2018-05-13 LAB — SEDIMENTATION RATE: Sed Rate: 7 mm/hr (ref 0–16)

## 2018-05-13 LAB — C-REACTIVE PROTEIN: CRP: <0.8 mg/dL (ref <1.0)

## 2018-05-13 MED ORDER — SILVER SULFADIAZINE 1 % EX CREA: TOPICAL_CREAM | CUTANEOUS | 0 refills | Status: DC

## 2018-05-14 ENCOUNTER — Encounter: Payer: Self-pay | Admitting: Podiatry

## 2018-05-14 NOTE — Progress Notes (Addendum)
Subjective:  Patient ID: Brandon Rich, male    DOB: [birth year],  MRN: [id number]  Chief Complaint  Patient presents with  . Diabetic Ulcer    right great toe    57 y.o. male presents for wound care.  Reports diabetic ulcer to the right great toe.  States this he had x-rays yesterday.  Not sure when the ulcer started thinks that it started within the past week.  Denies nausea vomiting fever chills.  Endorses numbness to the foot.  Last A1c of 8.2.  PCP Dr. Buddy Duty.  Review of Systems: Negative except as noted in the HPI. Denies N/V/F/Ch.  Past Medical History:  Diagnosis Date  . Acute combined systolic and diastolic heart failure ([place]) 02/16/2016  . Allergy   . Diabetes mellitus   . DOE (dyspnea on exertion) 02/16/2016  . FUO (fever of unknown origin) 02/16/2016  . Glaucoma   . Heart murmur   . Hyperlipidemia   . Hypertension   . LVH (left ventricular hypertrophy) 03/29/2016  . Rash and nonspecific skin eruption 02/16/2016  . T wave inversion in EKG 02/16/2016  . Transaminitis 02/16/2016  . Weight gain 02/16/2016  . Weight loss 02/16/2016    Current Outpatient Medications:  .  amoxicillin-clavulanate (AUGMENTIN) 875-125 MG tablet, Take 1 tablet by mouth 2 (two) times daily., Disp: 20 tablet, Rfl: 0 .  Ascorbic Acid (VITAMIN C) 1000 MG tablet, Take 1,000 mg by mouth daily., Disp: , Rfl:  .  aspirin 81 MG tablet, Take 81 mg by mouth daily., Disp: , Rfl:  .  Aspirin-Calcium Carbonate 81-777 MG TABS, Take by mouth., Disp: , Rfl:  .  atorvastatin (LIPITOR) 20 MG tablet, TAKE ONE TABLET BY MOUTH DAILY, Disp: 30 tablet, Rfl: 0 .  brimonidine (ALPHAGAN) 0.2 % ophthalmic solution, Place 1 drop into both eyes 2 (two) times daily., Disp: , Rfl:  .  calcium carbonate (OS-CAL) 1250 (500 Ca) MG chewable tablet, Chew by mouth., Disp: , Rfl:  .  calcium carbonate (TUMS - DOSED IN MG ELEMENTAL CALCIUM) 500 MG chewable tablet, Chew 2 tablets by mouth daily as needed for indigestion or heartburn.,  Disp: , Rfl:  .  canagliflozin (INVOKANA) 300 MG TABS tablet, , Disp: , Rfl:  .  cephALEXin (KEFLEX) 500 MG capsule, cephalexin 500 mg capsule, Disp: , Rfl:  .  Cholecalciferol (VITAMIN D-1000 MAX ST) 1000 units tablet, Take by mouth., Disp: , Rfl:  .  ciprofloxacin (CIPRO) 500 MG tablet, ciprofloxacin 500 mg tablet, Disp: , Rfl:  .  doxycycline (VIBRAMYCIN) 100 MG capsule, doxycycline hyclate 100 mg capsule, Disp: , Rfl:  .  Dulaglutide (TRULICITY) 1.5 GG/8.3MO SOPN, Trulicity 1.5 QH/4.7 mL subcutaneous pen injector, Disp: , Rfl:  .  empagliflozin (JARDIANCE) 25 MG TABS tablet, Take 25 mg by mouth daily., Disp: 30 tablet, Rfl: 3 .  fexofenadine (ALLEGRA) 180 MG tablet, Take 180 mg by mouth daily., Disp: , Rfl:  .  fluticasone (FLONASE) 50 MCG/ACT nasal spray, PLACE 2 SPRAYS INTO BOTH NOSTRILS DAILY., Disp: 16 g, Rfl: 10 .  ibuprofen (ADVIL,MOTRIN) 600 MG tablet, Take 600 mg by mouth every 6 (six) hours as needed., Disp: , Rfl:  .  Insulin Glargine (BASAGLAR KWIKPEN) 100 UNIT/ML SOPN, Inject into the skin., Disp: , Rfl:  .  liraglutide (VICTOZA) 18 MG/3ML SOPN, , Disp: , Rfl:  .  lisinopril (PRINIVIL,ZESTRIL) 20 MG tablet, TAKE ONE TABLET BY MOUTH DAILY, Disp: 30 tablet, Rfl: 0 .  metFORMIN (GLUCOPHAGE) 1000 MG tablet, TAKE ONE TABLET  BY MOUTH TWICE A DAY WITH A MEAL, Disp: 60 tablet, Rfl: 0 .  metroNIDAZOLE (METROGEL) 0.75 % gel, , Disp: , Rfl:  .  Multiple Vitamin (MULTIVITAMIN) tablet, Take 1 tablet by mouth every evening. , Disp: , Rfl:  .  Semaglutide,0.25 or 0.5MG/DOS, (OZEMPIC, 0.25 OR 0.5 MG/DOSE,) 2 MG/1.5ML SOPN, Inject into the skin., Disp: , Rfl:  .  silver sulfADIAZINE (SILVADENE) 1 % cream, Apply pea-sized amount to wound daily., Disp: 50 g, Rfl: 0 .  simvastatin (ZOCOR) 20 MG tablet, , Disp: , Rfl:  .  timolol (BETIMOL) 0.5 % ophthalmic solution, Place 1 drop into both eyes at bedtime. , Disp: , Rfl:  .  timolol (TIMOPTIC-XR) 0.5 % ophthalmic gel-forming, Apply to eye., Disp: ,  Rfl:   Social History   Tobacco Use  Smoking Status Never Smoker  Smokeless Tobacco Never Used    No Known Allergies Objective:   Vitals:   05/13/18 1205  BP: 140/77  Pulse: 87   There is no height or weight on file to calculate BMI. Constitutional Well developed. Well nourished.  Vascular Dorsalis pedis pulses palpable bilaterally. Posterior tibial pulses palpable bilaterally. Capillary refill normal to all digits.  No cyanosis or clubbing noted. Pedal hair growth normal.  Neurologic Normal speech. Oriented to person, place, and time. Protective sensation absent  Dermatologic Wound Location: R hallux Wound Base: Mixed Granular/Fibrotic Peri-wound: Reddened, Macerated Exudate: Scant/small amount Serosanguinous exudate Right hallux medial ulceration at the IPJ measuring one by one post debridement without probe to bone.  Local warmth and erythema noted not extending proximal to the MPJ.  No soft tissue crepitus or fluctuance.  No ascending cellulitis.  Orthopedic: No pain to palpation either foot.   Radiographs: Prior x-rays reviewed.  Hallux IPJ arthritic changes consistent with prior x-rays.  No worsening bone infection.  Prior labs reviewed. Assessment:   1. Acute osteomyelitis of right ankle or foot ([place])   2. Skin ulcer of toe of right foot, limited to breakdown of skin ([facility])   3. Cellulitis and abscess of toe, right   4. DM type 2 with diabetic peripheral neuropathy ([place])   5. Edema of toe    Plan:  Patient was evaluated and treated and all questions answered.  Ulcer R great toe  -Debridement as below. -Dressed with silvadene, DSD. -Rx for silvadene to be applied daily. -Surgical boot dispensed. -WBC reviewed only midly elevated. -CRP and ESR ordered. Should it be positive would consider MRI. -Discussed with patient severity of infection and should the bone be infected he would likely benefit from amputation.  At this point the toe is edematous and white  blood cell mildly elevated I am unsure if the bone is infected as x-rays do not show signs of infection.  We will follow-up CRP and ESR and order MRI if indicated. -Wound culture pending -Cellulitis outlined.  Advised to present promptly to the ER should cellulitis worsen. -Patient is traveling out of town this week advised that should the ulcer worsen he needs to return for evaluation of her present promptly to the ER.  Procedure: Excisional Debridement of Wound Rationale: Removal of non-viable soft tissue from the wound to promote healing.  Anesthesia: none Pre-Debridement Wound Measurements: overlying hyperkeratosis  Post-Debridement Wound Measurements: 1 cm x 1 cm x 0.2 cm  Type of Debridement: Sharp Excisional Tissue Removed: Non-viable soft tissue Depth of Debridement: subcutaneous tissue. Technique: Sharp excisional debridement to bleeding, viable wound base.  Dressing: Dry, sterile, compression dressing. Disposition: Patient  tolerated procedure well. Patient to return in 1 week for follow-up.  Return in about 1 week (around 05/20/2018) for Wound Care.  Will have Dr. Jacqualyn Posey evaluate wound this week.  Addendum: ESR and CRP came back normal. Patient notified through [facility].

## 2018-05-16 LAB — WOUND CULTURE

## 2018-05-18 ENCOUNTER — Encounter: Payer: Self-pay | Admitting: Family Medicine

## 2018-05-19 ENCOUNTER — Ambulatory Visit (INDEPENDENT_AMBULATORY_CARE_PROVIDER_SITE_OTHER): Payer: Managed Care, Other (non HMO) | Admitting: Podiatry

## 2018-05-19 DIAGNOSIS — S91301A Unspecified open wound, right foot, initial encounter: Secondary | ICD-10-CM

## 2018-05-19 DIAGNOSIS — L97519 Non-pressure chronic ulcer of other part of right foot with unspecified severity: Secondary | ICD-10-CM

## 2018-05-19 DIAGNOSIS — E11621 Type 2 diabetes mellitus with foot ulcer: Secondary | ICD-10-CM

## 2018-05-19 DIAGNOSIS — L03031 Cellulitis of right toe: Secondary | ICD-10-CM

## 2018-05-19 MED ORDER — AMOXICILLIN-POT CLAVULANATE 875-125 MG PO TABS: 1.0000 | ORAL_TABLET | Freq: Two times a day (BID) | ORAL | 0 refills | Status: DC

## 2018-05-21 NOTE — Progress Notes (Signed)
Subjective: 57 year old male presents the office today for follow-up evaluation of an ulceration to his right hallux.  He states that the toe has been doing significantly better and since he saw Dr. March Rummage last appointment.  He is continued on the Augmentin, and he has been continuing with silvadene dressing changes daily. Drainage has much improved as well as the redness.  Denies any systemic complaints such as fevers, chills, nausea, vomiting. No acute changes since last appointment, and no other complaints at this time.   Objective: AAO x3, NAD DP/PT pulses palpable bilaterally, CRT less than 3 seconds On the plantar aspect of the right hallux is a granular wound with hyperkeratotic periwound.  After debridement the wound measures 1 x 0.3 cm with a depth of 0.2 cm.  There is no probing to bone, undermining or tunneling.  There is still edema erythema to the distal portion of the toe but clinically he states is getting much better.  There is no ascending cellulitis there is no increase in warmth of the foot.  There is no fluctuation or crepitation is no malodor. No other open lesions identified at this time. No pain with calf compression, swelling, warmth, erythema  Assessment: Right hallux ulceration, cellulitis  Plan: -All treatment options discussed with the patient including all alternatives, risks, complications.  -I reviewed the blood work with him today as well as his cultures.  We will continue Augmentin and I refilled it for 1 more week.  Continue with Silvadene dressing changes daily.  I did sharply debride the wound today without any complications utilizing #833 blade scalpel as well as a tissue nipper down to healthy, bleeding, granular tissue.  Area was cleaned and Silvadene was applied followed by dry sterile dressing. -Continue with offloading shoe -Monitor for any clinical signs or symptoms of infection and directed to call the office immediately should any occur or go to the  ER. -Patient encouraged to call the office with any questions, concerns, change in symptoms.   Trula Slade DPM

## 2018-05-26 ENCOUNTER — Ambulatory Visit: Payer: Managed Care, Other (non HMO) | Admitting: Podiatry

## 2018-05-26 DIAGNOSIS — L97511 Non-pressure chronic ulcer of other part of right foot limited to breakdown of skin: Secondary | ICD-10-CM | POA: Diagnosis not present

## 2018-05-26 DIAGNOSIS — E11621 Type 2 diabetes mellitus with foot ulcer: Secondary | ICD-10-CM | POA: Diagnosis not present

## 2018-05-26 DIAGNOSIS — L89891 Pressure ulcer of other site, stage 1: Secondary | ICD-10-CM | POA: Diagnosis not present

## 2018-05-26 NOTE — Progress Notes (Signed)
Subjective:  Patient ID: Brandon Rich, male    DOB: 07/24/1960,  MRN: [id number]  Chief Complaint  Patient presents with  . Foot Ulcer    right great toe    57 y.o. male presents for wound care. States the sore is doing much better. Taking abx, only 3 days left. Reports new sore to the leg that he believes is from the boot.  Review of Systems: Negative except as noted in the HPI. Denies N/V/F/Ch.  Past Medical History:  Diagnosis Date  . Acute combined systolic and diastolic heart failure ([place]) 02/16/2016  . Allergy   . Diabetes mellitus   . DOE (dyspnea on exertion) 02/16/2016  . FUO (fever of unknown origin) 02/16/2016  . Glaucoma   . Heart murmur   . Hyperlipidemia   . Hypertension   . LVH (left ventricular hypertrophy) 03/29/2016  . Rash and nonspecific skin eruption 02/16/2016  . T wave inversion in EKG 02/16/2016  . Transaminitis 02/16/2016  . Weight gain 02/16/2016  . Weight loss 02/16/2016    Current Outpatient Medications:  .  amoxicillin-clavulanate (AUGMENTIN) 875-125 MG tablet, Take 1 tablet by mouth 2 (two) times daily., Disp: 14 tablet, Rfl: 0 .  Ascorbic Acid (VITAMIN C) 1000 MG tablet, Take 1,000 mg by mouth daily., Disp: , Rfl:  .  aspirin 81 MG tablet, Take 81 mg by mouth daily., Disp: , Rfl:  .  Aspirin-Calcium Carbonate 81-777 MG TABS, Take by mouth., Disp: , Rfl:  .  atorvastatin (LIPITOR) 20 MG tablet, TAKE ONE TABLET BY MOUTH DAILY, Disp: 30 tablet, Rfl: 0 .  brimonidine (ALPHAGAN) 0.2 % ophthalmic solution, Place 1 drop into both eyes 2 (two) times daily., Disp: , Rfl:  .  calcium carbonate (OS-CAL) 1250 (500 Ca) MG chewable tablet, Chew by mouth., Disp: , Rfl:  .  calcium carbonate (TUMS - DOSED IN MG ELEMENTAL CALCIUM) 500 MG chewable tablet, Chew 2 tablets by mouth daily as needed for indigestion or heartburn., Disp: , Rfl:  .  canagliflozin (INVOKANA) 300 MG TABS tablet, , Disp: , Rfl:  .  cephALEXin (KEFLEX) 500 MG capsule, cephalexin 500 mg  capsule, Disp: , Rfl:  .  Cholecalciferol (VITAMIN D-1000 MAX ST) 1000 units tablet, Take by mouth., Disp: , Rfl:  .  ciprofloxacin (CIPRO) 500 MG tablet, ciprofloxacin 500 mg tablet, Disp: , Rfl:  .  doxycycline (VIBRAMYCIN) 100 MG capsule, doxycycline hyclate 100 mg capsule, Disp: , Rfl:  .  Dulaglutide (TRULICITY) 1.5 EP/3.2RJ SOPN, Trulicity 1.5 JO/8.4 mL subcutaneous pen injector, Disp: , Rfl:  .  empagliflozin (JARDIANCE) 25 MG TABS tablet, Take 25 mg by mouth daily., Disp: 30 tablet, Rfl: 3 .  fexofenadine (ALLEGRA) 180 MG tablet, Take 180 mg by mouth daily., Disp: , Rfl:  .  fluticasone (FLONASE) 50 MCG/ACT nasal spray, PLACE 2 SPRAYS INTO BOTH NOSTRILS DAILY., Disp: 16 g, Rfl: 10 .  ibuprofen (ADVIL,MOTRIN) 600 MG tablet, Take 600 mg by mouth every 6 (six) hours as needed., Disp: , Rfl:  .  Insulin Glargine (BASAGLAR KWIKPEN) 100 UNIT/ML SOPN, Inject into the skin., Disp: , Rfl:  .  liraglutide (VICTOZA) 18 MG/3ML SOPN, , Disp: , Rfl:  .  lisinopril (PRINIVIL,ZESTRIL) 20 MG tablet, TAKE ONE TABLET BY MOUTH DAILY, Disp: 30 tablet, Rfl: 0 .  metFORMIN (GLUCOPHAGE) 1000 MG tablet, TAKE ONE TABLET BY MOUTH TWICE A DAY WITH A MEAL, Disp: 60 tablet, Rfl: 0 .  metroNIDAZOLE (METROGEL) 0.75 % gel, , Disp: , Rfl:  .  Multiple Vitamin (MULTIVITAMIN) tablet, Take 1 tablet by mouth every evening. , Disp: , Rfl:  .  Semaglutide,0.25 or 0.5MG /DOS, (OZEMPIC, 0.25 OR 0.5 MG/DOSE,) 2 MG/1.5ML SOPN, Inject into the skin., Disp: , Rfl:  .  silver sulfADIAZINE (SILVADENE) 1 % cream, Apply pea-sized amount to wound daily., Disp: 50 g, Rfl: 0 .  simvastatin (ZOCOR) 20 MG tablet, , Disp: , Rfl:  .  timolol (BETIMOL) 0.5 % ophthalmic solution, Place 1 drop into both eyes at bedtime. , Disp: , Rfl:  .  timolol (TIMOPTIC-XR) 0.5 % ophthalmic gel-forming, Apply to eye., Disp: , Rfl:   Social History   Tobacco Use  Smoking Status Never Smoker  Smokeless Tobacco Never Used    No Known Allergies Objective:     There were no vitals filed for this visit. There is no height or weight on file to calculate BMI. Constitutional Well developed. Well nourished.  Vascular Dorsalis pedis pulses palpable bilaterally. Posterior tibial pulses palpable bilaterally. Capillary refill normal to all digits.  No cyanosis or clubbing noted. Pedal hair growth normal.  Neurologic Normal speech. Oriented to person, place, and time. Protective sensation absent  Dermatologic Wound Location: R hallux Wound Base: Mixed Granular/Fibrotic Peri-wound: Reddened, Macerated Exudate: Scant/small amount Serosanguinous exudate Right hallux medial ulceration improving, 1cm x 0.5 cm today.  Superficial wound L leg, 0.5 cm diameter x2  Orthopedic: No pain to palpation either foot.   Radiographs: None today.  Assessment:   1. Diabetic ulcer of toe of right foot associated with type 2 diabetes mellitus, limited to breakdown of skin ([place])   2. Pressure ulcer of left leg, stage 1    Plan:  Patient was evaluated and treated and all questions answered.  Ulcer R great toe  -Improving. -Debridement as below. -Dressed with silvadene, DSD. -Continue silvadene -Transition to surgical shoe. Shoe dispensed.  Procedure: Selective Debridement of Wound Rationale: Removal of devitalized tissue from the wound to promote healing.  Pre-Debridement Wound Measurements: 1 cm x 0.5 cm x 0.1 cm  Post-Debridement Wound Measurements: same as pre-debridement. Type of Debridement: sharp selective Tissue Removed: Devitalized soft-tissue Dressing: Dry, sterile, compression dressing. Disposition: Patient tolerated procedure well. Patient to return in 1 week for follow-up.  L Leg Wound -Dressed with silvadene and band-aid. -Will switch from boot to surgical shoe to avoid pressure.  Return in about 2 weeks (around 06/09/2018) for Wound Care, Right.

## 2018-05-27 ENCOUNTER — Other Ambulatory Visit: Payer: Self-pay | Admitting: Family Medicine

## 2018-05-27 DIAGNOSIS — I10 Essential (primary) hypertension: Secondary | ICD-10-CM

## 2018-05-27 DIAGNOSIS — E785 Hyperlipidemia, unspecified: Secondary | ICD-10-CM

## 2018-05-28 ENCOUNTER — Other Ambulatory Visit: Payer: Self-pay | Admitting: Family Medicine

## 2018-05-28 DIAGNOSIS — E119 Type 2 diabetes mellitus without complications: Secondary | ICD-10-CM

## 2018-05-28 DIAGNOSIS — I10 Essential (primary) hypertension: Secondary | ICD-10-CM

## 2018-05-28 DIAGNOSIS — Z794 Long term (current) use of insulin: Principal | ICD-10-CM

## 2018-05-28 DIAGNOSIS — E785 Hyperlipidemia, unspecified: Secondary | ICD-10-CM

## 2018-05-29 NOTE — Telephone Encounter (Signed)
Requested medication (s) are due for refill today: yes  Requested medication (s) are on the active medication list: yes  Last refill:  Atorvastatin 20 mg LR  04/15/18 and Lisinopril  20 mg  LR 04/15/18  Future visit scheduled: no  Notes to clinic:  Labs overdue to be within protocol of 180 days.   Requested Prescriptions  Pending Prescriptions Disp Refills   atorvastatin (LIPITOR) 20 MG tablet [Pharmacy Med Name: ATORVASTATIN 20 MG TABLET] 30 tablet 0    Sig: TAKE ONE TABLET BY MOUTH DAILY     Cardiovascular:  Antilipid - Statins Failed - 05/27/2018  5:49 PM      Failed - HDL in normal range and within 360 days    HDL  Date Value Ref Range Status  09/20/2017 29 (L) >39 mg/dL Final         Failed - Triglycerides in normal range and within 360 days    Triglycerides  Date Value Ref Range Status  09/20/2017 153 (H) 0 - 149 mg/dL Final         Passed - Total Cholesterol in normal range and within 360 days    Cholesterol, Total  Date Value Ref Range Status  09/20/2017 110 100 - 199 mg/dL Final         Passed - LDL in normal range and within 360 days    LDL Calculated  Date Value Ref Range Status  09/20/2017 50 0 - 99 mg/dL Final         Passed - Patient is not pregnant      Passed - Valid encounter within last 12 months    Recent Outpatient Visits          2 weeks ago Open wound of right foot with complication, initial encounter   [facility], Ranell Patrick, MD   3 months ago Cellulitis of leg, left   [facility], Damaris Hippo, PA-C   3 months ago Cellulitis of leg, left   [facility], Damaris Hippo, PA-C   8 months ago Annual physical exam   [facility], Ranell Patrick, MD   11 months ago Type 2 diabetes mellitus without complication, with long-term current use of insulin [facility])   [facility], Ranell Patrick, MD            lisinopril (PRINIVIL,ZESTRIL) 20 MG tablet [Pharmacy Med Name: LISINOPRIL 20 MG  TABLET] 30 tablet 0    Sig: TAKE ONE TABLET BY MOUTH DAILY     Cardiovascular:  ACE Inhibitors Failed - 05/27/2018  5:49 PM      Failed - Cr in normal range and within 180 days    Creat  Date Value Ref Range Status  06/03/2016 0.88 0.70 - 1.33 mg/dL Final    Comment:      For patients > or = 58 years of age: The upper reference limit for Creatinine is approximately 13% higher for people identified as African-American.      Creatinine, Ser  Date Value Ref Range Status  09/20/2017 0.80 0.76 - 1.27 mg/dL Final         Failed - K in normal range and within 180 days    Potassium  Date Value Ref Range Status  09/20/2017 4.1 3.5 - 5.2 mmol/L Final         Failed - Last BP in normal range    BP Readings from Last 1 Encounters:  05/13/18  140/77         Passed - Patient is not pregnant      Passed - Valid encounter within last 6 months    Recent Outpatient Visits          2 weeks ago Open wound of right foot with complication, initial encounter   [facility], Ranell Patrick, MD   3 months ago Cellulitis of leg, left   [facility], PA-C   3 months ago Cellulitis of leg, left   [facility], PA-C   8 months ago Annual physical exam   [facility] [place], MD   11 months ago Type 2 diabetes mellitus without complication, with long-term current use of insulin [facility])   [facility], Ranell Patrick, MD

## 2018-06-09 ENCOUNTER — Ambulatory Visit (INDEPENDENT_AMBULATORY_CARE_PROVIDER_SITE_OTHER): Payer: Managed Care, Other (non HMO) | Admitting: Podiatry

## 2018-06-09 DIAGNOSIS — E11621 Type 2 diabetes mellitus with foot ulcer: Secondary | ICD-10-CM

## 2018-06-09 DIAGNOSIS — L97511 Non-pressure chronic ulcer of other part of right foot limited to breakdown of skin: Secondary | ICD-10-CM

## 2018-06-09 NOTE — Progress Notes (Signed)
Subjective:  Patient ID: Brandon Rich, male    DOB: 10/15/1960,  MRN: [id number]  Chief Complaint  Patient presents with  . Diabetic Ulcer    right great toe    57 y.o. male presents for wound care. States the wound is doing much better. Denies new complaints.  Review of Systems: Negative except as noted in the HPI. Denies N/V/F/Ch.  Past Medical History:  Diagnosis Date  . Acute combined systolic and diastolic heart failure ([place]) 02/16/2016  . Allergy   . Diabetes mellitus   . DOE (dyspnea on exertion) 02/16/2016  . FUO (fever of unknown origin) 02/16/2016  . Glaucoma   . Heart murmur   . Hyperlipidemia   . Hypertension   . LVH (left ventricular hypertrophy) 03/29/2016  . Rash and nonspecific skin eruption 02/16/2016  . T wave inversion in EKG 02/16/2016  . Transaminitis 02/16/2016  . Weight gain 02/16/2016  . Weight loss 02/16/2016    Current Outpatient Medications:  .  amoxicillin-clavulanate (AUGMENTIN) 875-125 MG tablet, Take 1 tablet by mouth 2 (two) times daily., Disp: 14 tablet, Rfl: 0 .  Ascorbic Acid (VITAMIN C) 1000 MG tablet, Take 1,000 mg by mouth daily., Disp: , Rfl:  .  aspirin 81 MG tablet, Take 81 mg by mouth daily., Disp: , Rfl:  .  Aspirin-Calcium Carbonate 81-777 MG TABS, Take by mouth., Disp: , Rfl:  .  atorvastatin (LIPITOR) 20 MG tablet, TAKE ONE TABLET BY MOUTH DAILY, Disp: 90 tablet, Rfl: 0 .  brimonidine (ALPHAGAN) 0.2 % ophthalmic solution, Place 1 drop into both eyes 2 (two) times daily., Disp: , Rfl:  .  calcium carbonate (OS-CAL) 1250 (500 Ca) MG chewable tablet, Chew by mouth., Disp: , Rfl:  .  calcium carbonate (TUMS - DOSED IN MG ELEMENTAL CALCIUM) 500 MG chewable tablet, Chew 2 tablets by mouth daily as needed for indigestion or heartburn., Disp: , Rfl:  .  canagliflozin (INVOKANA) 300 MG TABS tablet, , Disp: , Rfl:  .  cephALEXin (KEFLEX) 500 MG capsule, cephalexin 500 mg capsule, Disp: , Rfl:  .  Cholecalciferol (VITAMIN D-1000 MAX ST) 1000  units tablet, Take by mouth., Disp: , Rfl:  .  ciprofloxacin (CIPRO) 500 MG tablet, ciprofloxacin 500 mg tablet, Disp: , Rfl:  .  doxycycline (VIBRAMYCIN) 100 MG capsule, doxycycline hyclate 100 mg capsule, Disp: , Rfl:  .  Dulaglutide (TRULICITY) 1.5 IZ/1.2WP SOPN, Trulicity 1.5 YK/9.9 mL subcutaneous pen injector, Disp: , Rfl:  .  empagliflozin (JARDIANCE) 25 MG TABS tablet, Take 25 mg by mouth daily., Disp: 30 tablet, Rfl: 3 .  fexofenadine (ALLEGRA) 180 MG tablet, Take 180 mg by mouth daily., Disp: , Rfl:  .  fluticasone (FLONASE) 50 MCG/ACT nasal spray, PLACE 2 SPRAYS INTO BOTH NOSTRILS DAILY., Disp: 16 g, Rfl: 10 .  ibuprofen (ADVIL,MOTRIN) 600 MG tablet, Take 600 mg by mouth every 6 (six) hours as needed., Disp: , Rfl:  .  Insulin Glargine (BASAGLAR KWIKPEN) 100 UNIT/ML SOPN, Inject into the skin., Disp: , Rfl:  .  liraglutide (VICTOZA) 18 MG/3ML SOPN, , Disp: , Rfl:  .  lisinopril (PRINIVIL,ZESTRIL) 20 MG tablet, TAKE ONE TABLET BY MOUTH DAILY, Disp: 90 tablet, Rfl: 1 .  metFORMIN (GLUCOPHAGE) 1000 MG tablet, TAKE ONE TABLET BY MOUTH TWO TIMES A DAY WITH A MEAL, Disp: 180 tablet, Rfl: 1 .  metroNIDAZOLE (METROGEL) 0.75 % gel, , Disp: , Rfl:  .  Multiple Vitamin (MULTIVITAMIN) tablet, Take 1 tablet by mouth every evening. , Disp: ,  Rfl:  .  Semaglutide,0.25 or 0.5MG /DOS, (OZEMPIC, 0.25 OR 0.5 MG/DOSE,) 2 MG/1.5ML SOPN, Inject into the skin., Disp: , Rfl:  .  silver sulfADIAZINE (SILVADENE) 1 % cream, Apply pea-sized amount to wound daily., Disp: 50 g, Rfl: 0 .  simvastatin (ZOCOR) 20 MG tablet, , Disp: , Rfl:  .  timolol (BETIMOL) 0.5 % ophthalmic solution, Place 1 drop into both eyes at bedtime. , Disp: , Rfl:  .  timolol (TIMOPTIC-XR) 0.5 % ophthalmic gel-forming, Apply to eye., Disp: , Rfl:   Social History   Tobacco Use  Smoking Status Never Smoker  Smokeless Tobacco Never Used    No Known Allergies Objective:   There were no vitals filed for this visit. There is no height  or weight on file to calculate BMI. Constitutional Well developed. Well nourished.  Vascular Dorsalis pedis pulses palpable bilaterally. Posterior tibial pulses palpable bilaterally. Capillary refill normal to all digits.  No cyanosis or clubbing noted. Pedal hair growth normal.  Neurologic Normal speech. Oriented to person, place, and time. Protective sensation absent  Dermatologic Wound Location: R hallux Wound Base: Mixed Granular/Fibrotic Peri-wound: Reddened, Macerated Exudate: Scant/small amount Serosanguinous exudate Right hallux medial ulceration improving, 1cm x 0.5 cm today.  Superficial wound L leg, 0.5 cm diameter x2  Orthopedic: No pain to palpation either foot.   Radiographs: None today.  Assessment:   1. Diabetic ulcer of toe of right foot associated with type 2 diabetes mellitus, limited to breakdown of skin ([place])    Plan:  Patient was evaluated and treated and all questions answered.  Ulcer R great toe  -Improving. -Debridement as below. -Dressed with silvadene, DSD. -Continue silvadene  Procedure: Selective Debridement of Wound Rationale: Removal of devitalized tissue from the wound to promote healing.  Pre-Debridement Wound Measurements: 0.3 cm x 03  cm x 0.1 cm  Post-Debridement Wound Measurements: same as pre-debridement. Type of Debridement: sharp selective Tissue Removed: Devitalized soft-tissue Dressing: Dry, sterile, compression dressing. Disposition: Patient tolerated procedure well. Patient to return in 1 week for follow-up.   L Leg Wound -Dressed with silvadene and band-aid. -Will switch from boot to surgical shoe to avoid pressure.  Return in about 2 weeks (around 06/23/2018) for Wound Care.

## 2018-06-23 ENCOUNTER — Ambulatory Visit (INDEPENDENT_AMBULATORY_CARE_PROVIDER_SITE_OTHER): Payer: Managed Care, Other (non HMO)

## 2018-06-23 ENCOUNTER — Encounter: Payer: Self-pay | Admitting: Podiatry

## 2018-06-23 ENCOUNTER — Ambulatory Visit: Payer: Managed Care, Other (non HMO) | Admitting: Podiatry

## 2018-06-23 DIAGNOSIS — L97511 Non-pressure chronic ulcer of other part of right foot limited to breakdown of skin: Secondary | ICD-10-CM

## 2018-06-23 DIAGNOSIS — E11621 Type 2 diabetes mellitus with foot ulcer: Secondary | ICD-10-CM

## 2018-06-28 DIAGNOSIS — M79676 Pain in unspecified toe(s): Secondary | ICD-10-CM

## 2018-07-04 ENCOUNTER — Telehealth: Payer: Self-pay | Admitting: Podiatry

## 2018-07-04 MED ORDER — SILVER SULFADIAZINE 1 % EX CREA: TOPICAL_CREAM | CUTANEOUS | 0 refills | Status: DC

## 2018-07-04 NOTE — Addendum Note (Signed)
Addended by: Harriett Sine D on: 07/04/2018 04:59 PM   Modules accepted: Orders

## 2018-07-04 NOTE — Telephone Encounter (Signed)
Pt states he uses Public house manager at [facility]. I reordered the Silvadene to [facility].

## 2018-07-04 NOTE — Addendum Note (Signed)
Addended by: Harriett Sine D on: 07/04/2018 04:50 PM   Modules accepted: Orders

## 2018-07-04 NOTE — Telephone Encounter (Signed)
Left message informing pt the silvadene cream had been sent to the [facility].

## 2018-07-04 NOTE — Telephone Encounter (Signed)
Patient was suppose to get a refill on his medication  silver sulfADIAZINE (SILVADENE) 1 % cream. No refill on current medication.

## 2018-07-06 ENCOUNTER — Ambulatory Visit: Payer: Managed Care, Other (non HMO) | Admitting: Podiatry

## 2018-07-13 ENCOUNTER — Ambulatory Visit: Payer: Managed Care, Other (non HMO) | Admitting: Podiatry

## 2018-07-13 ENCOUNTER — Encounter: Payer: Self-pay | Admitting: Podiatry

## 2018-07-13 DIAGNOSIS — M2012 Hallux valgus (acquired), left foot: Secondary | ICD-10-CM

## 2018-07-13 DIAGNOSIS — E11621 Type 2 diabetes mellitus with foot ulcer: Secondary | ICD-10-CM

## 2018-07-13 DIAGNOSIS — M19079 Primary osteoarthritis, unspecified ankle and foot: Secondary | ICD-10-CM

## 2018-07-13 DIAGNOSIS — L97511 Non-pressure chronic ulcer of other part of right foot limited to breakdown of skin: Secondary | ICD-10-CM

## 2018-07-13 DIAGNOSIS — M205X2 Other deformities of toe(s) (acquired), left foot: Secondary | ICD-10-CM | POA: Diagnosis not present

## 2018-08-11 ENCOUNTER — Encounter: Payer: Self-pay | Admitting: Podiatry

## 2018-08-11 ENCOUNTER — Ambulatory Visit: Payer: Managed Care, Other (non HMO) | Admitting: Podiatry

## 2018-08-11 VITALS — BP 151/86 | HR 93 | Temp 98.9°F | Resp 16

## 2018-08-11 DIAGNOSIS — M21611 Bunion of right foot: Secondary | ICD-10-CM | POA: Diagnosis not present

## 2018-08-11 DIAGNOSIS — L97511 Non-pressure chronic ulcer of other part of right foot limited to breakdown of skin: Secondary | ICD-10-CM

## 2018-08-11 DIAGNOSIS — M2011 Hallux valgus (acquired), right foot: Secondary | ICD-10-CM

## 2018-08-11 DIAGNOSIS — E11621 Type 2 diabetes mellitus with foot ulcer: Secondary | ICD-10-CM

## 2018-08-12 NOTE — Progress Notes (Signed)
Subjective:  Patient ID: Brandon Rich, male    DOB: 07-16-1961,  MRN: [id number]  Chief Complaint  Patient presents with  . Foot Ulcer    F/U R hallux ulcer Pt. states," it's better, it's closed up." Tx: bandaid and silvadene -pt denies N/V/F/Ch/drainage/swelling/redness   . Diabetes    FBS: 142 A1c: 8    58 y.o. male presents for wound care.  Thinks the wound is doing better states that he believes it is closed.  Review of Systems: Negative except as noted in the HPI. Denies N/V/F/Ch.  Past Medical History:  Diagnosis Date  . Acute combined systolic and diastolic heart failure ([place]) 02/16/2016  . Allergy   . Diabetes mellitus   . DOE (dyspnea on exertion) 02/16/2016  . FUO (fever of unknown origin) 02/16/2016  . Glaucoma   . Heart murmur   . Hyperlipidemia   . Hypertension   . LVH (left ventricular hypertrophy) 03/29/2016  . Rash and nonspecific skin eruption 02/16/2016  . T wave inversion in EKG 02/16/2016  . Transaminitis 02/16/2016  . Weight gain 02/16/2016  . Weight loss 02/16/2016    Current Outpatient Medications:  .  amoxicillin-clavulanate (AUGMENTIN) 875-125 MG tablet, Take 1 tablet by mouth 2 (two) times daily., Disp: 14 tablet, Rfl: 0 .  Ascorbic Acid (VITAMIN C) 1000 MG tablet, Take 1,000 mg by mouth daily., Disp: , Rfl:  .  aspirin 81 MG tablet, Take 81 mg by mouth daily., Disp: , Rfl:  .  Aspirin-Calcium Carbonate 81-777 MG TABS, Take by mouth., Disp: , Rfl:  .  atorvastatin (LIPITOR) 20 MG tablet, TAKE ONE TABLET BY MOUTH DAILY, Disp: 90 tablet, Rfl: 0 .  BD PEN NEEDLE NANO U/F 32G X 4 MM MISC, , Disp: , Rfl:  .  brimonidine (ALPHAGAN) 0.2 % ophthalmic solution, Place 1 drop into both eyes 2 (two) times daily., Disp: , Rfl:  .  calcium carbonate (OS-CAL) 1250 (500 Ca) MG chewable tablet, Chew by mouth., Disp: , Rfl:  .  calcium carbonate (TUMS - DOSED IN MG ELEMENTAL CALCIUM) 500 MG chewable tablet, Chew 2 tablets by mouth daily as needed for indigestion or  heartburn., Disp: , Rfl:  .  canagliflozin (INVOKANA) 300 MG TABS tablet, , Disp: , Rfl:  .  cephALEXin (KEFLEX) 500 MG capsule, cephalexin 500 mg capsule, Disp: , Rfl:  .  Cholecalciferol (VITAMIN D-1000 MAX ST) 1000 units tablet, Take by mouth., Disp: , Rfl:  .  ciprofloxacin (CIPRO) 500 MG tablet, ciprofloxacin 500 mg tablet, Disp: , Rfl:  .  doxycycline (VIBRAMYCIN) 100 MG capsule, doxycycline hyclate 100 mg capsule, Disp: , Rfl:  .  Dulaglutide (TRULICITY) 1.5 OE/4.2PN SOPN, Trulicity 1.5 TI/1.4 mL subcutaneous pen injector, Disp: , Rfl:  .  empagliflozin (JARDIANCE) 25 MG TABS tablet, Take 25 mg by mouth daily., Disp: 30 tablet, Rfl: 3 .  fexofenadine (ALLEGRA) 180 MG tablet, Take 180 mg by mouth daily., Disp: , Rfl:  .  fluticasone (FLONASE) 50 MCG/ACT nasal spray, PLACE 2 SPRAYS INTO BOTH NOSTRILS DAILY., Disp: 16 g, Rfl: 10 .  ibuprofen (ADVIL,MOTRIN) 600 MG tablet, Take 600 mg by mouth every 6 (six) hours as needed., Disp: , Rfl:  .  Insulin Glargine (BASAGLAR KWIKPEN) 100 UNIT/ML SOPN, Inject into the skin., Disp: , Rfl:  .  liraglutide (VICTOZA) 18 MG/3ML SOPN, , Disp: , Rfl:  .  lisinopril (PRINIVIL,ZESTRIL) 20 MG tablet, TAKE ONE TABLET BY MOUTH DAILY, Disp: 90 tablet, Rfl: 1 .  metFORMIN (GLUCOPHAGE) 1000  MG tablet, TAKE ONE TABLET BY MOUTH TWO TIMES A DAY WITH A MEAL, Disp: 180 tablet, Rfl: 1 .  metroNIDAZOLE (METROGEL) 0.75 % gel, , Disp: , Rfl:  .  Multiple Vitamin (MULTIVITAMIN) tablet, Take 1 tablet by mouth every evening. , Disp: , Rfl:  .  Semaglutide,0.25 or 0.5MG /DOS, (OZEMPIC, 0.25 OR 0.5 MG/DOSE,) 2 MG/1.5ML SOPN, Inject into the skin., Disp: , Rfl:  .  silver sulfADIAZINE (SILVADENE) 1 % cream, Apply pea-sized amount to wound daily., Disp: 50 g, Rfl: 0 .  simvastatin (ZOCOR) 20 MG tablet, , Disp: , Rfl:  .  timolol (BETIMOL) 0.5 % ophthalmic solution, Place 1 drop into both eyes at bedtime. , Disp: , Rfl:  .  timolol (TIMOPTIC-XR) 0.5 % ophthalmic gel-forming, Apply to  eye., Disp: , Rfl:   Social History   Tobacco Use  Smoking Status Never Smoker  Smokeless Tobacco Never Used    No Known Allergies Objective:   Vitals:   08/11/18 0757  BP: (!) 151/86  Pulse: 93  Resp: 16  Temp: 98.9 F (37.2 C)   There is no height or weight on file to calculate BMI. Constitutional Well developed. Well nourished.  Vascular Dorsalis pedis pulses palpable bilaterally. Posterior tibial pulses palpable bilaterally. Capillary refill normal to all digits.  No cyanosis or clubbing noted. Pedal hair growth normal.  Neurologic Normal speech. Oriented to person, place, and time. Protective sensation absent  Dermatologic Wound Location: R hallux Wound Base: Mixed Granular/Fibrotic Peri-wound: Reddened, Macerated Exudate: Scant/small amount Serosanguinous exudate Right hallux medial ulceration measuring 0.5 x 0.5 post debridement  Orthopedic:  Hallux abductovalgus right hallux interphalangeus right   Radiographs: None today.  Assessment:   1. Diabetic ulcer of toe of right foot associated with type 2 diabetes mellitus, limited to breakdown of skin (HCC)   2. 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 Hallux interphalangeus, acquired, right    Plan:  Patient was evaluated and treated and all questions answered.  Ulcer R great toe  -Improving. -Debridement as below. -Dressed with silvadene, DSD. -Continue silvadene  Procedure: Selective Debridement of Wound Rationale: Removal of devitalized tissue from the wound to promote healing.  Pre-Debridement Wound Measurements: 0.50 cm x 0.50 cm x 0.1 cm  Post-Debridement Wound Measurements: same as pre-debridement. Type of Debridement: sharp selective Tissue Removed: Devitalized soft-tissue Dressing: Dry, sterile, compression dressing. Disposition: Patient tolerated procedure well. Patient to return in 1 week for follow-up.   Hallux valgus right, hallux interphalangeus -Discussed with patient that as  long as he wears the boot the wound is likely to improve however I worry about the wound recurring long-term due to the severe pressure from the underlying biomechanical deformity.  Discussed that once he gets out of the boot I think the ulceration will return and worsen.  Patient reluctant to pursue surgery at this time.  I discussed that we can trial a custom molded diabetic insert to offload the ulceration that he could wear daily and should this not work we would consider surgical offloading via realignment of the first ray.  Patient verbalized understanding.  No follow-ups on file.

## 2018-08-13 NOTE — Progress Notes (Signed)
Subjective:  Patient ID: Brandon Rich, male    DOB: Jun 25, 1961,  MRN: [id number]  Chief Complaint  Patient presents with  . Foot Ulcer    right foot great toe; pt stated, "think its trying to open up again"    58 y.o. male presents for wound care. Thinks the wound is trying to open up again.  Review of Systems: Negative except as noted in the HPI. Denies N/V/F/Ch.  Past Medical History:  Diagnosis Date  . Acute combined systolic and diastolic heart failure ([place]) 02/16/2016  . Allergy   . Diabetes mellitus   . DOE (dyspnea on exertion) 02/16/2016  . FUO (fever of unknown origin) 02/16/2016  . Glaucoma   . Heart murmur   . Hyperlipidemia   . Hypertension   . LVH (left ventricular hypertrophy) 03/29/2016  . Rash and nonspecific skin eruption 02/16/2016  . T wave inversion in EKG 02/16/2016  . Transaminitis 02/16/2016  . Weight gain 02/16/2016  . Weight loss 02/16/2016    Current Outpatient Medications:  .  amoxicillin-clavulanate (AUGMENTIN) 875-125 MG tablet, Take 1 tablet by mouth 2 (two) times daily., Disp: 14 tablet, Rfl: 0 .  Ascorbic Acid (VITAMIN C) 1000 MG tablet, Take 1,000 mg by mouth daily., Disp: , Rfl:  .  aspirin 81 MG tablet, Take 81 mg by mouth daily., Disp: , Rfl:  .  Aspirin-Calcium Carbonate 81-777 MG TABS, Take by mouth., Disp: , Rfl:  .  atorvastatin (LIPITOR) 20 MG tablet, TAKE ONE TABLET BY MOUTH DAILY, Disp: 90 tablet, Rfl: 0 .  brimonidine (ALPHAGAN) 0.2 % ophthalmic solution, Place 1 drop into both eyes 2 (two) times daily., Disp: , Rfl:  .  calcium carbonate (OS-CAL) 1250 (500 Ca) MG chewable tablet, Chew by mouth., Disp: , Rfl:  .  calcium carbonate (TUMS - DOSED IN MG ELEMENTAL CALCIUM) 500 MG chewable tablet, Chew 2 tablets by mouth daily as needed for indigestion or heartburn., Disp: , Rfl:  .  canagliflozin (INVOKANA) 300 MG TABS tablet, , Disp: , Rfl:  .  cephALEXin (KEFLEX) 500 MG capsule, cephalexin 500 mg capsule, Disp: , Rfl:  .   Cholecalciferol (VITAMIN D-1000 MAX ST) 1000 units tablet, Take by mouth., Disp: , Rfl:  .  ciprofloxacin (CIPRO) 500 MG tablet, ciprofloxacin 500 mg tablet, Disp: , Rfl:  .  doxycycline (VIBRAMYCIN) 100 MG capsule, doxycycline hyclate 100 mg capsule, Disp: , Rfl:  .  Dulaglutide (TRULICITY) 1.5 NG/2.9BM SOPN, Trulicity 1.5 WU/1.3 mL subcutaneous pen injector, Disp: , Rfl:  .  empagliflozin (JARDIANCE) 25 MG TABS tablet, Take 25 mg by mouth daily., Disp: 30 tablet, Rfl: 3 .  fexofenadine (ALLEGRA) 180 MG tablet, Take 180 mg by mouth daily., Disp: , Rfl:  .  fluticasone (FLONASE) 50 MCG/ACT nasal spray, PLACE 2 SPRAYS INTO BOTH NOSTRILS DAILY., Disp: 16 g, Rfl: 10 .  ibuprofen (ADVIL,MOTRIN) 600 MG tablet, Take 600 mg by mouth every 6 (six) hours as needed., Disp: , Rfl:  .  Insulin Glargine (BASAGLAR KWIKPEN) 100 UNIT/ML SOPN, Inject into the skin., Disp: , Rfl:  .  liraglutide (VICTOZA) 18 MG/3ML SOPN, , Disp: , Rfl:  .  lisinopril (PRINIVIL,ZESTRIL) 20 MG tablet, TAKE ONE TABLET BY MOUTH DAILY, Disp: 90 tablet, Rfl: 1 .  metFORMIN (GLUCOPHAGE) 1000 MG tablet, TAKE ONE TABLET BY MOUTH TWO TIMES A DAY WITH A MEAL, Disp: 180 tablet, Rfl: 1 .  metroNIDAZOLE (METROGEL) 0.75 % gel, , Disp: , Rfl:  .  Multiple Vitamin (MULTIVITAMIN) tablet, Take  1 tablet by mouth every evening. , Disp: , Rfl:  .  Semaglutide,0.25 or 0.5MG /DOS, (OZEMPIC, 0.25 OR 0.5 MG/DOSE,) 2 MG/1.5ML SOPN, Inject into the skin., Disp: , Rfl:  .  simvastatin (ZOCOR) 20 MG tablet, , Disp: , Rfl:  .  timolol (BETIMOL) 0.5 % ophthalmic solution, Place 1 drop into both eyes at bedtime. , Disp: , Rfl:  .  timolol (TIMOPTIC-XR) 0.5 % ophthalmic gel-forming, Apply to eye., Disp: , Rfl:  .  BD PEN NEEDLE NANO U/F 32G X 4 MM MISC, , Disp: , Rfl:  .  silver sulfADIAZINE (SILVADENE) 1 % cream, Apply pea-sized amount to wound daily., Disp: 50 g, Rfl: 0  Social History   Tobacco Use  Smoking Status Never Smoker  Smokeless Tobacco Never Used      No Known Allergies Objective:   There were no vitals filed for this visit. There is no height or weight on file to calculate BMI. Constitutional Well developed. Well nourished.  Vascular Dorsalis pedis pulses palpable bilaterally. Posterior tibial pulses palpable bilaterally. Capillary refill normal to all digits.  No cyanosis or clubbing noted. Pedal hair growth normal.  Neurologic Normal speech. Oriented to person, place, and time. Protective sensation absent  Dermatologic Wound Location: R hallux Wound Base: Mixed Granular/Fibrotic Peri-wound: Reddened, Macerated Exudate: Scant/small amount Serosanguinous exudate Right hallux medial ulceration , 1.5x1  Orthopedic: No pain to palpation either foot.   Radiographs: None today.  Assessment:   1. Diabetic ulcer of toe of right foot associated with type 2 diabetes mellitus, limited to breakdown of skin ([place])    Plan:  Patient was evaluated and treated and all questions answered.  Ulcer R great toe  -Worsening noted today. -Debridement as below. -Dressed with silvadene, DSD. -Continue silvadene -Discussed with patient that should the ulceration be worsened next visit we will discuss surgical intervention to realign the hallux and prevent further pressure  Procedure: Selective Debridement of Wound Rationale: Removal of devitalized tissue from the wound to promote healing.  Pre-Debridement Wound Measurements: 1.5 cm x 1 cm x 0.1 cm  Post-Debridement Wound Measurements: same as pre-debridement. Type of Debridement: sharp selective Tissue Removed: Devitalized soft-tissue Dressing: Dry, sterile, compression dressing. Disposition: Patient tolerated procedure well. Patient to return in 1 week for follow-up.     Return in about 2 weeks (around 07/07/2018) for Wound Care, Right great toe, possible surgical discussion.

## 2018-08-13 NOTE — Progress Notes (Signed)
Subjective:  Patient ID: Brandon Rich, male    DOB: 1961/06/28,  MRN: [id number]  Chief Complaint  Patient presents with  . Foot Ulcer    2WK Wound Care, Right great toe, possible surgical discussion    58 y.o. male presents for wound care. Thinks it is doing better since he is wearing his boot more.  Review of Systems: Negative except as noted in the HPI. Denies N/V/F/Ch.  Past Medical History:  Diagnosis Date  . Acute combined systolic and diastolic heart failure ([place]) 02/16/2016  . Allergy   . Diabetes mellitus   . DOE (dyspnea on exertion) 02/16/2016  . FUO (fever of unknown origin) 02/16/2016  . Glaucoma   . Heart murmur   . Hyperlipidemia   . Hypertension   . LVH (left ventricular hypertrophy) 03/29/2016  . Rash and nonspecific skin eruption 02/16/2016  . T wave inversion in EKG 02/16/2016  . Transaminitis 02/16/2016  . Weight gain 02/16/2016  . Weight loss 02/16/2016    Current Outpatient Medications:  .  amoxicillin-clavulanate (AUGMENTIN) 875-125 MG tablet, Take 1 tablet by mouth 2 (two) times daily., Disp: 14 tablet, Rfl: 0 .  Ascorbic Acid (VITAMIN C) 1000 MG tablet, Take 1,000 mg by mouth daily., Disp: , Rfl:  .  aspirin 81 MG tablet, Take 81 mg by mouth daily., Disp: , Rfl:  .  Aspirin-Calcium Carbonate 81-777 MG TABS, Take by mouth., Disp: , Rfl:  .  atorvastatin (LIPITOR) 20 MG tablet, TAKE ONE TABLET BY MOUTH DAILY, Disp: 90 tablet, Rfl: 0 .  BD PEN NEEDLE NANO U/F 32G X 4 MM MISC, , Disp: , Rfl:  .  brimonidine (ALPHAGAN) 0.2 % ophthalmic solution, Place 1 drop into both eyes 2 (two) times daily., Disp: , Rfl:  .  calcium carbonate (OS-CAL) 1250 (500 Ca) MG chewable tablet, Chew by mouth., Disp: , Rfl:  .  calcium carbonate (TUMS - DOSED IN MG ELEMENTAL CALCIUM) 500 MG chewable tablet, Chew 2 tablets by mouth daily as needed for indigestion or heartburn., Disp: , Rfl:  .  canagliflozin (INVOKANA) 300 MG TABS tablet, , Disp: , Rfl:  .  cephALEXin (KEFLEX) 500  MG capsule, cephalexin 500 mg capsule, Disp: , Rfl:  .  Cholecalciferol (VITAMIN D-1000 MAX ST) 1000 units tablet, Take by mouth., Disp: , Rfl:  .  ciprofloxacin (CIPRO) 500 MG tablet, ciprofloxacin 500 mg tablet, Disp: , Rfl:  .  doxycycline (VIBRAMYCIN) 100 MG capsule, doxycycline hyclate 100 mg capsule, Disp: , Rfl:  .  Dulaglutide (TRULICITY) 1.5 OE/7.0JJ SOPN, Trulicity 1.5 KK/9.3 mL subcutaneous pen injector, Disp: , Rfl:  .  empagliflozin (JARDIANCE) 25 MG TABS tablet, Take 25 mg by mouth daily., Disp: 30 tablet, Rfl: 3 .  fexofenadine (ALLEGRA) 180 MG tablet, Take 180 mg by mouth daily., Disp: , Rfl:  .  fluticasone (FLONASE) 50 MCG/ACT nasal spray, PLACE 2 SPRAYS INTO BOTH NOSTRILS DAILY., Disp: 16 g, Rfl: 10 .  ibuprofen (ADVIL,MOTRIN) 600 MG tablet, Take 600 mg by mouth every 6 (six) hours as needed., Disp: , Rfl:  .  Insulin Glargine (BASAGLAR KWIKPEN) 100 UNIT/ML SOPN, Inject into the skin., Disp: , Rfl:  .  liraglutide (VICTOZA) 18 MG/3ML SOPN, , Disp: , Rfl:  .  lisinopril (PRINIVIL,ZESTRIL) 20 MG tablet, TAKE ONE TABLET BY MOUTH DAILY, Disp: 90 tablet, Rfl: 1 .  metFORMIN (GLUCOPHAGE) 1000 MG tablet, TAKE ONE TABLET BY MOUTH TWO TIMES A DAY WITH A MEAL, Disp: 180 tablet, Rfl: 1 .  metroNIDAZOLE (  METROGEL) 0.75 % gel, , Disp: , Rfl:  .  Multiple Vitamin (MULTIVITAMIN) tablet, Take 1 tablet by mouth every evening. , Disp: , Rfl:  .  Semaglutide,0.25 or 0.5MG /DOS, (OZEMPIC, 0.25 OR 0.5 MG/DOSE,) 2 MG/1.5ML SOPN, Inject into the skin., Disp: , Rfl:  .  silver sulfADIAZINE (SILVADENE) 1 % cream, Apply pea-sized amount to wound daily., Disp: 50 g, Rfl: 0 .  simvastatin (ZOCOR) 20 MG tablet, , Disp: , Rfl:  .  timolol (BETIMOL) 0.5 % ophthalmic solution, Place 1 drop into both eyes at bedtime. , Disp: , Rfl:  .  timolol (TIMOPTIC-XR) 0.5 % ophthalmic gel-forming, Apply to eye., Disp: , Rfl:   Social History   Tobacco Use  Smoking Status Never Smoker  Smokeless Tobacco Never Used     No Known Allergies Objective:   There were no vitals filed for this visit. There is no height or weight on file to calculate BMI. Constitutional Well developed. Well nourished.  Vascular Dorsalis pedis pulses palpable bilaterally. Posterior tibial pulses palpable bilaterally. Capillary refill normal to all digits.  No cyanosis or clubbing noted. Pedal hair growth normal.  Neurologic Normal speech. Oriented to person, place, and time. Protective sensation absent  Dermatologic Wound Location: R hallux Wound Base: Mixed Granular/Fibrotic Peri-wound: Macerated Exudate: Scant/small amount Serosanguinous exudate Right hallux medial ulceration , 1x1  Orthopedic: No pain to palpation either foot.   Radiographs: None today.  Assessment:   1. Hallux limitus of left foot   2. Hallux interphalangeus, acquired, left   3. Arthritis of big toe   4. Diabetic ulcer of toe of right foot associated with type 2 diabetes mellitus, limited to breakdown of skin ([place])    Plan:  Patient was evaluated and treated and all questions answered.  Ulcer R great toe  -Worsening noted today. -Debridement as below. -Dressed with silvadene, DSD. -Continue silvadene  Procedure: Selective Debridement of Wound Rationale: Removal of devitalized tissue from the wound to promote healing.  Pre-Debridement Wound Measurements: 1 cm x 1 cm x 0.1 cm  Post-Debridement Wound Measurements: same as pre-debridement. Type of Debridement: sharp selective Tissue Removed: Devitalized soft-tissue Dressing: Dry, sterile, compression dressing. Disposition: Patient tolerated procedure well. Patient to return in 1 week for follow-up.  Hallux Valgus, Hallux Interphalangeus -X-rays again reviewed with patient -Discussed performing surgical realignment of the hallux including hallux arthroplasty, wound closure with tissue transfer to close the ulceration.  Discussed that due to the arthritic nature of the joint and the  intrinsic deformity of the joint the joint is prone to higher pressure and without alleviating this pressure toe is likely to continue to breakdown.  Patient stated that he would like to consider a second opinion and I advised him that he is welcome to do so we will have him follow-up in a month or so to reevaluate.  Return in about 4 weeks (around 08/10/2018) for Wound Care, Right.

## 2018-08-26 ENCOUNTER — Other Ambulatory Visit: Payer: Self-pay | Admitting: Family Medicine

## 2018-08-26 DIAGNOSIS — E785 Hyperlipidemia, unspecified: Secondary | ICD-10-CM

## 2018-08-26 DIAGNOSIS — I10 Essential (primary) hypertension: Secondary | ICD-10-CM

## 2018-09-04 ENCOUNTER — Ambulatory Visit (INDEPENDENT_AMBULATORY_CARE_PROVIDER_SITE_OTHER): Payer: Managed Care, Other (non HMO) | Admitting: Orthotics

## 2018-09-04 DIAGNOSIS — L97511 Non-pressure chronic ulcer of other part of right foot limited to breakdown of skin: Secondary | ICD-10-CM | POA: Diagnosis not present

## 2018-09-04 DIAGNOSIS — M2011 Hallux valgus (acquired), right foot: Secondary | ICD-10-CM | POA: Diagnosis not present

## 2018-09-04 DIAGNOSIS — E11621 Type 2 diabetes mellitus with foot ulcer: Secondary | ICD-10-CM

## 2018-09-04 DIAGNOSIS — M21611 Bunion of right foot: Secondary | ICD-10-CM | POA: Diagnosis not present

## 2018-09-04 DIAGNOSIS — M205X2 Other deformities of toe(s) (acquired), left foot: Secondary | ICD-10-CM

## 2018-09-04 NOTE — Progress Notes (Signed)
pateint cast for CMFO to address foot ulcer big toe RIGHT.   Richy to fab trilam with protective zote cover; in mean time, he was given diabetic OTS insert which was modified (k-wedge) to take pressure of medial boarder of hallux.

## 2018-10-02 ENCOUNTER — Other Ambulatory Visit: Payer: Self-pay

## 2018-10-02 ENCOUNTER — Telehealth: Payer: Self-pay | Admitting: Podiatry

## 2018-10-02 ENCOUNTER — Ambulatory Visit: Payer: Managed Care, Other (non HMO) | Admitting: Orthotics

## 2018-10-02 DIAGNOSIS — M2011 Hallux valgus (acquired), right foot: Secondary | ICD-10-CM

## 2018-10-02 DIAGNOSIS — M205X2 Other deformities of toe(s) (acquired), left foot: Secondary | ICD-10-CM

## 2018-10-02 DIAGNOSIS — E11621 Type 2 diabetes mellitus with foot ulcer: Secondary | ICD-10-CM

## 2018-10-02 DIAGNOSIS — L97511 Non-pressure chronic ulcer of other part of right foot limited to breakdown of skin: Principal | ICD-10-CM

## 2018-10-02 DIAGNOSIS — M21611 Bunion of right foot: Secondary | ICD-10-CM

## 2018-10-02 NOTE — Progress Notes (Signed)
Patient came in today to pick up custom made foot orthotics.  The goals were accomplished and the patient reported no dissatisfaction with said orthotics.  Patient was advised of breakin period and how to report any issues. 

## 2018-10-02 NOTE — Telephone Encounter (Signed)
Pt was asking about a letter that Dr was to have ready for him detailing his treatment for his insurance company. Contacted Dr 1 month ago about this at last visit. Please contact at [phone] with any question. Brandon Rich

## 2018-11-24 ENCOUNTER — Other Ambulatory Visit: Payer: Self-pay | Admitting: Family Medicine

## 2018-11-24 DIAGNOSIS — E119 Type 2 diabetes mellitus without complications: Secondary | ICD-10-CM

## 2018-11-24 DIAGNOSIS — E785 Hyperlipidemia, unspecified: Secondary | ICD-10-CM

## 2018-11-24 DIAGNOSIS — I10 Essential (primary) hypertension: Secondary | ICD-10-CM

## 2018-11-24 DIAGNOSIS — Z794 Long term (current) use of insulin: Secondary | ICD-10-CM

## 2018-11-24 NOTE — Telephone Encounter (Signed)
Requested medication (s) are due for refill today:   Yes for all 3 medications  Requested medication (s) are on the active medication list:   Yes for all 3 medications  Future visit scheduled:   No.   Needs yearly f/u.   LOV for CPE was 09/20/17 with Dr. Carlota Raspberry   Last ordered: Metformin 1,000 mg  LR 05/29/18  #180  1 refill;       Lipitor 20 mg  LR 08/28/2018  #90  0 refills;     Lisinopril 20 mg  LR  05/29/18  #90   1 refill  Returned for virtual visit scheduling.  Overdue for yearly f/u.   Requested Prescriptions  Pending Prescriptions Disp Refills   metFORMIN (GLUCOPHAGE) 1000 MG tablet [Pharmacy Med Name: metFORMIN HCL 1,000 MG TABLET] 180 tablet 0    Sig: TAKE ONE TABLET BY MOUTH TWO TIMES A DAY WITH A MEAL     Endocrinology:  Diabetes - Biguanides Failed - 11/24/2018  9:53 AM      Failed - Cr in normal range and within 360 days    Creat  Date Value Ref Range Status  06/03/2016 0.88 0.70 - 1.33 mg/dL Final    Comment:      For patients > or = 59 years of age: The upper reference limit for Creatinine is approximately 13% higher for people identified as African-American.      Creatinine, Ser  Date Value Ref Range Status  09/20/2017 0.80 0.76 - 1.27 mg/dL Final         Failed - HBA1C is between 0 and 7.9 and within 180 days    Hgb A1c MFr Bld  Date Value Ref Range Status  12/02/2016 9.3 (H) 4.8 - 5.6 % Final    Comment:             Pre-diabetes: 5.7 - 6.4          Diabetes: >6.4          Glycemic control for adults with diabetes: <7.0          Failed - eGFR in normal range and within 360 days    GFR, Est African American  Date Value Ref Range Status  06/03/2016 >89 >=60 mL/min Final   GFR calc Af Amer  Date Value Ref Range Status  09/20/2017 115 >59 mL/min/1.73 Final   GFR, Est Non African American  Date Value Ref Range Status  06/03/2016 >89 >=60 mL/min Final   GFR calc non Af Amer  Date Value Ref Range Status  09/20/2017 99 >59 mL/min/1.73 Final        Failed - Valid encounter within last 6 months    Recent Outpatient Visits          6 months ago Open wound of right foot with complication, initial encounter   [facility], Ranell Patrick, MD   9 months ago Cellulitis of leg, left   [facility], Damaris Hippo, PA-C   9 months ago Cellulitis of leg, left   [facility], Damaris Hippo, PA-C   1 year ago Annual physical exam   [facility], Ranell Patrick, MD   1 year ago Type 2 diabetes mellitus without complication, with long-term current use of insulin [facility])   [facility], Ranell Patrick, MD            atorvastatin (LIPITOR) 20 MG tablet [Pharmacy Med Name: ATORVASTATIN 20 MG  TABLET] 90 tablet 0    Sig: TAKE ONE TABLET BY MOUTH DAILY     Cardiovascular:  Antilipid - Statins Failed - 11/24/2018  9:53 AM      Failed - Total Cholesterol in normal range and within 360 days    Cholesterol, Total  Date Value Ref Range Status  09/20/2017 110 100 - 199 mg/dL Final         Failed - LDL in normal range and within 360 days    LDL Calculated  Date Value Ref Range Status  09/20/2017 50 0 - 99 mg/dL Final         Failed - HDL in normal range and within 360 days    HDL  Date Value Ref Range Status  09/20/2017 29 (L) >39 mg/dL Final         Failed - Triglycerides in normal range and within 360 days    Triglycerides  Date Value Ref Range Status  09/20/2017 153 (H) 0 - 149 mg/dL Final         Passed - Patient is not pregnant      Passed - Valid encounter within last 12 months    Recent Outpatient Visits          6 months ago Open wound of right foot with complication, initial encounter   [facility], Ranell Patrick, MD   9 months ago Cellulitis of leg, left   [facility], Damaris Hippo, PA-C   9 months ago Cellulitis of leg, left   [facility], Damaris Hippo, PA-C   1 year ago Annual physical exam   [facility],  Ranell Patrick, MD   1 year ago Type 2 diabetes mellitus without complication, with long-term current use of insulin [facility])   [facility], Ranell Patrick, MD            lisinopril (ZESTRIL) 20 MG tablet [Pharmacy Med Name: LISINOPRIL 20 MG TABLET] 90 tablet 0    Sig: TAKE ONE TABLET BY MOUTH DAILY     Cardiovascular:  ACE Inhibitors Failed - 11/24/2018  9:53 AM      Failed - Cr in normal range and within 180 days    Creat  Date Value Ref Range Status  06/03/2016 0.88 0.70 - 1.33 mg/dL Final    Comment:      For patients > or = 59 years of age: The upper reference limit for Creatinine is approximately 13% higher for people identified as African-American.      Creatinine, Ser  Date Value Ref Range Status  09/20/2017 0.80 0.76 - 1.27 mg/dL Final         Failed - K in normal range and within 180 days    Potassium  Date Value Ref Range Status  09/20/2017 4.1 3.5 - 5.2 mmol/L Final         Failed - Last BP in normal range    BP Readings from Last 1 Encounters:  08/11/18 (!) 151/86         Failed - Valid encounter within last 6 months    Recent Outpatient Visits          6 months ago Open wound of right foot with complication, initial encounter   [facility], Ranell Patrick, MD   9 months ago Cellulitis of leg, left   [facility], Sarah L, PA-C   9 months ago  Cellulitis of leg, left   [facility], Damaris Hippo, PA-C   1 year ago Annual physical exam   [facility], Ranell Patrick, MD   1 year ago Type 2 diabetes mellitus without complication, with long-term current use of insulin [facility])   [facility], Ranell Patrick, MD             Passed - Patient is not pregnant

## 2018-11-27 NOTE — Telephone Encounter (Signed)
Need an office visit for any further refills

## 2018-12-01 ENCOUNTER — Ambulatory Visit (INDEPENDENT_AMBULATORY_CARE_PROVIDER_SITE_OTHER): Payer: Managed Care, Other (non HMO) | Admitting: Family Medicine

## 2018-12-01 ENCOUNTER — Other Ambulatory Visit: Payer: Self-pay

## 2018-12-01 ENCOUNTER — Encounter: Payer: Self-pay | Admitting: Family Medicine

## 2018-12-01 VITALS — BP 130/76 | HR 82 | Temp 98.4°F | Ht 69.0 in | Wt 301.6 lb

## 2018-12-01 DIAGNOSIS — Z1211 Encounter for screening for malignant neoplasm of colon: Secondary | ICD-10-CM

## 2018-12-01 DIAGNOSIS — E785 Hyperlipidemia, unspecified: Secondary | ICD-10-CM | POA: Diagnosis not present

## 2018-12-01 DIAGNOSIS — I10 Essential (primary) hypertension: Secondary | ICD-10-CM

## 2018-12-01 DIAGNOSIS — E1165 Type 2 diabetes mellitus with hyperglycemia: Secondary | ICD-10-CM | POA: Diagnosis not present

## 2018-12-01 DIAGNOSIS — Z0001 Encounter for general adult medical examination with abnormal findings: Secondary | ICD-10-CM | POA: Diagnosis not present

## 2018-12-01 DIAGNOSIS — Z23 Encounter for immunization: Secondary | ICD-10-CM

## 2018-12-01 DIAGNOSIS — F5104 Psychophysiologic insomnia: Secondary | ICD-10-CM

## 2018-12-01 DIAGNOSIS — R0789 Other chest pain: Secondary | ICD-10-CM | POA: Diagnosis not present

## 2018-12-01 DIAGNOSIS — Z794 Long term (current) use of insulin: Secondary | ICD-10-CM

## 2018-12-01 DIAGNOSIS — Z0000 Encounter for general adult medical examination without abnormal findings: Secondary | ICD-10-CM

## 2018-12-01 DIAGNOSIS — F4323 Adjustment disorder with mixed anxiety and depressed mood: Secondary | ICD-10-CM

## 2018-12-01 MED ORDER — ZOSTER VAC RECOMB ADJUVANTED 50 MCG/0.5ML IM SUSR: 0.5000 mL | Freq: Once | INTRAMUSCULAR | 1 refills | Status: AC

## 2018-12-01 MED ORDER — ATORVASTATIN CALCIUM 20 MG PO TABS: 20.0000 mg | ORAL_TABLET | Freq: Every day | ORAL | 2 refills | Status: DC

## 2018-12-01 MED ORDER — HYDROXYZINE HCL 25 MG PO TABS: 12.5000 mg | ORAL_TABLET | Freq: Three times a day (TID) | ORAL | 0 refills | Status: DC | PRN

## 2018-12-01 MED ORDER — LISINOPRIL 20 MG PO TABS: 20.0000 mg | ORAL_TABLET | Freq: Every day | ORAL | 2 refills | Status: DC

## 2018-12-01 NOTE — Patient Instructions (Addendum)
See info on stress below.  I would also recommend meeting with therapist. Please see number below. Follow up with me in next 4 weeks to discuss further.  [facility]: [phone]  Blood pressure was better on recheck.  No changes for now. We can recheck this at follow up in 4 weeks.   I will refer you to cardiology, but if any new or worsening chest discomfort - call 911 or go to emergency room.   Keeping you healthy  Get these tests  Blood pressure- Have your blood pressure checked once a year by your healthcare provider.  Normal blood pressure is 120/80  Weight- Have your body mass index (BMI) calculated to screen for obesity.  BMI is a measure of body fat based on height and weight. You can also calculate your own BMI at [email or web address].  Cholesterol- Have your cholesterol checked every year.  Diabetes- Have your blood sugar checked regularly if you have high blood pressure, high cholesterol, have a family history of diabetes or if you are overweight.  Screening for Colon Cancer- Colonoscopy starting at age 11.  Screening may begin sooner depending on your family history and other health conditions. Follow up colonoscopy as directed by your Gastroenterologist.  Screening for Prostate Cancer- Both blood work (PSA) and a rectal exam help screen for Prostate Cancer.  Screening begins at age 24 with African-American men and at age 66 with Caucasian men.  Screening may begin sooner depending on your family history.  Take these medicines  Aspirin- One aspirin daily can help prevent Heart disease and Stroke.  Flu shot- Every fall.  Tetanus- Every 10 years.  Zostavax- Once after the age of 34 to prevent Shingles.  Pneumonia shot- Once after the age of 32; if you are younger than 35, ask your healthcare provider if you need a Pneumonia shot.  Take these steps  Don't smoke- If you do smoke, talk to your doctor about quitting.  For tips on how to quit, go to  www.smokefree.gov or call 1-800-QUIT-NOW.  Be physically active- Exercise 5 days a week for at least 30 minutes.  If you are not already physically active start slow and gradually work up to 30 minutes of moderate physical activity.  Examples of moderate activity include walking briskly, mowing the yard, dancing, swimming, bicycling, etc.  Eat a healthy diet- Eat a variety of healthy food such as fruits, vegetables, low fat milk, low fat cheese, yogurt, lean meant, poultry, fish, beans, tofu, etc. For more information go to www.thenutritionsource.org  Drink alcohol in moderation- Limit alcohol intake to less than two drinks a day. Never drink and drive.  Dentist- Brush and floss twice daily; visit your dentist twice a year.  Depression- Your emotional health is as important as your physical health. If you're feeling down, or losing interest in things you would normally enjoy please talk to your healthcare provider.  Eye exam- Visit your eye doctor every year.  Safe sex- If you may be exposed to a sexually transmitted infection, use a condom.  Seat belts- Seat belts can save your life; always wear one.  Smoke/Carbon Monoxide detectors- These detectors need to be installed on the appropriate level of your home.  Replace batteries at least once a year.  Skin cancer- When out in the sun, cover up and use sunscreen 15 SPF or higher.  Violence- If anyone is threatening you, please tell your healthcare provider.  Living Will/ Health care power of attorney- Speak with your healthcare provider  and family.   Nonspecific Chest Pain, Adult Chest pain can be caused by many different conditions. It can be caused by a condition that is life-threatening and requires treatment right away. It can also be caused by something that is not life-threatening. If you have chest pain, it can be hard to know the difference, so it is important to get help right away to make sure that you do not have a serious  condition. Some life-threatening causes of chest pain include:  Heart attack.  A tear in the body's main blood vessel (aortic dissection).  Inflammation around your heart (pericarditis).  A problem in the lungs, such as a blood clot (pulmonary embolism) or a collapsed lung (pneumothorax). Some non life-threatening causes of chest pain include:  Heartburn.  Anxiety or stress.  Damage to the bones, muscles, and cartilage that make up your chest wall.  Pneumonia or bronchitis.  Shingles infection (varicella-zoster virus). Chest pain can feel like:  Pain or discomfort on the surface of your chest or deep in your chest.  Crushing, pressure, aching, or squeezing pain.  Burning or tingling.  Dull or sharp pain that is worse when you move, cough, or take a deep breath.  Pain or discomfort that is also felt in your back, neck, jaw, shoulder, or arm, or pain that spreads to any of these areas. Your chest pain may come and go. It may also be constant. Your health care provider will do lab tests and other studies to find the cause of your pain. Treatment will depend on the cause of your chest pain. Follow these instructions at home: Medicines  Take over-the-counter and prescription medicines only as told by your health care provider.  If you were prescribed an antibiotic, take it as told by your health care provider. Do not stop taking the antibiotic even if you start to feel better. Lifestyle   Rest as directed by your health care provider.  Do not use any products that contain nicotine or tobacco, such as cigarettes and e-cigarettes. If you need help quitting, ask your health care provider.  Do not drink alcohol.  Make healthy lifestyle choices as recommended. These may include: ? Getting regular exercise. Ask your health care provider to suggest some activities that are safe for you. ? Eating a heart-healthy diet. This includes plenty of fresh fruits and vegetables, whole  grains, low-fat (lean) protein, and low-fat dairy products. A dietitian can help you find healthy eating options. ? Maintaining a healthy weight. ? Managing any other health conditions you have, such as high blood pressure (hypertension) or diabetes. ? Reducing stress, such as with yoga or relaxation techniques. General instructions  Pay attention to any changes in your symptoms. Tell your health care provider about them or any new symptoms.  Avoid any activities that cause chest pain.  Keep all follow-up visits as told by your health care provider. This is important. This includes visits for any further testing if your chest pain does not go away. Contact a health care provider if:  Your chest pain does not go away.  You feel depressed.  You have a fever. Get help right away if:  Your chest pain gets worse.  You have a cough that gets worse, or you cough up blood.  You have severe pain in your abdomen.  You faint.  You have sudden, unexplained chest discomfort.  You have sudden, unexplained discomfort in your arms, back, neck, or jaw.  You have shortness of breath at any  time.  You suddenly start to sweat, or your skin gets clammy.  You feel nausea or you vomit.  You suddenly feel lightheaded or dizzy.  You have severe weakness, or unexplained weakness or fatigue.  Your heart begins to beat quickly, or it feels like it is skipping beats. These symptoms may represent a serious problem that is an emergency. Do not wait to see if the symptoms will go away. Get medical help right away. Call your local emergency services (911 in the [place] Do not drive yourself to the hospital. Summary  Chest pain can be caused by a condition that is serious and requires urgent treatment. It may also be caused by something that is not life-threatening.  If you have chest pain, it is very important to see your health care provider. Your health care provider may do lab tests and other  studies to find the cause of your pain.  Follow your health care provider's instructions on taking medicines, making lifestyle changes, and getting emergency treatment if symptoms become worse.  Keep all follow-up visits as told by your health care provider. This includes visits for any further testing if your chest pain does not go away. This information is not intended to replace advice given to you by your health care provider. Make sure you discuss any questions you have with your health care provider. Document Released: 04/14/2005 Document Revised: 01/05/2018 Document Reviewed: 01/05/2018 Elsevier Interactive Patient Education  2019 [employer].    Adjustment Disorder, Adult Adjustment disorder is a group of symptoms that can develop after a stressful life event, such as the loss of a job or serious physical illness. The symptoms can affect how you feel, think, and act. They may interfere with your relationships. Adjustment disorder increases your risk of suicide and substance abuse. If this disorder is not managed early, it can develop into a more serious condition, such as major depressive disorder or post-traumatic stress disorder. What are the causes? This condition happens when you have trouble recovering from or coping with a stressful life event. What increases the risk? You are more likely to develop this condition if:  You have had depression or anxiety.  You are being treated for a long-term (chronic) illness.  You are being treated for an illness that cannot be cured (terminal illness).  You have a family history of mental illness. What are the signs or symptoms? Symptoms of this condition include:  Extreme trouble doing daily tasks, such as going to work.  Sadness, depression, or crying spells.  Worrying a lot.  Loss of enjoyment.  Change in appetite or weight.  Feelings of loss or hopelessness.  Thoughts of suicide.  Anxiety, worry, or nervousness.   Trouble sleeping.  Avoiding family and friends.  Fighting or vandalism.  Complaining of feeling sick without being ill.  Feeling dazed or disconnected.  Nightmares.  Trouble sleeping.  Irritability.  Reckless driving.  Poor work Systems analyst.  Ignoring bills. Symptoms of this condition start within three months of the stressful event. They do not last more than six months, unless the stressful circumstances last longer. Normal grieving after the death of a loved one is not a symptom of this condition. How is this diagnosed? To diagnose this condition, your health care provider will ask about what has happened in your life and how it has affected you. He or she may also ask about your medical history and your use of medicines, alcohol, and other substances. Your health care provider may do  a physical exam and order lab tests or other studies. You may be referred to a mental health specialist. How is this treated? Treatment options for this condition include:  Counseling or talk therapy. Talk therapy is usually provided by mental health specialists.  Medicines. Certain medicines may help with depression, anxiety, and sleep.  Support groups. These offer emotional support, advice, and guidance. They are made up of people who have had similar experiences.  Observation and time. This is sometimes called "watchful waiting." In this treatment, health care providers monitor your health and behavior without other treatment. Adjustment disorder sometimes gets better on its own with time. Follow these instructions at home:  Take over-the-counter and prescription medicines only as told by your health care provider.  Keep all follow-up visits as told by your health care provider. This is important. Contact a health care provider if:  Your symptoms do not improve in six months.  Your symptoms get worse. Get help right away if:  You have serious thoughts about hurting yourself or someone  else. If you ever feel like you may hurt yourself or others, or have thoughts about taking your own life, get help right away. You can go to your nearest emergency department or call:  Your local emergency services (911 in the [place]  A suicide crisis helpline, such as the [facility] at 806 332 2947. This is open 24 hours a day. Summary  Adjustment disorder is a group of symptoms that can develop after a stressful life event, such as the loss of a job or serious physical illness. The symptoms can affect how you feel, think, and act. They may interfere with your relationships.  Symptoms of this condition start within three months of the stressful event. They do not last more than six months, unless the stressful circumstances last longer.  Treatment may include talk therapy, medicines, participation in a support group, or observation to see if symptoms improve.  Contact your health care provider if your symptoms get worse or do not improve in six months.  If you ever feel like you may hurt yourself or others, or have thoughts about taking your own life, get help right away. This information is not intended to replace advice given to you by your health care provider. Make sure you discuss any questions you have with your health care provider. Document Released: 03/09/2006 Document Revised: 09/03/2016 Document Reviewed: 09/03/2016 Elsevier Interactive Patient Education  2019 [place] is a normal reaction to life events. Stress is what you feel when life demands more than you are used to, or more than you think you can handle. Some stress can be useful, such as studying for a test or meeting a deadline at work. Stress that occurs too often or for too long can cause problems. It can affect your emotional health and interfere with relationships and normal daily activities. Too much stress can weaken your body's defense system (immune system) and  increase your risk for physical illness. If you already have a medical problem, stress can make it worse. What are the causes? All sorts of life events can cause stress. An event that causes stress for one person may not be stressful for another person. Major life events, whether positive or negative, commonly cause stress. Examples include:  Losing a job or starting a new job.  Losing a loved one.  Moving to a new town or home.  Getting married or divorced.  Having a baby.  Injury  or illness. Less obvious life events can also cause stress, especially if they occur day after day or in combination with each other. Examples include:  Working long hours.  Driving in traffic.  Caring for children.  Being in debt.  Being in a difficult relationship. What are the signs or symptoms? Stress can cause emotional symptoms, including:  Anxiety. This is feeling worried, afraid, on edge, overwhelmed, or out of control.  Anger, including irritation or impatience.  Depression. This is feeling sad, down, helpless, or guilty.  Trouble focusing, remembering, or making decisions. Stress can cause physical symptoms, including:  Aches and pains. These may affect your head, neck, back, stomach, or other areas of your body.  Tight muscles or a clenched jaw.  Low energy.  Trouble sleeping. Stress can cause unhealthy behaviors, including:  Eating to feel better (overeating) or skipping meals.  Working too much or putting off tasks.  Smoking, drinking alcohol, or using drugs to feel better. How is this diagnosed? Stress is diagnosed through an assessment by your health care provider. He or she may diagnose this condition based on:  Your symptoms and any stressful life events.  Your medical history.  Tests to rule out other causes of your symptoms. Depending on your condition, your health care provider may refer you to a specialist for further evaluation. How is this treated?   Stress management techniques are the recommended treatment for stress. Medicine is not typically recommended for the treatment of stress. Techniques to reduce your reaction to stressful life events include:  Stress identification. Monitor yourself for symptoms of stress and identify what causes stress for you. These skills may help you to avoid or prepare for stressful events.  Time management. Set your priorities, keep a calendar of events, and learn to say "no." Taking these actions can help you avoid making too many commitments. Techniques for coping with stress include:  Rethinking the problem. Try to think realistically about stressful events rather than ignoring them or overreacting. Try to find the positives in a stressful situation rather than focusing on the negatives.  Exercise. Physical exercise can release both physical and emotional tension. The key is to find a form of exercise that you enjoy and do it regularly.  Relaxation techniques. These relax the body and mind. The key is to find one or more that you enjoy and use the technique(s) regularly. Examples include: ? Meditation, deep breathing, or progressive relaxation techniques. ? Yoga or tai chi. ? Biofeedback, mindfulness techniques, or journaling. ? Listening to music, being out in nature, or participating in other hobbies.  Practicing a healthy lifestyle. Eat a balanced diet, drink plenty of water, limit or avoid caffeine, and get plenty of sleep.  Having a strong support network. Spend time with family, friends, or other people you enjoy being around. Express your feelings and talk things over with someone you trust. Counseling or talk therapy with a mental health professional may be helpful if you are having trouble managing stress on your own. Follow these instructions at home: Lifestyle   Avoid drugs.  Do not use any products that contain nicotine or tobacco, such as cigarettes and e-cigarettes. If you need help  quitting, ask your health care provider.  Limit alcohol intake to no more than 1 drink a day for nonpregnant women and 2 drinks a day for men. One drink equals 12 oz of beer, 5 oz of wine, or 1 oz of hard liquor.  Do not use alcohol or drugs to  relax.  Eat a balanced diet that includes fresh fruits and vegetables, whole grains, lean meats, fish, eggs, and beans, and low-fat dairy. Avoid processed foods and foods high in added fat, sugar, and salt.  Exercise at least 30 minutes on 5 or more days each week.  Get 7-8 hours of sleep each night. General instructions   Practice stress management techniques as discussed with your health care provider.  Drink enough fluid to keep your urine clear or pale yellow.  Take over-the-counter and prescription medicines only as told by your health care provider.  Keep all follow-up visits as told by your health care provider. This is important. Contact a health care provider if:  Your symptoms get worse.  You have new symptoms.  You feel overwhelmed by your problems and can no longer manage them on your own. Get help right away if:  You have thoughts of hurting yourself or others. If you ever feel like you may hurt yourself or others, or have thoughts about taking your own life, get help right away. You can go to your nearest emergency department or call:  Your local emergency services (911 in the [place]  A suicide crisis helpline, such as the [facility] at 4840319475. This is open 24 hours a day. Summary  Stress is a normal reaction to life events. It can cause problems if it happens too often or for too long.  Practicing stress management techniques is the best way to treat stress.  Counseling or talk therapy with a mental health professional may be helpful if you are having trouble managing stress on your own. This information is not intended to replace advice given to you by your health care provider. Make  sure you discuss any questions you have with your health care provider. Document Released: 12/29/2000 Document Revised: 08/25/2016 Document Reviewed: 08/25/2016 Elsevier Interactive Patient Education  2019 [employer].

## 2018-12-01 NOTE — Progress Notes (Signed)
Subjective:    Patient ID: Brandon Rich, male    DOB: 10/13/1960, 58 y.o.   MRN: [id number]  HPI [name] is a 58 y.o. male Presents today for: Chief Complaint  Patient presents with   Annual Exam    CPE   Depression    PHQ9=16 (feeling hopeless & stress with life/work)    Presents for physical.  History of hypertension, dyslipidemia, diabetes, glaucoma.   Has recently been treated for a skin ulcer of his left great toe, Dr. Clyde Canterbury, last appointment yesterday.  Podiatrist Dr. March Rummage.  Has been treated for osteomyelitis in October.  Diabetes: Followed by endocrinology, Dr. Buddy Duty. Has appt this afternoon.  Diabetes has been improving.   Hypertension: BP Readings from Last 3 Encounters:  12/01/18 (!) 148/86  08/11/18 (!) 151/86  05/13/18 140/77   Lab Results  Component Value Date   CREATININE 0.80 09/20/2017  no missed doses of meds. Lisinopril 24m qd. Running higher past 6 months. Home readings 150-160/90.   Wt Readings from Last 3 Encounters:  12/01/18 (!) 301 lb 9.6 oz (136.8 kg)  05/12/18 (!) 304 lb 9.6 oz (138.2 kg)  02/15/18 (!) 303 lb 6.4 oz (137.6 kg)    Chest discomfort: Notes during day at times - during activity. Past few months. Dull discomfort. Lasts for seconds. No associated n/v, dyspnea, no radiation. Improves with taking a deep sigh. No recent stress testing.   Hyperlipidemia:  Lab Results  Component Value Date   CHOL 110 09/20/2017   HDL 29 (L) 09/20/2017   LDLCALC 50 09/20/2017   TRIG 153 (H) 09/20/2017   CHOLHDL 3.8 09/20/2017   Lab Results  Component Value Date   ALT 18 09/20/2017   AST 40 09/20/2017   ALKPHOS 67 09/20/2017   BILITOT 0.4 09/20/2017  Lipitor 20 mg daily.    Cancer screening: Colonoscopy: Discussed last year, recommended scheduling colonoscopy.  Last one in May 2005. Has not had. Work schedule has been limiting. Agrees to cologuard.   Prostate cancer screening -PSA 1.3 in March 2019.  Family history of  prostate cancer in his father. Agrees to testing today.   Immunization History  Administered Date(s) Administered   Influenza Split 04/04/2012, 04/14/2013   Influenza Whole 09/05/2017   Influenza,inj,Quad PF,6+ Mos 04/14/2013, 04/24/2014, 06/16/2015, 03/27/2016, 04/16/2017, 04/10/2018   Influenza-Unspecified 04/16/2017   Pneumococcal Conjugate-13 06/16/2015   Pneumococcal Polysaccharide-23 07/19/2006, 06/06/2014   Tdap 12/16/2016  Shingles: has not had.   Depression screening: Does report some increased depressive symptoms, stress with work. Combination of stressors. 9 months of working out of state and gone during week. Overwhelming project. Not sleeping well. Gets to sleep ok. Some relationship concerns. Has felt most stress past month or two. Prior stress, but was managing.  Sleeping for a few hours at a time - mind racing.  Using CPAP. Slept more at home. No regular exercise - limited with foot issue. Wearing walking boot.  No suicidal ideation. Fleeting thoughts of "what's going to get better?"  Some anxiety in 2003 - talked to counselor once.  Alcohol: none.  Coping technique: zone out, watch tv, try to forget that is happening.    [facility] 12/01/2018 05/12/2018 02/15/2018 02/04/2018 09/20/2017  Decreased Interest 3 0 0 0 0  Down, Depressed, Hopeless 3 0 0 0 0  PHQ - 2 Score 6 0 0 0 0  Altered sleeping 3 - - - -  Tired, decreased energy 2 - - - -  Change  in appetite 2 - - - -  Feeling bad or failure about yourself  3 - - - -  Trouble concentrating 0 - - - -  Moving slowly or fidgety/restless 0 - - - -  Suicidal thoughts 0 - - - -  PHQ-9 Score 16 - - - -  Difficult doing work/chores Very difficult - - - -    Visual Acuity Screening   Right eye Left eye Both eyes  Without correction:     With correction: 20/30 20/30 20/25   last optho in 03/2018. Glaucoma specialist. glaucoms stable on gtts.   Dentist: every 6 months.   Exercise: minimal - limited with  foot.  Discussed pool based exercise when able.   Review of Systems  Constitutional: Negative for fatigue and unexpected weight change.  Eyes: Negative for visual disturbance.  Respiratory: Positive for chest tightness (discomfort. ). Negative for cough and shortness of breath.   Cardiovascular: Negative for chest pain, palpitations and leg swelling.  Gastrointestinal: Negative for abdominal pain and blood in stool.  Neurological: Negative for dizziness, light-headedness and headaches.   Per HPI.      Objective:   Physical Exam Vitals signs reviewed.  Constitutional:      General: He is not in acute distress.    Appearance: He is well-developed. He is obese. He is not ill-appearing or diaphoretic.  HENT:     Head: Normocephalic and atraumatic.     Right Ear: External ear normal.     Left Ear: External ear normal.  Eyes:     Conjunctiva/sclera: Conjunctivae normal.     Pupils: Pupils are equal, round, and reactive to light.  Neck:     Musculoskeletal: Normal range of motion and neck supple.     Thyroid: No thyromegaly.  Cardiovascular:     Rate and Rhythm: Normal rate and regular rhythm.     Heart sounds: Normal heart sounds. No murmur.  Pulmonary:     Effort: Pulmonary effort is normal. No respiratory distress.     Breath sounds: Normal breath sounds. No wheezing.  Abdominal:     General: There is no distension.     Palpations: Abdomen is soft.     Tenderness: There is no abdominal tenderness.     Hernia: There is no hernia in the right inguinal area or left inguinal area.  Genitourinary:    Prostate: Normal.  Musculoskeletal: Normal range of motion.        General: No tenderness.  Lymphadenopathy:     Cervical: No cervical adenopathy.  Skin:    General: Skin is warm and dry.  Neurological:     Mental Status: He is alert and oriented to person, place, and time.     Deep Tendon Reflexes: Reflexes are normal and symmetric.  Psychiatric:        Mood and Affect: Mood  normal.        Behavior: Behavior normal.        Thought Content: Thought content normal.   EKG: SR, no apparent change from 2017.    Vitals:   12/01/18 0836 12/01/18 0852  BP: (!) 148/86 130/76  Pulse: 82   Temp: 98.4 F (36.9 C)   TempSrc: Oral   SpO2: 95%   Weight: (!) 301 lb 9.6 oz (136.8 kg)   Height: 5' 9"  (1.753 m)        Assessment & Plan:    LAMAR METER is a 58 y.o. male Annual physical exam - Plan: PSA, Lipid  panel, Comprehensive metabolic panel  - -anticipatory guidance as below in AVS, screening labs above. Health maintenance items as above in HPI discussed/recommended as applicable.   Type 2 diabetes mellitus with hyperglycemia, with long-term current use of insulin ([place])  -Continue routine follow-up with endocrinology, reported improved control with adjustment in meds.  Hyperlipidemia, unspecified hyperlipidemia type - Plan: Lipid panel, Comprehensive metabolic panel, atorvastatin (LIPITOR) 20 MG tablet  -Tolerating current statin, continue Lipitor same dose, check labs  Psychophysiological insomnia - Plan: hydrOXYzine (ATARAX/VISTARIL) 25 MG tablet Adjustment disorder with mixed anxiety and depressed mood - Plan: hydrOXYzine (ATARAX/VISTARIL) 25 MG tablet  -Handout given on stress and adjustment disorder, but did recommend meeting with therapist, phone number provided.  Follow-up in 3 weeks to check status.  RTC/ER precautions if acute worsening  Chest discomfort - Plan: Ambulatory referral to Cardiology, EKG 12-Lead  -No apparent changes in EKG.  Fleeting symptoms.  We will have her evaluated by cardiology, but ER/normal precautions if more persistent/worsening  Essential hypertension - Plan: lisinopril (ZESTRIL) 20 MG tablet, atorvastatin (LIPITOR) 20 MG tablet  -Improved on recheck, continue same regimen.  Screen for colon cancer - Plan: Cologuard  -Options of colonoscopy versus Cologuard were discussed including with limitations, and possible need  for diagnostic colonoscopy if positive.  Understanding expressed, chose Cologuard  Need for shingles vaccine - Plan: Zoster Vaccine Adjuvanted [facility]) injection to pharmacy   Meds ordered this encounter  Medications   hydrOXYzine (ATARAX/VISTARIL) 25 MG tablet    Sig: Take 0.5-1 tablets (12.5-25 mg total) by mouth 3 (three) times daily as needed for anxiety (or sleep).    Dispense:  30 tablet    Refill:  0   Zoster Vaccine Adjuvanted [facility]) injection    Sig: Inject 0.5 mLs into the muscle once for 1 dose. Repeat in 2-6 months.    Dispense:  0.5 mL    Refill:  1   lisinopril (ZESTRIL) 20 MG tablet    Sig: Take 1 tablet (20 mg total) by mouth daily.    Dispense:  90 tablet    Refill:  2   atorvastatin (LIPITOR) 20 MG tablet    Sig: Take 1 tablet (20 mg total) by mouth daily.    Dispense:  90 tablet    Refill:  2   Patient Instructions  See info on stress below.  I would also recommend meeting with therapist. Please see number below. Follow up with me in next 4 weeks to discuss further.  [facility]: [phone]  Blood pressure was better on recheck.  No changes for now. We can recheck this at follow up in 4 weeks.   I will refer you to cardiology, but if any new or worsening chest discomfort - call 911 or go to emergency room.   Keeping you healthy  Get these tests  Blood pressure- Have your blood pressure checked once a year by your healthcare provider.  Normal blood pressure is 120/80  Weight- Have your body mass index (BMI) calculated to screen for obesity.  BMI is a measure of body fat based on height and weight. You can also calculate your own BMI at [email or web address].  Cholesterol- Have your cholesterol checked every year.  Diabetes- Have your blood sugar checked regularly if you have high blood pressure, high cholesterol, have a family history of diabetes or if you are overweight.  Screening for Colon Cancer- Colonoscopy starting  at age 83.  Screening may begin sooner depending on your family history and other  health conditions. Follow up colonoscopy as directed by your Gastroenterologist.  Screening for Prostate Cancer- Both blood work (PSA) and a rectal exam help screen for Prostate Cancer.  Screening begins at age 19 with African-American men and at age 31 with Caucasian men.  Screening may begin sooner depending on your family history.  Take these medicines  Aspirin- One aspirin daily can help prevent Heart disease and Stroke.  Flu shot- Every fall.  Tetanus- Every 10 years.  Zostavax- Once after the age of 4 to prevent Shingles.  Pneumonia shot- Once after the age of 24; if you are younger than 41, ask your healthcare provider if you need a Pneumonia shot.  Take these steps  Don't smoke- If you do smoke, talk to your doctor about quitting.  For tips on how to quit, go to www.smokefree.gov or call 1-800-QUIT-NOW.  Be physically active- Exercise 5 days a week for at least 30 minutes.  If you are not already physically active start slow and gradually work up to 30 minutes of moderate physical activity.  Examples of moderate activity include walking briskly, mowing the yard, dancing, swimming, bicycling, etc.  Eat a healthy diet- Eat a variety of healthy food such as fruits, vegetables, low fat milk, low fat cheese, yogurt, lean meant, poultry, fish, beans, tofu, etc. For more information go to www.thenutritionsource.org  Drink alcohol in moderation- Limit alcohol intake to less than two drinks a day. Never drink and drive.  Dentist- Brush and floss twice daily; visit your dentist twice a year.  Depression- Your emotional health is as important as your physical health. If you're feeling down, or losing interest in things you would normally enjoy please talk to your healthcare provider.  Eye exam- Visit your eye doctor every year.  Safe sex- If you may be exposed to a sexually transmitted infection, use a  condom.  Seat belts- Seat belts can save your life; always wear one.  Smoke/Carbon Monoxide detectors- These detectors need to be installed on the appropriate level of your home.  Replace batteries at least once a year.  Skin cancer- When out in the sun, cover up and use sunscreen 15 SPF or higher.  Violence- If anyone is threatening you, please tell your healthcare provider.  Living Will/ Health care power of attorney- Speak with your healthcare provider and family.   Nonspecific Chest Pain, Adult Chest pain can be caused by many different conditions. It can be caused by a condition that is life-threatening and requires treatment right away. It can also be caused by something that is not life-threatening. If you have chest pain, it can be hard to know the difference, so it is important to get help right away to make sure that you do not have a serious condition. Some life-threatening causes of chest pain include:  Heart attack.  A tear in the body's main blood vessel (aortic dissection).  Inflammation around your heart (pericarditis).  A problem in the lungs, such as a blood clot (pulmonary embolism) or a collapsed lung (pneumothorax). Some non life-threatening causes of chest pain include:  Heartburn.  Anxiety or stress.  Damage to the bones, muscles, and cartilage that make up your chest wall.  Pneumonia or bronchitis.  Shingles infection (varicella-zoster virus). Chest pain can feel like:  Pain or discomfort on the surface of your chest or deep in your chest.  Crushing, pressure, aching, or squeezing pain.  Burning or tingling.  Dull or sharp pain that is worse when you move, cough,  or take a deep breath.  Pain or discomfort that is also felt in your back, neck, jaw, shoulder, or arm, or pain that spreads to any of these areas. Your chest pain may come and go. It may also be constant. Your health care provider will do lab tests and other studies to find the cause of  your pain. Treatment will depend on the cause of your chest pain. Follow these instructions at home: Medicines  Take over-the-counter and prescription medicines only as told by your health care provider.  If you were prescribed an antibiotic, take it as told by your health care provider. Do not stop taking the antibiotic even if you start to feel better. Lifestyle   Rest as directed by your health care provider.  Do not use any products that contain nicotine or tobacco, such as cigarettes and e-cigarettes. If you need help quitting, ask your health care provider.  Do not drink alcohol.  Make healthy lifestyle choices as recommended. These may include: ? Getting regular exercise. Ask your health care provider to suggest some activities that are safe for you. ? Eating a heart-healthy diet. This includes plenty of fresh fruits and vegetables, whole grains, low-fat (lean) protein, and low-fat dairy products. A dietitian can help you find healthy eating options. ? Maintaining a healthy weight. ? Managing any other health conditions you have, such as high blood pressure (hypertension) or diabetes. ? Reducing stress, such as with yoga or relaxation techniques. General instructions  Pay attention to any changes in your symptoms. Tell your health care provider about them or any new symptoms.  Avoid any activities that cause chest pain.  Keep all follow-up visits as told by your health care provider. This is important. This includes visits for any further testing if your chest pain does not go away. Contact a health care provider if:  Your chest pain does not go away.  You feel depressed.  You have a fever. Get help right away if:  Your chest pain gets worse.  You have a cough that gets worse, or you cough up blood.  You have severe pain in your abdomen.  You faint.  You have sudden, unexplained chest discomfort.  You have sudden, unexplained discomfort in your arms, back, neck,  or jaw.  You have shortness of breath at any time.  You suddenly start to sweat, or your skin gets clammy.  You feel nausea or you vomit.  You suddenly feel lightheaded or dizzy.  You have severe weakness, or unexplained weakness or fatigue.  Your heart begins to beat quickly, or it feels like it is skipping beats. These symptoms may represent a serious problem that is an emergency. Do not wait to see if the symptoms will go away. Get medical help right away. Call your local emergency services (911 in the [place] Do not drive yourself to the hospital. Summary  Chest pain can be caused by a condition that is serious and requires urgent treatment. It may also be caused by something that is not life-threatening.  If you have chest pain, it is very important to see your health care provider. Your health care provider may do lab tests and other studies to find the cause of your pain.  Follow your health care provider's instructions on taking medicines, making lifestyle changes, and getting emergency treatment if symptoms become worse.  Keep all follow-up visits as told by your health care provider. This includes visits for any further testing if your chest pain does  not go away. This information is not intended to replace advice given to you by your health care provider. Make sure you discuss any questions you have with your health care provider. Document Released: 04/14/2005 Document Revised: 01/05/2018 Document Reviewed: 01/05/2018 Elsevier Interactive Patient Education  2019 [employer].    Adjustment Disorder, Adult Adjustment disorder is a group of symptoms that can develop after a stressful life event, such as the loss of a job or serious physical illness. The symptoms can affect how you feel, think, and act. They may interfere with your relationships. Adjustment disorder increases your risk of suicide and substance abuse. If this disorder is not managed early, it can develop into a  more serious condition, such as major depressive disorder or post-traumatic stress disorder. What are the causes? This condition happens when you have trouble recovering from or coping with a stressful life event. What increases the risk? You are more likely to develop this condition if:  You have had depression or anxiety.  You are being treated for a long-term (chronic) illness.  You are being treated for an illness that cannot be cured (terminal illness).  You have a family history of mental illness. What are the signs or symptoms? Symptoms of this condition include:  Extreme trouble doing daily tasks, such as going to work.  Sadness, depression, or crying spells.  Worrying a lot.  Loss of enjoyment.  Change in appetite or weight.  Feelings of loss or hopelessness.  Thoughts of suicide.  Anxiety, worry, or nervousness.  Trouble sleeping.  Avoiding family and friends.  Fighting or vandalism.  Complaining of feeling sick without being ill.  Feeling dazed or disconnected.  Nightmares.  Trouble sleeping.  Irritability.  Reckless driving.  Poor work Systems analyst.  Ignoring bills. Symptoms of this condition start within three months of the stressful event. They do not last more than six months, unless the stressful circumstances last longer. Normal grieving after the death of a loved one is not a symptom of this condition. How is this diagnosed? To diagnose this condition, your health care provider will ask about what has happened in your life and how it has affected you. He or she may also ask about your medical history and your use of medicines, alcohol, and other substances. Your health care provider may do a physical exam and order lab tests or other studies. You may be referred to a mental health specialist. How is this treated? Treatment options for this condition include:  Counseling or talk therapy. Talk therapy is usually provided by mental health  specialists.  Medicines. Certain medicines may help with depression, anxiety, and sleep.  Support groups. These offer emotional support, advice, and guidance. They are made up of people who have had similar experiences.  Observation and time. This is sometimes called "watchful waiting." In this treatment, health care providers monitor your health and behavior without other treatment. Adjustment disorder sometimes gets better on its own with time. Follow these instructions at home:  Take over-the-counter and prescription medicines only as told by your health care provider.  Keep all follow-up visits as told by your health care provider. This is important. Contact a health care provider if:  Your symptoms do not improve in six months.  Your symptoms get worse. Get help right away if:  You have serious thoughts about hurting yourself or someone else. If you ever feel like you may hurt yourself or others, or have thoughts about taking your own life, get help right  away. You can go to your nearest emergency department or call:  Your local emergency services (911 in the [place]  A suicide crisis helpline, such as the [place] at (575)580-5391. This is open 24 hours a day. Summary  Adjustment disorder is a group of symptoms that can develop after a stressful life event, such as the loss of a job or serious physical illness. The symptoms can affect how you feel, think, and act. They may interfere with your relationships.  Symptoms of this condition start within three months of the stressful event. They do not last more than six months, unless the stressful circumstances last longer.  Treatment may include talk therapy, medicines, participation in a support group, or observation to see if symptoms improve.  Contact your health care provider if your symptoms get worse or do not improve in six months.  If you ever feel like you may hurt yourself or others, or have  thoughts about taking your own life, get help right away. This information is not intended to replace advice given to you by your health care provider. Make sure you discuss any questions you have with your health care provider. Document Released: 03/09/2006 Document Revised: 09/03/2016 Document Reviewed: 09/03/2016 Elsevier Interactive Patient Education  2019 [place] is a normal reaction to life events. Stress is what you feel when life demands more than you are used to, or more than you think you can handle. Some stress can be useful, such as studying for a test or meeting a deadline at work. Stress that occurs too often or for too long can cause problems. It can affect your emotional health and interfere with relationships and normal daily activities. Too much stress can weaken your body's defense system (immune system) and increase your risk for physical illness. If you already have a medical problem, stress can make it worse. What are the causes? All sorts of life events can cause stress. An event that causes stress for one person may not be stressful for another person. Major life events, whether positive or negative, commonly cause stress. Examples include:  Losing a job or starting a new job.  Losing a loved one.  Moving to a new town or home.  Getting married or divorced.  Having a baby.  Injury or illness. Less obvious life events can also cause stress, especially if they occur day after day or in combination with each other. Examples include:  Working long hours.  Driving in traffic.  Caring for children.  Being in debt.  Being in a difficult relationship. What are the signs or symptoms? Stress can cause emotional symptoms, including:  Anxiety. This is feeling worried, afraid, on edge, overwhelmed, or out of control.  Anger, including irritation or impatience.  Depression. This is feeling sad, down, helpless, or guilty.  Trouble focusing,  remembering, or making decisions. Stress can cause physical symptoms, including:  Aches and pains. These may affect your head, neck, back, stomach, or other areas of your body.  Tight muscles or a clenched jaw.  Low energy.  Trouble sleeping. Stress can cause unhealthy behaviors, including:  Eating to feel better (overeating) or skipping meals.  Working too much or putting off tasks.  Smoking, drinking alcohol, or using drugs to feel better. How is this diagnosed? Stress is diagnosed through an assessment by your health care provider. He or she may diagnose this condition based on:  Your symptoms and any stressful life events.  Your medical  history.  Tests to rule out other causes of your symptoms. Depending on your condition, your health care provider may refer you to a specialist for further evaluation. How is this treated?  Stress management techniques are the recommended treatment for stress. Medicine is not typically recommended for the treatment of stress. Techniques to reduce your reaction to stressful life events include:  Stress identification. Monitor yourself for symptoms of stress and identify what causes stress for you. These skills may help you to avoid or prepare for stressful events.  Time management. Set your priorities, keep a calendar of events, and learn to say no. Taking these actions can help you avoid making too many commitments. Techniques for coping with stress include:  Rethinking the problem. Try to think realistically about stressful events rather than ignoring them or overreacting. Try to find the positives in a stressful situation rather than focusing on the negatives.  Exercise. Physical exercise can release both physical and emotional tension. The key is to find a form of exercise that you enjoy and do it regularly.  Relaxation techniques. These relax the body and mind. The key is to find one or more that you enjoy and use the technique(s)  regularly. Examples include: ? Meditation, deep breathing, or progressive relaxation techniques. ? Yoga or tai chi. ? Biofeedback, mindfulness techniques, or journaling. ? Listening to music, being out in nature, or participating in other hobbies.  Practicing a healthy lifestyle. Eat a balanced diet, drink plenty of water, limit or avoid caffeine, and get plenty of sleep.  Having a strong support network. Spend time with family, friends, or other people you enjoy being around. Express your feelings and talk things over with someone you trust. Counseling or talk therapy with a mental health professional may be helpful if you are having trouble managing stress on your own. Follow these instructions at home: Lifestyle   Avoid drugs.  Do not use any products that contain nicotine or tobacco, such as cigarettes and e-cigarettes. If you need help quitting, ask your health care provider.  Limit alcohol intake to no more than 1 drink a day for nonpregnant women and 2 drinks a day for men. One drink equals 12 oz of beer, 5 oz of wine, or 1 oz of hard liquor.  Do not use alcohol or drugs to relax.  Eat a balanced diet that includes fresh fruits and vegetables, whole grains, lean meats, fish, eggs, and beans, and low-fat dairy. Avoid processed foods and foods high in added fat, sugar, and salt.  Exercise at least 30 minutes on 5 or more days each week.  Get 7-8 hours of sleep each night. General instructions   Practice stress management techniques as discussed with your health care provider.  Drink enough fluid to keep your urine clear or pale yellow.  Take over-the-counter and prescription medicines only as told by your health care provider.  Keep all follow-up visits as told by your health care provider. This is important. Contact a health care provider if:  Your symptoms get worse.  You have new symptoms.  You feel overwhelmed by your problems and can no longer manage them on your  own. Get help right away if:  You have thoughts of hurting yourself or others. If you ever feel like you may hurt yourself or others, or have thoughts about taking your own life, get help right away. You can go to your nearest emergency department or call:  Your local emergency services (911 in the [place]  A suicide crisis helpline, such as the [facility] at 561-100-7462. This is open 24 hours a day. Summary  Stress is a normal reaction to life events. It can cause problems if it happens too often or for too long.  Practicing stress management techniques is the best way to treat stress.  Counseling or talk therapy with a mental health professional may be helpful if you are having trouble managing stress on your own. This information is not intended to replace advice given to you by your health care provider. Make sure you discuss any questions you have with your health care provider. Document Released: 12/29/2000 Document Revised: 08/25/2016 Document Reviewed: 08/25/2016 Elsevier Interactive Patient Education  2019 [employer].   Signed,   [name], MD [facility] [place].  12/10/18 9:53 PM

## 2018-12-02 LAB — COMPREHENSIVE METABOLIC PANEL
ALT: 26 IU/L (ref 0–44)
AST: 44 IU/L — ABNORMAL HIGH (ref 0–40)
Albumin/Globulin Ratio: 1.8 (ref 1.2–2.2)
Albumin: 4.6 g/dL (ref 3.8–4.9)
Alkaline Phosphatase: 68 IU/L (ref 39–117)
BUN/Creatinine Ratio: 15 (ref 9–20)
BUN: 16 mg/dL (ref 6–24)
Bilirubin Total: 0.5 mg/dL (ref 0.0–1.2)
CO2: 19 mmol/L — ABNORMAL LOW (ref 20–29)
Calcium: 9.4 mg/dL (ref 8.7–10.2)
Chloride: 104 mmol/L (ref 96–106)
Creatinine, Ser: 1.04 mg/dL (ref 0.76–1.27)
GFR calc Af Amer: 91 mL/min/{1.73_m2} (ref >59)
GFR calc non Af Amer: 79 mL/min/{1.73_m2} (ref >59)
Globulin, Total: 2.5 g/dL (ref 1.5–4.5)
Glucose: 119 mg/dL — ABNORMAL HIGH (ref 65–99)
Potassium: 4.2 mmol/L (ref 3.5–5.2)
Sodium: 141 mmol/L (ref 134–144)
Total Protein: 7.1 g/dL (ref 6.0–8.5)

## 2018-12-02 LAB — LIPID PANEL
Chol/HDL Ratio: 2.9 ratio (ref 0.0–5.0)
Cholesterol, Total: 87 mg/dL — ABNORMAL LOW (ref 100–199)
HDL: 30 mg/dL — ABNORMAL LOW (ref >39)
LDL Calculated: 34 mg/dL (ref 0–99)
Triglycerides: 114 mg/dL (ref 0–149)
VLDL Cholesterol Cal: 23 mg/dL (ref 5–40)

## 2018-12-02 LAB — PSA: Prostate Specific Ag, Serum: 2.2 ng/mL (ref 0.0–4.0)

## 2018-12-08 ENCOUNTER — Telehealth: Payer: Self-pay | Admitting: Cardiology

## 2018-12-08 NOTE — Telephone Encounter (Signed)
LMTCB to schedule new patient appt w/ Dr. Percival Spanish, see referral.

## 2018-12-10 ENCOUNTER — Encounter: Payer: Self-pay | Admitting: Family Medicine

## 2018-12-19 LAB — COLOGUARD: Cologuard: NEGATIVE

## 2018-12-20 ENCOUNTER — Encounter: Payer: Self-pay | Admitting: Family Medicine

## 2018-12-29 ENCOUNTER — Other Ambulatory Visit: Payer: Self-pay

## 2018-12-29 ENCOUNTER — Ambulatory Visit (INDEPENDENT_AMBULATORY_CARE_PROVIDER_SITE_OTHER): Payer: Managed Care, Other (non HMO) | Admitting: Family Medicine

## 2018-12-29 ENCOUNTER — Telehealth: Payer: Self-pay | Admitting: Family Medicine

## 2018-12-29 VITALS — BP 122/80 | HR 84 | Temp 98.7°F | Resp 16 | Wt 301.4 lb

## 2018-12-29 DIAGNOSIS — R972 Elevated prostate specific antigen [PSA]: Secondary | ICD-10-CM | POA: Diagnosis not present

## 2018-12-29 DIAGNOSIS — I10 Essential (primary) hypertension: Secondary | ICD-10-CM

## 2018-12-29 DIAGNOSIS — F5104 Psychophysiologic insomnia: Secondary | ICD-10-CM | POA: Diagnosis not present

## 2018-12-29 DIAGNOSIS — Z8042 Family history of malignant neoplasm of prostate: Secondary | ICD-10-CM

## 2018-12-29 DIAGNOSIS — F4323 Adjustment disorder with mixed anxiety and depressed mood: Secondary | ICD-10-CM | POA: Diagnosis not present

## 2018-12-29 DIAGNOSIS — E119 Type 2 diabetes mellitus without complications: Secondary | ICD-10-CM | POA: Diagnosis not present

## 2018-12-29 DIAGNOSIS — Z794 Long term (current) use of insulin: Secondary | ICD-10-CM

## 2018-12-29 MED ORDER — METFORMIN HCL 1000 MG PO TABS: 1000.0000 mg | ORAL_TABLET | Freq: Two times a day (BID) | ORAL | 1 refills | Status: DC

## 2018-12-29 MED ORDER — HYDROXYZINE HCL 25 MG PO TABS: 12.5000 mg | ORAL_TABLET | Freq: Three times a day (TID) | ORAL | 1 refills | Status: DC | PRN

## 2018-12-29 NOTE — Progress Notes (Signed)
Subjective:    Patient ID: Brandon Rich, male    DOB: September 09, 1960, 58 y.o.   MRN: [id number]  HPI Brandon Rich is a 58 y.o. male Presents today for: Chief Complaint  Patient presents with  . Hypertension    patient stated his bp at home has been running in the 130-80s. Patient also would like to go over lab results with you  . Stress    Stress has been about the same with a little improvement with the hydroxyzine. He stated he [place] sleeping long on this med   Hypertension: BP Readings from Last 3 Encounters:  12/29/18 (!) 144/84  12/01/18 130/76  08/11/18 (!) 151/86   Lab Results  Component Value Date   CREATININE 1.04 12/01/2018  Home readings 130s over 80s. Discussed at physical in May. No new side effects of meds.  Still taking lisinopril. Exercise limited with walking boot. Monitoring sodium in diet.   Stress with insomnia Discussed that his physical May 15. Stress with work.  Handout was given on stress and adjustment disorder but also recommended meeting with therapist with phone numbers provided.  Hydroxyzine as needed for sleep/anxiety symptoms. Has appt with therapist - 1st one next Friday. Brandon Rich.  Tried hydroxyzine - has helped at bedtime- on full pill - has allowed better sleep - maintaining sleep.   Depression screen [facility] 2/9 12/01/2018 05/12/2018 02/15/2018 02/04/2018 09/20/2017  Decreased Interest 3 0 0 0 0  Down, Depressed, Hopeless 3 0 0 0 0  PHQ - 2 Score 6 0 0 0 0  Altered sleeping 3 - - - -  Tired, decreased energy 2 - - - -  Change in appetite 2 - - - -  Feeling bad or failure about yourself  3 - - - -  Trouble concentrating 0 - - - -  Moving slowly or fidgety/restless 0 - - - -  Suicidal thoughts 0 - - - -  PHQ-9 Score 16 - - - -  Difficult doing work/chores Very difficult - - - -     Diabetes: Followed by Dr. Buddy Duty.  Reports A1c of 7 few weeks ago at endocrine. Needs refill of metformin.   Lab Results  Component Value Date   HGBA1C 9.3 (H) 12/02/2016   HGBA1C 8.5 (H) 06/03/2016   HGBA1C 8.9 (H) 01/30/2016   Lab Results  Component Value Date   MICROALBUR 1.3 06/03/2016   LDLCALC 34 12/01/2018   CREATININE 1.04 12/01/2018   Question about PSA: Increased form 1.3 in 09/20/17 to 2.2 May 15th.  Lab Results  Component Value Date   PSA 1.17 01/09/2016   PSA 1.29 06/16/2015   PSA 1.08 06/06/2014  father had prostate cancer, but reportedly had minimal change with DRE.  No apparent concerns on DRE here, but technically difficult.    Patient Active Problem List   Diagnosis Date Noted  . Pain in right hand 02/15/2018  . Trigger finger 02/15/2018  . Cortical age-related cataract of both eyes 03/30/2017  . Nuclear sclerotic cataract of both eyes 03/30/2017  . Primary open angle glaucoma (POAG) of both eyes, indeterminate stage 03/30/2017  . Type 2 diabetes mellitus without complication, without long-term current use of insulin ([place]) 03/30/2017  . LVH (left ventricular hypertrophy) 03/29/2016  . FUO (fever of unknown origin) 02/16/2016  . Acute combined systolic and diastolic heart failure ([place]) 02/16/2016  . Weight gain 02/16/2016  . Weight loss 02/16/2016  . Rash and nonspecific skin eruption 02/16/2016  . DOE (  dyspnea on exertion) 02/16/2016  . T wave inversion in EKG 02/16/2016  . Transaminitis 02/16/2016  . Arthritis of big toe 08/23/2013  . Pain of left great toe 08/23/2013  . HTN (hypertension) 12/20/2011  . Diabetes mellitus ([place]) 12/20/2011  . Dyslipidemia 12/20/2011  . Glaucoma (increased eye pressure) 07/19/1996   Past Medical History:  Diagnosis Date  . Acute combined systolic and diastolic heart failure ([place]) 02/16/2016  . Allergy   . Diabetes mellitus   . DOE (dyspnea on exertion) 02/16/2016  . FUO (fever of unknown origin) 02/16/2016  . Glaucoma   . Heart murmur   . Hyperlipidemia   . Hypertension   . LVH (left ventricular hypertrophy) 03/29/2016  . Rash and nonspecific skin eruption  02/16/2016  . T wave inversion in EKG 02/16/2016  . Transaminitis 02/16/2016  . Weight gain 02/16/2016  . Weight loss 02/16/2016   Past Surgical History:  Procedure Laterality Date  . HERNIA REPAIR    . SPINE SURGERY    . TUMOR REMOVAL     No Known Allergies Prior to Admission medications   Medication Sig Start Date End Date Taking? Authorizing Provider  Ascorbic Acid (VITAMIN C) 1000 MG tablet Take 1,000 mg by mouth daily.   Yes [provider]  aspirin 81 MG tablet Take 81 mg by mouth daily.   Yes [provider]  Aspirin-Calcium Carbonate 81-777 MG TABS Take by mouth.   Yes [provider]  atorvastatin (LIPITOR) 20 MG tablet Take 1 tablet (20 mg total) by mouth daily. 12/01/18  Yes [name], MD  BD PEN NEEDLE NANO U/F 32G X 4 MM MISC  07/04/18  Yes [provider]  brimonidine (ALPHAGAN) 0.2 % ophthalmic solution Place 1 drop into both eyes 2 (two) times daily.   Yes [provider]  calcium carbonate (TUMS - DOSED IN MG ELEMENTAL CALCIUM) 500 MG chewable tablet Chew 2 tablets by mouth daily as needed for indigestion or heartburn.   Yes [provider]  Cholecalciferol (VITAMIN D-1000 MAX ST) 1000 units tablet Take by mouth.   Yes [provider]  empagliflozin (JARDIANCE) 25 MG TABS tablet Take 25 mg by mouth daily. 12/16/16  Yes [name], MD  fexofenadine (ALLEGRA) 180 MG tablet Take 180 mg by mouth daily.   Yes [provider]  fluticasone (FLONASE) 50 MCG/ACT nasal spray PLACE 2 SPRAYS INTO BOTH NOSTRILS DAILY. 07/09/14  Yes GuestBenn Moulder, MD  hydrOXYzine (ATARAX/VISTARIL) 25 MG tablet Take 0.5-1 tablets (12.5-25 mg total) by mouth 3 (three) times daily as needed for anxiety (or sleep). 12/01/18  Yes [name], MD  ibuprofen (ADVIL,MOTRIN) 600 MG tablet Take 600 mg by mouth every 6 (six) hours as needed.   Yes [provider]  Insulin Glargine (BASAGLAR KWIKPEN) 100 UNIT/ML SOPN  Inject into the skin. 03/07/17  Yes [provider]  lisinopril (ZESTRIL) 20 MG tablet Take 1 tablet (20 mg total) by mouth daily. 12/01/18  Yes [name], MD  metFORMIN (GLUCOPHAGE) 1000 MG tablet TAKE ONE TABLET BY MOUTH TWO TIMES A DAY WITH A MEAL 11/27/18  Yes [name], MD  Multiple Vitamin (MULTIVITAMIN) tablet Take 1 tablet by mouth every evening.    Yes [provider]  Semaglutide,0.25 or 0.5MG /DOS, (OZEMPIC, 0.25 OR 0.5 MG/DOSE,) 2 MG/1.5ML SOPN Inject into the skin.   Yes [provider]  silver sulfADIAZINE (SILVADENE) 1 % cream Apply pea-sized amount to wound daily. 07/04/18  Yes Price,  Christian Mate, DPM  timolol (TIMOPTIC-XR) 0.5 % ophthalmic gel-forming Apply to eye. 06/26/13 04/10/19 Yes [provider]   Social History   Socioeconomic History  . Marital status: Married    Spouse name: Not on file  . Number of children: 2  . Years of education: 15  . Highest education level: Not on file  Occupational History  . Occupation: Tour manager  Social Needs  . Financial resource strain: Not on file  . Food insecurity    Worry: Not on file    Inability: Not on file  . Transportation needs    Medical: Not on file    Non-medical: Not on file  Tobacco Use  . Smoking status: Never Smoker  . Smokeless tobacco: Never Used  Substance and Sexual Activity  . Alcohol use: No    Alcohol/week: 0.0 standard drinks    Comment: 1 drink  . Drug use: No  . Sexual activity: Yes    Partners: Female  Lifestyle  . Physical activity    Days per week: Not on file    Minutes per session: Not on file  . Stress: Not on file  Relationships  . Social Herbalist on phone: Not on file    Gets together: Not on file    Attends religious service: Not on file    Active member of club or organization: Not on file    Attends meetings of clubs or organizations: Not on file    Relationship status: Not on file  . Intimate partner  violence    Fear of current or ex partner: Not on file    Emotionally abused: Not on file    Physically abused: Not on file    Forced sexual activity: Not on file  Other Topics Concern  . Not on file  Social History Narrative   Married. Education: [employer].     Review of Systems     Objective:   Physical Exam Vitals signs reviewed.  Constitutional:      Appearance: He is well-developed.  HENT:     Head: Normocephalic and atraumatic.  Eyes:     Pupils: Pupils are equal, round, and reactive to light.  Neck:     Vascular: No carotid bruit or JVD.  Cardiovascular:     Rate and Rhythm: Normal rate and regular rhythm.     Heart sounds: Normal heart sounds. No murmur.  Pulmonary:     Effort: Pulmonary effort is normal.     Breath sounds: Normal breath sounds. No rales.  Skin:    General: Skin is warm and dry.  Neurological:     Mental Status: He is alert and oriented to person, place, and time.  Psychiatric:        Mood and Affect: Mood normal.        Behavior: Behavior normal.        Thought Content: Thought content normal.    Vitals:   12/29/18 1100  BP: (!) 144/84  Pulse: 84  Resp: 16  Temp: 98.7 F (37.1 C)  TempSrc: Oral  SpO2: 96%  Weight: (!) 301 lb 6.4 oz (136.7 kg)          Assessment & Plan:   DAYVIAN BLIXT is a 58 y.o. male Increased prostate specific antigen (PSA) velocity - Plan: Ambulatory referral to Urology Family history of prostate cancer - Plan: Ambulatory referral to Urology,   -in normal range recently, but increase from prior reading and FH of  prostate CA.   -refer to urology for possible repeat DRE and discussion of further testing/monitoring.    Psychophysiological insomnia - Plan: hydrOXYzine (ATARAX/VISTARIL) 25 MG tablet Adjustment disorder with mixed anxiety and depressed mood - Plan: hydrOXYzine (ATARAX/VISTARIL) 25 MG tablet,   -Improved.  Continue hydroxyzine, refill placed.  Follow-up with counseling as planned.  Type 2  diabetes mellitus without complication, with long-term current use of insulin ([place]) - Plan: metFORMIN (GLUCOPHAGE) 1000 MG tablet,  Essential hypertension - Plan: metFORMIN (GLUCOPHAGE) 1000 MG tablet,   -Metformin refilled, continue ongoing care with endocrinologist.     Meds ordered this encounter  Medications  . hydrOXYzine (ATARAX/VISTARIL) 25 MG tablet    Sig: Take 0.5-1 tablets (12.5-25 mg total) by mouth 3 (three) times daily as needed for anxiety (or sleep).    Dispense:  90 tablet    Refill:  1  . metFORMIN (GLUCOPHAGE) 1000 MG tablet    Sig: Take 1 tablet (1,000 mg total) by mouth 2 (two) times daily with a meal.    Dispense:  180 tablet    Refill:  1   Patient Instructions   I will refer you to urology to discuss the increase in PSA reading and recommendations on monitoring.  Hydroxyzine was refilled, keep follow-up with therapist next week as planned.  Metformin was refilled, but can be discussed with your endocrinologist in the future for further refills if needed.  Return to the clinic or go to the nearest emergency room if any of your symptoms worsen or new symptoms occur.   If you have lab work done today you will be contacted with your lab results within the next 2 weeks.  If you have not heard from [place] then please contact us. The fastest way to get your results is to register for My Chart.   IF you received an x-ray today, you will receive an invoice from [facility]. Please contact [facility] at [phone] with questions or concerns regarding your invoice.   IF you received labwork today, you will receive an invoice from [place]. Please contact [facility] at [phone] with questions or concerns regarding your invoice.   Our billing staff will not be able to assist you with questions regarding bills from these companies.  You will be contacted with the lab results as soon as they are available. The fastest way to get your results is to  activate your My Chart account. Instructions are located on the last page of this paperwork. If you have not heard from [place] regarding the results in 2 weeks, please contact this office.       Signed,   [name], MD [facility] [place].  12/30/18 11:14 AM

## 2018-12-29 NOTE — Telephone Encounter (Signed)
12/29/2018 - PATIENT HAD AN OFFICE VISIT WITH DR. Carlota Raspberry ON Friday (12/29/2018). I THOUGHT HE HAD ALREADY LEFT SO I CALLED HIM TO SCHEDULE HIS 6 MONTH FOLLOW-UP ON HIS MEDICATION REVIEW. TURNS OUT HE WAS STILL IN THE OFFICE SO I WAS ABLE TO GO AHEAD AND SCHEDULE. Brandon Rich

## 2018-12-29 NOTE — Patient Instructions (Addendum)
I will refer you to urology to discuss the increase in PSA reading and recommendations on monitoring.  Hydroxyzine was refilled, keep follow-up with therapist next week as planned.  Metformin was refilled, but can be discussed with your endocrinologist in the future for further refills if needed.  Return to the clinic or go to the nearest emergency room if any of your symptoms worsen or new symptoms occur.   If you have lab work done today you will be contacted with your lab results within the next 2 weeks.  If you have not heard from [place] then please contact us. The fastest way to get your results is to register for My Chart.   IF you received an x-ray today, you will receive an invoice from [facility]. Please contact [facility] at [phone] with questions or concerns regarding your invoice.   IF you received labwork today, you will receive an invoice from [place]. Please contact [facility] at [phone] with questions or concerns regarding your invoice.   Our billing staff will not be able to assist you with questions regarding bills from these companies.  You will be contacted with the lab results as soon as they are available. The fastest way to get your results is to activate your My Chart account. Instructions are located on the last page of this paperwork. If you have not heard from [place] regarding the results in 2 weeks, please contact this office.

## 2018-12-30 ENCOUNTER — Encounter: Payer: Self-pay | Admitting: Family Medicine

## 2019-01-08 ENCOUNTER — Encounter: Payer: Self-pay | Admitting: Family Medicine

## 2019-01-08 DIAGNOSIS — F411 Generalized anxiety disorder: Secondary | ICD-10-CM

## 2019-01-16 MED ORDER — SERTRALINE HCL 25 MG PO TABS: 25.0000 mg | ORAL_TABLET | Freq: Every day | ORAL | 0 refills | Status: DC

## 2019-02-01 ENCOUNTER — Telehealth: Payer: Self-pay | Admitting: Cardiology

## 2019-02-01 ENCOUNTER — Encounter: Payer: Self-pay | Admitting: Cardiology

## 2019-02-01 NOTE — Telephone Encounter (Signed)
New Message ° ° °Patient returning your call. °

## 2019-02-01 NOTE — Progress Notes (Signed)
Cardiology Office Note   Date:  02/02/2019   ID:  Brandon SHOULTZ, DOB 03/17/1961, MRN [id number]  PCP:  Brandon Agreste, MD  Cardiologist:   No primary care provider on file. Referring:  Brandon Agreste, MD  Chief Complaint  Patient presents with  . Chest Pain     History of Present Illness: Brandon Rich is a 58 y.o. male who is referred by Brandon Agreste, MD for evaluation of chest pain.  The patient has no past cardiac history although he had a fever of unknown origin got an extensive work-up 3 years ago.  He did have an echo in 2017 which was normal .  He had T wave inversion on an EKG. However, his EKG now was normal.  He has had some chest discomfort.  This is been atypical.  It happens only with her anxiety and stress and has been managed for this.  It is a sharp pain.  He does a lot of walking as a manufacturer and does not describe discomfort with this.  He is nursing and nonhealing diabetic ulcer right now.  He says the discomfort is 3 out of 10.  His left upper chest.  It feels like a sharp pain.  There is no radiation.  There is no associated symptoms.  He is not having any palpitations other than occasionally at night when he wakes up and feels anxious he might feel a strong heartbeat.  He had this in the distant past but there were no significant arrhythmias.  He has had no presyncope or syncope.  He has had no new edema.   Past Medical History:  Diagnosis Date  . Acute combined systolic and diastolic heart failure ([place]) 02/16/2016  . Allergy   . Diabetes mellitus   . Diabetic foot ulcer ([place])   . FUO (fever of unknown origin) 02/16/2016  . Glaucoma   . Hyperlipidemia   . Hypertension   . LVH (left ventricular hypertrophy) 03/29/2016  . Rash and nonspecific skin eruption 02/16/2016  . Sleep apnea    CPAP  . Transaminitis 02/16/2016    Past Surgical History:  Procedure Laterality Date  . HERNIA REPAIR    . SPINE SURGERY    . TUMOR REMOVAL     Bone  Cyst     Current Outpatient Medications  Medication Sig Dispense Refill  . Ascorbic Acid (VITAMIN C) 1000 MG tablet Take 1,000 mg by mouth daily.    Marland Kitchen aspirin 81 MG tablet Take 81 mg by mouth daily.    . Aspirin-Calcium Carbonate 81-777 MG TABS Take by mouth.    Marland Kitchen atorvastatin (LIPITOR) 20 MG tablet Take 1 tablet (20 mg total) by mouth daily. 90 tablet 2  . BD PEN NEEDLE NANO U/F 32G X 4 MM MISC     . brimonidine (ALPHAGAN) 0.2 % ophthalmic solution Place 1 drop into both eyes 2 (two) times daily.    . calcium carbonate (TUMS - DOSED IN MG ELEMENTAL CALCIUM) 500 MG chewable tablet Chew 2 tablets by mouth daily as needed for indigestion or heartburn.    . Cholecalciferol (VITAMIN D-1000 MAX ST) 1000 units tablet Take by mouth.    . empagliflozin (JARDIANCE) 25 MG TABS tablet Take 25 mg by mouth daily. 30 tablet 3  . fexofenadine (ALLEGRA) 180 MG tablet Take 180 mg by mouth daily.    . fluticasone (FLONASE) 50 MCG/ACT nasal spray PLACE 2 SPRAYS INTO BOTH NOSTRILS DAILY. 16 g 10  .  hydrOXYzine (ATARAX/VISTARIL) 25 MG tablet Take 0.5-1 tablets (12.5-25 mg total) by mouth 3 (three) times daily as needed for anxiety (or sleep). 90 tablet 1  . ibuprofen (ADVIL,MOTRIN) 600 MG tablet Take 600 mg by mouth every 6 (six) hours as needed.    . Insulin Glargine (BASAGLAR KWIKPEN) 100 UNIT/ML SOPN Inject into the skin.    Marland Kitchen lisinopril (ZESTRIL) 40 MG tablet Take 1 tablet (40 mg total) by mouth daily. 90 tablet 3  . metFORMIN (GLUCOPHAGE) 1000 MG tablet Take 1 tablet (1,000 mg total) by mouth 2 (two) times daily with a meal. 180 tablet 1  . Multiple Vitamin (MULTIVITAMIN) tablet Take 1 tablet by mouth every evening.     . Semaglutide,0.25 or 0.5MG /DOS, (OZEMPIC, 0.25 OR 0.5 MG/DOSE,) 2 MG/1.5ML SOPN Inject into the skin.    Marland Kitchen sertraline (ZOLOFT) 25 MG tablet Take 1 tablet (25 mg total) by mouth daily. 90 tablet 0  . silver sulfADIAZINE (SILVADENE) 1 % cream Apply pea-sized amount to wound daily. 50 g 0  .  timolol (TIMOPTIC-XR) 0.5 % ophthalmic gel-forming Apply to eye.     No current facility-administered medications for this visit.     Allergies:   Patient has no known allergies.    Social History:  The patient  reports that he has never smoked. He has never used smokeless tobacco. He reports that he does not drink alcohol or use drugs.   Family History:  The patient's family history includes Cancer in his father and mother; Heart disease in his father, maternal grandfather, maternal grandmother, mother, and paternal grandfather; Hyperlipidemia in his maternal grandmother and paternal grandfather; Hypertension in his maternal grandmother, mother, and paternal grandfather; Skin cancer in his mother.    ROS:  Please see the history of present illness.   Otherwise, review of systems are positive for none.   All other systems are reviewed and negative.    PHYSICAL EXAM: VS:  BP (!) 156/86   Pulse 92   Temp 98.4 F (36.9 C)   Ht 5\' 9"  (1.753 m)   Wt (!) 302 lb 3.2 oz (137.1 kg)   BMI 44.63 kg/m  , BMI Body mass index is 44.63 kg/m. GENERAL:  Well appearing HEENT:  Pupils equal round and reactive, fundi not visualized, oral mucosa unremarkable NECK:  No jugular venous distention, waveform within normal limits, carotid upstroke brisk and symmetric, no bruits, no thyromegaly LYMPHATICS:  No cervical, inguinal adenopathy LUNGS:  Clear to auscultation bilaterally BACK:  No CVA tenderness CHEST:  Unremarkable HEART:  PMI not displaced or sustained,S1 and S2 within normal limits, no S3, no S4, no clicks, no rubs, no murmurs ABD:  Flat, positive bowel sounds normal in frequency in pitch, no bruits, no rebound, no guarding, no midline pulsatile mass, no hepatomegaly, no splenomegaly EXT:  2 plus pulses throughout, no edema, no cyanosis no clubbing, left foot in a boot SKIN:  No rashes no nodules NEURO:  Cranial nerves II through XII grossly intact, motor grossly intact throughout PSYCH:   Cognitively intact, oriented to person place and time    EKG:  EKG is not ordered today. 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   Recent Labs: 05/12/2018: Hemoglobin 15.6 12/01/2018: ALT 26; BUN 16; Creatinine, Ser 1.04; Potassium 4.2; Sodium 141    Lipid Panel    Component Value Date/Time   CHOL 87 (L) 12/01/2018  1041   TRIG 114 12/01/2018 1041   HDL 30 (L) 12/01/2018 1041   CHOLHDL 2.9 12/01/2018 1041   CHOLHDL 3.8 06/03/2016 1759   VLDL 35 (H) 06/03/2016 1759   LDLCALC 34 12/01/2018 1041      Wt Readings from Last 3 Encounters:  02/02/19 (!) 302 lb 3.2 oz (137.1 kg)  12/29/18 (!) 301 lb 6.4 oz (136.7 kg)  12/01/18 (!) 301 lb 9.6 oz (136.8 kg)      Other studies Reviewed: Additional studies/ records that were reviewed today include: None. Review of the above records demonstrates:  Please see elsewhere in the note.     ASSESSMENT AND PLAN:  CHEST PAIN:   The patient does have chest discomfort.  He has significant cardiovascular risk factors.  I like to walk him on a treadmill but he would be able to do this.  I do not think a CT would be helpful as his heart rate is high.  I will screen him with a Lexiscan Myoview.  HTN: His blood pressure is elevated.  I may increase his lisinopril to 40 mg daily and he should get a blood pressure cuff calibrated.  He has 1 at home.  DYSLIPIDEMIA: LDL is 41.  HDL 30.  We will continue the meds as listed.  OBESITY: We talked about weight loss and I suggested a Mediterranean diet.  ABNORMAL EKG: This will be evaluated as above.   Current medicines are reviewed at length with the patient today.  The patient does not have concerns regarding medicines.  The following changes have been made:  no change  Labs/ tests ordered today include:   Orders Placed This Encounter  Procedures  .  MYOCARDIAL PERFUSION IMAGING     Disposition:   FU with me as needed    Signed, Minus Breeding, MD  02/02/2019 3:44 PM    [facility]

## 2019-02-01 NOTE — Telephone Encounter (Signed)
New Message ° ° ° °Left message to confirm appt and answer covid questions  °

## 2019-02-02 ENCOUNTER — Ambulatory Visit: Payer: Managed Care, Other (non HMO) | Admitting: Cardiology

## 2019-02-02 ENCOUNTER — Other Ambulatory Visit: Payer: Self-pay

## 2019-02-02 ENCOUNTER — Encounter: Payer: Self-pay | Admitting: Cardiology

## 2019-02-02 VITALS — BP 156/86 | HR 92 | Temp 98.4°F | Ht 69.0 in | Wt 302.2 lb

## 2019-02-02 DIAGNOSIS — I10 Essential (primary) hypertension: Secondary | ICD-10-CM | POA: Diagnosis not present

## 2019-02-02 DIAGNOSIS — R0789 Other chest pain: Secondary | ICD-10-CM | POA: Diagnosis not present

## 2019-02-02 DIAGNOSIS — E785 Hyperlipidemia, unspecified: Secondary | ICD-10-CM

## 2019-02-02 DIAGNOSIS — E118 Type 2 diabetes mellitus with unspecified complications: Secondary | ICD-10-CM

## 2019-02-02 DIAGNOSIS — Z794 Long term (current) use of insulin: Secondary | ICD-10-CM

## 2019-02-02 MED ORDER — LISINOPRIL 20 MG PO TABS: 20.0000 mg | ORAL_TABLET | Freq: Every day | ORAL | 2 refills | Status: DC

## 2019-02-02 MED ORDER — LISINOPRIL 40 MG PO TABS: 40.0000 mg | ORAL_TABLET | Freq: Every day | ORAL | 3 refills | Status: DC

## 2019-02-02 NOTE — Patient Instructions (Addendum)
Medication Instructions:  INCREASE YOUR LISINOPRIL TO 40 MG DAILY   If you need a refill on your cardiac medications before your next appointment, please call your pharmacy.   Lab work: NONE   Testing/Procedures: Your physician has requested that you have a lexiscan myoview. For further information please visit [email or web address]. Please follow instruction sheet, as given. 2 DAY STUDY TO BE DONE AT [facility] OFF[facility]   Follow-Up: AS NEEDED   Any Other Special Instructions Will Be Listed Below (If Applicable).   Cardiac Nuclear Scan A cardiac nuclear scan is a test that is done to check the flow of blood to your heart. It is done when you are resting and when you are exercising. The test looks for problems such as:  Not enough blood reaching a portion of the heart.  The heart muscle not working as it should. You may need this test if:  You have heart disease.  You have had lab results that are not normal.  You have had heart surgery or a balloon procedure to open up blocked arteries (angioplasty).  You have chest pain.  You have shortness of breath. In this test, a special dye (tracer) is put into your bloodstream. The tracer will travel to your heart. A camera will then take pictures of your heart to see how the tracer moves through your heart. This test is usually done at a hospital and takes 2-4 hours. Tell a doctor about:  Any allergies you have.  All medicines you are taking, including vitamins, herbs, eye drops, creams, and over-the-counter medicines.  Any problems you or family members have had with anesthetic medicines.  Any blood disorders you have.  Any surgeries you have had.  Any medical conditions you have.  Whether you are pregnant or may be pregnant. What are the risks? Generally, this is a safe test. However, problems may occur, such as:  Serious chest pain and heart attack. This is only a risk if the stress portion of the test is done.  Rapid  heartbeat.  A feeling of warmth in your chest. This feeling usually does not last long.  Allergic reaction to the tracer. What happens before the test?  Ask your doctor about changing or stopping your normal medicines. This is important.  Follow instructions from your doctor about what you cannot eat or drink.  Remove your jewelry on the day of the test. What happens during the test?  An IV tube will be inserted into one of your veins.  Your doctor will give you a small amount of tracer through the IV tube.  You will wait for 20-40 minutes while the tracer moves through your bloodstream.  Your heart will be monitored with an electrocardiogram (ECG).  You will lie down on an exam table.  Pictures of your heart will be taken for about 15-20 minutes.  You may also have a stress test. For this test, one of these things may be done: ? You will be asked to exercise on a treadmill or a stationary bike. ? You will be given medicines that will make your heart work harder. This is done if you are unable to exercise.  When blood flow to your heart has peaked, a tracer will again be given through the IV tube.  After 20-40 minutes, you will get back on the exam table. More pictures will be taken of your heart.  Depending on the tracer that is used, more pictures may need to be taken 3-4 hours later.  Your IV tube will be removed when the test is over. The test may vary among doctors and hospitals. What happens after the test?  Ask your doctor: ? Whether you can return to your normal schedule, including diet, activities, and medicines. ? Whether you should drink more fluids. This will help to remove the tracer from your body. Drink enough fluid to keep your pee (urine) pale yellow.  Ask your doctor, or the department that is doing the test: ? When will my results be ready? ? How will I get my results? Summary  A cardiac nuclear scan is a test that is done to check the flow of blood  to your heart.  Tell your doctor whether you are pregnant or may be pregnant.  Before the test, ask your doctor about changing or stopping your normal medicines. This is important.  Ask your doctor whether you can return to your normal activities. You may be asked to drink more fluids. This information is not intended to replace advice given to you by your health care provider. Make sure you discuss any questions you have with your health care provider. Document Released: 12/19/2017 Document Revised: 10/25/2018 Document Reviewed: 12/19/2017 Elsevier Patient Education  2020 [employer].

## 2019-02-16 ENCOUNTER — Ambulatory Visit: Payer: Managed Care, Other (non HMO) | Admitting: Family Medicine

## 2019-02-16 ENCOUNTER — Encounter: Payer: Self-pay | Admitting: Family Medicine

## 2019-02-16 ENCOUNTER — Other Ambulatory Visit: Payer: Self-pay

## 2019-02-16 VITALS — BP 129/76 | HR 71 | Temp 98.7°F | Resp 14 | Wt 295.0 lb

## 2019-02-16 DIAGNOSIS — I10 Essential (primary) hypertension: Secondary | ICD-10-CM

## 2019-02-16 DIAGNOSIS — R079 Chest pain, unspecified: Secondary | ICD-10-CM | POA: Diagnosis not present

## 2019-02-16 DIAGNOSIS — F411 Generalized anxiety disorder: Secondary | ICD-10-CM

## 2019-02-16 MED ORDER — SERTRALINE HCL 50 MG PO TABS: 50.0000 mg | ORAL_TABLET | Freq: Every day | ORAL | 1 refills | Status: DC

## 2019-02-16 NOTE — Addendum Note (Signed)
Addended by: [name] R on: 02/16/2019 03:17 PM   Modules accepted: Orders

## 2019-02-16 NOTE — Patient Instructions (Addendum)
   With persistent anxiety - ok to try doubling zoloft to 2 pills per day. If 50mg  dose works well - fill the printed higher dose rx.  continue higher dose of lisinopril, follow up with urology and stress testing.   recheck in 3 months, but let me know if questions sooner.   If you have lab work done today you will be contacted with your lab results within the next 2 weeks.  If you have not heard from [place] then please contact us. The fastest way to get your results is to register for My Chart.   IF you received an x-ray today, you will receive an invoice from [facility]. Please contact [facility] at [phone] with questions or concerns regarding your invoice.   IF you received labwork today, you will receive an invoice from [facility]. Please contact [facility] at [phone] with questions or concerns regarding your invoice.   Our billing staff will not be able to assist you with questions regarding bills from these companies.  You will be contacted with the lab results as soon as they are available. The fastest way to get your results is to activate your My Chart account. Instructions are located on the last page of this paperwork. If you have not heard from [place] regarding the results in 2 weeks, please contact this office.     \

## 2019-02-16 NOTE — Progress Notes (Signed)
Subjective:    Patient ID: Brandon Rich, male    DOB: [birth year], 59 y.o.   MRN: [id number]  HPI Brandon Rich is a 58 y.o. male Presents today for: Chief Complaint  Patient presents with  . anxiety and depresion    Patient stated he is doing much better on the medication zoloft. Seen by his therapist. Patient want to know he is to stay at the dose of 25 mg on the zoloft or increase to the 50 mg.  PAtient also stated he has been seen but cardio and he is scheduled for a stress test next week. Gad7=7 PHQ9=5. [place] has increased his lisionptil to 40 mg and stated moving forward Dr Carlota Raspberry can refill the increased dose  . Diabetes    I see foot exam and A1c was preordered but patient states his is being followed by DR Marianna Fuss for his diabetes at [facility]. His foot exam  and A1c was done in May A1c was 7.0. I  am in the process of getting records from [place] just for our review    Anxiety: See previous visits in May and June.  Initially thought to have adjustment disorder with mixed anxiety and depressed mood, insomnia hydroxyzine initially prescribed, and evaluated by therapist.  Due to persistent anxiety symptoms, and discussion with therapy, ultimately decided to start SSRI zoloft 25mg  QD approximately 1 month ago.  Cautioned about use with NSAIDs. - no recent nsaid.  appt with [name] weekly - appt this am.  Question about higher dose of zoloft.  No new side effects.  Feels like less anxiety on meds and with counseling - sleeping better. hydroxyzine once at night. Has been working well - over 7 hrs usually. Still some anxiety.  No alcohol/tobacco/IDU.   GAD 7 : Generalized Anxiety Score 02/16/2019 12/29/2018  Nervous, Anxious, on Edge 1 0  Control/stop worrying 1 1  Worry too much - different things 1 1  Trouble relaxing 1 0  Restless 0 0  Easily annoyed or irritable 1 1  Afraid - awful might happen 1 1  Total GAD 7 Score 6 4   Depression screen [facility] 2/9 02/16/2019 12/29/2018  12/01/2018 05/12/2018 02/15/2018  Decreased Interest 0 1 3 0 0  Down, Depressed, Hopeless 1 1 3  0 0  PHQ - 2 Score 1 2 6  0 0  Altered sleeping 1 0 3 - -  Tired, decreased energy 1 1 2  - -  Change in appetite 0 0 2 - -  Feeling bad or failure about yourself  1 1 3  - -  Trouble concentrating 1 0 0 - -  Moving slowly or fidgety/restless 0 0 0 - -  Suicidal thoughts 0 0 0 - -  PHQ-9 Score 5 4 16  - -  Difficult doing work/chores - Not difficult at all Very difficult - -     Increased PSA 1.3 in March 2019, 2.2 in May.  Plan for urology evaluation given family history of prostate cancer. appt in late August with urology.   Followed by endocrinology for diabetes, cardiologist Dr. Percival Spanish with appointment July 17.  Normal echo 2017, recent chest pains, plan for Lexiscan Myoview stress testing next week. Tolerating higher dose of lisinopril.     Patient Active Problem List   Diagnosis Date Noted  . Chest discomfort 02/02/2019  . Morbid obesity ([place]) 02/02/2019  . Pain in right hand 02/15/2018  . Trigger finger 02/15/2018  . Cortical age-related cataract of both eyes 03/30/2017  .  Nuclear sclerotic cataract of both eyes 03/30/2017  . Primary open angle glaucoma (POAG) of both eyes, indeterminate stage 03/30/2017  . Type 2 diabetes mellitus without complication, without long-term current use of insulin ([place]) 03/30/2017  . LVH (left ventricular hypertrophy) 03/29/2016  . FUO (fever of unknown origin) 02/16/2016  . Acute combined systolic and diastolic heart failure ([place]) 02/16/2016  . Weight gain 02/16/2016  . Weight loss 02/16/2016  . Rash and nonspecific skin eruption 02/16/2016  . DOE (dyspnea on exertion) 02/16/2016  . T wave inversion in EKG 02/16/2016  . Transaminitis 02/16/2016  . Arthritis of big toe 08/23/2013  . Pain of left great toe 08/23/2013  . HTN (hypertension) 12/20/2011  . Diabetes mellitus ([place]) 12/20/2011  . Dyslipidemia 12/20/2011  . Glaucoma (increased eye  pressure) 07/19/1996   Past Medical History:  Diagnosis Date  . Acute combined systolic and diastolic heart failure ([place]) 02/16/2016  . Allergy   . Diabetes mellitus   . Diabetic foot ulcer ([place])   . FUO (fever of unknown origin) 02/16/2016  . Glaucoma   . Hyperlipidemia   . Hypertension   . LVH (left ventricular hypertrophy) 03/29/2016  . Rash and nonspecific skin eruption 02/16/2016  . Sleep apnea    CPAP  . Transaminitis 02/16/2016   Past Surgical History:  Procedure Laterality Date  . HERNIA REPAIR    . SPINE SURGERY    . TUMOR REMOVAL     Bone Cyst   No Known Allergies Prior to Admission medications   Medication Sig Start Date End Date Taking? Authorizing Provider  Ascorbic Acid (VITAMIN C) 1000 MG tablet Take 1,000 mg by mouth daily.    [provider]  aspirin 81 MG tablet Take 81 mg by mouth daily.    [provider]  Aspirin-Calcium Carbonate 81-777 MG TABS Take by mouth.    [provider]  atorvastatin (LIPITOR) 20 MG tablet Take 1 tablet (20 mg total) by mouth daily. 12/01/18   [name], MD  BD PEN NEEDLE NANO U/F 32G X 4 MM MISC  07/04/18   [provider]  brimonidine (ALPHAGAN) 0.2 % ophthalmic solution Place 1 drop into both eyes 2 (two) times daily.    [provider]  calcium carbonate (TUMS - DOSED IN MG ELEMENTAL CALCIUM) 500 MG chewable tablet Chew 2 tablets by mouth daily as needed for indigestion or heartburn.    [provider]  Cholecalciferol (VITAMIN D-1000 MAX ST) 1000 units tablet Take by mouth.    [provider]  empagliflozin (JARDIANCE) 25 MG TABS tablet Take 25 mg by mouth daily. 12/16/16   [name], MD  fexofenadine (ALLEGRA) 180 MG tablet Take 180 mg by mouth daily.    [provider]  fluticasone (FLONASE) 50 MCG/ACT nasal spray PLACE 2 SPRAYS INTO BOTH NOSTRILS DAILY. 07/09/14   Orma Flaming, MD  hydrOXYzine (ATARAX/VISTARIL) 25 MG tablet Take 0.5-1 tablets  (12.5-25 mg total) by mouth 3 (three) times daily as needed for anxiety (or sleep). 12/29/18   [name], MD  ibuprofen (ADVIL,MOTRIN) 600 MG tablet Take 600 mg by mouth every 6 (six) hours as needed.    [provider]  Insulin Glargine (BASAGLAR KWIKPEN) 100 UNIT/ML SOPN Inject into the skin. 03/07/17   [provider]  lisinopril (ZESTRIL) 40 MG tablet Take 1 tablet (40 mg total) by mouth daily. 02/02/19   Minus Breeding, MD  metFORMIN (GLUCOPHAGE) 1000 MG tablet Take 1 tablet (1,000 mg total)  by mouth 2 (two) times daily with a meal. 12/29/18   [name], MD  Multiple Vitamin (MULTIVITAMIN) tablet Take 1 tablet by mouth every evening.     [provider]  Semaglutide,0.25 or 0.5MG /DOS, (OZEMPIC, 0.25 OR 0.5 MG/DOSE,) 2 MG/1.5ML SOPN Inject into the skin.    [provider]  sertraline (ZOLOFT) 25 MG tablet Take 1 tablet (25 mg total) by mouth daily. 01/16/19   [name], MD  silver sulfADIAZINE (SILVADENE) 1 % cream Apply pea-sized amount to wound daily. 07/04/18   Evelina Bucy, DPM  timolol (TIMOPTIC-XR) 0.5 % ophthalmic gel-forming Apply to eye. 06/26/13 04/10/19  [provider]   Social History   Socioeconomic History  . Marital status: Married    Spouse name: Not on file  . Number of children: 2  . Years of education: 6  . Highest education level: Not on file  Occupational History  . Occupation: Tour manager  Social Needs  . Financial resource strain: Not on file  . Food insecurity    Worry: Not on file    Inability: Not on file  . Transportation needs    Medical: Not on file    Non-medical: Not on file  Tobacco Use  . Smoking status: Never Smoker  . Smokeless tobacco: Never Used  Substance and Sexual Activity  . Alcohol use: No    Alcohol/week: 0.0 standard drinks    Comment: 1 drink  . Drug use: No  . Sexual activity: Yes    Partners: Female  Lifestyle  . Physical activity    Days per  week: Not on file    Minutes per session: Not on file  . Stress: Not on file  Relationships  . Social Herbalist on phone: Not on file    Gets together: Not on file    Attends religious service: Not on file    Active member of club or organization: Not on file    Attends meetings of clubs or organizations: Not on file    Relationship status: Not on file  . Intimate partner violence    Fear of current or ex partner: Not on file    Emotionally abused: Not on file    Physically abused: Not on file    Forced sexual activity: Not on file  Other Topics Concern  . Not on file  Social History Narrative   Married. Education: [employer].     Review of Systems Per HPI.     Objective:   Physical Exam Vitals signs reviewed.  Constitutional:      Appearance: He is well-developed.  HENT:     Head: Normocephalic and atraumatic.  Eyes:     Pupils: Pupils are equal, round, and reactive to light.  Neck:     Vascular: No carotid bruit or JVD.  Cardiovascular:     Rate and Rhythm: Normal rate and regular rhythm.     Heart sounds: Normal heart sounds. No murmur.  Pulmonary:     Effort: Pulmonary effort is normal.     Breath sounds: Normal breath sounds. No rales.  Skin:    General: Skin is warm and dry.  Neurological:     Mental Status: He is alert and oriented to person, place, and time.    Vitals:   02/16/19 1500  BP: 129/76  Pulse: 71  Resp: 14  Temp: 98.7 F (37.1 C)  TempSrc: Oral  SpO2: 99%  Weight: 295 lb (133.8 kg)  Assessment & Plan:   Brandon Rich is a 58 y.o. male Anxiety state - Plan: sertraline (ZOLOFT) 50 MG tablet  -Improved but still some persistent anxiety symptoms, suspect he will need higher dose of Zoloft.  Will increase to 50 mg daily, continue follow-up with therapist, recheck 3 months.  Essential hypertension  -Improved with higher dosing of lisinopril prescribed by neurology.  Continue same.  Has refills on meds.   Intermittent  chest pain  -Stable, no plan.  Stress testing next week.  RTC/ER precautions if worsening,   continue routine follow-up with cardiology, and urology as planned for evaluation of PSA as above.   Meds ordered this encounter  Medications  . sertraline (ZOLOFT) 50 MG tablet    Sig: Take 1 tablet (50 mg total) by mouth daily.    Dispense:  90 tablet    Refill:  1   Patient Instructions     With persistent anxiety - ok to try doubling zoloft to 2 pills per day. If 50mg  dose works well - fill the printed higher dose rx.  continue higher dose of lisinopril, follow up with urology and stress testing.   recheck in 3 months, but let me know if questions sooner.   If you have lab work done today you will be contacted with your lab results within the next 2 weeks.  If you have not heard from [place] then please contact us. The fastest way to get your results is to register for My Chart.   IF you received an x-ray today, you will receive an invoice from [facility]. Please contact [facility] at [phone] with questions or concerns regarding your invoice.   IF you received labwork today, you will receive an invoice from [place]. Please contact [facility] at [phone] with questions or concerns regarding your invoice.   Our billing staff will not be able to assist you with questions regarding bills from these companies.  You will be contacted with the lab results as soon as they are available. The fastest way to get your results is to activate your My Chart account. Instructions are located on the last page of this paperwork. If you have not heard from [place] regarding the results in 2 weeks, please contact this office.     \  Signed,   [name], MD [facility] [place] [place].  02/16/19 3:13 PM

## 2019-02-20 ENCOUNTER — Telehealth (HOSPITAL_COMMUNITY): Payer: Self-pay | Admitting: *Deleted

## 2019-02-20 NOTE — Telephone Encounter (Signed)
Left message on voicemail per DPR in reference to upcoming appointment scheduled on 02/22/19 at 1300 with detailed instructions given per Myocardial Perfusion Study Information Sheet for the test. LM to arrive 15 minutes early, and that it is imperative to arrive on time for appointment to keep from having the test rescheduled. If you need to cancel or reschedule your appointment, please call the office within 24 hours of your appointment. Failure to do so may result in a cancellation of your appointment, and a $50 no show fee. Phone number given for call back for any questions. Kyli Sorter, [place]e [place]

## 2019-02-22 ENCOUNTER — Other Ambulatory Visit: Payer: Self-pay

## 2019-02-22 ENCOUNTER — Ambulatory Visit (HOSPITAL_COMMUNITY): Payer: Managed Care, Other (non HMO) | Attending: Cardiology

## 2019-02-22 DIAGNOSIS — R0789 Other chest pain: Secondary | ICD-10-CM | POA: Diagnosis present

## 2019-02-22 DIAGNOSIS — I10 Essential (primary) hypertension: Secondary | ICD-10-CM | POA: Diagnosis present

## 2019-02-22 MED ORDER — TECHNETIUM TC 99M TETROFOSMIN IV KIT
33.0000 | PACK | Freq: Once | INTRAVENOUS | Status: AC | PRN
  Administered 2019-02-22: 33 via INTRAVENOUS
  Filled 2019-02-22: qty 33

## 2019-02-22 MED ORDER — REGADENOSON 0.4 MG/5ML IV SOLN
0.4000 mg | Freq: Once | INTRAVENOUS | Status: AC
  Administered 2019-02-22: 0.4 mg via INTRAVENOUS

## 2019-02-23 ENCOUNTER — Ambulatory Visit (HOSPITAL_COMMUNITY): Payer: Managed Care, Other (non HMO) | Attending: Cardiology

## 2019-02-23 LAB — MYOCARDIAL PERFUSION IMAGING
LV dias vol: 107 mL (ref 62–150)
LV sys vol: 47 mL
Peak HR: 95 {beats}/min
Rest HR: 69 {beats}/min
SDS: 0
SRS: 0
SSS: 0
TID: 1.02

## 2019-02-23 MED ORDER — TECHNETIUM TC 99M TETROFOSMIN IV KIT
32.0000 | PACK | Freq: Once | INTRAVENOUS | Status: AC | PRN
  Administered 2019-02-23: 32 via INTRAVENOUS
  Filled 2019-02-23: qty 32

## 2019-05-18 ENCOUNTER — Other Ambulatory Visit: Payer: Self-pay

## 2019-05-18 ENCOUNTER — Encounter: Payer: Self-pay | Admitting: Family Medicine

## 2019-05-18 ENCOUNTER — Ambulatory Visit: Payer: Managed Care, Other (non HMO) | Admitting: Family Medicine

## 2019-05-18 VITALS — BP 140/78 | HR 79 | Temp 99.1°F | Wt 305.6 lb

## 2019-05-18 DIAGNOSIS — F411 Generalized anxiety disorder: Secondary | ICD-10-CM

## 2019-05-18 DIAGNOSIS — Z87898 Personal history of other specified conditions: Secondary | ICD-10-CM

## 2019-05-18 DIAGNOSIS — Z8042 Family history of malignant neoplasm of prostate: Secondary | ICD-10-CM

## 2019-05-18 MED ORDER — SERTRALINE HCL 100 MG PO TABS: 100.0000 mg | ORAL_TABLET | Freq: Every day | ORAL | 1 refills | Status: DC

## 2019-05-18 NOTE — Patient Instructions (Addendum)
   Continue counseling, increase sertraline to 100mg  per day. Hydroxyzine as needed.  Recheck 12/11 as scheduled.   Return to the clinic or go to the nearest emergency room if any of your symptoms worsen or new symptoms occur.   If you have lab work done today you will be contacted with your lab results within the next 2 weeks.  If you have not heard from [place] then please contact us. The fastest way to get your results is to register for My Chart.   IF you received an x-ray today, you will receive an invoice from [facility]. Please contact [facility] at [phone] with questions or concerns regarding your invoice.   IF you received labwork today, you will receive an invoice from [place]. Please contact [facility] at [phone] with questions or concerns regarding your invoice.   Our billing staff will not be able to assist you with questions regarding bills from these companies.  You will be contacted with the lab results as soon as they are available. The fastest way to get your results is to activate your My Chart account. Instructions are located on the last page of this paperwork. If you have not heard from [place] regarding the results in 2 weeks, please contact this office.

## 2019-05-18 NOTE — Progress Notes (Signed)
Subjective:    Patient ID: Brandon Rich, male    DOB: 03-17-1961, 58 y.o.   MRN: [id number]  HPI  Brandon Rich is a 58 y.o. male Presents today for: Chief Complaint  Patient presents with  . Follow-up    3 month f/u on anxiety GAD7=4 PHQ9=4 Patient therapist requested that we increase sertraline form 50 mg to 100 mg. Also would like to talk about the Stress test I had done in Aug and discuss my last urologist appt in Sept.   Anxiety:  Last discussed in July.  Improving some at that time but persistent anxiety symptoms.  Had ongoing treatment with therapist.  We increased him from 25 to 50 mg of Zoloft daily. Meeting with therapist every 2-3 weeks. Upcoming stressors - marital counseling and job changes - thought higher dose may be needed.  Tolerating 50mg  dose well. Anxiety spell about every 5 days.  No new side effects.  No SI/HI, no alcohol. Sleeping well with hydroxyzine 1/2 pill usually.   GAD 7 : Generalized Anxiety Score 05/18/2019 02/16/2019 12/29/2018  Nervous, Anxious, on Edge 0 1 0  Control/stop worrying 1 1 1   Worry too much - different things 1 1 1   Trouble relaxing 0 1 0  Restless 1 0 0  Easily annoyed or irritable 1 1 1   Afraid - awful might happen 0 1 1  Total GAD 7 Score 4 6 4     Depression screen [facility] 2/9 05/18/2019 02/16/2019 12/29/2018 12/01/2018 05/12/2018  Decreased Interest 0 0 1 3 0  Down, Depressed, Hopeless 0 1 1 3  0  PHQ - 2 Score 0 1 2 6  0  Altered sleeping 1 1 0 3 -  Tired, decreased energy 1 1 1 2  -  Change in appetite 1 0 0 2 -  Feeling bad or failure about yourself  1 1 1 3  -  Trouble concentrating 0 1 0 0 -  Moving slowly or fidgety/restless 0 0 0 0 -  Suicidal thoughts 0 0 0 0 -  PHQ-9 Score 4 5 4 16  -  Difficult doing work/chores - - Not difficult at all Very difficult -    History of chest pain.  Evaluated in July by Dr. Percival Spanish.  Lexiscan Myoview August 7.  Negative perfusion study.  No further work-up of ischemia needed at  that time.  No chest pains.  Lisinopril now at 40mg  qd. No new side effects.   BP Readings from Last 3 Encounters:  05/18/19 140/78  02/16/19 129/76  02/02/19 (!) 156/86    Elevated PSA: Referred to urology for rising PSA, evaluated by Dr. Alinda Money on September 4.  Family history of prostate cancer in his father.  Some baseline lower urinary tract symptoms, IPSS  12.  No nodules were appreciated on exam by urology.  Repeat PSA was ordered, option of biopsy given family history if elevated.  No specific intervention for his lower urinary tract symptoms.  Sildenafil prescribed for erectile dysfunction as initial trial.  Lower on retest at urology. In 1 range. Not sure of plan but ok with monitoring in 6 months.    Patient Active Problem List   Diagnosis Date Noted  . Chest discomfort 02/02/2019  . Morbid obesity ([place]) 02/02/2019  . Pain in right hand 02/15/2018  . Trigger finger 02/15/2018  . Cortical age-related cataract of both eyes 03/30/2017  . Nuclear sclerotic cataract of both eyes 03/30/2017  . Primary open angle glaucoma (POAG) of both eyes, indeterminate stage  03/30/2017  . Type 2 diabetes mellitus without complication, without long-term current use of insulin ([place]) 03/30/2017  . LVH (left ventricular hypertrophy) 03/29/2016  . FUO (fever of unknown origin) 02/16/2016  . Acute combined systolic and diastolic heart failure ([place]) 02/16/2016  . Weight gain 02/16/2016  . Weight loss 02/16/2016  . Rash and nonspecific skin eruption 02/16/2016  . DOE (dyspnea on exertion) 02/16/2016  . T wave inversion in EKG 02/16/2016  . Transaminitis 02/16/2016  . Arthritis of big toe 08/23/2013  . Pain of left great toe 08/23/2013  . HTN (hypertension) 12/20/2011  . Diabetes mellitus ([place]) 12/20/2011  . Dyslipidemia 12/20/2011  . Glaucoma (increased eye pressure) 07/19/1996   Past Medical History:  Diagnosis Date  . Acute combined systolic and diastolic heart failure ([place]) 02/16/2016  .  Allergy   . Diabetes mellitus   . Diabetic foot ulcer ([place])   . FUO (fever of unknown origin) 02/16/2016  . Glaucoma   . Hyperlipidemia   . Hypertension   . LVH (left ventricular hypertrophy) 03/29/2016  . Rash and nonspecific skin eruption 02/16/2016  . Sleep apnea    CPAP  . Transaminitis 02/16/2016   Past Surgical History:  Procedure Laterality Date  . HERNIA REPAIR    . SPINE SURGERY    . TUMOR REMOVAL     Bone Cyst   No Known Allergies Prior to Admission medications   Medication Sig Start Date End Date Taking? Authorizing Provider  Ascorbic Acid (VITAMIN C) 1000 MG tablet Take 1,000 mg by mouth daily.   Yes [provider]  aspirin 81 MG tablet Take 81 mg by mouth daily.   Yes [provider]  Aspirin-Calcium Carbonate 81-777 MG TABS Take by mouth.   Yes [provider]  atorvastatin (LIPITOR) 20 MG tablet Take 1 tablet (20 mg total) by mouth daily. 12/01/18  Yes [name], MD  BD PEN NEEDLE NANO U/F 32G X 4 MM MISC  07/04/18  Yes [provider]  brimonidine (ALPHAGAN) 0.2 % ophthalmic solution Place 1 drop into both eyes 2 (two) times daily.   Yes [provider]  calcium carbonate (TUMS - DOSED IN MG ELEMENTAL CALCIUM) 500 MG chewable tablet Chew 2 tablets by mouth daily as needed for indigestion or heartburn.   Yes [provider]  Cholecalciferol (VITAMIN D-1000 MAX ST) 1000 units tablet Take by mouth.   Yes [provider]  empagliflozin (JARDIANCE) 25 MG TABS tablet Take 25 mg by mouth daily. 12/16/16  Yes [name], MD  fexofenadine (ALLEGRA) 180 MG tablet Take 180 mg by mouth daily.   Yes [provider]  fluticasone (FLONASE) 50 MCG/ACT nasal spray PLACE 2 SPRAYS INTO BOTH NOSTRILS DAILY. 07/09/14  Yes GuestBenn Moulder, MD  hydrOXYzine (ATARAX/VISTARIL) 25 MG tablet Take 0.5-1 tablets (12.5-25 mg total) by mouth 3 (three) times daily as needed for anxiety (or sleep). 12/29/18  Yes [name], MD  Insulin Glargine [facility]) 100 UNIT/ML SOPN Inject into the skin. 03/07/17  Yes [provider]  lisinopril (ZESTRIL) 40 MG tablet Take 1 tablet (40 mg total) by mouth daily. 02/02/19  Yes Minus Breeding, MD  metFORMIN (GLUCOPHAGE) 1000 MG tablet Take 1 tablet (1,000 mg total) by mouth 2 (two) times daily with a meal. 12/29/18  Yes [name], MD  Multiple Vitamin (MULTIVITAMIN) tablet Take 1 tablet by mouth every evening.    Yes [provider]  Semaglutide,0.25 or 0.5MG /DOS, (OZEMPIC, 0.25 OR  0.5 MG/DOSE,) 2 MG/1.5ML SOPN Inject into the skin.   Yes [provider]  sertraline (ZOLOFT) 50 MG tablet Take 1 tablet (50 mg total) by mouth daily. 02/16/19  Yes [name], MD  silver sulfADIAZINE (SILVADENE) 1 % cream Apply pea-sized amount to wound daily. 07/04/18  Yes Evelina Bucy, DPM   Social History   Socioeconomic History  . Marital status: Married    Spouse name: Not on file  . Number of children: 2  . Years of education: 32  . Highest education level: Not on file  Occupational History  . Occupation: Tour manager  Social Needs  . Financial resource strain: Not on file  . Food insecurity    Worry: Not on file    Inability: Not on file  . Transportation needs    Medical: Not on file    Non-medical: Not on file  Tobacco Use  . Smoking status: Never Smoker  . Smokeless tobacco: Never Used  Substance and Sexual Activity  . Alcohol use: No    Alcohol/week: 0.0 standard drinks    Comment: 1 drink  . Drug use: No  . Sexual activity: Yes    Partners: Female  Lifestyle  . Physical activity    Days per week: Not on file    Minutes per session: Not on file  . Stress: Not on file  Relationships  . Social Herbalist on phone: Not on file    Gets together: Not on file    Attends religious service: Not on file    Active member of club or organization: Not on file    Attends meetings of clubs or  organizations: Not on file    Relationship status: Not on file  . Intimate partner violence    Fear of current or ex partner: Not on file    Emotionally abused: Not on file    Physically abused: Not on file    Forced sexual activity: Not on file  Other Topics Concern  . Not on file  Social History Narrative   Married. Education: [employer].     Review of Systems Per HPi.     Objective:   Physical Exam Vitals signs reviewed.  Constitutional:      Appearance: He is well-developed. He is obese.  HENT:     Head: Normocephalic and atraumatic.  Eyes:     Pupils: Pupils are equal, round, and reactive to light.  Neck:     Vascular: No carotid bruit or JVD.  Cardiovascular:     Rate and Rhythm: Normal rate and regular rhythm.     Heart sounds: Normal heart sounds. No murmur.  Pulmonary:     Effort: Pulmonary effort is normal.     Breath sounds: Normal breath sounds. No rales.  Skin:    General: Skin is warm and dry.  Neurological:     Mental Status: He is alert and oriented to person, place, and time.  Psychiatric:        Mood and Affect: Mood normal.        Behavior: Behavior normal.        Thought Content: Thought content normal.    Vitals:   05/18/19 1411  BP: 140/78  Pulse: 79  Temp: 99.1 F (37.3 C)  TempSrc: Oral  SpO2: 97%  Weight: (!) 305 lb 9.6 oz (138.6 kg)       Assessment & Plan:   Brandon Rich is a 58 y.o. male Anxiety  state - Plan: sertraline (ZOLOFT) 100 MG tablet  - improved, some increased stressors pending.  Would like to try higher dose of Zoloft, increase to 100 mg as above.  Continue follow-up with counseling.  Family history of prostate cancer  -Evaluated by urology as above.  History of chest pain  -Asymptomatic, stress testing reassuring.  Meds ordered this encounter  Medications  . sertraline (ZOLOFT) 100 MG tablet    Sig: Take 1 tablet (100 mg total) by mouth daily.    Dispense:  90 tablet    Refill:  1    Ok to place on hold  until needed.   Patient Instructions     Continue counseling, increase sertraline to 100mg  per day. Hydroxyzine as needed.  Recheck 12/11 as scheduled.   Return to the clinic or go to the nearest emergency room if any of your symptoms worsen or new symptoms occur.   If you have lab work done today you will be contacted with your lab results within the next 2 weeks.  If you have not heard from [place] then please contact us. The fastest way to get your results is to register for My Chart.   IF you received an x-ray today, you will receive an invoice from [facility]. Please contact [facility] at [phone] with questions or concerns regarding your invoice.   IF you received labwork today, you will receive an invoice from [place]. Please contact [facility] at [phone] with questions or concerns regarding your invoice.   Our billing staff will not be able to assist you with questions regarding bills from these companies.  You will be contacted with the lab results as soon as they are available. The fastest way to get your results is to activate your My Chart account. Instructions are located on the last page of this paperwork. If you have not heard from [place] regarding the results in 2 weeks, please contact this office.       Signed,   [name], MD [facility] [place].  05/20/19 2:32 PM

## 2019-05-20 ENCOUNTER — Encounter: Payer: Self-pay | Admitting: Family Medicine

## 2019-06-29 ENCOUNTER — Other Ambulatory Visit: Payer: Self-pay

## 2019-06-29 ENCOUNTER — Encounter: Payer: Self-pay | Admitting: Family Medicine

## 2019-06-29 ENCOUNTER — Ambulatory Visit (INDEPENDENT_AMBULATORY_CARE_PROVIDER_SITE_OTHER): Payer: Managed Care, Other (non HMO) | Admitting: Family Medicine

## 2019-06-29 VITALS — BP 136/80 | HR 85 | Temp 98.0°F | Wt 305.2 lb

## 2019-06-29 DIAGNOSIS — I10 Essential (primary) hypertension: Secondary | ICD-10-CM | POA: Diagnosis not present

## 2019-06-29 DIAGNOSIS — E785 Hyperlipidemia, unspecified: Secondary | ICD-10-CM

## 2019-06-29 DIAGNOSIS — F5104 Psychophysiologic insomnia: Secondary | ICD-10-CM | POA: Diagnosis not present

## 2019-06-29 DIAGNOSIS — E119 Type 2 diabetes mellitus without complications: Secondary | ICD-10-CM

## 2019-06-29 DIAGNOSIS — Z794 Long term (current) use of insulin: Secondary | ICD-10-CM

## 2019-06-29 DIAGNOSIS — Z6841 Body Mass Index (BMI) 40.0 and over, adult: Secondary | ICD-10-CM

## 2019-06-29 DIAGNOSIS — F411 Generalized anxiety disorder: Secondary | ICD-10-CM | POA: Diagnosis not present

## 2019-06-29 DIAGNOSIS — F4323 Adjustment disorder with mixed anxiety and depressed mood: Secondary | ICD-10-CM

## 2019-06-29 MED ORDER — METFORMIN HCL 1000 MG PO TABS: 1000.0000 mg | ORAL_TABLET | Freq: Two times a day (BID) | ORAL | 1 refills | Status: DC

## 2019-06-29 MED ORDER — HYDROXYZINE HCL 25 MG PO TABS: 12.5000 mg | ORAL_TABLET | Freq: Three times a day (TID) | ORAL | 1 refills | Status: DC | PRN

## 2019-06-29 NOTE — Progress Notes (Signed)
Subjective:  Patient ID: Brandon Rich, male    DOB: 1961-04-11  Age: 58 y.o. MRN: [id number]  CC:  Chief Complaint  Patient presents with  . Medical Management of Chronic Issues    6 month med review. Last visit increase sertraline to 100mg . DId have some dizziness when upped dose but now its better. GAD7-2. Last A1c by DR Cur was 7.6 a month ago    HPI Brandon Rich presents for   Medication as needed.  Followed by Dr.Kerr for diabetes, with reported A1c of 7.6 months ago.  Takes Metformin, Jardiance, insulin, Ozempic.  On ACE inhibitor and statin.  Up-to-date on diabetic health maintenance.  Obesity: Body mass index is 45.07 kg/m.  Wt Readings from Last 3 Encounters:  06/29/19 (!) 305 lb 3.2 oz (138.4 kg)  05/18/19 (!) 305 lb 9.6 oz (138.6 kg)  02/22/19 (!) 302 lb (137 kg)  No regular exercise. Still dealing with foot issue  Occasional fast food with traveling. No soda/sweet tea.  Hypertension: Lisinopril 40 mg daily. Has refills.  Temporary dizziness with high dose zoloft for few weeks, episodic only - better now. No other new side effects.  Home readings:130's/80.  BP Readings from Last 3 Encounters:  06/29/19 136/80  05/18/19 140/78  02/16/19 129/76   Lab Results  Component Value Date   CREATININE 1.04 12/01/2018   Hyperlipidemia: Lipitor 20 mg daily. No new myalgias/side effects. Has refills.  Lab Results  Component Value Date   CHOL 87 (L) 12/01/2018   HDL 30 (L) 12/01/2018   LDLCALC 34 12/01/2018   TRIG 114 12/01/2018   CHOLHDL 2.9 12/01/2018   Lab Results  Component Value Date   ALT 26 12/01/2018   AST 44 (H) 12/01/2018   ALKPHOS 68 12/01/2018   BILITOT 0.5 12/01/2018   Anxiety:  Last discussed in October, Zoloft increased to 100 mg with increased stressors.  Continued counseling  - has appt today.  Initial dizziness as above, now resolved.  Feeling well - like current dose zoloft. Less worry. Sleeping well with hydroxyzine 25mg .    Depression screen [facility] 2/9 06/29/2019 05/18/2019 02/16/2019 12/29/2018 12/01/2018  Decreased Interest - 0 0 1 3  Down, Depressed, Hopeless - 0 1 1 3   PHQ - 2 Score - 0 1 2 6   Altered sleeping 0 1 1 0 3  Tired, decreased energy 0 1 1 1 2   Change in appetite 0 1 0 0 2  Feeling bad or failure about yourself  0 1 1 1 3   Trouble concentrating 0 0 1 0 0  Moving slowly or fidgety/restless 0 0 0 0 0  Suicidal thoughts 0 0 0 0 0  PHQ-9 Score - 4 5 4 16   Difficult doing work/chores - - - Not difficult at all Very difficult   GAD 7 : Generalized Anxiety Score 06/29/2019 05/18/2019 02/16/2019 12/29/2018  Nervous, Anxious, on Edge 0 0 1 0  Control/stop worrying 1 1 1 1   Worry too much - different things 1 1 1 1   Trouble relaxing 0 0 1 0  Restless 0 1 0 0  Easily annoyed or irritable 0 1 1 1   Afraid - awful might happen 0 0 1 1  Total GAD 7 Score 2 4 6 4        History Patient Active Problem List   Diagnosis Date Noted  . Chest discomfort 02/02/2019  . Morbid obesity ([place]) 02/02/2019  . Pain in right hand 02/15/2018  . Trigger finger 02/15/2018  .  Cortical age-related cataract of both eyes 03/30/2017  . Nuclear sclerotic cataract of both eyes 03/30/2017  . Primary open angle glaucoma (POAG) of both eyes, indeterminate stage 03/30/2017  . Type 2 diabetes mellitus without complication, without long-term current use of insulin ([place]) 03/30/2017  . LVH (left ventricular hypertrophy) 03/29/2016  . FUO (fever of unknown origin) 02/16/2016  . Acute combined systolic and diastolic heart failure ([place]) 02/16/2016  . Weight gain 02/16/2016  . Weight loss 02/16/2016  . Rash and nonspecific skin eruption 02/16/2016  . DOE (dyspnea on exertion) 02/16/2016  . T wave inversion in EKG 02/16/2016  . Transaminitis 02/16/2016  . Arthritis of big toe 08/23/2013  . Pain of left great toe 08/23/2013  . HTN (hypertension) 12/20/2011  . Diabetes mellitus ([place]) 12/20/2011  . Dyslipidemia 12/20/2011  .  Glaucoma (increased eye pressure) 07/19/1996   Past Medical History:  Diagnosis Date  . Acute combined systolic and diastolic heart failure ([place]) 02/16/2016  . Allergy   . Diabetes mellitus   . Diabetic foot ulcer ([place])   . FUO (fever of unknown origin) 02/16/2016  . Glaucoma   . Hyperlipidemia   . Hypertension   . LVH (left ventricular hypertrophy) 03/29/2016  . Rash and nonspecific skin eruption 02/16/2016  . Sleep apnea    CPAP  . Transaminitis 02/16/2016   Past Surgical History:  Procedure Laterality Date  . HERNIA REPAIR    . SPINE SURGERY    . TUMOR REMOVAL     Bone Cyst   No Known Allergies Prior to Admission medications   Medication Sig Start Date End Date Taking? Authorizing Provider  Ascorbic Acid (VITAMIN C) 1000 MG tablet Take 1,000 mg by mouth daily.   Yes [provider]  aspirin 81 MG tablet Take 81 mg by mouth daily.   Yes [provider]  atorvastatin (LIPITOR) 20 MG tablet Take 1 tablet (20 mg total) by mouth daily. 12/01/18  Yes [name], MD  BD PEN NEEDLE NANO U/F 32G X 4 MM MISC  07/04/18  Yes [provider]  brimonidine (ALPHAGAN) 0.2 % ophthalmic solution Place 1 drop into both eyes 2 (two) times daily.   Yes [provider]  calcium carbonate (TUMS - DOSED IN MG ELEMENTAL CALCIUM) 500 MG chewable tablet Chew 2 tablets by mouth daily as needed for indigestion or heartburn.   Yes [provider]  Cholecalciferol (VITAMIN D-1000 MAX ST) 1000 units tablet Take by mouth.   Yes [provider]  empagliflozin (JARDIANCE) 25 MG TABS tablet Take 25 mg by mouth daily. 12/16/16  Yes [name], MD  fexofenadine (ALLEGRA) 180 MG tablet Take 180 mg by mouth daily.   Yes [provider]  fluticasone (FLONASE) 50 MCG/ACT nasal spray PLACE 2 SPRAYS INTO BOTH NOSTRILS DAILY. 07/09/14  Yes GuestBenn Moulder, MD  hydrOXYzine (ATARAX/VISTARIL) 25 MG tablet Take 0.5-1 tablets (12.5-25 mg total) by mouth 3  (three) times daily as needed for anxiety (or sleep). 12/29/18  Yes [name], MD  Insulin Glargine [facility]) 100 UNIT/ML SOPN Inject into the skin. 03/07/17  Yes [provider]  lisinopril (ZESTRIL) 40 MG tablet Take 1 tablet (40 mg total) by mouth daily. 02/02/19  Yes Minus Breeding, MD  metFORMIN (GLUCOPHAGE) 1000 MG tablet Take 1 tablet (1,000 mg total) by mouth 2 (two) times daily with a meal. 12/29/18  Yes [name], MD  Multiple Vitamin (MULTIVITAMIN) tablet Take 1 tablet by mouth every evening.  Yes [provider]  Semaglutide,0.25 or 0.5MG /DOS, (OZEMPIC, 0.25 OR 0.5 MG/DOSE,) 2 MG/1.5ML SOPN Inject into the skin.   Yes [provider]  sertraline (ZOLOFT) 100 MG tablet Take 1 tablet (100 mg total) by mouth daily. 05/18/19  Yes [name], MD  silver sulfADIAZINE (SILVADENE) 1 % cream Apply pea-sized amount to wound daily. 07/04/18  Yes Evelina Bucy, DPM   Social History   Socioeconomic History  . Marital status: Married    Spouse name: Not on file  . Number of children: 2  . Years of education: 92  . Highest education level: Not on file  Occupational History  . Occupation: Tour manager  Tobacco Use  . Smoking status: Never Smoker  . Smokeless tobacco: Never Used  Substance and Sexual Activity  . Alcohol use: No    Alcohol/week: 0.0 standard drinks    Comment: 1 drink  . Drug use: No  . Sexual activity: Yes    Partners: Female  Other Topics Concern  . Not on file  Social History Narrative   Married. Education: [employer].    Social Determinants of Health   Financial Resource Strain:   . Difficulty of Paying Living Expenses: Not on file  Food Insecurity:   . Worried About Charity fundraiser in the Last Year: Not on file  . Ran Out of Food in the Last Year: Not on file  Transportation Needs:   . Lack of Transportation (Medical): Not on file  . Lack of Transportation (Non-Medical): Not on file   Physical Activity:   . Days of Exercise per Week: Not on file  . Minutes of Exercise per Session: Not on file  Stress:   . Feeling of Stress : Not on file  Social Connections:   . Frequency of Communication with Friends and Family: Not on file  . Frequency of Social Gatherings with Friends and Family: Not on file  . Attends Religious Services: Not on file  . Active Member of Clubs or Organizations: Not on file  . Attends Archivist Meetings: Not on file  . Marital Status: Not on file  Intimate Partner Violence:   . Fear of Current or Ex-Partner: Not on file  . Emotionally Abused: Not on file  . Physically Abused: Not on file  . Sexually Abused: Not on file    Review of Systems  Constitutional: Negative for fatigue and unexpected weight change.  Eyes: Negative for visual disturbance.  Respiratory: Negative for cough, chest tightness and shortness of breath.   Cardiovascular: Negative for chest pain, palpitations and leg swelling.  Gastrointestinal: Negative for abdominal pain and blood in stool.  Neurological: Negative for dizziness, light-headedness and headaches.    Per HPI.  Objective:   Vitals:   06/29/19 0816  BP: 136/80  Pulse: 85  Temp: 98 F (36.7 C)  TempSrc: Oral  SpO2: 97%  Weight: (!) 305 lb 3.2 oz (138.4 kg)     Physical Exam Vitals reviewed.  Constitutional:      Appearance: He is well-developed.  HENT:     Head: Normocephalic and atraumatic.  Eyes:     Pupils: Pupils are equal, round, and reactive to light.  Neck:     Vascular: No carotid bruit or JVD.  Cardiovascular:     Rate and Rhythm: Normal rate and regular rhythm.     Heart sounds: Normal heart sounds. No murmur.  Pulmonary:     Effort: Pulmonary effort is normal.  Breath sounds: Normal breath sounds. No rales.  Skin:    General: Skin is warm and dry.  Neurological:     Mental Status: He is alert and oriented to person, place, and time.        Assessment & Plan:   Brandon Rich is a 58 y.o. male . Anxiety state Psychophysiological insomnia  -Stable.  Continue Zoloft same dose, hydroxyzine at night.  Recheck 6 months  Essential hypertension  -Borderline but stable.  Anticipate improvement with weight loss.  Continue same dose lisinopril - has refills.  Hyperlipidemia, unspecified hyperlipidemia type  -Continue Lipitor same dose.  BMI 45.0-49.9, adult ([place])   -Low intensity exercise when able with foot issues.  Continue to avoid sugar-containing beverages, minimize fast food.     Meds ordered this encounter  Medications  . hydrOXYzine (ATARAX/VISTARIL) 25 MG tablet    Sig: Take 0.5-1 tablets (12.5-25 mg total) by mouth 3 (three) times daily as needed for anxiety (or sleep).    Dispense:  90 tablet    Refill:  1  . metFORMIN (GLUCOPHAGE) 1000 MG tablet    Sig: Take 1 tablet (1,000 mg total) by mouth 2 (two) times daily with a meal.    Dispense:  180 tablet    Refill:  1   Patient Instructions   No medication changes at this time.  Thank you for coming in today.  Follow-up in 6 months for a physical.    If you have lab work done today you will be contacted with your lab results within the next 2 weeks.  If you have not heard from [place] then please contact us. The fastest way to get your results is to register for My Chart.   IF you received an x-ray today, you will receive an invoice from [facility]. Please contact [facility] at [phone] with questions or concerns regarding your invoice.   IF you received labwork today, you will receive an invoice from [place]. Please contact [facility] at [phone] with questions or concerns regarding your invoice.   Our billing staff will not be able to assist you with questions regarding bills from these companies.  You will be contacted with the lab results as soon as they are available. The fastest way to get your results is to activate your My Chart account.  Instructions are located on the last page of this paperwork. If you have not heard from [place] regarding the results in 2 weeks, please contact this office.          Signed, [name], MD [facility]

## 2019-06-29 NOTE — Patient Instructions (Addendum)
No medication changes at this time.  Thank you for coming in today.  Follow-up in 6 months for a physical.    If you have lab work done today you will be contacted with your lab results within the next 2 weeks.  If you have not heard from [place] then please contact us. The fastest way to get your results is to register for My Chart.   IF you received an x-ray today, you will receive an invoice from [facility]. Please contact [facility]iology at [phone] with questions or concerns regarding your invoice.   IF you received labwork today, you will receive an invoice from [place]. Please contact [facility] at [phone] with questions or concerns regarding your invoice.   Our billing staff will not be able to assist you with questions regarding bills from these companies.  You will be contacted with the lab results as soon as they are available. The fastest way to get your results is to activate your My Chart account. Instructions are located on the last page of this paperwork. If you have not heard from [place] regarding the results in 2 weeks, please contact this office.

## 2019-06-29 NOTE — Addendum Note (Signed)
Addended by: Meredeth Ide on: 06/29/2019 11:40 AM   Modules accepted: Orders

## 2019-06-30 LAB — COMPREHENSIVE METABOLIC PANEL
ALT: 26 IU/L (ref 0–44)
AST: 50 IU/L — ABNORMAL HIGH (ref 0–40)
Albumin/Globulin Ratio: 1.9 (ref 1.2–2.2)
Albumin: 4.6 g/dL (ref 3.8–4.9)
Alkaline Phosphatase: 75 IU/L (ref 39–117)
BUN/Creatinine Ratio: 13 (ref 9–20)
BUN: 12 mg/dL (ref 6–24)
Bilirubin Total: 0.3 mg/dL (ref 0.0–1.2)
CO2: 17 mmol/L — ABNORMAL LOW (ref 20–29)
Calcium: 9.4 mg/dL (ref 8.7–10.2)
Chloride: 104 mmol/L (ref 96–106)
Creatinine, Ser: 0.95 mg/dL (ref 0.76–1.27)
GFR calc Af Amer: 101 mL/min/{1.73_m2} (ref >59)
GFR calc non Af Amer: 87 mL/min/{1.73_m2} (ref >59)
Globulin, Total: 2.4 g/dL (ref 1.5–4.5)
Glucose: 193 mg/dL — ABNORMAL HIGH (ref 65–99)
Potassium: 4 mmol/L (ref 3.5–5.2)
Sodium: 141 mmol/L (ref 134–144)
Total Protein: 7 g/dL (ref 6.0–8.5)

## 2019-06-30 LAB — LIPID PANEL
Chol/HDL Ratio: 3.6 ratio (ref 0.0–5.0)
Cholesterol, Total: 102 mg/dL (ref 100–199)
HDL: 28 mg/dL — ABNORMAL LOW (ref >39)
LDL Chol Calc (NIH): 44 mg/dL (ref 0–99)
Triglycerides: 178 mg/dL — ABNORMAL HIGH (ref 0–149)
VLDL Cholesterol Cal: 30 mg/dL (ref 5–40)

## 2019-08-05 ENCOUNTER — Other Ambulatory Visit: Payer: Self-pay

## 2019-08-05 ENCOUNTER — Encounter (HOSPITAL_COMMUNITY): Payer: Self-pay

## 2019-08-05 ENCOUNTER — Ambulatory Visit (HOSPITAL_COMMUNITY)
Admission: EM | Admit: 2019-08-05 | Discharge: 2019-08-05 | Disposition: A | Discharge status: Home / Self Care | Payer: Managed Care, Other (non HMO) | Attending: Urgent Care | Admitting: Urgent Care

## 2019-08-05 DIAGNOSIS — H1032 Unspecified acute conjunctivitis, left eye: Secondary | ICD-10-CM

## 2019-08-05 DIAGNOSIS — H409 Unspecified glaucoma: Secondary | ICD-10-CM

## 2019-08-05 DIAGNOSIS — H5712 Ocular pain, left eye: Secondary | ICD-10-CM

## 2019-08-05 MED ORDER — ERYTHROMYCIN 5 MG/GM OP OINT
TOPICAL_OINTMENT | OPHTHALMIC | Status: AC
  Filled 2019-08-05: qty 3.5

## 2019-08-05 MED ORDER — TETRACAINE HCL 0.5 % OP SOLN
OPHTHALMIC | Status: AC
  Filled 2019-08-05: qty 4

## 2019-08-05 MED ORDER — ERYTHROMYCIN 5 MG/GM OP OINT: TOPICAL_OINTMENT | OPHTHALMIC | Status: DC

## 2019-08-05 NOTE — ED Provider Notes (Signed)
[place]   MRN: [id number] DOB: [birth year]  Subjective:   Brandon Rich is a 59 y.o. male presenting for 1 day hx left eye redness, pain with eye movement. Has a hx of glaucoma, is currently due to take his drops. Works closely with an ophthalmologist. Denies eye trauma, foreign body sensation, wearing contact lenses. Usually his eye pressure is ~54mmHg.   No current facility-administered medications for this encounter.  Current Outpatient Medications:  .  Ascorbic Acid (VITAMIN C) 1000 MG tablet, Take 1,000 mg by mouth daily., Disp: , Rfl:  .  aspirin 81 MG tablet, Take 81 mg by mouth daily., Disp: , Rfl:  .  atorvastatin (LIPITOR) 20 MG tablet, Take 1 tablet (20 mg total) by mouth daily., Disp: 90 tablet, Rfl: 2 .  BD PEN NEEDLE NANO U/F 32G X 4 MM MISC, , Disp: , Rfl:  .  brimonidine (ALPHAGAN) 0.2 % ophthalmic solution, Place 1 drop into both eyes 2 (two) times daily., Disp: , Rfl:  .  calcium carbonate (TUMS - DOSED IN MG ELEMENTAL CALCIUM) 500 MG chewable tablet, Chew 2 tablets by mouth daily as needed for indigestion or heartburn., Disp: , Rfl:  .  Cholecalciferol (VITAMIN D-1000 MAX ST) 1000 units tablet, Take by mouth., Disp: , Rfl:  .  empagliflozin (JARDIANCE) 25 MG TABS tablet, Take 25 mg by mouth daily., Disp: 30 tablet, Rfl: 3 .  fexofenadine (ALLEGRA) 180 MG tablet, Take 180 mg by mouth daily., Disp: , Rfl:  .  fluticasone (FLONASE) 50 MCG/ACT nasal spray, PLACE 2 SPRAYS INTO BOTH NOSTRILS DAILY., Disp: 16 g, Rfl: 10 .  hydrOXYzine (ATARAX/VISTARIL) 25 MG tablet, Take 0.5-1 tablets (12.5-25 mg total) by mouth 3 (three) times daily as needed for anxiety (or sleep)., Disp: 90 tablet, Rfl: 1 .  Insulin Glargine (BASAGLAR KWIKPEN) 100 UNIT/ML SOPN, Inject into the skin., Disp: , Rfl:  .  lisinopril (ZESTRIL) 40 MG tablet, Take 1 tablet (40 mg total) by mouth daily., Disp: 90 tablet, Rfl: 3 .  metFORMIN (GLUCOPHAGE) 1000 MG tablet, Take 1 tablet (1,000 mg total) by  mouth 2 (two) times daily with a meal., Disp: 180 tablet, Rfl: 1 .  Multiple Vitamin (MULTIVITAMIN) tablet, Take 1 tablet by mouth every evening. , Disp: , Rfl:  .  Semaglutide,0.25 or 0.5MG /DOS, (OZEMPIC, 0.25 OR 0.5 MG/DOSE,) 2 MG/1.5ML SOPN, Inject into the skin., Disp: , Rfl:  .  sertraline (ZOLOFT) 100 MG tablet, Take 1 tablet (100 mg total) by mouth daily., Disp: 90 tablet, Rfl: 1 .  silver sulfADIAZINE (SILVADENE) 1 % cream, Apply pea-sized amount to wound daily., Disp: 50 g, Rfl: 0   Allergies  Allergen Reactions  . Other Other (See Comments)    Hayfever    Past Medical History:  Diagnosis Date  . Acute combined systolic and diastolic heart failure ([place]) 02/16/2016  . Allergy   . Diabetes mellitus   . Diabetic foot ulcer ([place])   . FUO (fever of unknown origin) 02/16/2016  . Glaucoma   . Hyperlipidemia   . Hypertension   . LVH (left ventricular hypertrophy) 03/29/2016  . Rash and nonspecific skin eruption 02/16/2016  . Sleep apnea    CPAP  . Transaminitis 02/16/2016     Past Surgical History:  Procedure Laterality Date  . HERNIA REPAIR    . SPINE SURGERY    . TUMOR REMOVAL     Bone Cyst    Family History  Problem Relation Age of Onset  . Skin cancer  Mother   . Cancer Mother   . Heart disease Mother        Atrial Fib  . Hypertension Mother   . Cancer Father   . Heart disease Father        Valve Disease  . Heart disease Maternal Grandmother   . Hyperlipidemia Maternal Grandmother   . Hypertension Maternal Grandmother   . Heart disease Maternal Grandfather   . Heart disease Paternal Grandfather   . Hypertension Paternal Grandfather   . Hyperlipidemia Paternal Grandfather     Social History   Tobacco Use  . Smoking status: Never Smoker  . Smokeless tobacco: Never Used  Substance Use Topics  . Alcohol use: No    Alcohol/week: 0.0 standard drinks    Comment: 1 drink  . Drug use: No    ROS   Objective:   Vitals: BP 130/67 (BP Location: Right Arm)    Pulse 83   Temp 98.4 F (36.9 C) (Oral)   Resp 20   SpO2 98%   Physical Exam Constitutional:      General: He is not in acute distress.    Appearance: Normal appearance. He is well-developed and normal weight. He is not ill-appearing, toxic-appearing or diaphoretic.  HENT:     Head: Normocephalic and atraumatic.     Right Ear: External ear normal.     Left Ear: External ear normal.     Nose: Nose normal.     Mouth/Throat:     Pharynx: Oropharynx is clear.  Eyes:     General: Lids are everted, no foreign bodies appreciated. No scleral icterus.       Right eye: No discharge.        Left eye: No foreign body, discharge or hordeolum.     Extraocular Movements: Extraocular movements intact.     Conjunctiva/sclera:     Left eye: Left conjunctiva is injected. No chemosis, exudate or hemorrhage.    Pupils: Pupils are equal, round, and reactive to light.     Comments: Tonometer readings were 13, 21, 25.   Cardiovascular:     Rate and Rhythm: Normal rate.  Pulmonary:     Effort: Pulmonary effort is normal.  Musculoskeletal:     Cervical back: Normal range of motion.  Neurological:     Mental Status: He is alert and oriented to person, place, and time.  Psychiatric:        Mood and Affect: Mood normal.        Behavior: Behavior normal.        Thought Content: Thought content normal.        Judgment: Judgment normal.     Eye Exam: Eyelids everted and swept for foreign body. The eye was anesthetized with 2 drops of tetracaine and stained with fluorescein. Examination under woods lamp does not reveal a foreign body or area of increased stain uptake. The eye was then irrigated copiously with saline.   Assessment and Plan :   1. Acute conjunctivitis of left eye, unspecified acute conjunctivitis type   2. Left eye pain   3. Glaucoma of left eye, unspecified glaucoma type     Will cover for conjunctivitis with erythromycin ointment.  Patient is to contact his ophthalmologist  tomorrow for recheck on his glaucoma.  Strict ER precautions. Counseled patient on potential for adverse effects with medications prescribed/recommended today, ER and return-to-clinic precautions discussed, patient verbalized understanding.    Jaynee Eagles, PA-C 08/05/19 1709

## 2019-08-05 NOTE — ED Triage Notes (Signed)
Pt presents to UC with redness and pain and the left eye x 1 day. Pt states the left eye is painful when he blinks. Pt [place] using Visine eye drops.

## 2019-08-05 NOTE — Discharge Instructions (Addendum)
I will cover for bacterial conjunctivitis with erythromycin ointment. Please make sure you contact your ophthalmologist first thing tomorrow. If your eye pain worsens or start to have vision loss, please report to the ER immediately, do not wait to see your eye doctor.

## 2019-09-15 ENCOUNTER — Other Ambulatory Visit: Payer: Self-pay | Admitting: Family Medicine

## 2019-09-15 DIAGNOSIS — I10 Essential (primary) hypertension: Secondary | ICD-10-CM

## 2019-09-15 DIAGNOSIS — E785 Hyperlipidemia, unspecified: Secondary | ICD-10-CM

## 2019-09-15 NOTE — Telephone Encounter (Signed)
Requested Prescriptions  Pending Prescriptions Disp Refills  . atorvastatin (LIPITOR) 20 MG tablet [Pharmacy Med Name: ATORVASTATIN 20 MG TABLET] 90 tablet 1    Sig: TAKE ONE TABLET BY MOUTH DAILY     Cardiovascular:  Antilipid - Statins Failed - 09/15/2019  8:44 AM      Failed - HDL in normal range and within 360 days    HDL  Date Value Ref Range Status  06/29/2019 28 (L) >39 mg/dL Final         Failed - Triglycerides in normal range and within 360 days    Triglycerides  Date Value Ref Range Status  06/29/2019 178 (H) 0 - 149 mg/dL Final         Passed - Total Cholesterol in normal range and within 360 days    Cholesterol, Total  Date Value Ref Range Status  06/29/2019 102 100 - 199 mg/dL Final         Passed - LDL in normal range and within 360 days    LDL Chol Calc (NIH)  Date Value Ref Range Status  06/29/2019 44 0 - 99 mg/dL Final         Passed - Patient is not pregnant      Passed - Valid encounter within last 12 months    Recent Outpatient Visits          2 months ago Anxiety state   [facility] [place], Ranell Patrick, MD   4 months ago Anxiety state   [facility] [place], Ranell Patrick, MD   7 months ago Anxiety state   [facility] [place], Ranell Patrick, MD   8 months ago Increased prostate specific antigen (PSA) velocity   [facility] [place], Ranell Patrick, MD   9 months ago Annual physical exam   [facility] [place] [facility], Ranell Patrick, MD      Future Appointments            In 3 months Carlota Raspberry Ranell Patrick, MD [facility] at [place], [facility]

## 2019-09-18 DIAGNOSIS — M25522 Pain in left elbow: Secondary | ICD-10-CM | POA: Insufficient documentation

## 2019-10-13 ENCOUNTER — Ambulatory Visit: Payer: Self-pay | Attending: Internal Medicine

## 2019-10-13 DIAGNOSIS — Z23 Encounter for immunization: Secondary | ICD-10-CM

## 2019-10-13 NOTE — Progress Notes (Signed)
   Covid-19 Vaccination Clinic  Name:  BARAA MEADERS    MRN: [id number] DOB: [birth year]  10/13/2019  Mr. Mucha was observed post Covid-19 immunization for 15 minutes without incident. He was provided with Vaccine Information Sheet and instruction to access the V-Safe system.   Mr. Summit was instructed to call 911 with any severe reactions post vaccine: Marland Kitchen Difficulty breathing  . Swelling of face and throat  . A fast heartbeat  . A bad rash all over body  . Dizziness and weakness   Immunizations Administered    Name Date Dose VIS Date Route   Pfizer COVID-19 Vaccine 10/13/2019  2:51 PM 0.3 mL 06/29/2019 Intramuscular   Manufacturer: [place]   Lot: [id number]   [place]: [id number]

## 2019-11-06 ENCOUNTER — Ambulatory Visit: Payer: Self-pay | Attending: Internal Medicine

## 2019-11-06 DIAGNOSIS — Z23 Encounter for immunization: Secondary | ICD-10-CM

## 2019-11-06 NOTE — Progress Notes (Signed)
   Covid-19 Vaccination Clinic  Name:  Brandon Rich    MRN: [id number] DOB: 1960-09-09  11/06/2019  [name] was observed post Covid-19 immunization for 15 minutes without incident. He was provided with Vaccine Information Sheet and instruction to access the V-Safe system.   [name] was instructed to call 911 with any severe reactions post vaccine: Marland Kitchen Difficulty breathing  . Swelling of face and throat  . A fast heartbeat  . A bad rash all over body  . Dizziness and weakness   Immunizations Administered    Name Date Dose VIS Date Route   Pfizer COVID-19 Vaccine 11/06/2019  1:08 PM 0.3 mL 09/12/2018 Intramuscular   Manufacturer: [place]   Lot: [id number]   [place]: [id number]

## 2019-11-17 ENCOUNTER — Other Ambulatory Visit: Payer: Self-pay | Admitting: Family Medicine

## 2019-11-17 DIAGNOSIS — F411 Generalized anxiety disorder: Secondary | ICD-10-CM

## 2019-11-17 NOTE — Telephone Encounter (Signed)
Requested Prescriptions  Pending Prescriptions Disp Refills  . sertraline (ZOLOFT) 100 MG tablet [Pharmacy Med Name: SERTRALINE HCL 100 MG TABLET] 90 tablet 0    Sig: TAKE ONE TABLET BY MOUTH DAILY     Psychiatry:  Antidepressants - SSRI Passed - 11/17/2019 10:46 AM      Passed - Valid encounter within last 6 months    Recent Outpatient Visits          4 months ago Anxiety state   [facility] [place] Ranell Patrick, MD   6 months ago Anxiety state   [facility] [place], Ranell Patrick, MD   9 months ago Anxiety state   [facility] [place] [facility], Ranell Patrick, MD   10 months ago Increased prostate specific antigen (PSA) velocity   [facility] [place], Ranell Patrick, MD   11 months ago Annual physical exam   [facility] [place], Ranell Patrick, MD      Future Appointments            In 1 month Carlota Raspberry Ranell Patrick, MD Primary Care at [place], [facility]

## 2019-12-16 ENCOUNTER — Other Ambulatory Visit: Payer: Self-pay | Admitting: Family Medicine

## 2019-12-16 DIAGNOSIS — I10 Essential (primary) hypertension: Secondary | ICD-10-CM

## 2019-12-16 DIAGNOSIS — F4323 Adjustment disorder with mixed anxiety and depressed mood: Secondary | ICD-10-CM

## 2019-12-16 DIAGNOSIS — Z794 Long term (current) use of insulin: Secondary | ICD-10-CM

## 2019-12-16 DIAGNOSIS — F5104 Psychophysiologic insomnia: Secondary | ICD-10-CM

## 2019-12-16 NOTE — Telephone Encounter (Signed)
Requested medication (s) are due for refill today: yes  Requested medication (s) are on the active medication list: yes  Last refill:  06/29/19  Future visit scheduled: yes  Notes to clinic:  last Hgb A1C 12/02/2016   Requested Prescriptions  Pending Prescriptions Disp Refills   metFORMIN (GLUCOPHAGE) 1000 MG tablet [Pharmacy Med Name: metFORMIN HCL 1,000 MG TABLET] 180 tablet     Sig: TAKE ONE TABLET BY MOUTH TWICE A DAY WITH A MEAL      Endocrinology:  Diabetes - Biguanides Failed - 12/16/2019  3:06 PM      Failed - HBA1C is between 0 and 7.9 and within 180 days    Hgb A1c MFr Bld  Date Value Ref Range Status  12/02/2016 9.3 (H) 4.8 - 5.6 % Final    Comment:             Pre-diabetes: 5.7 - 6.4          Diabetes: >6.4          Glycemic control for adults with diabetes: <7.0           Passed - Cr in normal range and within 360 days    Creat  Date Value Ref Range Status  06/03/2016 0.88 0.70 - 1.33 mg/dL Final    Comment:      For patients > or = 60 years of age: The upper reference limit for Creatinine is approximately 13% higher for people identified as African-American.      Creatinine, Ser  Date Value Ref Range Status  06/29/2019 0.95 0.76 - 1.27 mg/dL Final          Passed - eGFR in normal range and within 360 days    GFR, Est African American  Date Value Ref Range Status  06/03/2016 >89 >=60 mL/min Final   GFR calc Af Amer  Date Value Ref Range Status  06/29/2019 101 >59 mL/min/1.73 Final   GFR, Est Non African American  Date Value Ref Range Status  06/03/2016 >89 >=60 mL/min Final   GFR calc non Af Amer  Date Value Ref Range Status  06/29/2019 87 >59 mL/min/1.73 Final          Passed - Valid encounter within last 6 months    Recent Outpatient Visits           5 months ago Anxiety state   [facility] [place], Ranell Patrick, MD   7 months ago Anxiety state   [facility] [place], Ranell Patrick, MD   10 months ago Anxiety state   [facility] [place], Ranell Patrick, MD   11 months ago Increased prostate specific antigen (PSA) velocity   [facility] [place], Ranell Patrick, MD   1 year ago Annual physical exam   [facility] at [place], MD       Future Appointments             In 1 week [name], MD Primary Care at [place], [facility]             Signed Prescriptions Disp Refills   hydrOXYzine (ATARAX/VISTARIL) 25 MG tablet 90 tablet 0    Sig: TAKE 1/2 -1 TABLETS BY MOUTH THREE TIMES A DAY AS NEEDED FOR ANXIETY      Ear, Nose, and Throat:  Antihistamines Passed - 12/16/2019  3:06 PM      Passed - Valid encounter within last 12 months    Recent  Outpatient Visits           5 months ago Anxiety state   [facility] [place], Ranell Patrick, MD   7 months ago Anxiety state   [facility] [place], Ranell Patrick, MD   10 months ago Anxiety state   [facility] at [place], MD   11 months ago Increased prostate specific antigen (PSA) velocity   [facility] [place] [facility], Ranell Patrick, MD   1 year ago Annual physical exam   [facility] [place], Ranell Patrick, MD       Future Appointments             In 1 week Carlota Raspberry Ranell Patrick, MD Primary Care at [place], [facility]

## 2019-12-16 NOTE — Telephone Encounter (Signed)
Requested Prescriptions  Pending Prescriptions Disp Refills  . hydrOXYzine (ATARAX/VISTARIL) 25 MG tablet [Pharmacy Med Name: hydrOXYzine HCL 25 MG TABLET] 90 tablet 0    Sig: TAKE 1/2 -1 TABLETS BY MOUTH THREE TIMES A DAY AS NEEDED FOR ANXIETY     Ear, Nose, and Throat:  Antihistamines Passed - 12/16/2019  3:06 PM      Passed - Valid encounter within last 12 months    Recent Outpatient Visits          5 months ago Anxiety state   [facility] [place], Ranell Patrick, MD   7 months ago Anxiety state   [facility] [place], Ranell Patrick, MD   10 months ago Anxiety state   [facility] [place], Ranell Patrick, MD   11 months ago Increased prostate specific antigen (PSA) velocity   [facility] [place], Ranell Patrick, MD   1 year ago Annual physical exam   [facility] [place], Ranell Patrick, MD      Future Appointments            In 1 week [name], MD Primary Care at [facility], [facility]           . metFORMIN (GLUCOPHAGE) 1000 MG tablet [Pharmacy Med Name: metFORMIN HCL 1,000 MG TABLET] 180 tablet     Sig: TAKE ONE TABLET BY MOUTH TWICE A DAY WITH A MEAL     Endocrinology:  Diabetes - Biguanides Failed - 12/16/2019  3:06 PM      Failed - HBA1C is between 0 and 7.9 and within 180 days    Hgb A1c MFr Bld  Date Value Ref Range Status  12/02/2016 9.3 (H) 4.8 - 5.6 % Final    Comment:             Pre-diabetes: 5.7 - 6.4          Diabetes: >6.4          Glycemic control for adults with diabetes: <7.0          Passed - Cr in normal range and within 360 days    Creat  Date Value Ref Range Status  06/03/2016 0.88 0.70 - 1.33 mg/dL Final    Comment:      For patients > or = 60 years of age: The upper reference limit for Creatinine is approximately 13% higher for people identified as African-American.      Creatinine, Ser  Date Value Ref Range Status  06/29/2019 0.95 0.76 - 1.27 mg/dL Final         Passed - eGFR in normal range and within 360  days    GFR, Est African American  Date Value Ref Range Status  06/03/2016 >89 >=60 mL/min Final   GFR calc Af Amer  Date Value Ref Range Status  06/29/2019 101 >59 mL/min/1.73 Final   GFR, Est Non African American  Date Value Ref Range Status  06/03/2016 >89 >=60 mL/min Final   GFR calc non Af Amer  Date Value Ref Range Status  06/29/2019 87 >59 mL/min/1.73 Final         Passed - Valid encounter within last 6 months    Recent Outpatient Visits          5 months ago Anxiety state   [facility] [place] Ranell Patrick, MD   7 months ago Anxiety state   [facility] [place], Ranell Patrick, MD   10 months  ago Anxiety state   [facility] at [place], MD   11 months ago Increased prostate specific antigen (PSA) velocity   [facility] [place], Ranell Patrick, MD   1 year ago Annual physical exam   [facility] [place], Ranell Patrick, MD      Future Appointments            In 1 week Carlota Raspberry Ranell Patrick, MD Primary Care at [place], [facility]

## 2019-12-17 ENCOUNTER — Ambulatory Visit (HOSPITAL_COMMUNITY)
Admission: EM | Admit: 2019-12-17 | Discharge: 2019-12-17 | Disposition: A | Discharge status: Home / Self Care | Payer: Managed Care, Other (non HMO) | Source: Home / Self Care

## 2019-12-17 ENCOUNTER — Emergency Department (HOSPITAL_COMMUNITY)
Admission: EM | Admit: 2019-12-17 | Discharge: 2019-12-17 | Disposition: A | Discharge status: Home / Self Care | Payer: Managed Care, Other (non HMO) | Attending: Emergency Medicine | Admitting: Emergency Medicine

## 2019-12-17 ENCOUNTER — Encounter (HOSPITAL_COMMUNITY): Payer: Self-pay | Admitting: Emergency Medicine

## 2019-12-17 ENCOUNTER — Emergency Department (HOSPITAL_COMMUNITY): Payer: Managed Care, Other (non HMO)

## 2019-12-17 ENCOUNTER — Other Ambulatory Visit: Payer: Self-pay

## 2019-12-17 DIAGNOSIS — E1165 Type 2 diabetes mellitus with hyperglycemia: Secondary | ICD-10-CM | POA: Insufficient documentation

## 2019-12-17 DIAGNOSIS — Z794 Long term (current) use of insulin: Secondary | ICD-10-CM | POA: Diagnosis not present

## 2019-12-17 DIAGNOSIS — I509 Heart failure, unspecified: Secondary | ICD-10-CM

## 2019-12-17 DIAGNOSIS — R0602 Shortness of breath: Secondary | ICD-10-CM | POA: Insufficient documentation

## 2019-12-17 DIAGNOSIS — I498 Other specified cardiac arrhythmias: Secondary | ICD-10-CM | POA: Insufficient documentation

## 2019-12-17 DIAGNOSIS — R42 Dizziness and giddiness: Secondary | ICD-10-CM | POA: Diagnosis present

## 2019-12-17 DIAGNOSIS — Z79899 Other long term (current) drug therapy: Secondary | ICD-10-CM | POA: Insufficient documentation

## 2019-12-17 DIAGNOSIS — I10 Essential (primary) hypertension: Secondary | ICD-10-CM | POA: Diagnosis not present

## 2019-12-17 DIAGNOSIS — I471 Supraventricular tachycardia: Secondary | ICD-10-CM | POA: Diagnosis not present

## 2019-12-17 LAB — TROPONIN I (HIGH SENSITIVITY)
Troponin I (High Sensitivity): 9 ng/L (ref <18)
Troponin I (High Sensitivity): 8 ng/L (ref <18)

## 2019-12-17 LAB — BASIC METABOLIC PANEL
Anion gap: 14 (ref 5–15)
BUN: 14 mg/dL (ref 6–20)
CO2: 16 mmol/L — ABNORMAL LOW (ref 22–32)
Calcium: 9.7 mg/dL (ref 8.9–10.3)
Chloride: 107 mmol/L (ref 98–111)
Creatinine, Ser: 1.06 mg/dL (ref 0.61–1.24)
GFR calc Af Amer: >60 mL/min (ref >60)
GFR calc non Af Amer: >60 mL/min (ref >60)
Glucose, Bld: 233 mg/dL — ABNORMAL HIGH (ref 70–99)
Potassium: 4.1 mmol/L (ref 3.5–5.1)
Sodium: 137 mmol/L (ref 135–145)

## 2019-12-17 LAB — CBC
HCT: 53.6 % — ABNORMAL HIGH (ref 39.0–52.0)
Hemoglobin: 17.5 g/dL — ABNORMAL HIGH (ref 13.0–17.0)
MCH: 27.6 pg (ref 26.0–34.0)
MCHC: 32.6 g/dL (ref 30.0–36.0)
MCV: 84.5 fL (ref 80.0–100.0)
Platelets: 310 10*3/uL (ref 150–400)
RBC: 6.34 MIL/uL — ABNORMAL HIGH (ref 4.22–5.81)
RDW: 14.7 % (ref 11.5–15.5)
WBC: 12.2 10*3/uL — ABNORMAL HIGH (ref 4.0–10.5)
nRBC: 0 % (ref 0.0–0.2)

## 2019-12-17 LAB — D-DIMER, QUANTITATIVE: D-Dimer, Quant: 1.45 ug/mL-FEU — ABNORMAL HIGH (ref 0.00–0.50)

## 2019-12-17 LAB — BRAIN NATRIURETIC PEPTIDE: B Natriuretic Peptide: 300.2 pg/mL — ABNORMAL HIGH (ref 0.0–100.0)

## 2019-12-17 MED ORDER — IOHEXOL 350 MG/ML SOLN
80.0000 mL | Freq: Once | INTRAVENOUS | Status: AC | PRN
  Administered 2019-12-17: 80 mL via INTRAVENOUS

## 2019-12-17 MED ORDER — SODIUM CHLORIDE 0.9% FLUSH
3.0000 mL | Freq: Once | INTRAVENOUS | Status: AC
  Administered 2019-12-17: 3 mL via INTRAVENOUS

## 2019-12-17 MED ORDER — SODIUM CHLORIDE 0.9 % IV BOLUS
1000.0000 mL | Freq: Once | INTRAVENOUS | Status: AC
  Administered 2019-12-17: 1000 mL via INTRAVENOUS

## 2019-12-17 NOTE — Discharge Instructions (Signed)
Drink plenty of fluids and get plenty of rest.  Continue to take your home medications as prescribed.  Your work-up today showed signs of dehydration but your symptoms improved with IV fluids.  Repeat EKG was reassuring as well.  No signs of blood clot or heart attack today.  Please call your cardiologist tomorrow to schedule follow-up for reevaluation of your symptoms.  He may want to do Holter monitoring.  Return to the emergency department if any concerning signs or symptoms develop such as persistently elevated heart rates greater than 120, fevers, chest pains, shortness of breath, lightheadedness, loss of consciousness.

## 2019-12-17 NOTE — ED Triage Notes (Addendum)
Patient here with complaint of dizziness and shortness of breath. States he use his pulse oximeter this morning and it read his pulse to be 140 bpm and 98% SpO2. Denies any pain. Patient alert, oriented ,and in no apparent distress. Heart rate in 170's according to EKG.

## 2019-12-17 NOTE — ED Triage Notes (Signed)
Patient woke around 4 am this morning.  Reports he just didn't feel right, non-specific.  Did go back to sleep, woke 2 hours later.  Checked pulse ox, noticed heart rate 140.  Checked blood pressure , 100/80.  P.O. 98%.  Patient denies chest pain, feels clammy, slight sob, slight nausea, slight dizziness "just not feeling right"

## 2019-12-17 NOTE — Discharge Instructions (Signed)
Please drive your husband to the emergency room now for management of supraventricular tachycardia, heart rate ranging between 134-140.

## 2019-12-17 NOTE — ED Notes (Signed)
Lab to add D-dimer & BNP

## 2019-12-17 NOTE — ED Provider Notes (Signed)
[facility] EMERGENCY DEPARTMENT Provider Note   CSN: [id number] Arrival date & time: 12/17/19  1225     History Chief Complaint  Patient presents with  . Dizziness    Brandon Rich is a 59 y.o. male with history of diabetes mellitus, hyperlipidemia, hypertension, low ventricular hypertrophy, OSA presents for evaluation of acute onset, persistent generalized weakness and intermittent shortness of breath.  Reports that symptoms awoke him from his sleep at around 3 AM where he notes feeling "not good".  He states that he was able to fall back asleep and then awoke to find that he felt very similarly so he checked his vital signs using his pulse oximeter and noted that he was tachycardic with a heart rate as high as 150 bpm.  He then went to urgent care for further evaluation where he had an EKG that was concerning for possible SVT so he was sent here for further evaluation and management.  He reports dyspnea on exertion, denies chest pain, nausea, vomiting, syncope.  He takes a baby aspirin daily but otherwise does not take any blood thinners.  Denies recent travel or surgeries, hemoptysis, prior history of DVT or PE.  The history is provided by the patient.       Past Medical History:  Diagnosis Date  . Acute combined systolic and diastolic heart failure ([place]) 02/16/2016  . Allergy   . Diabetes mellitus   . Diabetic foot ulcer ([place])   . FUO (fever of unknown origin) 02/16/2016  . Glaucoma   . Hyperlipidemia   . Hypertension   . LVH (left ventricular hypertrophy) 03/29/2016  . Rash and nonspecific skin eruption 02/16/2016  . Sleep apnea    CPAP  . Transaminitis 02/16/2016    Patient Active Problem List   Diagnosis Date Noted  . Chest discomfort 02/02/2019  . Morbid obesity ([place]) 02/02/2019  . Pain in right hand 02/15/2018  . Trigger finger 02/15/2018  . Cortical age-related cataract of both eyes 03/30/2017  . Nuclear sclerotic cataract of both eyes  03/30/2017  . Primary open angle glaucoma (POAG) of both eyes, indeterminate stage 03/30/2017  . Type 2 diabetes mellitus without complication, without long-term current use of insulin ([place]) 03/30/2017  . LVH (left ventricular hypertrophy) 03/29/2016  . FUO (fever of unknown origin) 02/16/2016  . Acute combined systolic and diastolic heart failure ([place]) 02/16/2016  . Weight gain 02/16/2016  . Weight loss 02/16/2016  . Rash and nonspecific skin eruption 02/16/2016  . DOE (dyspnea on exertion) 02/16/2016  . T wave inversion in EKG 02/16/2016  . Transaminitis 02/16/2016  . Arthritis of big toe 08/23/2013  . Pain of left great toe 08/23/2013  . HTN (hypertension) 12/20/2011  . Diabetes mellitus ([place]) 12/20/2011  . Dyslipidemia 12/20/2011  . Glaucoma (increased eye pressure) 07/19/1996    Past Surgical History:  Procedure Laterality Date  . HERNIA REPAIR    . SPINE SURGERY    . TUMOR REMOVAL     Bone Cyst       Family History  Problem Relation Age of Onset  . Skin cancer Mother   . Cancer Mother   . Heart disease Mother        Atrial Fib  . Hypertension Mother   . Cancer Father   . Heart disease Father        Valve Disease  . Heart disease Maternal Grandmother   . Hyperlipidemia Maternal Grandmother   . Hypertension Maternal Grandmother   . Heart disease  Maternal Grandfather   . Heart disease Paternal Grandfather   . Hypertension Paternal Grandfather   . Hyperlipidemia Paternal Grandfather     Social History   Tobacco Use  . Smoking status: Never Smoker  . Smokeless tobacco: Never Used  Substance Use Topics  . Alcohol use: No    Alcohol/week: 0.0 standard drinks    Comment: 1 drink  . Drug use: No    Home Medications Prior to Admission medications   Medication Sig Start Date End Date Taking? Authorizing Provider  Ascorbic Acid (VITAMIN C) 1000 MG tablet Take 1,000 mg by mouth daily.    [provider]  aspirin 81 MG tablet Take 81 mg by mouth  daily.    [provider]  atorvastatin (LIPITOR) 20 MG tablet TAKE ONE TABLET BY MOUTH DAILY 09/15/19   [name], MD  BD PEN NEEDLE NANO U/F 32G X 4 MM MISC  07/04/18   [provider]  brimonidine (ALPHAGAN) 0.2 % ophthalmic solution Place 1 drop into both eyes 2 (two) times daily.    [provider]  calcium carbonate (TUMS - DOSED IN MG ELEMENTAL CALCIUM) 500 MG chewable tablet Chew 2 tablets by mouth daily as needed for indigestion or heartburn.    [provider]  Cholecalciferol (VITAMIN D-1000 MAX ST) 1000 units tablet Take by mouth.    [provider]  empagliflozin (JARDIANCE) 25 MG TABS tablet Take 25 mg by mouth daily. 12/16/16   [name], MD  fexofenadine (ALLEGRA) 180 MG tablet Take 180 mg by mouth daily.    [provider]  fluticasone (FLONASE) 50 MCG/ACT nasal spray PLACE 2 SPRAYS INTO BOTH NOSTRILS DAILY. 07/09/14   Orma Flaming, MD  hydrOXYzine (ATARAX/VISTARIL) 25 MG tablet TAKE 1/2 -1 TABLETS BY MOUTH THREE TIMES A DAY AS NEEDED FOR ANXIETY 12/16/19   [name], MD  Insulin Glargine [facility] KWIKPEN) 100 UNIT/ML SOPN Inject into the skin. 03/07/17   [provider]  lisinopril (ZESTRIL) 40 MG tablet Take 1 tablet (40 mg total) by mouth daily. 02/02/19   Minus Breeding, MD  metFORMIN (GLUCOPHAGE) 1000 MG tablet TAKE ONE TABLET BY MOUTH TWICE A DAY WITH A MEAL 12/18/19   [name], MD  Multiple Vitamin (MULTIVITAMIN) tablet Take 1 tablet by mouth every evening.     [provider]  Semaglutide,0.25 or 0.5MG /DOS, (OZEMPIC, 0.25 OR 0.5 MG/DOSE,) 2 MG/1.5ML SOPN Inject into the skin.    [provider]  sertraline (ZOLOFT) 100 MG tablet TAKE ONE TABLET BY MOUTH DAILY 11/17/19   [name], MD  silver sulfADIAZINE (SILVADENE) 1 % cream Apply pea-sized amount to wound daily. 07/04/18   Evelina Bucy, DPM    Allergies    Other  Review of Systems   Review of  Systems  Constitutional: Negative for chills and fever.  Respiratory: Positive for shortness of breath.   Cardiovascular: Negative for palpitations and leg swelling.  Gastrointestinal: Negative for abdominal pain, nausea and vomiting.  All other systems reviewed and are negative.   Physical Exam Updated Vital Signs BP 123/67   Pulse 93   Temp 98.2 F (36.8 C) (Oral)   Resp 16   Ht 5\' 9"  (1.753 m)   Wt 131.5 kg   SpO2 97%   BMI 42.83 kg/m   Physical Exam Vitals and nursing note reviewed.  Constitutional:      General: He is not in acute distress.    Appearance: He is  well-developed.  HENT:     Head: Normocephalic and atraumatic.  Eyes:     General:        Right eye: No discharge.        Left eye: No discharge.     Conjunctiva/sclera: Conjunctivae normal.  Neck:     Vascular: No JVD.     Trachea: No tracheal deviation.  Cardiovascular:     Rate and Rhythm: Regular rhythm. Tachycardia present.     Comments: 2+ radial and DP/PT pulses bilaterally, Homans sign absent bilaterally, no lower extremity edema, no palpable cords, compartments are soft  Pulmonary:     Effort: Pulmonary effort is normal.     Breath sounds: Normal breath sounds.  Chest:     Chest wall: No tenderness.  Abdominal:     General: Bowel sounds are normal. There is no distension.     Palpations: Abdomen is soft.     Tenderness: There is no abdominal tenderness. There is no right CVA tenderness, left CVA tenderness, guarding or rebound.  Skin:    General: Skin is warm and dry.     Findings: No erythema.  Neurological:     Mental Status: He is alert.  Psychiatric:        Behavior: Behavior normal.     ED Results / Procedures / Treatments   Labs (all labs ordered are listed, but only abnormal results are displayed) Labs Reviewed  BASIC METABOLIC PANEL - Abnormal; Notable for the following components:      Result Value   CO2 16 (*)    Glucose, Bld 233 (*)    All other components within  normal limits  CBC - Abnormal; Notable for the following components:   WBC 12.2 (*)    RBC 6.34 (*)    Hemoglobin 17.5 (*)    HCT 53.6 (*)    All other components within normal limits  BRAIN NATRIURETIC PEPTIDE - Abnormal; Notable for the following components:   B Natriuretic Peptide 300.2 (*)    All other components within normal limits  D-DIMER, QUANTITATIVE (NOT AT [facility]) - Abnormal; Notable for the following components:   D-Dimer, Quant 1.45 (*)    All other components within normal limits  CBG MONITORING, ED  TROPONIN I (HIGH SENSITIVITY)  TROPONIN I (HIGH SENSITIVITY)    EKG EKG Interpretation  Date/Time:  Monday Dec 17 2019 20:32:19 EDT Ventricular Rate:  88 PR Interval:    QRS Duration: 90 QT Interval:  340 QTC Calculation: 412 R Axis:   1 Text Interpretation: Sinus rhythm Prolonged PR interval Anterior infarct, old tachycardia resolved Confirmed by Sherwood Gambler [phone]) on 12/17/2019 11:28:36 PM   Radiology CT Angio Chest PE W and/or Wo Contrast  Result Date: 12/17/2019 CLINICAL DATA:  Shortness of breath and dizziness EXAM: CT ANGIOGRAPHY CHEST WITH CONTRAST TECHNIQUE: Multidetector CT imaging of the chest was performed using the standard protocol during bolus administration of intravenous contrast. Multiplanar CT image reconstructions and MIPs were obtained to evaluate the vascular anatomy. CONTRAST:  35mL OMNIPAQUE IOHEXOL 350 MG/ML SOLN COMPARISON:  Chest x-ray from earlier in the same day. FINDINGS: Cardiovascular: Thoracic aorta and its branches demonstrate atherosclerotic calcification. No aneurysmal dilatation is noted. No significant opacification to allow for dissection evaluation is noted. The heart is not significantly enlarged in size. The pulmonary artery is well visualized with a normal branching pattern. No filling defect to suggest pulmonary embolism is noted. Mild coronary calcifications are noted. Mediastinum/Nodes: Thoracic inlet is within normal limits.  Mild  mediastinal lipomatosis is noted. No hilar or mediastinal adenopathy is noted. The esophagus is within normal limits. Lungs/Pleura: Lungs are well aerated bilaterally. Mild changes of air trapping are seen. No focal infiltrate or sizable effusion is noted. Upper Abdomen: Small gallstones are noted without complicating factors. Mild fatty infiltration of the liver is noted. Musculoskeletal: Degenerative changes of the thoracic spine are noted. Review of the MIP images confirms the above findings. IMPRESSION: No evidence of pulmonary emboli. Small gallstones without complicating factors. Fatty liver. Aortic Atherosclerosis (ICD10-I70.0) and Emphysema (ICD10-J43.9). Electronically Signed   By: Inez Catalina M.D.   On: 12/17/2019 20:26   DG Chest Portable 1 View  Result Date: 12/17/2019 CLINICAL DATA:  Acute shortness of breath. EXAM: PORTABLE CHEST 1 VIEW COMPARISON:  01/21/2016 and prior radiographs FINDINGS: The cardiomediastinal silhouette is unchanged. There is no evidence of focal airspace disease, pulmonary edema, suspicious pulmonary nodule/mass, pleural effusion, or pneumothorax. No acute bony abnormalities are identified. IMPRESSION: No active disease. Electronically Signed   By: Margarette Canada M.D.   On: 12/17/2019 18:39    Procedures Procedures (including critical care time)  Medications Ordered in ED Medications  sodium chloride flush (NS) 0.9 % injection 3 mL (3 mLs Intravenous Given 12/17/19 1902)  sodium chloride 0.9 % bolus 1,000 mL (0 mLs Intravenous Stopped 12/17/19 2032)  iohexol (OMNIPAQUE) 350 MG/ML injection 80 mL (80 mLs Intravenous Contrast Given 12/17/19 2011)    ED Course  I have reviewed the triage vital signs and the nursing notes.  Pertinent labs & imaging results that were available during my care of the patient were reviewed by me and considered in my medical decision making (see chart for details).    MDM Rules/Calculators/A&P                      Patient presents  sent from urgent care for evaluation of tachycardia with associated nausea lightheadedness and dyspnea on exertion.  No chest pains.  He is afebrile in the ED.  He is noted to be tachycardic with a rate of 120 to 130 bpm on the monitor.  EKG shows atrial tachycardia though P waves are difficult to visualize.  His rate is very regular so it seems to suggest that he is not in A. fib.  The rate is too slow to suggest SVT.  Rhythm does not appear to be in a flutter.   His lab work which was reviewed and interpreted by myself shows elevated leukocytosis but also elevated hemoglobin and hematocrit.  In a non-smoker with no history of polycythemia vera I am concerned that he is dehydrated and his blood is hemoconcentrated.  We will give IV fluids and reassess.  Remainder of lab work shows no evidence of renal insufficiency or metabolic derangements.  His CO2 is a little low but appears chronically low per review of prior labs.  He is hyperglycemic but not in DKA.  His BNP is mildly elevated but chest x-ray does not show any evidence of volume overload and he does not have any evidence of peripheral edema.  A D-dimer was obtained in the setting of tachycardia and dyspnea on exertion in an individual who is not PERC negative and this was elevated so we proceeded with a CTA of the chest to rule out PE.  CTA shows no evidence of PE, no pleural effusion, pneumonia, or other concerning abnormalities to suggest CHF or infection.  He has heart failure listed as one of his medical problems but  he underwent an echo in 2020 which showed an LVEF of 55 to 60%.  Serial troponins are negative and patient remains chest pain-free in the ED.  Doubt ACS/MI at this time.  No evidence of dissection, cardiac tamponade, esophageal rupture, or pneumothorax on work-up today.  Patient had marked improvement in his heart rate with IV fluids.  Repeat EKG shows normal sinus rhythm with no acute ischemic abnormalities.  At this time he  reports he is asymptomatic and feels much better and feels comfortable with discharge home.  I think this is reasonable.  He has been seen by Dr. Percival Spanish previously with cardiology.  He will call to schedule close cardiology follow-up.  They may want to do Holter monitoring if he continues to have arrhythmias.  He will monitor his heart rate at home with his pulse oximeter.  Discussed strict ED return precautions. Patient verbalized understanding of and agreement with plan and is safe for discharge home at this time.  Patient was seen and evaluated by Dr. Regenia Skeeter who agrees with assessment and plan at this time.  Final Clinical Impression(s) / ED Diagnoses Final diagnoses:  Arrhythmia, atrial    Rx / DC Orders ED Discharge Orders    None       Renita Papa, PA-C 12/18/19 1509    Sherwood Gambler, MD 12/19/19 [phone]

## 2019-12-17 NOTE — ED Provider Notes (Signed)
Brandon Rich   MRN: [id number] DOB: Dec 05, 1960  Subjective:   Brandon Rich is a 59 y.o. male with past medical history of heart failure, LVH, hypertension presenting for sudden onset this morning of mild malaise, occasional shortness of breath.  Patient checked his pulse and was in the 140s, states that he has never seen at this high.  Patient's mother has a history of atrial fibrillation.  Denies personal history of the same.  Currently, patient states that he just feels a little "off" but denies confusion, headache, vision change, chest pain, shortness of breath, belly pain, diaphoresis.  No current facility-administered medications for this encounter.  Current Outpatient Medications:  .  Ascorbic Acid (VITAMIN C) 1000 MG tablet, Take 1,000 mg by mouth daily., Disp: , Rfl:  .  aspirin 81 MG tablet, Take 81 mg by mouth daily., Disp: , Rfl:  .  atorvastatin (LIPITOR) 20 MG tablet, TAKE ONE TABLET BY MOUTH DAILY, Disp: 90 tablet, Rfl: 1 .  BD PEN NEEDLE NANO U/F 32G X 4 MM MISC, , Disp: , Rfl:  .  brimonidine (ALPHAGAN) 0.2 % ophthalmic solution, Place 1 drop into both eyes 2 (two) times daily., Disp: , Rfl:  .  calcium carbonate (TUMS - DOSED IN MG ELEMENTAL CALCIUM) 500 MG chewable tablet, Chew 2 tablets by mouth daily as needed for indigestion or heartburn., Disp: , Rfl:  .  Cholecalciferol (VITAMIN D-1000 MAX ST) 1000 units tablet, Take by mouth., Disp: , Rfl:  .  empagliflozin (JARDIANCE) 25 MG TABS tablet, Take 25 mg by mouth daily., Disp: 30 tablet, Rfl: 3 .  fexofenadine (ALLEGRA) 180 MG tablet, Take 180 mg by mouth daily., Disp: , Rfl:  .  fluticasone (FLONASE) 50 MCG/ACT nasal spray, PLACE 2 SPRAYS INTO BOTH NOSTRILS DAILY., Disp: 16 g, Rfl: 10 .  hydrOXYzine (ATARAX/VISTARIL) 25 MG tablet, TAKE 1/2 -1 TABLETS BY MOUTH THREE TIMES A DAY AS NEEDED FOR ANXIETY, Disp: 90 tablet, Rfl: 0 .  Insulin Glargine (BASAGLAR KWIKPEN) 100 UNIT/ML SOPN, Inject into the skin., Disp: ,  Rfl:  .  lisinopril (ZESTRIL) 40 MG tablet, Take 1 tablet (40 mg total) by mouth daily., Disp: 90 tablet, Rfl: 3 .  metFORMIN (GLUCOPHAGE) 1000 MG tablet, Take 1 tablet (1,000 mg total) by mouth 2 (two) times daily with a meal., Disp: 180 tablet, Rfl: 1 .  Multiple Vitamin (MULTIVITAMIN) tablet, Take 1 tablet by mouth every evening. , Disp: , Rfl:  .  Semaglutide,0.25 or 0.5MG /DOS, (OZEMPIC, 0.25 OR 0.5 MG/DOSE,) 2 MG/1.5ML SOPN, Inject into the skin., Disp: , Rfl:  .  sertraline (ZOLOFT) 100 MG tablet, TAKE ONE TABLET BY MOUTH DAILY, Disp: 90 tablet, Rfl: 0 .  silver sulfADIAZINE (SILVADENE) 1 % cream, Apply pea-sized amount to wound daily., Disp: 50 g, Rfl: 0   Allergies  Allergen Reactions  . Other Other (See Comments)    Hayfever    Past Medical History:  Diagnosis Date  . Acute combined systolic and diastolic heart failure ([place]) 02/16/2016  . Allergy   . Diabetes mellitus   . Diabetic foot ulcer ([place])   . FUO (fever of unknown origin) 02/16/2016  . Glaucoma   . Hyperlipidemia   . Hypertension   . LVH (left ventricular hypertrophy) 03/29/2016  . Rash and nonspecific skin eruption 02/16/2016  . Sleep apnea    CPAP  . Transaminitis 02/16/2016     Past Surgical History:  Procedure Laterality Date  . HERNIA REPAIR    . SPINE SURGERY    .  TUMOR REMOVAL     Bone Cyst    Family History  Problem Relation Age of Onset  . Skin cancer Mother   . Cancer Mother   . Heart disease Mother        Atrial Fib  . Hypertension Mother   . Cancer Father   . Heart disease Father        Valve Disease  . Heart disease Maternal Grandmother   . Hyperlipidemia Maternal Grandmother   . Hypertension Maternal Grandmother   . Heart disease Maternal Grandfather   . Heart disease Paternal Grandfather   . Hypertension Paternal Grandfather   . Hyperlipidemia Paternal Grandfather     Social History   Tobacco Use  . Smoking status: Never Smoker  . Smokeless tobacco: Never Used  Substance Use  Topics  . Alcohol use: No    Alcohol/week: 0.0 standard drinks    Comment: 1 drink  . Drug use: No    ROS   Objective:   Vitals: BP 122/85 (BP Location: Right Arm) Comment (BP Location): large cuff  Pulse (!) 134   Temp 98.3 F (36.8 C) (Oral)   Resp (!) 24   SpO2 98%   Physical Exam Constitutional:      General: He is not in acute distress.    Appearance: Normal appearance. He is well-developed. He is obese. He is not ill-appearing, toxic-appearing or diaphoretic.  HENT:     Head: Normocephalic and atraumatic.     Right Ear: External ear normal.     Left Ear: External ear normal.     Nose: Nose normal.     Mouth/Throat:     Mouth: Mucous membranes are moist.     Pharynx: Oropharynx is clear.  Eyes:     General: No scleral icterus.       Right eye: No discharge.        Left eye: No discharge.     Extraocular Movements: Extraocular movements intact.     Pupils: Pupils are equal, round, and reactive to light.  Cardiovascular:     Rate and Rhythm: Regular rhythm. Tachycardia present.     Heart sounds: Normal heart sounds. No murmur. No friction rub. No gallop.   Pulmonary:     Effort: Pulmonary effort is normal. No respiratory distress.     Breath sounds: Normal breath sounds. No stridor. No wheezing, rhonchi or rales.  Musculoskeletal:     Cervical back: Normal range of motion.     Right lower leg: No edema.     Left lower leg: No edema.  Neurological:     Mental Status: He is alert and oriented to person, place, and time.     Cranial Nerves: No cranial nerve deficit.     Motor: No weakness.  Psychiatric:        Mood and Affect: Mood normal.        Behavior: Behavior normal.        Thought Content: Thought content normal.        Judgment: Judgment normal.     ED ECG REPORT   Date: 12/17/2019  Rate: 134bpm  Rhythm: supraventricular tachycardia (SVT)  QRS Axis: normal  Intervals: normal  ST/T Wave abnormalities: nonspecific T wave changes  Conduction  Disutrbances:none  Narrative Interpretation: New onset SVT at 134 bpm, T wave inversion in lead aVL starkly different from previous EKG on 12/01/2018.  Old EKG Reviewed: changes noted  I have personally reviewed the EKG tracing and agree with the computerized  printout as noted.   Assessment and Plan :   PDMP not reviewed this encounter.  1. SVT (supraventricular tachycardia) ([place])   2. Congestive heart failure, unspecified HF chronicity, unspecified heart failure type [facility])     Discussed risks of uncontrolled SVT, offered transport to [facility] emergency room by EMS but patient refused.  He insisted his wife transport him there and given lack of chest pain, shortness of breath, diaphoresis, belly pain, neurologic changes on exam obliged patient.  Discussed this with his wife, she contracts for safety and will transported by personal vehicle to the emergency room now.  No interventions were given to the patient prior to his discharge.   Jaynee Eagles, PA-C 12/17/19 1228

## 2019-12-28 ENCOUNTER — Ambulatory Visit (INDEPENDENT_AMBULATORY_CARE_PROVIDER_SITE_OTHER): Payer: Managed Care, Other (non HMO) | Admitting: Family Medicine

## 2019-12-28 ENCOUNTER — Encounter: Payer: Self-pay | Admitting: Family Medicine

## 2019-12-28 ENCOUNTER — Other Ambulatory Visit: Payer: Self-pay

## 2019-12-28 VITALS — BP 131/82 | HR 80 | Temp 98.7°F | Ht 69.0 in | Wt 306.0 lb

## 2019-12-28 DIAGNOSIS — Z0001 Encounter for general adult medical examination with abnormal findings: Secondary | ICD-10-CM | POA: Diagnosis not present

## 2019-12-28 DIAGNOSIS — I10 Essential (primary) hypertension: Secondary | ICD-10-CM | POA: Diagnosis not present

## 2019-12-28 DIAGNOSIS — F5104 Psychophysiologic insomnia: Secondary | ICD-10-CM | POA: Diagnosis not present

## 2019-12-28 DIAGNOSIS — Z0000 Encounter for general adult medical examination without abnormal findings: Secondary | ICD-10-CM

## 2019-12-28 DIAGNOSIS — F4323 Adjustment disorder with mixed anxiety and depressed mood: Secondary | ICD-10-CM

## 2019-12-28 DIAGNOSIS — E11621 Type 2 diabetes mellitus with foot ulcer: Secondary | ICD-10-CM | POA: Diagnosis not present

## 2019-12-28 DIAGNOSIS — Z8042 Family history of malignant neoplasm of prostate: Secondary | ICD-10-CM

## 2019-12-28 DIAGNOSIS — R002 Palpitations: Secondary | ICD-10-CM

## 2019-12-28 DIAGNOSIS — Z794 Long term (current) use of insulin: Secondary | ICD-10-CM

## 2019-12-28 DIAGNOSIS — E785 Hyperlipidemia, unspecified: Secondary | ICD-10-CM | POA: Diagnosis not present

## 2019-12-28 DIAGNOSIS — D72829 Elevated white blood cell count, unspecified: Secondary | ICD-10-CM

## 2019-12-28 DIAGNOSIS — F411 Generalized anxiety disorder: Secondary | ICD-10-CM

## 2019-12-28 DIAGNOSIS — R000 Tachycardia, unspecified: Secondary | ICD-10-CM

## 2019-12-28 DIAGNOSIS — Z125 Encounter for screening for malignant neoplasm of prostate: Secondary | ICD-10-CM

## 2019-12-28 DIAGNOSIS — L97509 Non-pressure chronic ulcer of other part of unspecified foot with unspecified severity: Secondary | ICD-10-CM

## 2019-12-28 MED ORDER — LISINOPRIL 40 MG PO TABS: 40.0000 mg | ORAL_TABLET | Freq: Every day | ORAL | 3 refills | Status: DC

## 2019-12-28 MED ORDER — HYDROXYZINE HCL 25 MG PO TABS: 12.5000 mg | ORAL_TABLET | Freq: Every evening | ORAL | 1 refills | Status: DC | PRN

## 2019-12-28 MED ORDER — SERTRALINE HCL 100 MG PO TABS: 100.0000 mg | ORAL_TABLET | Freq: Every day | ORAL | 1 refills | Status: DC

## 2019-12-28 MED ORDER — ATORVASTATIN CALCIUM 20 MG PO TABS: 20.0000 mg | ORAL_TABLET | Freq: Every day | ORAL | 1 refills | Status: DC

## 2019-12-28 NOTE — Progress Notes (Signed)
Subjective:  Patient ID: Brandon Rich, male    DOB: 02-01-1961  Age: 59 y.o. MRN: [id number]  CC:  Chief Complaint  Patient presents with  . Annual Exam    pt reports as far as his general health he feels good. pt states he went to the ER resently for elevated heart rate that ended up being dehydration. pt was given an IV and sent home pt hasn't had any issues since.  . Diabetes    pt states that he has a diabetic ulser on his R great toe. pt is seeing wond care for it and it dosn't inpeed ing daily life. pt want to inform provider about the ulser.    HPI Brandon Rich presents for  Annual exam, med review.  Care team Endocrinology, Dr. Buddy Duty Orthopedics Drs. Molli Posey Urology, Dr. Alinda Money Ophthalmology: Dr. Ander Slade   Diabetes: Complicated by diabetic ulcer of right great toe.  Followed by orthopedics, wound care.  Followed by endocrinology, Dr.Kerr. Treated with Basaglar, Metformin, Ozempic, Jardiance.  He is on ACE inhibitor and statin. A1c 7.25 May 2019 endocrinology, basaglar increased from 70-80 units.  Microalbumin: 08/08/2015, due for repeat Optho, foot exam, pneumovax: Up-to-date Saw Dr. Buddy Duty about a month ago. A1c 7.9.  Now back at 80 units basaglar.   Lab Results  Component Value Date   HGBA1C 9.3 (H) 12/02/2016   HGBA1C 8.5 (H) 06/03/2016   HGBA1C 8.9 (H) 01/30/2016   Lab Results  Component Value Date   MICROALBUR 1.3 06/03/2016   LDLCALC 51 12/28/2019   CREATININE 0.90 12/28/2019   Heart palpitations: HR 140 few weeks ago - urgent care then ER, concern for SVT.  Slight elevated hemoglobin, hematocrit, leukocytosis.  IV fluids were given.  BNP mildly elevated but no evidence of volume overload on chest x-ray.  D-dimer was obtained that was elevated, CTA of the chest without signs of PE.  No pleural effusion pneumonia or other concerning abnormalities.  Serial troponins were negative.  Unlikely ACS/MI.  Heart rate improved with IV fluids, sinus rhythm  on repeat EKG without acute ischemic abnormalities.  Follow-up recommended with Dr. Percival Spanish to decide on Holter monitoring to rule out underlying arrhythmia. No palpitations or elevated HR since ER visit.  No fever.  Feels ok now.  Lab Results  Component Value Date   WBC 8.8 12/28/2019   HGB 15.0 12/28/2019   HCT 45.0 12/28/2019   MCV 82 12/28/2019   PLT 252 12/28/2019   Hyperlipidemia: Lipitor 20 mg daily. No new side effects.  Lab Results  Component Value Date   CHOL 106 12/28/2019   HDL 31 (L) 12/28/2019   LDLCALC 51 12/28/2019   TRIG 132 12/28/2019   CHOLHDL 3.4 12/28/2019   Lab Results  Component Value Date   ALT 27 12/28/2019   AST 50 (H) 12/28/2019   ALKPHOS 71 12/28/2019   BILITOT 0.3 12/28/2019   Hypertension: On lisinoril 40mg  qd.  Home readings: BP Readings from Last 3 Encounters:  12/28/19 131/82  12/17/19 123/67  12/17/19 122/85   Lab Results  Component Value Date   CREATININE 0.90 12/28/2019    Elevated PSA Refer to urology last year, appointment September 4 with Dr. Alinda Money.  Family history of prostate cancer in his father.  PSA was repeated, also discussed erectile dysfunction treatment. PSA repeated, back to normal. Unknown number - plan to continue monitoring PSA, no follow up. PSA October - unknown reading.   History of chest discomfort,   referred to  cardiology last year, Dr. Percival Spanish Negative myocardial perfusion study August 6.  No further work-up of ischemia planned.  Anxiety Treated with Zoloft, increased to 100 mg last October.  Hydroxyzine as needed for sleep - nightly recently working well. meeting with therapist 2x/month.  Doing well, meds working ok.   Cancer screening: Negative Cologuard 12/14/2018 PSA 1.3 on 09/20/2017, 2.2 in May 2020, evaluated with urology as above.  Immunization History  Administered Date(s) Administered  . Influenza Split 04/04/2012, 04/14/2013, 03/25/2019  . Influenza Whole 09/05/2017  .  Influenza,inj,Quad PF,6+ Mos 04/14/2013, 04/24/2014, 06/16/2015, 03/27/2016, 04/16/2017, 04/10/2018  . Influenza-Unspecified 04/16/2017  . PFIZER SARS-COV-2 Vaccination 10/13/2019, 11/06/2019  . Pneumococcal Conjugate-13 06/16/2015  . Pneumococcal Polysaccharide-23 07/19/2006, 06/06/2014  . Tdap 12/16/2016  . Zoster Recombinat (Shingrix) 12/16/2018, 03/25/2019   Depression screen [facility] 2/9 12/28/2019 06/29/2019 05/18/2019 02/16/2019 12/29/2018  Decreased Interest 0 - 0 0 1  Down, Depressed, Hopeless 0 - 0 1 1  PHQ - 2 Score 0 - 0 1 2  Altered sleeping - 0 1 1 0  Tired, decreased energy - 0 1 1 1   Change in appetite - 0 1 0 0  Feeling bad or failure about yourself  - 0 1 1 1   Trouble concentrating - 0 0 1 0  Moving slowly or fidgety/restless - 0 0 0 0  Suicidal thoughts - 0 0 0 0  PHQ-9 Score - - 4 5 4   Difficult doing work/chores - - - - Not difficult at all   No exam data present Ophthalmology: Dr. Ander Slade, appointment June 9.  History of open angle glaucoma, cataracts.  Dental: every 6 months, appt 2 weeks ago.   Exercise: 1-2 days per week. Limited with toe issue.  Body mass index is 45.19 kg/m. Wt Readings from Last 3 Encounters:  12/28/19 (!) 306 lb (138.8 kg)  12/17/19 290 lb (131.5 kg)  06/29/19 (!) 305 lb 3.2 oz (138.4 kg)      History Patient Active Problem List   Diagnosis Date Noted  . Chest discomfort 02/02/2019  . Morbid obesity ([place]) 02/02/2019  . Pain in right hand 02/15/2018  . Trigger finger 02/15/2018  . Cortical age-related cataract of both eyes 03/30/2017  . Nuclear sclerotic cataract of both eyes 03/30/2017  . Primary open angle glaucoma (POAG) of both eyes, indeterminate stage 03/30/2017  . Type 2 diabetes mellitus without complication, without long-term current use of insulin ([place]) 03/30/2017  . LVH (left ventricular hypertrophy) 03/29/2016  . FUO (fever of unknown origin) 02/16/2016  . Acute combined systolic and diastolic heart failure ([place])  02/16/2016  . Weight gain 02/16/2016  . Weight loss 02/16/2016  . Rash and nonspecific skin eruption 02/16/2016  . DOE (dyspnea on exertion) 02/16/2016  . T wave inversion in EKG 02/16/2016  . Transaminitis 02/16/2016  . Arthritis of big toe 08/23/2013  . Pain of left great toe 08/23/2013  . HTN (hypertension) 12/20/2011  . Diabetes mellitus ([place]) 12/20/2011  . Dyslipidemia 12/20/2011  . Glaucoma (increased eye pressure) 07/19/1996   Past Medical History:  Diagnosis Date  . Acute combined systolic and diastolic heart failure ([place]) 02/16/2016  . Allergy   . Anxiety    Phreesia 12/27/2019  . Diabetes mellitus   . Diabetes mellitus without complication ([place])    Phreesia 12/27/2019  . Diabetic foot ulcer ([place])   . FUO (fever of unknown origin) 02/16/2016  . Glaucoma   . Hyperlipidemia   . Hypertension   . LVH (left ventricular hypertrophy) 03/29/2016  .  Rash and nonspecific skin eruption 02/16/2016  . Sleep apnea    CPAP  . Transaminitis 02/16/2016   Past Surgical History:  Procedure Laterality Date  . HERNIA REPAIR    . SPINE SURGERY    . TUMOR REMOVAL     Bone Cyst   Allergies  Allergen Reactions  . Other Other (See Comments)    Hayfever   Prior to Admission medications   Medication Sig Start Date End Date Taking? Authorizing Provider  Ascorbic Acid (VITAMIN C) 1000 MG tablet Take 1,000 mg by mouth daily.   Yes [provider]  aspirin 81 MG tablet Take 81 mg by mouth daily.   Yes [provider]  atorvastatin (LIPITOR) 20 MG tablet TAKE ONE TABLET BY MOUTH DAILY 09/15/19  Yes [name], MD  BD PEN NEEDLE NANO U/F 32G X 4 MM MISC  07/04/18  Yes [provider]  brimonidine (ALPHAGAN) 0.2 % ophthalmic solution Place 1 drop into both eyes 2 (two) times daily.   Yes [provider]  calcium carbonate (TUMS - DOSED IN MG ELEMENTAL CALCIUM) 500 MG chewable tablet Chew 2 tablets by mouth daily as needed for indigestion or heartburn.    Yes [provider]  Cholecalciferol (VITAMIN D-1000 MAX ST) 1000 units tablet Take by mouth.   Yes [provider]  empagliflozin (JARDIANCE) 25 MG TABS tablet Take 25 mg by mouth daily. 12/16/16  Yes [name], MD  fexofenadine (ALLEGRA) 180 MG tablet Take 180 mg by mouth daily.   Yes [provider]  fluticasone (FLONASE) 50 MCG/ACT nasal spray PLACE 2 SPRAYS INTO BOTH NOSTRILS DAILY. 07/09/14  Yes Orma Flaming, MD  hydrOXYzine (ATARAX/VISTARIL) 25 MG tablet TAKE 1/2 -1 TABLETS BY MOUTH THREE TIMES A DAY AS NEEDED FOR ANXIETY 12/16/19  Yes [name], MD  Insulin Glargine [facility] KWIKPEN) 100 UNIT/ML SOPN Inject into the skin. 03/07/17  Yes [provider]  lisinopril (ZESTRIL) 40 MG tablet Take 1 tablet (40 mg total) by mouth daily. 02/02/19  Yes Minus Breeding, MD  metFORMIN (GLUCOPHAGE) 1000 MG tablet TAKE ONE TABLET BY MOUTH TWICE A DAY WITH A MEAL 12/18/19  Yes [name], MD  Multiple Vitamin (MULTIVITAMIN) tablet Take 1 tablet by mouth every evening.    Yes [provider]  Semaglutide,0.25 or 0.5MG /DOS, (OZEMPIC, 0.25 OR 0.5 MG/DOSE,) 2 MG/1.5ML SOPN Inject into the skin.   Yes [provider]  sertraline (ZOLOFT) 100 MG tablet TAKE ONE TABLET BY MOUTH DAILY 11/17/19  Yes [name], MD  silver sulfADIAZINE (SILVADENE) 1 % cream Apply pea-sized amount to wound daily. 07/04/18  Yes Evelina Bucy, DPM   Social History   Socioeconomic History  . Marital status: Married    Spouse name: Not on file  . Number of children: 2  . Years of education: 61  . Highest education level: Not on file  Occupational History  . Occupation: Tour manager  Tobacco Use  . Smoking status: Never Smoker  . Smokeless tobacco: Never Used  Vaping Use  . Vaping Use: Never used  Substance and Sexual Activity  . Alcohol use: No    Alcohol/week: 0.0 standard drinks    Comment: per pt 1 drink once in a long while  .  Drug use: No  . Sexual activity: Yes    Partners: Female  Other Topics Concern  . Not on file  Social History Narrative   Married. Education: [employer].    Social  Determinants of Health   Financial Resource Strain:   . Difficulty of Paying Living Expenses:   Food Insecurity:   . Worried About Charity fundraiser in the Last Year:   . Arboriculturist in the Last Year:   Transportation Needs:   . Film/video editor (Medical):   Marland Kitchen Lack of Transportation (Non-Medical):   Physical Activity:   . Days of Exercise per Week:   . Minutes of Exercise per Session:   Stress:   . Feeling of Stress :   Social Connections:   . Frequency of Communication with Friends and Family:   . Frequency of Social Gatherings with Friends and Family:   . Attends Religious Services:   . Active Member of Clubs or Organizations:   . Attends Archivist Meetings:   Marland Kitchen Marital Status:   Intimate Partner Violence:   . Fear of Current or Ex-Partner:   . Emotionally Abused:   Marland Kitchen Physically Abused:   . Sexually Abused:     Review of Systems   Objective:   Vitals:   12/28/19 0814  BP: 131/82  Pulse: 80  Temp: 98.7 F (37.1 C)  TempSrc: Temporal  SpO2: 94%  Weight: (!) 306 lb (138.8 kg)  Height: 5\' 9"  (1.753 m)     Physical Exam Vitals reviewed.  Constitutional:      Appearance: He is well-developed.  HENT:     Head: Normocephalic and atraumatic.     Right Ear: External ear normal.     Left Ear: External ear normal.  Eyes:     Conjunctiva/sclera: Conjunctivae normal.     Pupils: Pupils are equal, round, and reactive to light.  Neck:     Thyroid: No thyromegaly.  Cardiovascular:     Rate and Rhythm: Normal rate and regular rhythm.     Heart sounds: Normal heart sounds.  Pulmonary:     Effort: Pulmonary effort is normal. No respiratory distress.     Breath sounds: Normal breath sounds. No wheezing.  Abdominal:     General: There is no distension.     Palpations: Abdomen is  soft.     Tenderness: There is no abdominal tenderness.  Musculoskeletal:        General: No tenderness. Normal range of motion.     Cervical back: Normal range of motion and neck supple.  Lymphadenopathy:     Cervical: No cervical adenopathy.  Skin:    General: Skin is warm and dry.  Neurological:     Mental Status: He is alert and oriented to person, place, and time.     Deep Tendon Reflexes: Reflexes are normal and symmetric.  Psychiatric:        Behavior: Behavior normal.      54 min with greater than 50% counsleing, chart review, ER note review, specialist note review.  Assessment & Plan:  Brandon Rich is a 59 y.o. male . Annual physical exam  - -anticipatory guidance as below in AVS, screening labs above. Health maintenance items as above in HPI discussed/recommended as applicable.   Essential hypertension -   The current medical regimen is effective;  continue present plan and medications.  Hyperlipidemia, unspecified hyperlipidemia type - Plan: Comprehensive metabolic panel, Lipid panel, atorvastatin (LIPITOR) 20 MG tablet  -  Stable, tolerating current regimen. Medications refilled. Labs pending as above.   Type 2 diabetes mellitus with foot ulcer, with long-term current use of insulin ([place]) - Plan: Microalbumin / creatinine urine ratio  -  Followed by endocrinology.  Check urine microalbumin ratio.  Psychophysiological insomnia - Plan: hydrOXYzine (ATARAX/VISTARIL) 25 MG tablet Adjustment disorder with mixed anxiety and depressed mood - Plan: hydrOXYzine (ATARAX/VISTARIL) 25 MG tablet Anxiety state - Plan: sertraline (ZOLOFT) 100 MG tablet  -Zoloft stable with current dosing.  Hydroxyzine as needed for insomnia.  No dose changes for now.  Recheck 6 months.  Heart palpitations Tachycardia  -Asymptomatic since ER visit.  Still recommend follow-up with cardiology to decide on outpatient monitoring.  RTC/ER precautions  Family history of prostate cancer in father -  Plan: PSA Screening for prostate cancer - Plan: PSA  -Repeat PSA, then urology follow-up as needed  Leukocytosis, unspecified type - Plan: CBC  -Afebrile, repeat labs from elevated readings in ER.  Possibly volume depletion component at that time.  Meds ordered this encounter  Medications  . hydrOXYzine (ATARAX/VISTARIL) 25 MG tablet    Sig: Take 0.5-1 tablets (12.5-25 mg total) by mouth at bedtime as needed for anxiety.    Dispense:  90 tablet    Refill:  1  . atorvastatin (LIPITOR) 20 MG tablet    Sig: Take 1 tablet (20 mg total) by mouth daily.    Dispense:  90 tablet    Refill:  1  . lisinopril (ZESTRIL) 40 MG tablet    Sig: Take 1 tablet (40 mg total) by mouth daily.    Dispense:  90 tablet    Refill:  3  . sertraline (ZOLOFT) 100 MG tablet    Sig: Take 1 tablet (100 mg total) by mouth daily.    Dispense:  90 tablet    Refill:  1   Patient Instructions   Follow up with Dr. Percival Spanish about the recent elevated heart rate.  Return to the clinic or go to the nearest emergency room if any of your symptoms worsen or new symptoms occur. I will check blood counts for follow-up from the ER.  If any fevers, return to be seen here or other care provider.  No other medication changes for now, keep follow-up with specialists including for foot wound.  Recheck with me in 6 months for medication review.  Hydroxyzine 1/2 to 1 pill bedtime as needed.  Continue Zoloft same dose  I will repeat PSA today.  If increasing, can follow back up with urology.  Keeping you healthy  Get these tests  Blood pressure- Have your blood pressure checked once a year by your healthcare provider.  Normal blood pressure is 120/80  Weight- Have your body mass index (BMI) calculated to screen for obesity.  BMI is a measure of body fat based on height and weight. You can also calculate your own BMI at [email or web address].[email or web address].  Cholesterol- Have your cholesterol checked every year.  Diabetes- Have your  blood sugar checked regularly if you have high blood pressure, high cholesterol, have a family history of diabetes or if you are overweight.  Screening for Colon Cancer- Colonoscopy starting at age 36.  Screening may begin sooner depending on your family history and other health conditions. Follow up colonoscopy as directed by your Gastroenterologist.  Screening for Prostate Cancer- Both blood work (PSA) and a rectal exam help screen for Prostate Cancer.  Screening begins at age 30 with African-American men and at age 43 with Caucasian men.  Screening may begin sooner depending on your family history.  Take these medicines  Aspirin- One aspirin daily can help prevent Heart disease and Stroke.  Flu shot- Every fall.  Tetanus- Every 10 years.  Zostavax- Once after the age of 59 to prevent Shingles.  Pneumonia shot- Once after the age of 36; if you are younger than 81, ask your healthcare provider if you need a Pneumonia shot.  Take these steps  Don't smoke- If you do smoke, talk to your doctor about quitting.  For tips on how to quit, go to www.smokefree.gov or call 1-800-QUIT-NOW.  Be physically active- Exercise 5 days a week for at least 30 minutes.  If you are not already physically active start slow and gradually work up to 30 minutes of moderate physical activity.  Examples of moderate activity include walking briskly, mowing the yard, dancing, swimming, bicycling, etc.  Eat a healthy diet- Eat a variety of healthy food such as fruits, vegetables, low fat milk, low fat cheese, yogurt, lean meant, poultry, fish, beans, tofu, etc. For more information go to www.thenutritionsource.org  Drink alcohol in moderation- Limit alcohol intake to less than two drinks a day. Never drink and drive.  Dentist- Brush and floss twice daily; visit your dentist twice a year.  Depression- Your emotional health is as important as your physical health. If you're feeling down, or losing interest in things  you would normally enjoy please talk to your healthcare provider.  Eye exam- Visit your eye doctor every year.  Safe sex- If you may be exposed to a sexually transmitted infection, use a condom.  Seat belts- Seat belts can save your life; always wear one.  Smoke/Carbon Monoxide detectors- These detectors need to be installed on the appropriate level of your home.  Replace batteries at least once a year.  Skin cancer- When out in the sun, cover up and use sunscreen 15 SPF or higher.  Violence- If anyone is threatening you, please tell your healthcare provider.  Living Will/ Health care power of attorney- Speak with your healthcare provider and family.  If you have lab work done today you will be contacted with your lab results within the next 2 weeks.  If you have not heard from [place] then please contact us. The fastest way to get your results is to register for My Chart.   IF you received an x-ray today, you will receive an invoice from [facility]. Please contact [facility] at [phone] with questions or concerns regarding your invoice.   IF you received labwork today, you will receive an invoice from [place]. Please contact [facility] at [phone] with questions or concerns regarding your invoice.   Our billing staff will not be able to assist you with questions regarding bills from these companies.  You will be contacted with the lab results as soon as they are available. The fastest way to get your results is to activate your My Chart account. Instructions are located on the last page of this paperwork. If you have not heard from [place] regarding the results in 2 weeks, please contact this office.         Signed, [name], MD [facility]

## 2019-12-28 NOTE — Patient Instructions (Addendum)
Follow up with Dr. Percival Spanish about the recent elevated heart rate.  Return to the clinic or go to the nearest emergency room if any of your symptoms worsen or new symptoms occur. I will check blood counts for follow-up from the ER.  If any fevers, return to be seen here or other care provider.  No other medication changes for now, keep follow-up with specialists including for foot wound.  Recheck with me in 6 months for medication review.  Hydroxyzine 1/2 to 1 pill bedtime as needed.  Continue Zoloft same dose  I will repeat PSA today.  If increasing, can follow back up with urology.  Keeping you healthy  Get these tests  Blood pressure- Have your blood pressure checked once a year by your healthcare provider.  Normal blood pressure is 120/80  Weight- Have your body mass index (BMI) calculated to screen for obesity.  BMI is a measure of body fat based on height and weight. You can also calculate your own BMI at ViewBanking.[email or web address].  Cholesterol- Have your cholesterol checked every year.  Diabetes- Have your blood sugar checked regularly if you have high blood pressure, high cholesterol, have a family history of diabetes or if you are overweight.  Screening for Colon Cancer- Colonoscopy starting at age 43.  Screening may begin sooner depending on your family history and other health conditions. Follow up colonoscopy as directed by your Gastroenterologist.  Screening for Prostate Cancer- Both blood work (PSA) and a rectal exam help screen for Prostate Cancer.  Screening begins at age 34 with African-American men and at age 27 with Caucasian men.  Screening may begin sooner depending on your family history.  Take these medicines  Aspirin- One aspirin daily can help prevent Heart disease and Stroke.  Flu shot- Every fall.  Tetanus- Every 10 years.  Zostavax- Once after the age of 71 to prevent Shingles.  Pneumonia shot- Once after the age of 54; if you are younger than 39, ask  your healthcare provider if you need a Pneumonia shot.  Take these steps  Don't smoke- If you do smoke, talk to your doctor about quitting.  For tips on how to quit, go to www.smokefree.gov or call 1-800-QUIT-NOW.  Be physically active- Exercise 5 days a week for at least 30 minutes.  If you are not already physically active start slow and gradually work up to 30 minutes of moderate physical activity.  Examples of moderate activity include walking briskly, mowing the yard, dancing, swimming, bicycling, etc.  Eat a healthy diet- Eat a variety of healthy food such as fruits, vegetables, low fat milk, low fat cheese, yogurt, lean meant, poultry, fish, beans, tofu, etc. For more information go to www.thenutritionsource.org  Drink alcohol in moderation- Limit alcohol intake to less than two drinks a day. Never drink and drive.  Dentist- Brush and floss twice daily; visit your dentist twice a year.  Depression- Your emotional health is as important as your physical health. If you're feeling down, or losing interest in things you would normally enjoy please talk to your healthcare provider.  Eye exam- Visit your eye doctor every year.  Safe sex- If you may be exposed to a sexually transmitted infection, use a condom.  Seat belts- Seat belts can save your life; always wear one.  Smoke/Carbon Monoxide detectors- These detectors need to be installed on the appropriate level of your home.  Replace batteries at least once a year.  Skin cancer- When out in the sun, cover up and use  sunscreen 15 SPF or higher.  Violence- If anyone is threatening you, please tell your healthcare provider.  Living Will/ Health care power of attorney- Speak with your healthcare provider and family.  If you have lab work done today you will be contacted with your lab results within the next 2 weeks.  If you have not heard from [place] then please contact us. The fastest way to get your results is to register for My  Chart.   IF you received an x-ray today, you will receive an invoice from [facility]. Please contact [facility] at [phone] with questions or concerns regarding your invoice.   IF you received labwork today, you will receive an invoice from [place]. Please contact [facility] at [phone] with questions or concerns regarding your invoice.   Our billing staff will not be able to assist you with questions regarding bills from these companies.  You will be contacted with the lab results as soon as they are available. The fastest way to get your results is to activate your My Chart account. Instructions are located on the last page of this paperwork. If you have not heard from [place] regarding the results in 2 weeks, please contact this office.

## 2019-12-29 ENCOUNTER — Encounter: Payer: Self-pay | Admitting: Family Medicine

## 2019-12-29 LAB — CBC
Hematocrit: 45 % (ref 37.5–51.0)
Hemoglobin: 15 g/dL (ref 13.0–17.7)
MCH: 27.4 pg (ref 26.6–33.0)
MCHC: 33.3 g/dL (ref 31.5–35.7)
MCV: 82 fL (ref 79–97)
Platelets: 252 10*3/uL (ref 150–450)
RBC: 5.47 x10E6/uL (ref 4.14–5.80)
RDW: 14 % (ref 11.6–15.4)
WBC: 8.8 10*3/uL (ref 3.4–10.8)

## 2019-12-29 LAB — PSA: Prostate Specific Ag, Serum: 2.2 ng/mL (ref 0.0–4.0)

## 2019-12-29 LAB — COMPREHENSIVE METABOLIC PANEL
ALT: 27 IU/L (ref 0–44)
AST: 50 IU/L — ABNORMAL HIGH (ref 0–40)
Albumin/Globulin Ratio: 2 (ref 1.2–2.2)
Albumin: 4.8 g/dL (ref 3.8–4.9)
Alkaline Phosphatase: 71 IU/L (ref 48–121)
BUN/Creatinine Ratio: 14 (ref 9–20)
BUN: 13 mg/dL (ref 6–24)
Bilirubin Total: 0.3 mg/dL (ref 0.0–1.2)
CO2: 21 mmol/L (ref 20–29)
Calcium: 9.2 mg/dL (ref 8.7–10.2)
Chloride: 105 mmol/L (ref 96–106)
Creatinine, Ser: 0.9 mg/dL (ref 0.76–1.27)
GFR calc Af Amer: 108 mL/min/{1.73_m2} (ref >59)
GFR calc non Af Amer: 93 mL/min/{1.73_m2} (ref >59)
Globulin, Total: 2.4 g/dL (ref 1.5–4.5)
Glucose: 136 mg/dL — ABNORMAL HIGH (ref 65–99)
Potassium: 3.8 mmol/L (ref 3.5–5.2)
Sodium: 142 mmol/L (ref 134–144)
Total Protein: 7.2 g/dL (ref 6.0–8.5)

## 2019-12-29 LAB — LIPID PANEL
Chol/HDL Ratio: 3.4 ratio (ref 0.0–5.0)
Cholesterol, Total: 106 mg/dL (ref 100–199)
HDL: 31 mg/dL — ABNORMAL LOW (ref >39)
LDL Chol Calc (NIH): 51 mg/dL (ref 0–99)
Triglycerides: 132 mg/dL (ref 0–149)
VLDL Cholesterol Cal: 24 mg/dL (ref 5–40)

## 2019-12-29 LAB — MICROALBUMIN / CREATININE URINE RATIO
Creatinine, Urine: 118.6 mg/dL
Microalb/Creat Ratio: 56 mg/g creat — ABNORMAL HIGH (ref 0–29)
Microalbumin, Urine: 66.1 ug/mL

## 2020-01-15 ENCOUNTER — Encounter: Payer: Self-pay | Admitting: Family Medicine

## 2020-01-15 NOTE — Progress Notes (Signed)
Cardiology Office Note   Date:  01/16/2020   ID:  CONNERY SHIFFLER, DOB Jan 24, 1961, MRN [id number]  PCP:  [name], MD  Cardiologist:   No primary care provider on file. Referring:  [name], MD  Chief Complaint  Patient presents with  . Palpitations     History of Present Illness: Brandon Rich is a 59 y.o. male who is referred by [name], MD for evaluation of chest pain.  The patient has no past cardiac history although he had a fever of unknown origin got an extensive work-up 3 years ago.  He did have an echo in 2017 which was normal .  He had T wave inversion on an EKG.   He had a negative perfusion study last August.  He was in the ED with tachycardia last week.  I reviewed these records for this visit.    He had a negative CT for PE and no enzyme elevations.  EKG demonstrated sinus tach.   His heart rate improved with hydration.     He said that he developed a rapid heart rate while he was asleep.  He woke up in took his heart rate and found it to be in the 140s.  [facility] but was told to go to the ER.  I did review these records and it appeared to be sinus that slowly resolved with hydration.  Enzymes were negative.  He had no chest pain.  He has not had this since then or before.  He has had no presyncope.    Of note he is going to have surgery and is going to lose any weight and he will to osteomyelitis related to a nonhealing diabetic ulcer.  Past Medical History:  Diagnosis Date  . Acute combined systolic and diastolic heart failure ([place]) 02/16/2016  . Allergy   . Anxiety    Phreesia 12/27/2019  . Diabetes mellitus   . Diabetes mellitus without complication ([place])    Phreesia 12/27/2019  . Diabetic foot ulcer ([place])   . FUO (fever of unknown origin) 02/16/2016  . Glaucoma   . Hyperlipidemia   . Hypertension   . LVH (left ventricular hypertrophy) 03/29/2016  . Rash and nonspecific skin eruption 02/16/2016  . Sleep apnea      CPAP  . Transaminitis 02/16/2016    Past Surgical History:  Procedure Laterality Date  . HERNIA REPAIR    . SPINE SURGERY    . TUMOR REMOVAL     Bone Cyst     Current Outpatient Medications  Medication Sig Dispense Refill  . Ascorbic Acid (VITAMIN C) 1000 MG tablet Take 1,000 mg by mouth daily.    Marland Kitchen aspirin 81 MG tablet Take 81 mg by mouth daily.    Marland Kitchen atorvastatin (LIPITOR) 20 MG tablet Take 1 tablet (20 mg total) by mouth daily. 90 tablet 1  . BD PEN NEEDLE NANO U/F 32G X 4 MM MISC     . calcium carbonate (TUMS - DOSED IN MG ELEMENTAL CALCIUM) 500 MG chewable tablet Chew 2 tablets by mouth daily as needed for indigestion or heartburn.    . Cholecalciferol (VITAMIN D-1000 MAX ST) 1000 units tablet Take by mouth.    . dorzolamide-timolol (COSOPT) 22.3-6.8 MG/ML ophthalmic solution Apply to eye.    . empagliflozin (JARDIANCE) 25 MG TABS tablet Take 25 mg by mouth daily. 30 tablet 3  . fexofenadine (ALLEGRA) 180 MG tablet Take 180 mg by mouth  daily.    . fluticasone (FLONASE) 50 MCG/ACT nasal spray PLACE 2 SPRAYS INTO BOTH NOSTRILS DAILY. 16 g 10  . hydrOXYzine (ATARAX/VISTARIL) 25 MG tablet Take 0.5-1 tablets (12.5-25 mg total) by mouth at bedtime as needed for anxiety. 90 tablet 1  . Insulin Glargine (BASAGLAR KWIKPEN) 100 UNIT/ML SOPN Inject into the skin.    Marland Kitchen lisinopril (ZESTRIL) 40 MG tablet Take 1 tablet (40 mg total) by mouth daily. 90 tablet 3  . metFORMIN (GLUCOPHAGE) 1000 MG tablet TAKE ONE TABLET BY MOUTH TWICE A DAY WITH A MEAL 180 tablet 0  . Multiple Vitamin (MULTIVITAMIN) tablet Take 1 tablet by mouth every evening.     . Semaglutide,0.25 or 0.5MG /DOS, (OZEMPIC, 0.25 OR 0.5 MG/DOSE,) 2 MG/1.5ML SOPN Inject into the skin.    Marland Kitchen sertraline (ZOLOFT) 100 MG tablet Take 1 tablet (100 mg total) by mouth daily. 90 tablet 1  . silver sulfADIAZINE (SILVADENE) 1 % cream Apply pea-sized amount to wound daily. 50 g 0   No current facility-administered medications for this visit.     Allergies:   Other    ROS:  Please see the history of present illness.   Otherwise, review of systems are positive for none.   All other systems are reviewed and negative.    PHYSICAL EXAM: VS:  BP 130/85   Pulse 71   Temp (!) 97.2 F (36.2 C)   Ht 5\' 9"  (1.753 m)   Wt (!) 306 lb 3.2 oz (138.9 kg)   SpO2 95%   BMI 45.22 kg/m  , BMI Body mass index is 45.22 kg/m. GENERAL:  Well appearing NECK:  No jugular venous distention, waveform within normal limits, carotid upstroke brisk and symmetric, no bruits, no thyromegaly LUNGS:  Clear to auscultation bilaterally CHEST:  Unremarkable HEART:  PMI not displaced or sustained,S1 and S2 within normal limits, no S3, no S4, no clicks, no rubs, no murmurs ABD:  Flat, positive bowel sounds normal in frequency in pitch, no bruits, no rebound, no guarding, no midline pulsatile mass, no hepatomegaly, no splenomegaly EXT:  2 plus pulses throughout, no edema, no cyanosis no clubbing   EKG:  EKG is  ordered today. The ekg ordered today demonstrates sinus rhythm, rate 71, first-degree AV block, axis within normal limits, poor anterior R wave progression .     Recent Labs: 12/17/2019: B Natriuretic Peptide 300.2 12/28/2019: ALT 27; BUN 13; Creatinine, Ser 0.90; Hemoglobin 15.0; Platelets 252; Potassium 3.8; Sodium 142    Lipid Panel    Component Value Date/Time   CHOL 106 12/28/2019 0926   TRIG 132 12/28/2019 0926   HDL 31 (L) 12/28/2019 0926   CHOLHDL 3.4 12/28/2019 0926   CHOLHDL 3.8 06/03/2016 1759   VLDL 35 (H) 06/03/2016 1759   LDLCALC 51 12/28/2019 0926      Wt Readings from Last 3 Encounters:  01/16/20 (!) 306 lb 3.2 oz (138.9 kg)  12/28/19 (!) 306 lb (138.8 kg)  12/17/19 290 lb (131.5 kg)      Other studies Reviewed: Additional studies/ records that were reviewed today include: ED records. Review of the above records demonstrates:  See elsewhere  ASSESSMENT AND PLAN:   TACHYCARDIA: This appears to be sinus  tachycardia.  At this point no further work-up is suggested.  He did have a TSH/it was normal.  Is not having otherwise tachycardia.  Electrolytes are unremarkable.  No further work-up.  I have advised him to get an Alive Cor device for home monitor.  HTN:  His blood pressure is at target.  No change in therapy.   DYSLIPIDEMIA: LDL is 34 with HDL 31.  No change in therapy.   OBESITY:   I will refer him to [facility]. .  We had a long discussion about this.    PREOP: The patient is due to have surgery.  He had negative troponins with his recent rapid heart rate.  He had a negative stress perfusion study couple of years ago.  Has had no new symptoms.  No change in therapy is indicated.     Current medicines are reviewed at length with the patient today.  The patient does not have concerns regarding medicines.  The following changes have been made:  None  Labs/ tests ordered today include:  None  Orders Placed This Encounter  Procedures  . Amb Ref to [facility]  . EKG 12-Lead     Disposition:   FU with me as needed    Signed, Minus Breeding, MD  01/16/2020 9:02 AM    [place]

## 2020-01-16 ENCOUNTER — Encounter: Payer: Self-pay | Admitting: Cardiology

## 2020-01-16 ENCOUNTER — Ambulatory Visit: Payer: Managed Care, Other (non HMO) | Admitting: Cardiology

## 2020-01-16 ENCOUNTER — Other Ambulatory Visit: Payer: Self-pay

## 2020-01-16 VITALS — BP 130/85 | HR 71 | Temp 97.2°F | Ht 69.0 in | Wt 306.2 lb

## 2020-01-16 DIAGNOSIS — R000 Tachycardia, unspecified: Secondary | ICD-10-CM | POA: Diagnosis not present

## 2020-01-16 DIAGNOSIS — R0789 Other chest pain: Secondary | ICD-10-CM

## 2020-01-16 DIAGNOSIS — Z7189 Other specified counseling: Secondary | ICD-10-CM

## 2020-01-16 DIAGNOSIS — E785 Hyperlipidemia, unspecified: Secondary | ICD-10-CM | POA: Diagnosis not present

## 2020-01-16 NOTE — Patient Instructions (Signed)
Medication Instructions:  Your physician recommends that you continue on your current medications as directed. Please refer to the Current Medication list given to you today.  *If you need a refill on your cardiac medications before your next appointment, please call your pharmacy*  Follow-Up: At [facility], you and your health needs are our priority.  As part of our continuing mission to provide you with exceptional heart care, we have created designated Provider Care Teams.  These Care Teams include your primary Cardiologist (physician) and Advanced Practice Providers (APPs -  Physician Assistants and Nurse Practitioners) who all work together to provide you with the care you need, when you need it.  We recommend signing up for the patient portal called "MyChart".  Sign up information is provided on this After Visit Summary.  MyChart is used to connect with patients for Virtual Visits (Telemedicine).  Patients are able to view lab/test results, encounter notes, upcoming appointments, etc.  Non-urgent messages can be sent to your provider as well.   To learn more about what you can do with MyChart, go to [email or web address].    Your next appointment:   AS NEEDED with Dr. Percival Spanish    Other Instructions Raylene Everts Cor

## 2020-01-17 HISTORY — PX: AMPUTATION TOE: SHX6595

## 2020-01-28 ENCOUNTER — Encounter (INDEPENDENT_AMBULATORY_CARE_PROVIDER_SITE_OTHER): Payer: Self-pay

## 2020-01-31 ENCOUNTER — Encounter: Payer: Self-pay | Admitting: Family Medicine

## 2020-03-17 ENCOUNTER — Ambulatory Visit: Payer: Managed Care, Other (non HMO)

## 2020-03-18 ENCOUNTER — Ambulatory Visit: Payer: Managed Care, Other (non HMO) | Attending: Internal Medicine

## 2020-03-18 DIAGNOSIS — Z23 Encounter for immunization: Secondary | ICD-10-CM

## 2020-03-18 MED FILL — PFIZER-BIONTECH COVID-19 VA: 30 | 1 days supply | Qty: 2 | Fill #0

## 2020-03-18 NOTE — Progress Notes (Signed)
   Covid-19 Vaccination Clinic  Name:  RAFAY DAHAN    MRN: [id number] DOB: 1960-11-14  03/18/2020  Mr. Sperling was observed post Covid-19 immunization for 15 minutes without incident. He was provided with Vaccine Information Sheet and instruction to access the V-Safe system.   Mr. Muns was instructed to call 911 with any severe reactions post vaccine: Marland Kitchen Difficulty breathing  . Swelling of face and throat  . A fast heartbeat  . A bad rash all over body  . Dizziness and weakness

## 2020-04-01 DIAGNOSIS — M205X1 Other deformities of toe(s) (acquired), right foot: Secondary | ICD-10-CM | POA: Insufficient documentation

## 2020-04-18 HISTORY — PX: AMPUTATION TOE: SHX6595

## 2020-06-27 ENCOUNTER — Ambulatory Visit (INDEPENDENT_AMBULATORY_CARE_PROVIDER_SITE_OTHER): Payer: Managed Care, Other (non HMO) | Admitting: Family Medicine

## 2020-06-27 ENCOUNTER — Other Ambulatory Visit: Payer: Self-pay

## 2020-06-27 ENCOUNTER — Encounter: Payer: Self-pay | Admitting: Family Medicine

## 2020-06-27 VITALS — BP 131/80 | HR 72 | Temp 98.0°F | Ht 69.0 in | Wt 309.0 lb

## 2020-06-27 DIAGNOSIS — F411 Generalized anxiety disorder: Secondary | ICD-10-CM

## 2020-06-27 DIAGNOSIS — E11621 Type 2 diabetes mellitus with foot ulcer: Secondary | ICD-10-CM | POA: Diagnosis not present

## 2020-06-27 DIAGNOSIS — F4323 Adjustment disorder with mixed anxiety and depressed mood: Secondary | ICD-10-CM

## 2020-06-27 DIAGNOSIS — E785 Hyperlipidemia, unspecified: Secondary | ICD-10-CM | POA: Diagnosis not present

## 2020-06-27 DIAGNOSIS — L97509 Non-pressure chronic ulcer of other part of unspecified foot with unspecified severity: Secondary | ICD-10-CM

## 2020-06-27 DIAGNOSIS — I10 Essential (primary) hypertension: Secondary | ICD-10-CM | POA: Diagnosis not present

## 2020-06-27 DIAGNOSIS — R809 Proteinuria, unspecified: Secondary | ICD-10-CM | POA: Diagnosis not present

## 2020-06-27 DIAGNOSIS — F5104 Psychophysiologic insomnia: Secondary | ICD-10-CM

## 2020-06-27 DIAGNOSIS — Z794 Long term (current) use of insulin: Secondary | ICD-10-CM

## 2020-06-27 LAB — LIPID PANEL

## 2020-06-27 MED ORDER — ATORVASTATIN CALCIUM 20 MG PO TABS: 20.0000 mg | ORAL_TABLET | Freq: Every day | ORAL | 1 refills | Status: DC

## 2020-06-27 MED ORDER — SERTRALINE HCL 100 MG PO TABS: 100.0000 mg | ORAL_TABLET | Freq: Every day | ORAL | 1 refills | Status: DC

## 2020-06-27 MED ORDER — HYDROXYZINE HCL 25 MG PO TABS: 12.5000 mg | ORAL_TABLET | Freq: Every evening | ORAL | 1 refills | Status: DC | PRN

## 2020-06-27 MED ORDER — LISINOPRIL 40 MG PO TABS: 40.0000 mg | ORAL_TABLET | Freq: Every day | ORAL | 3 refills | Status: DC

## 2020-06-27 NOTE — Progress Notes (Signed)
Subjective:  Patient ID: Brandon Rich, male    DOB: 1961/01/22  Age: 59 y.o. MRN: [id number]  CC:  Chief Complaint  Patient presents with  . Follow-up    On hypertension, diabetes,hyperlipidemia,and Psychophysiological insomnia. PT reports since last OV pt has felt these conditions have been we controlled. Pt reports since last OV he has had 2 amputations on his R foot. Pt had the great toe and second digit removed due to diabetes.    HPI KYRIAN STAGE presents for   Follow-up.   Diabetes: He is followed by endocrinology for his diabetes.  Complicated by diabetic polyneuropathy, microalbuminuria and unfortunately has had multiple toe amputations. With obesity.  Last visit with endocrinology December 8.- blood work deferred to me.   Orthopedics Dr. Francee Gentile with [facility].  He is on ACE inhibitor and statin.  Fasting today. Has been referred to [facility] - has not met with them yet.  Body mass index is 45.63 kg/m. Wt Readings from Last 3 Encounters:  06/27/20 (!) 309 lb (140.2 kg)  01/16/20 (!) 306 lb 3.2 oz (138.9 kg)  12/28/19 (!) 306 lb (138.8 kg)      Hypertension: Lisinopril 40 mg daily No new side effects.  Prior evaluation by cardiology with negative myocardial perfusion study August 2020 appt in June with Dr. Percival Spanish.note reviewed -no med changes.  Home readings: 120/75-140/80.  BP Readings from Last 3 Encounters:  06/27/20 131/80  01/16/20 130/85  12/28/19 131/82   Lab Results  Component Value Date   CREATININE 0.90 12/28/2019   Hyperlipidemia: Lipitor 20 mg daily.  No new myalgias/side effects.  Lab Results  Component Value Date   CHOL 106 12/28/2019   HDL 31 (L) 12/28/2019   LDLCALC 51 12/28/2019   TRIG 132 12/28/2019   CHOLHDL 3.4 12/28/2019   Lab Results  Component Value Date   ALT 27 12/28/2019   AST 50 (H) 12/28/2019   ALKPHOS 71 12/28/2019   BILITOT 0.3 12/28/2019   Anxiety: Feels like  anxiety doing well on current regimen.  counseling still once per month. No new side effects. zoloft 190m qd.  Hydroxyzine 1/2 pill per night working well for sleep. Well rested during the day. 7-8 hrs sleep.  [facility] 06/27/2020 12/28/2019 06/29/2019 05/18/2019 02/16/2019  Decreased Interest 0 0 - 0 0  Down, Depressed, Hopeless 0 0 - 0 1  PHQ - 2 Score 0 0 - 0 1  Altered sleeping - - 0 1 1  Tired, decreased energy - - 0 1 1  Change in appetite - - 0 1 0  Feeling bad or failure about yourself  - - 0 1 1  Trouble concentrating - - 0 0 1  Moving slowly or fidgety/restless - - 0 0 0  Suicidal thoughts - - 0 0 0  PHQ-9 Score - - - 4 5  Difficult doing work/chores - - - - -  Some recent data might be hidden    History Patient Active Problem List   Diagnosis Date Noted  . Chest discomfort 02/02/2019  . Morbid obesity ([place] 02/02/2019  . Pain in right hand 02/15/2018  . Trigger finger 02/15/2018  . Cortical age-related cataract of both eyes 03/30/2017  . Nuclear sclerotic cataract of both eyes 03/30/2017  . Primary open angle glaucoma (POAG) of both eyes, indeterminate stage 03/30/2017  . Type 2 diabetes mellitus without complication, without long-term current use of insulin ([place] 03/30/2017  .  LVH (left ventricular hypertrophy) 03/29/2016  . FUO (fever of unknown origin) 02/16/2016  . Acute combined systolic and diastolic heart failure ([place]) 02/16/2016  . Weight gain 02/16/2016  . Weight loss 02/16/2016  . Rash and nonspecific skin eruption 02/16/2016  . DOE (dyspnea on exertion) 02/16/2016  . T wave inversion in EKG 02/16/2016  . Transaminitis 02/16/2016  . Arthritis of big toe 08/23/2013  . Pain of left great toe 08/23/2013  . HTN (hypertension) 12/20/2011  . Diabetes mellitus ([place]) 12/20/2011  . Dyslipidemia 12/20/2011  . Glaucoma (increased eye pressure) 07/19/1996   Past Medical History:  Diagnosis Date  . Acute combined systolic and diastolic heart  failure ([place]) 02/16/2016  . Allergy   . Anxiety    Phreesia 12/27/2019  . Diabetes mellitus   . Diabetes mellitus without complication ([place])    Phreesia 12/27/2019  . Diabetic foot ulcer ([place])   . FUO (fever of unknown origin) 02/16/2016  . Glaucoma   . Hyperlipidemia   . Hypertension   . LVH (left ventricular hypertrophy) 03/29/2016  . Rash and nonspecific skin eruption 02/16/2016  . Sleep apnea    CPAP  . Transaminitis 02/16/2016   Past Surgical History:  Procedure Laterality Date  . HERNIA REPAIR    . SPINE SURGERY    . TUMOR REMOVAL     Bone Cyst   Allergies  Allergen Reactions  . Other Other (See Comments)    Hayfever   Prior to Admission medications   Medication Sig Start Date End Date Taking? Authorizing Provider  Ascorbic Acid (VITAMIN C) 1000 MG tablet Take 1,000 mg by mouth daily.   Yes [provider]  aspirin 81 MG tablet Take 81 mg by mouth daily.   Yes [provider]  atorvastatin (LIPITOR) 20 MG tablet Take 1 tablet (20 mg total) by mouth daily. 12/28/19  Yes [name], MD  BD PEN NEEDLE NANO U/F 32G X 4 MM MISC  07/04/18  Yes [provider]  calcium carbonate (TUMS - DOSED IN MG ELEMENTAL CALCIUM) 500 MG chewable tablet Chew 2 tablets by mouth daily as needed for indigestion or heartburn.   Yes [provider]  Cholecalciferol 25 MCG (1000 UT) tablet Take by mouth.   Yes [provider]  dorzolamide-timolol (COSOPT) 22.3-6.8 MG/ML ophthalmic solution Apply to eye. 12/19/19 12/25/20 Yes [provider]  empagliflozin (JARDIANCE) 25 MG TABS tablet Take 25 mg by mouth daily. 12/16/16  Yes [name], MD  fexofenadine (ALLEGRA) 180 MG tablet Take 180 mg by mouth daily.   Yes [provider]  fluticasone (FLONASE) 50 MCG/ACT nasal spray PLACE 2 SPRAYS INTO BOTH NOSTRILS DAILY. 07/09/14  Yes GuestBenn Moulder, MD  hydrOXYzine (ATARAX/VISTARIL) 25 MG tablet Take 0.5-1 tablets (12.5-25 mg total) by  mouth at bedtime as needed for anxiety. 12/28/19  Yes [name], MD  Insulin Glargine [facility]) 100 UNIT/ML SOPN Inject into the skin. 03/07/17  Yes [provider]  lisinopril (ZESTRIL) 40 MG tablet Take 1 tablet (40 mg total) by mouth daily. 12/28/19  Yes [name], MD  metFORMIN (GLUCOPHAGE) 1000 MG tablet TAKE ONE TABLET BY MOUTH TWICE A DAY WITH A MEAL 12/18/19  Yes [name], MD  Multiple Vitamin (MULTIVITAMIN) tablet Take 1 tablet by mouth every evening.    Yes [provider]  Semaglutide,0.25 or 0.5MG/DOS, 2 MG/1.5ML SOPN Inject into the skin.   Yes [provider]  sertraline (ZOLOFT) 100 MG tablet Take  1 tablet (100 mg total) by mouth daily. 12/28/19  Yes [name], MD  silver sulfADIAZINE (SILVADENE) 1 % cream Apply pea-sized amount to wound daily. 07/04/18   Evelina Bucy, DPM   Social History   Socioeconomic History  . Marital status: Married    Spouse name: Not on file  . Number of children: 2  . Years of education: 37  . Highest education level: Not on file  Occupational History  . Occupation: Tour manager  Tobacco Use  . Smoking status: Never Smoker  . Smokeless tobacco: Never Used  Vaping Use  . Vaping Use: Never used  Substance and Sexual Activity  . Alcohol use: No    Alcohol/week: 0.0 standard drinks    Comment: per pt 1 drink once in a long while  . Drug use: No  . Sexual activity: Yes    Partners: Female  Other Topics Concern  . Not on file  Social History Narrative   Married. Education: [employer].    Social Determinants of Health   Financial Resource Strain: Not on file  Food Insecurity: Not on file  Transportation Needs: Not on file  Physical Activity: Not on file  Stress: Not on file  Social Connections: Not on file  Intimate Partner Violence: Not on file    Review of Systems  Constitutional: Negative for fatigue and unexpected weight change.  Eyes: Negative for visual  disturbance.  Respiratory: Negative for cough, chest tightness and shortness of breath.   Cardiovascular: Negative for chest pain, palpitations and leg swelling.  Gastrointestinal: Negative for abdominal pain and blood in stool.  Neurological: Negative for dizziness, light-headedness and headaches.     Objective:   Vitals:   06/27/20 0852  BP: 131/80  Pulse: 72  Temp: 98 F (36.7 C)  TempSrc: Temporal  SpO2: 97%  Weight: (!) 309 lb (140.2 kg)  Height: 5' 9" (1.753 m)     Physical Exam Vitals reviewed.  Constitutional:      Appearance: He is well-developed and well-nourished.  HENT:     Head: Normocephalic and atraumatic.  Eyes:     Extraocular Movements: EOM normal.     Pupils: Pupils are equal, round, and reactive to light.  Neck:     Vascular: No carotid bruit or JVD.  Cardiovascular:     Rate and Rhythm: Normal rate and regular rhythm.     Heart sounds: Normal heart sounds. No murmur heard.   Pulmonary:     Effort: Pulmonary effort is normal.     Breath sounds: Normal breath sounds. No rales.  Musculoskeletal:        General: No edema.  Skin:    General: Skin is warm and dry.  Neurological:     Mental Status: He is alert and oriented to person, place, and time.  Psychiatric:        Mood and Affect: Mood and affect and mood normal.        Behavior: Behavior normal.        Thought Content: Thought content normal.    31 minutes spent during visit, greater than 50% counseling and assimilation of information, chart review, and discussion of plan.    Assessment & Plan:  PARRIS CUDWORTH is a 59 y.o. male . Hyperlipidemia, unspecified hyperlipidemia type - Plan: Lipid panel, Comprehensive metabolic panel, atorvastatin (LIPITOR) 20 MG tablet  Tolerating current regimen, continue same, check labs  Essential hypertension - Plan: Lipid panel, Comprehensive metabolic panel,  -Stable with lisinopril, continue  same.  Anticipate improvement with weight loss  Type  2 diabetes mellitus with foot ulcer, with long-term current use of insulin (HCC) - Plan: Hemoglobin A1c Microalbuminuria  -Unfortunately complicated by microalbuminuria, history of foot ulcer and toes imitation as above, obesity.  Under the care of endocrinology.  We will check A1c, then med adjustments to be determined by endocrinology.  Has been referred to [facility], and is motivated to see them for weight loss at this time, phone number provided again.  Continue ACE inhibitor for microalbuminuria.  Anxiety state - Plan: sertraline (ZOLOFT) 100 MG tablet Psychophysiological insomnia - Plan: hydrOXYzine (ATARAX/VISTARIL) 25 MG tablet Adjustment disorder with mixed anxiety and depressed mood - Plan: hydrOXYzine (ATARAX/VISTARIL) 25 MG tablet  -Anxiety stable with counseling and current medication.  Sleep also well controlled with hydroxyzine, continue same regimen, 75-monthfollow-up.  Meds ordered this encounter  Medications  . sertraline (ZOLOFT) 100 MG tablet    Sig: Take 1 tablet (100 mg total) by mouth daily.    Dispense:  90 tablet    Refill:  1  . atorvastatin (LIPITOR) 20 MG tablet    Sig: Take 1 tablet (20 mg total) by mouth daily.    Dispense:  90 tablet    Refill:  1  . lisinopril (ZESTRIL) 40 MG tablet    Sig: Take 1 tablet (40 mg total) by mouth daily.    Dispense:  90 tablet    Refill:  3  . hydrOXYzine (ATARAX/VISTARIL) 25 MG tablet    Sig: Take 0.5-1 tablets (12.5-25 mg total) by mouth at bedtime as needed for anxiety.    Dispense:  90 tablet    Refill:  1   Patient Instructions    Thanks for coming in today.  No medication changes for now.   I will check A1c, then can discuss any changes with your endocrinologist based on that reading.  I do recommend meeting with [facility], see below for number again if needed.  Follow-up me in 6 months.  Let me know if there are questions during that time.  Take care.   [facility]  Management  . [phone] If you have lab work done today you will be contacted with your lab results within the next 2 weeks.  If you have not heard from [place] please contact [place] The fastest way to get your results is to register for My Chart.   IF you received an x-ray today, you will receive an invoice from [facility]. Please contact [facility] at [phone] questions or concerns regarding your invoice.   IF you received labwork today, you will receive an invoice from [facility] Please contact [facility] at [phone] questions or concerns regarding your invoice.   Our billing staff will not be able to assist you with questions regarding bills from these companies.  You will be contacted with the lab results as soon as they are available. The fastest way to get your results is to activate your My Chart account. Instructions are located on the last page of this paperwork. If you have not heard from [place]arding the results in 2 weeks, please contact this office.         Signed, JMerri Ray MD [facility]

## 2020-06-27 NOTE — Patient Instructions (Addendum)
  Thanks for coming in today.  No medication changes for now.   I will check A1c, then can discuss any changes with your endocrinologist based on that reading.  I do recommend meeting with healthy [facility] wellness, see below for number again if needed.  Follow-up me in 6 months.  Let me know if there are questions during that time.  Take care.   Medical Weight Loss Management  . [phone]  If you have lab work done today you will be contacted with your lab results within the next 2 weeks.  If you have not heard from [place] then please contact us. The fastest way to get your results is to register for My Chart.   IF you received an x-ray today, you will receive an invoice from [facility]. Please contact [facility] at [phone] with questions or concerns regarding your invoice.   IF you received labwork today, you will receive an invoice from [facility]. Please contact [facility] at [phone] with questions or concerns regarding your invoice.   Our billing staff will not be able to assist you with questions regarding bills from these companies.  You will be contacted with the lab results as soon as they are available. The fastest way to get your results is to activate your My Chart account. Instructions are located on the last page of this paperwork. If you have not heard from [place] regarding the results in 2 weeks, please contact this office.

## 2020-06-28 LAB — COMPREHENSIVE METABOLIC PANEL
ALT: 24 IU/L (ref 0–44)
AST: 48 IU/L — ABNORMAL HIGH (ref 0–40)
Albumin/Globulin Ratio: 2 (ref 1.2–2.2)
Albumin: 4.7 g/dL (ref 3.8–4.9)
Alkaline Phosphatase: 74 IU/L (ref 44–121)
BUN/Creatinine Ratio: 12 (ref 10–24)
BUN: 12 mg/dL (ref 8–27)
Bilirubin Total: 0.3 mg/dL (ref 0.0–1.2)
CO2: 22 mmol/L (ref 20–29)
Calcium: 9.5 mg/dL (ref 8.6–10.2)
Chloride: 106 mmol/L (ref 96–106)
Creatinine, Ser: 1.04 mg/dL (ref 0.76–1.27)
GFR calc Af Amer: 90 mL/min/{1.73_m2} (ref >59)
GFR calc non Af Amer: 78 mL/min/{1.73_m2} (ref >59)
Globulin, Total: 2.4 g/dL (ref 1.5–4.5)
Glucose: 113 mg/dL — ABNORMAL HIGH (ref 65–99)
Potassium: 4.1 mmol/L (ref 3.5–5.2)
Sodium: 142 mmol/L (ref 134–144)
Total Protein: 7.1 g/dL (ref 6.0–8.5)

## 2020-06-28 LAB — LIPID PANEL
Chol/HDL Ratio: 3.2 ratio (ref 0.0–5.0)
Cholesterol, Total: 98 mg/dL — ABNORMAL LOW (ref 100–199)
HDL: 31 mg/dL — ABNORMAL LOW (ref >39)
LDL Chol Calc (NIH): 48 mg/dL (ref 0–99)
Triglycerides: 100 mg/dL (ref 0–149)
VLDL Cholesterol Cal: 19 mg/dL (ref 5–40)

## 2020-06-28 LAB — HEMOGLOBIN A1C
Est. average glucose Bld gHb Est-mCnc: 166 mg/dL
Hgb A1c MFr Bld: 7.4 % — ABNORMAL HIGH (ref 4.8–5.6)

## 2020-08-11 LAB — HM DIABETES EYE EXAM

## 2020-12-04 ENCOUNTER — Ambulatory Visit (INDEPENDENT_AMBULATORY_CARE_PROVIDER_SITE_OTHER): Payer: Managed Care, Other (non HMO) | Admitting: Family Medicine

## 2020-12-04 ENCOUNTER — Other Ambulatory Visit: Payer: Self-pay

## 2020-12-04 ENCOUNTER — Encounter (INDEPENDENT_AMBULATORY_CARE_PROVIDER_SITE_OTHER): Payer: Self-pay | Admitting: Family Medicine

## 2020-12-04 VITALS — BP 127/73 | HR 74 | Temp 98.1°F | Ht 68.0 in | Wt 302.0 lb

## 2020-12-04 DIAGNOSIS — E1159 Type 2 diabetes mellitus with other circulatory complications: Secondary | ICD-10-CM

## 2020-12-04 DIAGNOSIS — E785 Hyperlipidemia, unspecified: Secondary | ICD-10-CM

## 2020-12-04 DIAGNOSIS — Z0289 Encounter for other administrative examinations: Secondary | ICD-10-CM

## 2020-12-04 DIAGNOSIS — F3289 Other specified depressive episodes: Secondary | ICD-10-CM | POA: Diagnosis not present

## 2020-12-04 DIAGNOSIS — R5383 Other fatigue: Secondary | ICD-10-CM | POA: Diagnosis not present

## 2020-12-04 DIAGNOSIS — E1169 Type 2 diabetes mellitus with other specified complication: Secondary | ICD-10-CM

## 2020-12-04 DIAGNOSIS — Z6841 Body Mass Index (BMI) 40.0 and over, adult: Secondary | ICD-10-CM

## 2020-12-04 DIAGNOSIS — F32A Depression, unspecified: Secondary | ICD-10-CM | POA: Insufficient documentation

## 2020-12-04 DIAGNOSIS — R0602 Shortness of breath: Secondary | ICD-10-CM | POA: Diagnosis not present

## 2020-12-04 DIAGNOSIS — Z794 Long term (current) use of insulin: Secondary | ICD-10-CM

## 2020-12-04 DIAGNOSIS — I152 Hypertension secondary to endocrine disorders: Secondary | ICD-10-CM

## 2020-12-04 DIAGNOSIS — E559 Vitamin D deficiency, unspecified: Secondary | ICD-10-CM

## 2020-12-05 LAB — HEMOGLOBIN A1C
Est. average glucose Bld gHb Est-mCnc: 174 mg/dL
Hgb A1c MFr Bld: 7.7 % — ABNORMAL HIGH (ref 4.8–5.6)

## 2020-12-05 LAB — COMPREHENSIVE METABOLIC PANEL
ALT: 22 IU/L (ref 0–44)
AST: 46 IU/L — ABNORMAL HIGH (ref 0–40)
Albumin/Globulin Ratio: 2 (ref 1.2–2.2)
Albumin: 4.9 g/dL (ref 3.8–4.9)
Alkaline Phosphatase: 95 IU/L (ref 44–121)
BUN/Creatinine Ratio: 22 (ref 10–24)
BUN: 16 mg/dL (ref 8–27)
Bilirubin Total: 0.4 mg/dL (ref 0.0–1.2)
CO2: 19 mmol/L — ABNORMAL LOW (ref 20–29)
Calcium: 9.7 mg/dL (ref 8.6–10.2)
Chloride: 101 mmol/L (ref 96–106)
Creatinine, Ser: 0.73 mg/dL — ABNORMAL LOW (ref 0.76–1.27)
Globulin, Total: 2.5 g/dL (ref 1.5–4.5)
Glucose: 86 mg/dL (ref 65–99)
Potassium: 4.1 mmol/L (ref 3.5–5.2)
Sodium: 140 mmol/L (ref 134–144)
Total Protein: 7.4 g/dL (ref 6.0–8.5)
eGFR: 104 mL/min/{1.73_m2} (ref >59)

## 2020-12-05 LAB — CBC WITH DIFFERENTIAL/PLATELET
Basophils Absolute: 0.1 10*3/uL (ref 0.0–0.2)
Basos: 1 %
EOS (ABSOLUTE): 0.4 10*3/uL (ref 0.0–0.4)
Eos: 3 %
Hematocrit: 47.5 % (ref 37.5–51.0)
Hemoglobin: 15.5 g/dL (ref 13.0–17.7)
Immature Grans (Abs): 0 10*3/uL (ref 0.0–0.1)
Immature Granulocytes: 0 %
Lymphocytes Absolute: 2.6 10*3/uL (ref 0.7–3.1)
Lymphs: 23 %
MCH: 27.2 pg (ref 26.6–33.0)
MCHC: 32.6 g/dL (ref 31.5–35.7)
MCV: 84 fL (ref 79–97)
Monocytes Absolute: 0.8 10*3/uL (ref 0.1–0.9)
Monocytes: 7 %
Neutrophils Absolute: 7.4 10*3/uL — ABNORMAL HIGH (ref 1.4–7.0)
Neutrophils: 66 %
Platelets: 280 10*3/uL (ref 150–450)
RBC: 5.69 x10E6/uL (ref 4.14–5.80)
RDW: 13.8 % (ref 11.6–15.4)
WBC: 11.4 10*3/uL — ABNORMAL HIGH (ref 3.4–10.8)

## 2020-12-05 LAB — TSH: TSH: 1.55 u[IU]/mL (ref 0.450–4.500)

## 2020-12-05 LAB — VITAMIN B12: Vitamin B-12: 814 pg/mL (ref 232–1245)

## 2020-12-05 LAB — LIPID PANEL
Chol/HDL Ratio: 3.4 ratio (ref 0.0–5.0)
Cholesterol, Total: 103 mg/dL (ref 100–199)
HDL: 30 mg/dL — ABNORMAL LOW (ref >39)
LDL Chol Calc (NIH): 47 mg/dL (ref 0–99)
Triglycerides: 147 mg/dL (ref 0–149)
VLDL Cholesterol Cal: 26 mg/dL (ref 5–40)

## 2020-12-05 LAB — FOLATE: Folate: >20 ng/mL (ref >3.0)

## 2020-12-05 LAB — T4, FREE: Free T4: 1.24 ng/dL (ref 0.82–1.77)

## 2020-12-05 LAB — T3: T3, Total: 127 ng/dL (ref 71–180)

## 2020-12-05 LAB — VITAMIN D 25 HYDROXY (VIT D DEFICIENCY, FRACTURES): Vit D, 25-Hydroxy: 41.3 ng/mL (ref 30.0–100.0)

## 2020-12-05 LAB — INSULIN, RANDOM: INSULIN: 5.2 u[IU]/mL (ref 2.6–24.9)

## 2020-12-10 NOTE — Progress Notes (Signed)
Dear Dr. Percival Spanish,   Thank you for referring Brandon Rich to our clinic. The following note includes my evaluation and treatment recommendations.  Chief Complaint:   Brandon Rich (MR# [id number]) is a 60 y.o. male who presents for evaluation and treatment of obesity and related comorbidities. Current BMI is Body mass index is 45.92 kg/m. Brandon Rich has been struggling with his weight for many years and has been unsuccessful in either losing weight, maintaining weight loss, or reaching his healthy weight goal.  Brandon Rich is currently in the action stage of change and ready to dedicate time achieving and maintaining a healthier weight. Brandon Rich is interested in becoming our patient and working on intensive lifestyle modifications including (but not limited to) diet and exercise for weight loss.  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  He lives with his wife, [name].  Eats out 5 days per week or more.  Craves crispy, fired, sweets, and comfort food.  Snacks on cookies, chips, and crackers.  Not many vegetables/greens.  Going on vacation for 1 week tomorrow.  Will plan to start the week after.  [name]'s habits were reviewed today and are as follows: His family eats meals together, he thinks his family will eat healthier with him, his desired weight loss is 105 pounds, he has been heavy most of his life, he started gaining weight after college/when married, his heaviest weight ever was 320 pounds, he craves sweets, crispy food, fried food, comfort foods, he snacks frequently in the evenings, he is frequently drinking liquids with calories, he frequently makes poor food choices, he frequently eats larger portions than normal and he struggles with emotional eating.  Depression Screen Brandon Rich's Food and Mood (modified PHQ-9) score was 11.  Depression screen PHQ 2/9 12/04/2020  Decreased Interest 2  Down, Depressed, Hopeless 1  PHQ - 2 Score 3  Altered sleeping 1  Tired,  decreased energy 3  Change in appetite 1  Feeling bad or failure about yourself  1  Trouble concentrating 1  Moving slowly or fidgety/restless 1  Suicidal thoughts 0  PHQ-9 Score 11  Difficult doing work/chores Somewhat difficult  Some recent data might be hidden   Assessment/Plan:   Orders Placed This Encounter  Procedures  . Vitamin B12  . CBC with Differential/Platelet  . Comprehensive metabolic panel  . Folate  . Hemoglobin A1c  . Insulin, random  . Lipid panel  . T3  . T4, free  . TSH  . VITAMIN D 25 Hydroxy (Vit-D Deficiency, Fractures)  . EKG 12-Lead   1. Other fatigue Brandon Rich denies daytime somnolence and reports waking up still tired. Patent has a history of symptoms of morning fatigue and snoring. Brandon Rich generally gets 7 or 8 hours of sleep per night, and states that he has generally restful sleep. Snoring is present. Apneic episodes are present. Epworth Sleepiness Score is 2.  Brandon Rich has OSA and uses a CPAP machine.  Brandon Rich does feel that his weight is causing his energy to be lower than it should be. Fatigue may be related to obesity, depression or many other causes. Labs will be ordered, and in the meanwhile, Brandon Rich will focus on self care including making healthy food choices, increasing physical activity and focusing on stress reduction.  Check EKG and labs today.  - EKG 12-Lead - Vitamin B12 - CBC with Differential/Platelet - Comprehensive metabolic panel - Folate - T3 - T4, free - TSH  2. SOBOE (shortness of breath on  exertion) Brandon Rich notes increasing shortness of breath with exercising and seems to be worsening over time with weight gain. He notes getting out of breath sooner with activity than he used to. This has gotten worse recently. Brandon Rich denies shortness of breath at rest or orthopnea.  Brandon Rich does feel that he gets out of breath more easily that he used to when he exercises. Brandon Rich's shortness of breath appears to be obesity related and  exercise induced. He has agreed to work on weight loss and gradually increase exercise to treat his exercise induced shortness of breath. Will continue to monitor closely.  Check IC and labs today.  - Vitamin B12 - CBC with Differential/Platelet - Comprehensive metabolic panel - Folate - T3 - T4, free - TSH  3. Type 2 diabetes mellitus with other specified complication, with long-term current use of insulin (HCC) Diabetes Mellitus: Not at goal. Medication: Basaglar, Ozempic 1 mg subcutaneously weekly, Jardiance 25 mg daily, metformin 1,000 mg twice daily. Issues reviewed: blood sugar goals, complications of diabetes mellitus, hypoglycemia prevention and treatment, exercise, and nutrition.  Followed by Dr. Dagmar Hait at [facility].  FBS 110-150.  On 10/03/2020, A1c was 7.4.  Plan:  Will check labs today. The importance of regular follow up with PCP and all other specialists as scheduled was stressed to patient today. The patient will continue to focus on protein-rich, low simple carbohydrate foods. We reviewed the importance of hydration, regular exercise for stress reduction, and restorative sleep.   Lab Results  Component Value Date   HGBA1C 7.7 (H) 12/04/2020   HGBA1C 7.4 (H) 06/27/2020   HGBA1C 9.3 (H) 12/02/2016   Lab Results  Component Value Date   MICROALBUR 1.3 06/03/2016   LDLCALC 47 12/04/2020   CREATININE 0.73 (L) 12/04/2020   - Comprehensive metabolic panel - Hemoglobin A1c - Insulin, random  4. Hypertension associated with type 2 diabetes mellitus ([place]) At goal. Medications: lisinopril 40 mg daily.   Plan:  At goal.  Will check labs today.  Avoid buying foods that are: processed, frozen, or prepackaged to avoid excess salt. We will watch for signs of hypotension as he continues lifestyle modifications. We will continue to monitor closely alongside his PCP and/or Specialist.  Regular follow up with PCP and specialists was also encouraged.   BP Readings from Last 3 Encounters:   12/04/20 127/73  06/27/20 131/80  01/16/20 130/85   Lab Results  Component Value Date   CREATININE 0.73 (L) 12/04/2020   - Vitamin B12 - CBC with Differential/Platelet - Comprehensive metabolic panel - Folate - T3 - T4, free - TSH  5. Hyperlipidemia associated with type 2 diabetes mellitus ([place]) Course: Not at goal. Lipid-lowering medications: Lipitor 20 mg daily.   Plan:  Low HDL.  Dietary changes: Increase soluble fiber, decrease simple carbohydrates, decrease saturated fat. Exercise changes: Moderate to vigorous-intensity aerobic activity 150 minutes per week or as tolerated. We will continue to monitor along with PCP/specialists as it pertains to his weight loss journey.  Check labs today.  Lab Results  Component Value Date   CHOL 103 12/04/2020   HDL 30 (L) 12/04/2020   LDLCALC 47 12/04/2020   TRIG 147 12/04/2020   CHOLHDL 3.4 12/04/2020   Lab Results  Component Value Date   ALT 22 12/04/2020   AST 46 (H) 12/04/2020   ALKPHOS 95 12/04/2020   BILITOT 0.4 12/04/2020   - Lipid panel  6. Vitamin D deficiency Chet is taking 1,000 IU daily.  Optimal goal > 50  ng/dL.   Plan: Continue current OTC vitamin D supplementation.   Will check vitamin D level today, as per below.  - VITAMIN D 25 Hydroxy (Vit-D Deficiency, Fractures)  7. Other depression, with emotional eating With anxiety as well.  Medication: Zoloft 100 mg daily, hydroxyzine 12.5-25 mg as needed.  PHQ-9 is 11.  Symptoms are currently controlled.  Plan:  Will check labs today. Behavior modification techniques were discussed today to help deal with emotional/non-hunger eating behaviors.  8. Class 3 severe obesity with serious comorbidity and body mass index (BMI) of 45.0 to 49.9 in adult, unspecified obesity type [facility])  Reeves is currently in the action stage of change and his goal is to continue with weight loss efforts. I recommend Atanacio begin the structured treatment plan as follows:  He has  agreed to the Category 4 Plan.  Exercise goals: As is.   Behavioral modification strategies: increasing lean protein intake, decreasing simple carbohydrates and planning for success.  He was informed of the importance of frequent follow-up visits to maximize his success with intensive lifestyle modifications for his multiple health conditions. He was informed we would discuss his lab results at his next visit unless there is a critical issue that needs to be addressed sooner. Chia agreed to keep his next visit at the agreed upon time to discuss these results.  Objective:   Blood pressure 127/73, pulse 74, temperature 98.1 F (36.7 C), height 5\' 8"  (1.727 m), weight (!) 302 lb (137 kg), SpO2 97 %. Body mass index is 45.92 kg/m.  EKG: Normal sinus rhythm, rate 80 bpm.  Indirect Calorimeter completed today shows a VO2 of 419 and a REE of 2920.  His calculated basal metabolic rate is 7989 thus his basal metabolic rate is better than expected.  General: Cooperative, alert, well developed, in no acute distress. HEENT: Conjunctivae and lids unremarkable. Cardiovascular: Regular rhythm.  Lungs: Normal work of breathing. Neurologic: No focal deficits.   Lab Results  Component Value Date   CREATININE 0.73 (L) 12/04/2020   BUN 16 12/04/2020   NA 140 12/04/2020   K 4.1 12/04/2020   CL 101 12/04/2020   CO2 19 (L) 12/04/2020   Lab Results  Component Value Date   ALT 22 12/04/2020   AST 46 (H) 12/04/2020   ALKPHOS 95 12/04/2020   BILITOT 0.4 12/04/2020   Lab Results  Component Value Date   HGBA1C 7.7 (H) 12/04/2020   HGBA1C 7.4 (H) 06/27/2020   HGBA1C 9.3 (H) 12/02/2016   HGBA1C 8.5 (H) 06/03/2016   HGBA1C 8.9 (H) 01/30/2016   Lab Results  Component Value Date   INSULIN 5.2 12/04/2020   Lab Results  Component Value Date   TSH 1.550 12/04/2020   Lab Results  Component Value Date   CHOL 103 12/04/2020   HDL 30 (L) 12/04/2020   LDLCALC 47 12/04/2020   TRIG 147 12/04/2020    CHOLHDL 3.4 12/04/2020   Lab Results  Component Value Date   WBC 11.4 (H) 12/04/2020   HGB 15.5 12/04/2020   HCT 47.5 12/04/2020   MCV 84 12/04/2020   PLT 280 12/04/2020   Attestation Statements:   This is the patient's first visit at [facility]. The patient's NEW PATIENT PACKET was reviewed at length. Included in the packet: current and past health history, medications, allergies, ROS, gynecologic history (women only), surgical history, family history, social history, weight history, weight loss surgery history (for those that have had weight loss surgery), nutritional evaluation, mood  and food questionnaire, PHQ9, Epworth questionnaire, sleep habits questionnaire, patient life and health improvement goals questionnaire. These will all be scanned into the patient's chart under media.   During the visit, I independently reviewed the patient's EKG, bioimpedance scale results, and indirect calorimeter results. I used this information to tailor a meal plan for the patient that will help him to lose weight and will improve his obesity-related conditions going forward. I performed a medically necessary appropriate examination and/or evaluation. I discussed the assessment and treatment plan with the patient. The patient was provided an opportunity to ask questions and all were answered. The patient agreed with the plan and demonstrated an understanding of the instructions. Labs were ordered at this visit and will be reviewed at the next visit unless more critical results need to be addressed immediately. Clinical information was updated and documented in the EMR.   Time spent on visit including pre-visit chart review and post-visit charting and care was >65 minutes.   I, Water quality scientist, CMA, am acting as Location manager for [employer], DO.  I have reviewed the above documentation for accuracy and completeness, and I agree with the above. Marjory Sneddon, D.O.  The [place] was signed into law in 2016 which includes the topic of electronic health records.  This provides immediate access to information in MyChart.  This includes consultation notes, operative notes, office notes, lab results and pathology reports.  If you have any questions about what you read please let us know at your next visit so we can discuss your concerns and take corrective action if need be.  We are right here with you.

## 2020-12-18 ENCOUNTER — Other Ambulatory Visit: Payer: Self-pay

## 2020-12-18 ENCOUNTER — Encounter (INDEPENDENT_AMBULATORY_CARE_PROVIDER_SITE_OTHER): Payer: Self-pay | Admitting: Family Medicine

## 2020-12-18 ENCOUNTER — Ambulatory Visit (INDEPENDENT_AMBULATORY_CARE_PROVIDER_SITE_OTHER): Payer: Managed Care, Other (non HMO) | Admitting: Family Medicine

## 2020-12-18 VITALS — BP 125/68 | HR 82 | Temp 98.0°F | Ht 68.0 in | Wt 302.0 lb

## 2020-12-18 DIAGNOSIS — Z794 Long term (current) use of insulin: Secondary | ICD-10-CM

## 2020-12-18 DIAGNOSIS — E785 Hyperlipidemia, unspecified: Secondary | ICD-10-CM

## 2020-12-18 DIAGNOSIS — I152 Hypertension secondary to endocrine disorders: Secondary | ICD-10-CM

## 2020-12-18 DIAGNOSIS — K760 Fatty (change of) liver, not elsewhere classified: Secondary | ICD-10-CM | POA: Diagnosis not present

## 2020-12-18 DIAGNOSIS — Z6841 Body Mass Index (BMI) 40.0 and over, adult: Secondary | ICD-10-CM

## 2020-12-18 DIAGNOSIS — E1159 Type 2 diabetes mellitus with other circulatory complications: Secondary | ICD-10-CM

## 2020-12-18 DIAGNOSIS — E66813 Obesity, class 3: Secondary | ICD-10-CM

## 2020-12-18 DIAGNOSIS — E1169 Type 2 diabetes mellitus with other specified complication: Secondary | ICD-10-CM

## 2020-12-18 DIAGNOSIS — E559 Vitamin D deficiency, unspecified: Secondary | ICD-10-CM | POA: Diagnosis not present

## 2020-12-18 MED ORDER — VITAMIN D (ERGOCALCIFEROL) 1.25 MG (50000 UNIT) PO CAPS: 50000.0000 [IU] | ORAL_CAPSULE | ORAL | 0 refills | Status: DC

## 2020-12-22 DIAGNOSIS — K760 Fatty (change of) liver, not elsewhere classified: Secondary | ICD-10-CM | POA: Insufficient documentation

## 2020-12-26 ENCOUNTER — Encounter: Payer: Self-pay | Admitting: Family Medicine

## 2020-12-26 ENCOUNTER — Ambulatory Visit: Payer: Managed Care, Other (non HMO) | Admitting: Family Medicine

## 2020-12-26 ENCOUNTER — Other Ambulatory Visit: Payer: Self-pay

## 2020-12-26 DIAGNOSIS — Z794 Long term (current) use of insulin: Secondary | ICD-10-CM

## 2020-12-26 DIAGNOSIS — F4323 Adjustment disorder with mixed anxiety and depressed mood: Secondary | ICD-10-CM

## 2020-12-26 DIAGNOSIS — F5104 Psychophysiologic insomnia: Secondary | ICD-10-CM

## 2020-12-26 DIAGNOSIS — I10 Essential (primary) hypertension: Secondary | ICD-10-CM | POA: Diagnosis not present

## 2020-12-26 DIAGNOSIS — E785 Hyperlipidemia, unspecified: Secondary | ICD-10-CM | POA: Diagnosis not present

## 2020-12-26 DIAGNOSIS — F411 Generalized anxiety disorder: Secondary | ICD-10-CM

## 2020-12-26 DIAGNOSIS — E119 Type 2 diabetes mellitus without complications: Secondary | ICD-10-CM

## 2020-12-26 MED ORDER — SERTRALINE HCL 100 MG PO TABS: 100.0000 mg | ORAL_TABLET | Freq: Every day | ORAL | 1 refills | Status: DC

## 2020-12-26 MED ORDER — ATORVASTATIN CALCIUM 20 MG PO TABS: 20.0000 mg | ORAL_TABLET | Freq: Every day | ORAL | 1 refills | Status: DC

## 2020-12-26 NOTE — Patient Instructions (Signed)
I do recommend continuing aspirin for now, but can discuss with cardiology as well.   No med changes at this time.

## 2020-12-26 NOTE — Progress Notes (Signed)
Subjective:  Patient ID: Brandon Rich, male    DOB: March 23, 1961  Age: 60 y.o. MRN: [id number]  CC:  Chief Complaint  Patient presents with   Hyperlipidemia    Pt in need of refill atorvastatin    Hypertension    Pt denies physical sxs, BP in range in office today no need for refill lisinopril   Diabetes    Pt due for refill to metformin   Medication Management    Pt does wonder if he should continue low dose aspirin,     HPI Brandon Rich presents for   Hyperlipidemia: Lipitor 20 mg daily. No new myalgias.  Lab Results  Component Value Date   CHOL 103 12/04/2020   HDL 30 (L) 12/04/2020   LDLCALC 47 12/04/2020   TRIG 147 12/04/2020   CHOLHDL 3.4 12/04/2020   Lab Results  Component Value Date   ALT 22 12/04/2020   AST 46 (H) 12/04/2020   ALKPHOS 95 12/04/2020   BILITOT 0.4 12/04/2020   Hypertension: Lisinopril 40 mg daily.  No new cough or side effects.  Home readings: none BP Readings from Last 3 Encounters:  12/26/20 124/72  12/18/20 125/68  12/04/20 127/73   Lab Results  Component Value Date   CREATININE 0.73 (L) 12/04/2020   Diabetes: Followed by endocrinology, Dr. Buddy Duty.  Complicated by diabetic polyneuropathy, microalbuminuria, previous to amputations.  Associated with obesity.   Treated with Jardiance, Levemir, metformin.  Needs metformin refill.  He is also been under the care of Dr. Raliegh Scarlet with healthy weight and wellness. Doing well - lost 11 pounds. Blood sugar down, and happy with progress.  Recent A1c in May. He is on ACE inhibitor and statin as above. Currently taking aspirin, recommend he continue given multiple cardiac risk factors but can also discuss with his cardiologist. Denies hx PUD, bleeding or falls.   Microalbumin/creatinine ratio 56 on 12/28/2019 Recommended second booster for COVID-19 vaccine but otherwise health maintenance up-to-date. Lab Results  Component Value Date   HGBA1C 7.7 (H) 12/04/2020   HGBA1C 7.4 (H)  06/27/2020   HGBA1C 9.3 (H) 12/02/2016   Lab Results  Component Value Date   MICROALBUR 1.3 06/03/2016   LDLCALC 47 12/04/2020   CREATININE 0.73 (L) 12/04/2020    Depression: Feels better on sertraline without new side effects - would like ot continue. Still meeting with counseling 1-2x/month. Heather Mccain.    Depression screen [facility] 2/9 12/26/2020 12/04/2020 06/27/2020 12/28/2019 06/29/2019  Decreased Interest 0 2 0 0 -  Down, Depressed, Hopeless 0 1 0 0 -  PHQ - 2 Score 0 3 0 0 -  Altered sleeping 1 1 - - 0  Tired, decreased energy 0 3 - - 0  Change in appetite 1 1 - - 0  Feeling bad or failure about yourself  0 1 - - 0  Trouble concentrating 0 1 - - 0  Moving slowly or fidgety/restless 0 1 - - 0  Suicidal thoughts 0 0 - - 0  PHQ-9 Score 2 11 - - -  Difficult doing work/chores - Somewhat difficult - - -  Some recent data might be hidden     History Patient Active Problem List   Diagnosis Date Noted   Nonalcoholic hepatosteatosis 16/07/930   Other fatigue 12/04/2020   SOBOE (shortness of breath on exertion) 12/04/2020   Vitamin D deficiency 12/04/2020   Depression 12/04/2020   Chest discomfort 02/02/2019   Morbid obesity ([place]) 02/02/2019   Pain in right hand  02/15/2018   Trigger finger 02/15/2018   Cortical age-related cataract of both eyes 03/30/2017   Nuclear sclerotic cataract of both eyes 03/30/2017   Primary open angle glaucoma (POAG) of both eyes, indeterminate stage 03/30/2017   Type 2 diabetes mellitus without complication, without long-term current use of insulin ([place]) 03/30/2017   LVH (left ventricular hypertrophy) 03/29/2016   FUO (fever of unknown origin) 02/16/2016   Acute combined systolic and diastolic heart failure ([place]) 02/16/2016   Weight gain 02/16/2016   Weight loss 02/16/2016   Rash and nonspecific skin eruption 02/16/2016   DOE (dyspnea on exertion) 02/16/2016   T wave inversion in EKG 02/16/2016   Transaminitis 02/16/2016   Arthritis of big  toe 08/23/2013   Pain of left great toe 08/23/2013   Hypertension associated with type 2 diabetes mellitus ([place]) 12/20/2011   Diabetes mellitus ([place]) 12/20/2011   Hyperlipidemia associated with type 2 diabetes mellitus ([place]) 12/20/2011   Glaucoma (increased eye pressure) 07/19/1996   Past Medical History:  Diagnosis Date   Acute combined systolic and diastolic heart failure ([place]) 02/16/2016   Allergy    Anxiety    Phreesia 12/27/2019   Anxiety    DDD (degenerative disc disease), cervical    Depression    Diabetes mellitus    Diabetes mellitus without complication ([place])    Phreesia 12/27/2019   Diabetic foot ulcer (HCC)    FUO (fever of unknown origin) 02/16/2016   Glaucoma    Heartburn    Hyperlipidemia    Hypertension    LVH (left ventricular hypertrophy) 03/29/2016   Palpitation    Rash and nonspecific skin eruption 02/16/2016   Sleep apnea    CPAP   SOBOE (shortness of breath on exertion)    Transaminitis 02/16/2016   Past Surgical History:  Procedure Laterality Date   AMPUTATION TOE  04/2020   AMPUTATION TOE  01/2020   HERNIA REPAIR     SPINE SURGERY     L4/L5   TUMOR REMOVAL  12/1998   Bone Cyst   Allergies  Allergen Reactions   Other Other (See Comments)    Hayfever   Prior to Admission medications   Medication Sig Start Date End Date Taking? Authorizing Provider  Ascorbic Acid (VITAMIN C) 1000 MG tablet Take 1,000 mg by mouth daily.   Yes [provider]  aspirin 81 MG tablet Take 81 mg by mouth daily.   Yes [provider]  atorvastatin (LIPITOR) 20 MG tablet Take 1 tablet (20 mg total) by mouth daily. 06/27/20  Yes [name], MD  BD PEN NEEDLE NANO U/F 32G X 4 MM MISC  07/04/18  Yes [provider]  calcium carbonate (TUMS - DOSED IN MG ELEMENTAL CALCIUM) 500 MG chewable tablet Chew 2 tablets by mouth daily as needed for indigestion or heartburn.   Yes [provider]  empagliflozin (JARDIANCE) 25 MG TABS tablet  Take 25 mg by mouth daily. 12/16/16  Yes [name], MD  fexofenadine (ALLEGRA) 180 MG tablet Take 180 mg by mouth daily.   Yes [provider]  fluticasone (FLONASE) 50 MCG/ACT nasal spray PLACE 2 SPRAYS INTO BOTH NOSTRILS DAILY. 07/09/14  Yes GuestBenn Moulder, MD  hydrOXYzine (ATARAX/VISTARIL) 25 MG tablet Take 0.5-1 tablets (12.5-25 mg total) by mouth at bedtime as needed for anxiety. 06/27/20  Yes [name], MD  Insulin Detemir (LEVEMIR FLEXPEN [place]) Inject 80 Units into the skin daily.   Yes [provider]  lisinopril (ZESTRIL) 40  MG tablet Take 1 tablet (40 mg total) by mouth daily. 06/27/20  Yes [name], MD  metFORMIN (GLUCOPHAGE) 1000 MG tablet TAKE ONE TABLET BY MOUTH TWICE A DAY WITH A MEAL 12/18/19  Yes [name], MD  Multiple Vitamin (MULTIVITAMIN) tablet Take 1 tablet by mouth every evening.    Yes [provider]  Semaglutide,0.25 or 0.5MG /DOS, 2 MG/1.5ML SOPN Inject 1 mg into the skin once a week.   Yes [provider]  sertraline (ZOLOFT) 100 MG tablet Take 1 tablet (100 mg total) by mouth daily. 06/27/20  Yes [name], MD  Vitamin D, Ergocalciferol, (DRISDOL) 1.25 MG (50000 UNIT) CAPS capsule Take 1 capsule (50,000 Units total) by mouth every 7 (seven) days. 12/18/20  Yes Opalski, Neoma Laming, DO  Cholecalciferol 25 MCG (1000 UT) tablet Take by mouth. Patient not taking: Reported on 12/26/2020    [provider]   Social History   Socioeconomic History   Marital status: Married    Spouse name: Brandon Rich   Number of children: 2   Years of education: 16   Highest education level: Not on file  Occupational History   Occupation: Tour manager  Tobacco Use   Smoking status: Never   Smokeless tobacco: Never  Vaping Use   Vaping Use: Never used  Substance and Sexual Activity   Alcohol use: No    Alcohol/week: 0.0 standard drinks    Comment: per pt 1 drink once in a long while   Drug use: No    Sexual activity: Yes    Partners: Female  Other Topics Concern   Not on file  Social History Narrative   Married. Education: [employer].    Social Determinants of Health   Financial Resource Strain: Not on file  Food Insecurity: Not on file  Transportation Needs: Not on file  Physical Activity: Not on file  Stress: Not on file  Social Connections: Not on file  Intimate Partner Violence: Not on file    Review of Systems   Objective:   Vitals:   12/26/20 0836  BP: 124/72  Pulse: 87  Resp: 17  Temp: 98 F (36.7 C)  TempSrc: Temporal  SpO2: 96%  Weight: 296 lb 9.6 oz (134.5 kg)  Height: 5\' 8"  (1.727 m)     Physical Exam Vitals reviewed.  Constitutional:      Appearance: He is well-developed. He is obese.  HENT:     Head: Normocephalic and atraumatic.  Neck:     Vascular: No carotid bruit or JVD.  Cardiovascular:     Rate and Rhythm: Normal rate and regular rhythm.     Heart sounds: Normal heart sounds. No murmur heard. Pulmonary:     Effort: Pulmonary effort is normal.     Breath sounds: Normal breath sounds. No rales.  Musculoskeletal:     Right lower leg: No edema.     Left lower leg: No edema.  Skin:    General: Skin is warm and dry.  Neurological:     Mental Status: He is alert and oriented to person, place, and time.  Psychiatric:        Mood and Affect: Mood normal.        Behavior: Behavior normal.        Thought Content: Thought content normal.        Judgment: Judgment normal.       Assessment & Plan:  DUILIO HERITAGE is a 60 y.o. male . Hyperlipidemia, unspecified hyperlipidemia  type  - tolerating current regimen and utd on labs. Recheck in 72months.  Anticipate improvement with weight loss.  Commended on his weight loss.  Continue work with healthy weight and wellness, appreciate their care.  Essential hypertension  -Stable, continue same regimen.  Up-to-date on labs.  Aspirin use discussed including potential risks and benefits.  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  62-month follow-up for physical  Type 2 diabetes mellitus without complication, with long-term current use of insulin (HCC)  -Recent A1c noted, continue follow-up with endocrinology.  I expect his A1c to continue to improve as weight improves.  No orders of the defined types were placed in this encounter.  There are no Patient Instructions on file for this visit.    Signed,   [name], MD [place] 12/26/20 8:57 AM

## 2020-12-30 ENCOUNTER — Other Ambulatory Visit: Payer: Self-pay

## 2020-12-30 ENCOUNTER — Ambulatory Visit (INDEPENDENT_AMBULATORY_CARE_PROVIDER_SITE_OTHER): Payer: Managed Care, Other (non HMO) | Admitting: Family Medicine

## 2020-12-30 ENCOUNTER — Encounter (INDEPENDENT_AMBULATORY_CARE_PROVIDER_SITE_OTHER): Payer: Self-pay | Admitting: Family Medicine

## 2020-12-30 VITALS — BP 122/76 | HR 72 | Temp 98.5°F | Ht 68.0 in | Wt 287.0 lb

## 2020-12-30 DIAGNOSIS — E559 Vitamin D deficiency, unspecified: Secondary | ICD-10-CM

## 2020-12-30 DIAGNOSIS — Z9189 Other specified personal risk factors, not elsewhere classified: Secondary | ICD-10-CM

## 2020-12-30 DIAGNOSIS — E1169 Type 2 diabetes mellitus with other specified complication: Secondary | ICD-10-CM

## 2020-12-30 DIAGNOSIS — Z794 Long term (current) use of insulin: Secondary | ICD-10-CM

## 2020-12-30 DIAGNOSIS — Z6841 Body Mass Index (BMI) 40.0 and over, adult: Secondary | ICD-10-CM

## 2020-12-30 MED ORDER — VITAMIN D (ERGOCALCIFEROL) 1.25 MG (50000 UNIT) PO CAPS: 50000.0000 [IU] | ORAL_CAPSULE | ORAL | 0 refills | Status: DC

## 2020-12-30 NOTE — Progress Notes (Signed)
Chief Complaint:   OBESITY Brandon Rich is here to discuss his progress with his obesity treatment plan along with follow-up of his obesity related diagnoses.   Today's visit was #: 2 Starting weight: 302 lbs Starting date: 12/04/2020 Today's weight: 302 lbs Today's date: 12/18/2020 Weight change since last visit: 0 Total lbs lost to date: 0 Body mass index is 45.92 kg/m.   Interim History:  Brandon Rich is here today for his first follow-up office visit since starting the program with [place].  All blood work/ lab tests that were recently ordered by myself or an outside provider were reviewed with patient today per their request.   Extended time was spent counseling him on all new disease processes that were discovered or preexisting ones that are worsening.  he understands that many of these abnormalities will need to monitored regularly along with the current treatment plan of prudent dietary changes, in which we are making each and every office visit, to improve these health parameters.  We reviewed his new meal plan in detail and questions were answered.  Patient's food recall appears to be accurate and consistent with what is on plan when he is following it.   When eating on plan, his hunger and cravings are well controlled.    Brandon Rich just got back from a 9 day vacation at [facility].  He started on plan this past Monday (3 days ago).  Current Meal Plan: the Category 4 Plan for 50% of the time.  Current Exercise Plan: None. Current Anti-Obesity Medications: Ozempic 1 mg subcutaneously weekly. Side effects: None.  Assessment/Plan:   Medications Discontinued During This Encounter  Medication Reason   Insulin Glargine (BASAGLAR KWIKPEN) 100 UNIT/ML SOPN    Meds ordered this encounter  Medications   Vitamin D, Ergocalciferol, (DRISDOL) 1.25 MG (50000 UNIT) CAPS capsule    Sig: Take 1 capsule (50,000 Units total) by mouth every 7 (seven) days.    Dispense:  4 capsule     Refill:  0    Ov for rf   1. Type 2 diabetes mellitus with other specified complication, with long-term current use of insulin (HCC) Diabetes Mellitus: Not at goal. Medication: Jardiance 25 mg daily, Levemir, metformin 1,000 mg twice daily, Ozempic 1 mg subcutaneously weekly. Issues reviewed: blood sugar goals, complications of diabetes mellitus, hypoglycemia prevention and treatment, exercise, and nutrition.  Average blood sugar is 124, which is 30 pounts down from his normal.  FBS 103 this morning.  Plan:  Worsening.  Discussed labs with patient today.  Brandon Rich understands to decrease insulin by 10 units every couple days to obtain goal blood sugars as directed by his endocrinologist.  Hypoglycemia management discussed with patient just in case. The importance of regular follow up with PCP and all other specialists as scheduled was stressed to patient today. The patient will continue to focus on protein-rich, low simple carbohydrate foods. We reviewed the importance of hydration, regular exercise for stress reduction, and restorative sleep.   Lab Results  Component Value Date   HGBA1C 7.7 (H) 12/04/2020   HGBA1C 7.4 (H) 06/27/2020   HGBA1C 9.3 (H) 12/02/2016   Lab Results  Component Value Date   MICROALBUR 1.3 06/03/2016   LDLCALC 47 12/04/2020   CREATININE 0.73 (L) 12/04/2020   2. Hyperlipidemia associated with type 2 diabetes mellitus ([place]) Course: Not at goal. Lipid-lowering medications: Lipitor 20 mg daily.    Plan:  Discussed labs with patient today.  Low HDL.  LDL  at goal.  Continue Lipitor, but eventually increase exercise along with PNP.  Dietary changes: Increase soluble fiber, decrease simple carbohydrates, decrease saturated fat. Exercise changes: Moderate to vigorous-intensity aerobic activity 150 minutes per week or as tolerated. We will continue to monitor along with PCP/specialists as it pertains to his weight loss journey.  Lab Results  Component Value Date   CHOL 103  12/04/2020   HDL 30 (L) 12/04/2020   LDLCALC 47 12/04/2020   TRIG 147 12/04/2020   CHOLHDL 3.4 12/04/2020   Lab Results  Component Value Date   ALT 22 12/04/2020   AST 46 (H) 12/04/2020   ALKPHOS 95 12/04/2020   BILITOT 0.4 12/04/2020   3. Hypertension associated with type 2 diabetes mellitus ([place]) At goal. Medications: lisinopril 40 mg daily.    Plan:  At goal.  Discussed labs with patient today. Avoid buying foods that are: processed, frozen, or prepackaged to avoid excess salt. We will watch for signs of hypotension as he continues lifestyle modifications.  BP Readings from Last 3 Encounters:  12/26/20 124/72  12/18/20 125/68  12/04/20 127/73   Lab Results  Component Value Date   CREATININE 0.73 (L) 12/04/2020   4. Nonalcoholic hepatosteatosis Discussed labs with patient today.  Liver enzymes stable from prior, but slightly elevated.  Continue Ozempic.  Continue prudent nutritional plan and weight loss.  NAFLD is an umbrella term that encompasses a disease spectrum that includes steatosis (fat) without inflammation, steatohepatitis (NASH; fat + inflammation in a characteristic pattern), and cirrhosis. Bland steatosis is felt to be a benign condition, with extremely low to no risk of progression to cirrhosis, whereas NASH can progress to cirrhosis. The mainstay of treatment of NAFLD includes lifestyle modification to achieve weight loss, at least 7% of current body weight. Low carbohydrate diets can be beneficial in improving NAFLD liver histology. Additionally, exercise, even the absence of weight loss can have beneficial effects on the patient's metabolic profile and liver health.   5. Vitamin D deficiency Improving, but not optimized. Current vitamin D is 41.3, tested on 12/04/2020. Optimal goal > 50 ng/dL.    New.  Discussed labs with patient today. Discontinue OTC vitamin D.    Plan: - Discussed importance of vitamin D to their health and well-being.  - possible symptoms  of low Vitamin D can be low energy, depressed mood, muscle aches, joint aches, osteoporosis etc. - low Vitamin D levels may be linked to an increased risk of cardiovascular events and even increased risk of cancers- such as colon and breast.  - I recommend pt take weekly prescription vit D - see script below   - Informed patient this may be a lifelong thing, and he was encouraged to continue to take the medicine until told otherwise.   - we will need to monitor levels regularly (every 3-4 mo on average) to keep levels within normal limits.  - weight loss will likely improve availability of vitamin D, thus encouraged Brandon Rich to continue with meal plan and their weight loss efforts to further improve this condition - pt's questions and concerns regarding this condition addressed.  - Start Vitamin D, Ergocalciferol, (DRISDOL) 1.25 MG (50000 UNIT) CAPS capsule; Take 1 capsule (50,000 Units total) by mouth every 7 (seven) days.  Dispense: 4 capsule; Refill: 0  6. Obesity, current BMI 46.0  Course: Brandon Rich is currently in the action stage of change. As such, his goal is to continue with weight loss efforts.   Nutrition goals: He has agreed to  the Category 4 Plan with breakfast options.   Exercise goals:  As is.  Behavioral modification strategies: no skipping meals, meal planning and cooking strategies, better snacking choices, and planning for success.  Brandon Rich has agreed to follow-up with our clinic in 2-3 weeks. He was informed of the importance of frequent follow-up visits to maximize his success with intensive lifestyle modifications for his multiple health conditions.   Objective:   Blood pressure 125/68, pulse 82, temperature 98 F (36.7 C), height 5\' 8"  (1.727 m), weight (!) 302 lb (137 kg), SpO2 95 %. Body mass index is 45.92 kg/m.  General: Cooperative, alert, well developed, in no acute distress. HEENT: Conjunctivae and lids unremarkable. Cardiovascular: Regular rhythm.  Lungs:  Normal work of breathing. Neurologic: No focal deficits.   Lab Results  Component Value Date   CREATININE 0.73 (L) 12/04/2020   BUN 16 12/04/2020   NA 140 12/04/2020   K 4.1 12/04/2020   CL 101 12/04/2020   CO2 19 (L) 12/04/2020   Lab Results  Component Value Date   ALT 22 12/04/2020   AST 46 (H) 12/04/2020   ALKPHOS 95 12/04/2020   BILITOT 0.4 12/04/2020   Lab Results  Component Value Date   HGBA1C 7.7 (H) 12/04/2020   HGBA1C 7.4 (H) 06/27/2020   HGBA1C 9.3 (H) 12/02/2016   HGBA1C 8.5 (H) 06/03/2016   HGBA1C 8.9 (H) 01/30/2016   Lab Results  Component Value Date   INSULIN 5.2 12/04/2020   Lab Results  Component Value Date   TSH 1.550 12/04/2020   Lab Results  Component Value Date   CHOL 103 12/04/2020   HDL 30 (L) 12/04/2020   LDLCALC 47 12/04/2020   TRIG 147 12/04/2020   CHOLHDL 3.4 12/04/2020   Lab Results  Component Value Date   WBC 11.4 (H) 12/04/2020   HGB 15.5 12/04/2020   HCT 47.5 12/04/2020   MCV 84 12/04/2020   PLT 280 12/04/2020   Attestation Statements:   Reviewed by clinician on day of visit: allergies, medications, problem list, medical history, surgical history, family history, social history, and previous encounter notes.  Time spent on visit including pre-visit chart review and post-visit care and charting was 40 minutes.   I, Water quality scientist, CMA, am acting as Location manager for [employer], DO.  I have reviewed the above documentation for accuracy and completeness, and I agree with the above. Marjory Sneddon, D.O.  The [place] was signed into law in 2016 which includes the topic of electronic health records.  This provides immediate access to information in MyChart.  This includes consultation notes, operative notes, office notes, lab results and pathology reports.  If you have any questions about what you read please let us know at your next visit so we can discuss your concerns and take corrective action if need  be.  We are right here with you.

## 2021-01-06 NOTE — Progress Notes (Signed)
Chief Complaint:   OBESITY Brandon Rich is here to discuss his progress with his obesity treatment plan along with follow-up of his obesity related diagnoses.   Today's visit was #: 3 Starting weight: 302 lbs Starting date: 12/04/2020 Today's weight: 287 lbs Today's date: 12/30/2020 Weight change since last visit: 15 lbs Total lbs lost to date: 15 lbs Body mass index is 43.64 kg/m.  Total weight loss percentage to date: -4.97%  Interim History: Brandon Rich is here for a follow up office visit and he is following the meal plan without concerns or issues.  Patient's meal and food recall appears to be accurate and consistent with what is on the plan.  When on plan, his hunger and cravings are well controlled.    Brandon Rich was at [facility] over the weekend.  He made great choices when eating out.  Stuck to the plan.  Current Meal Plan: the Category 4 Plan for 95% of the time.  Current Exercise Plan: None. Current Anti-Obesity Medications: Ozempic 1 mg subcutaneously weekly. Side effects: None.  Assessment/Plan:   Medications Discontinued During This Encounter  Medication Reason   Vitamin D, Ergocalciferol, (DRISDOL) 1.25 MG (50000 UNIT) CAPS capsule Reorder   Meds ordered this encounter  Medications   Vitamin D, Ergocalciferol, (DRISDOL) 1.25 MG (50000 UNIT) CAPS capsule    Sig: Take 1 capsule (50,000 Units total) by mouth every 7 (seven) days.    Dispense:  4 capsule    Refill:  0    Ov for rf    1. Vitamin D deficiency Not at goal. Current vitamin D is 41.3, tested on 12/04/2020. Optimal goal > 50 ng/dL.  Brandon Rich is taking vitamin D 50,000 IU weekly.  Started at last office visit.  Tolerating well.  Plan: Continue to take prescription Vitamin D @50 ,000 IU every week as prescribed.  Follow-up for routine testing of Vitamin D, at least 2-3 times per year to avoid over-replacement.  - Refill Vitamin D, Ergocalciferol, (DRISDOL) 1.25 MG (50000 UNIT) CAPS capsule; Take 1  capsule (50,000 Units total) by mouth every 7 (seven) days.  Dispense: 4 capsule; Refill: 0  2. Type 2 diabetes mellitus with other specified complication, with long-term current use of insulin (HCC) Diabetes Mellitus: Not at goal. Medication: metformin 1,000 mg twice daily and Ozempic 1 mg subcutaneously weekly, Jardiance 25 mg daily, and Levemir. Issues reviewed: blood sugar goals, complications of diabetes mellitus, hypoglycemia prevention and treatment, exercise, and nutrition.  No issues or concerns with blood sugar today.  No low blood sugars.  Followed by Dr. Buddy Duty of Endocrinology.  He understands how to wean insulin with continued weight loss.  Plan:  continue prudent nutritional plan and weight loss.  Continue to work closely with Endocrinology office visits, weaning insulin dose as appropriate.  Continue Ozempic.  The importance of regular follow up with PCP and all other specialists as scheduled was stressed to patient today. The patient will continue to focus on protein-rich, low simple carbohydrate foods. We reviewed the importance of hydration, regular exercise for stress reduction, and restorative sleep.   Lab Results  Component Value Date   HGBA1C 7.7 (H) 12/04/2020   HGBA1C 7.4 (H) 06/27/2020   HGBA1C 9.3 (H) 12/02/2016   Lab Results  Component Value Date   MICROALBUR 1.3 06/03/2016   LDLCALC 47 12/04/2020   CREATININE 0.73 (L) 12/04/2020   3. At risk for hypoglycemia Brandon Rich was given approximately 9 minutes of counseling today regarding prevention of hypoglycemia.  He was advised of symptoms of hypoglycemia.  Brandon Rich was instructed to avoid skipping meals and to eat regular protein-rich meals as prescribed on the meal plan.  The patient should have readily available low calorie snacks as needed, such as [employer] fruit snack packs, if blood sugar goes too low.    4. Class 3 severe obesity with serious comorbidity and body mass index (BMI) of 45.0 to 49.9 in adult, unspecified  obesity type [facility])  Course: Brandon Rich is currently in the action stage of change. As such, his goal is to continue with weight loss efforts.   Nutrition goals: He has agreed to the Category 4 Plan.   Exercise goals:  As is for now.  Behavioral modification strategies: meal planning and cooking strategies, keeping healthy foods in the home, and planning for success.  Brandon Rich has agreed to follow-up with our clinic in 2-3 weeks. He was informed of the importance of frequent follow-up visits to maximize his success with intensive lifestyle modifications for his multiple health conditions.   Objective:   Blood pressure 122/76, pulse 72, temperature 98.5 F (36.9 C), height 5\' 8"  (1.727 m), weight 287 lb (130.2 kg), SpO2 97 %. Body mass index is 43.64 kg/m.  General: Cooperative, alert, well developed, in no acute distress. HEENT: Conjunctivae and lids unremarkable. Cardiovascular: Regular rhythm.  Lungs: Normal work of breathing. Neurologic: No focal deficits.   Lab Results  Component Value Date   CREATININE 0.73 (L) 12/04/2020   BUN 16 12/04/2020   NA 140 12/04/2020   K 4.1 12/04/2020   CL 101 12/04/2020   CO2 19 (L) 12/04/2020   Lab Results  Component Value Date   ALT 22 12/04/2020   AST 46 (H) 12/04/2020   ALKPHOS 95 12/04/2020   BILITOT 0.4 12/04/2020   Lab Results  Component Value Date   HGBA1C 7.7 (H) 12/04/2020   HGBA1C 7.4 (H) 06/27/2020   HGBA1C 9.3 (H) 12/02/2016   HGBA1C 8.5 (H) 06/03/2016   HGBA1C 8.9 (H) 01/30/2016   Lab Results  Component Value Date   INSULIN 5.2 12/04/2020   Lab Results  Component Value Date   TSH 1.550 12/04/2020   Lab Results  Component Value Date   CHOL 103 12/04/2020   HDL 30 (L) 12/04/2020   LDLCALC 47 12/04/2020   TRIG 147 12/04/2020   CHOLHDL 3.4 12/04/2020   Lab Results  Component Value Date   WBC 11.4 (H) 12/04/2020   HGB 15.5 12/04/2020   HCT 47.5 12/04/2020   MCV 84 12/04/2020   PLT 280 12/04/2020    Attestation Statements:   Reviewed by clinician on day of visit: allergies, medications, problem list, medical history, surgical history, family history, social history, and previous encounter notes.  I, Water quality scientist, CMA, am acting as Location manager for [employer], DO.  I have reviewed the above documentation for accuracy and completeness, and I agree with the above. Marjory Sneddon, D.O.  The [place] was signed into law in 2016 which includes the topic of electronic health records.  This provides immediate access to information in MyChart.  This includes consultation notes, operative notes, office notes, lab results and pathology reports.  If you have any questions about what you read please let us know at your next visit so we can discuss your concerns and take corrective action if need be.  We are right here with you.

## 2021-01-13 ENCOUNTER — Ambulatory Visit (INDEPENDENT_AMBULATORY_CARE_PROVIDER_SITE_OTHER): Payer: Managed Care, Other (non HMO) | Admitting: Family Medicine

## 2021-01-13 ENCOUNTER — Encounter (INDEPENDENT_AMBULATORY_CARE_PROVIDER_SITE_OTHER): Payer: Self-pay | Admitting: Family Medicine

## 2021-01-13 ENCOUNTER — Other Ambulatory Visit: Payer: Self-pay

## 2021-01-13 VITALS — BP 111/73 | HR 76 | Temp 98.3°F | Ht 68.0 in | Wt 284.0 lb

## 2021-01-13 DIAGNOSIS — E559 Vitamin D deficiency, unspecified: Secondary | ICD-10-CM

## 2021-01-13 DIAGNOSIS — E1159 Type 2 diabetes mellitus with other circulatory complications: Secondary | ICD-10-CM | POA: Diagnosis not present

## 2021-01-13 DIAGNOSIS — E1169 Type 2 diabetes mellitus with other specified complication: Secondary | ICD-10-CM | POA: Diagnosis not present

## 2021-01-13 DIAGNOSIS — Z794 Long term (current) use of insulin: Secondary | ICD-10-CM

## 2021-01-13 DIAGNOSIS — Z6841 Body Mass Index (BMI) 40.0 and over, adult: Secondary | ICD-10-CM

## 2021-01-13 DIAGNOSIS — I152 Hypertension secondary to endocrine disorders: Secondary | ICD-10-CM

## 2021-01-22 NOTE — Progress Notes (Signed)
Chief Complaint:   OBESITY Brandon Rich is here to discuss his progress with his obesity treatment plan along with follow-up of his obesity related diagnoses.   Today's visit was #: 4 Starting weight: 302 lbs Starting date: 12/04/2020 Today's weight: 284 lbs Today's date: 01/13/2021 Weight change since last visit: 3 lbs Total lbs lost to date: 18 lbs Body mass index is 43.18 kg/m.  Total weight loss percentage to date: -5.96%  Interim History:  Brandon Rich is here for a follow up office visit.  We reviewed his meal plan and questions were answered.  Patient's food recall appears to be accurate and consistent with what is on plan when he is following it.   When eating on plan, his hunger and cravings are well controlled.    Brandon Rich has questions today regarding foods for snacks, acceptable substitutions, food and recipe creations, etc.  He declines journaling.  Plan working very well for him and want sto continue same.  Extensive counseling done today regarding meal plan and strategies for success with weight loss.  Current Meal Plan: the Category 4 Plan with breakfast options for 96% of the time.  Current Exercise Plan: None. Current Anti-Obesity Medications: Ozempic 1 mg subcutaneously weekly. Side effects: None.  Assessment/Plan:   1. Type 2 diabetes mellitus with other specified complication, with long-term current use of insulin (HCC) Diabetes Mellitus: Not at goal. Medication: metformin 1,000 mg twice daily and Ozempic 1 mg subcutaneously weekly, Jardiance 25 mg daily, and insulin, which he is weaning down on. Issues reviewed: blood sugar goals, complications of diabetes mellitus, hypoglycemia prevention and treatment, exercise, and nutrition.  FBS 110s-120s and stable.  Feels great.  Plan: The importance of regular follow up with PCP and all other specialists as scheduled was stressed to patient today. The patient will continue to focus on protein-rich, low simple  carbohydrate foods. We reviewed the importance of hydration, regular exercise for stress reduction, and restorative sleep.   Lab Results  Component Value Date   HGBA1C 7.7 (H) 12/04/2020   HGBA1C 7.4 (H) 06/27/2020   HGBA1C 9.3 (H) 12/02/2016   Lab Results  Component Value Date   MICROALBUR 1.3 06/03/2016   LDLCALC 47 12/04/2020   CREATININE 0.73 (L) 12/04/2020   2. Hypertension associated with type 2 diabetes mellitus ([place]) At goal. Medications: lisinopril 40 mg daily.  Asymptomatic, without concerns today.  Plan: Avoid buying foods that are: processed, frozen, or prepackaged to avoid excess salt. We will watch for signs of hypotension as he continues lifestyle modifications. We will continue to monitor closely alongside his PCP and/or Specialist.  Regular follow up with PCP and specialists was also encouraged.   BP Readings from Last 3 Encounters:  01/13/21 111/73  12/30/20 122/76  12/26/20 124/72   Lab Results  Component Value Date   CREATININE 0.73 (L) 12/04/2020   3. Vitamin D deficiency Not at goal.  Tolerating medication well with no side effects.  Denies need for refill today.  Good compliance.  Plan: Continue to take prescription Vitamin D @50 ,000 IU every week as prescribed.  Recheck in 3-4 months.  Counseling done.    Lab Results  Component Value Date   VD25OH 41.3 12/04/2020   4. Obesity with current BMI of 43.18  Course: Brandon Rich is currently in the action stage of change. As such, his goal is to continue with weight loss efforts.   Nutrition goals: He has agreed to the Category 4 Plan with breakfast options.  Exercise goals:  As is.  Behavioral modification strategies: increasing water intake, keeping healthy foods in the home, ways to avoid night time snacking, better snacking choices, and planning for success.  Brandon Rich has agreed to follow-up with our clinic in 2 weeks. He was informed of the importance of frequent follow-up visits to maximize his  success with intensive lifestyle modifications for his multiple health conditions.   Objective:   Blood pressure 111/73, pulse 76, temperature 98.3 F (36.8 C), height 5\' 8"  (1.727 m), weight 284 lb (128.8 kg), SpO2 97 %. Body mass index is 43.18 kg/m.  General: Cooperative, alert, well developed, in no acute distress. HEENT: Conjunctivae and lids unremarkable. Cardiovascular: Regular rhythm.  Lungs: Normal work of breathing. Neurologic: No focal deficits.   Lab Results  Component Value Date   CREATININE 0.73 (L) 12/04/2020   BUN 16 12/04/2020   NA 140 12/04/2020   K 4.1 12/04/2020   CL 101 12/04/2020   CO2 19 (L) 12/04/2020   Lab Results  Component Value Date   ALT 22 12/04/2020   AST 46 (H) 12/04/2020   ALKPHOS 95 12/04/2020   BILITOT 0.4 12/04/2020   Lab Results  Component Value Date   HGBA1C 7.7 (H) 12/04/2020   HGBA1C 7.4 (H) 06/27/2020   HGBA1C 9.3 (H) 12/02/2016   HGBA1C 8.5 (H) 06/03/2016   HGBA1C 8.9 (H) 01/30/2016   Lab Results  Component Value Date   INSULIN 5.2 12/04/2020   Lab Results  Component Value Date   TSH 1.550 12/04/2020   Lab Results  Component Value Date   CHOL 103 12/04/2020   HDL 30 (L) 12/04/2020   LDLCALC 47 12/04/2020   TRIG 147 12/04/2020   CHOLHDL 3.4 12/04/2020   Lab Results  Component Value Date   VD25OH 41.3 12/04/2020   Lab Results  Component Value Date   WBC 11.4 (H) 12/04/2020   HGB 15.5 12/04/2020   HCT 47.5 12/04/2020   MCV 84 12/04/2020   PLT 280 12/04/2020   Attestation Statements:   Reviewed by clinician on day of visit: allergies, medications, problem list, medical history, surgical history, family history, social history, and previous encounter notes.  Time spent on visit including pre-visit chart review and post-visit care and charting was 40 minutes.   I, Water quality scientist, CMA, am acting as Location manager for [employer], DO.  I have reviewed the above documentation for accuracy and completeness,  and I agree with the above. Marjory Sneddon, D.O.  The [place] was signed into law in 2016 which includes the topic of electronic health records.  This provides immediate access to information in MyChart.  This includes consultation notes, operative notes, office notes, lab results and pathology reports.  If you have any questions about what you read please let us know at your next visit so we can discuss your concerns and take corrective action if need be.  We are right here with you.

## 2021-01-27 ENCOUNTER — Other Ambulatory Visit (INDEPENDENT_AMBULATORY_CARE_PROVIDER_SITE_OTHER): Payer: Self-pay | Admitting: Family Medicine

## 2021-01-27 ENCOUNTER — Encounter (INDEPENDENT_AMBULATORY_CARE_PROVIDER_SITE_OTHER): Payer: Self-pay | Admitting: Family Medicine

## 2021-01-27 ENCOUNTER — Ambulatory Visit (INDEPENDENT_AMBULATORY_CARE_PROVIDER_SITE_OTHER): Payer: Managed Care, Other (non HMO) | Admitting: Family Medicine

## 2021-01-27 ENCOUNTER — Other Ambulatory Visit: Payer: Self-pay

## 2021-01-27 VITALS — BP 107/64 | HR 74 | Temp 98.1°F | Ht 68.0 in | Wt 279.0 lb

## 2021-01-27 DIAGNOSIS — E559 Vitamin D deficiency, unspecified: Secondary | ICD-10-CM

## 2021-01-27 DIAGNOSIS — Z9189 Other specified personal risk factors, not elsewhere classified: Secondary | ICD-10-CM

## 2021-01-27 DIAGNOSIS — Z6841 Body Mass Index (BMI) 40.0 and over, adult: Secondary | ICD-10-CM

## 2021-01-27 DIAGNOSIS — I10 Essential (primary) hypertension: Secondary | ICD-10-CM | POA: Diagnosis not present

## 2021-01-28 ENCOUNTER — Other Ambulatory Visit (INDEPENDENT_AMBULATORY_CARE_PROVIDER_SITE_OTHER): Payer: Self-pay | Admitting: Family Medicine

## 2021-01-28 DIAGNOSIS — E559 Vitamin D deficiency, unspecified: Secondary | ICD-10-CM

## 2021-01-28 NOTE — Telephone Encounter (Signed)
Last OV with Dr Opalski 

## 2021-01-29 ENCOUNTER — Encounter (INDEPENDENT_AMBULATORY_CARE_PROVIDER_SITE_OTHER): Payer: Self-pay | Admitting: Family Medicine

## 2021-01-29 MED ORDER — VITAMIN D (ERGOCALCIFEROL) 1.25 MG (50000 UNIT) PO CAPS: 50000.0000 [IU] | ORAL_CAPSULE | ORAL | 0 refills | Status: DC

## 2021-01-29 NOTE — Telephone Encounter (Signed)
Last OV with Dr Opalski 

## 2021-02-09 NOTE — Progress Notes (Signed)
Chief Complaint:   OBESITY Brandon Rich is here to discuss his progress with his obesity treatment plan along with follow-up of his obesity related diagnoses.   Today's visit was #: 5 Starting weight: 302 lbs Starting date: 12/04/2020 Today's weight: 279 lbs Today's date: 01/27/2021 Weight change since last visit: 5 lbs Total lbs lost to date: 23 lbs Body mass index is 42.42 kg/m.  Total weight loss percentage to date: -7.62%  Interim History:  Brandon Rich says he feels like it is difficult to eat all food/protein on plan at times.  Hunger well controlled.  Cravings well controlled with using snack calories.  Current Meal Plan: the Category 4 Plan with breakfast options for 96% of the time.  Current Exercise Plan: None. Current Anti-Obesity Medications: Ozempic 1 mg subcutaneously weekly. Side effects: None.  Assessment/Plan:   Medications Discontinued During This Encounter  Medication Reason   Vitamin D, Ergocalciferol, (DRISDOL) 1.25 MG (50000 UNIT) CAPS capsule Reorder    Meds ordered this encounter  Medications   Vitamin D, Ergocalciferol, (DRISDOL) 1.25 MG (50000 UNIT) CAPS capsule    Sig: Take 1 capsule (50,000 Units total) by mouth every 7 (seven) days.    Dispense:  4 capsule    Refill:  0    Ov for rf    1. Vitamin D deficiency Not optimized. Plan: Continue to take prescription Vitamin D '@50'$ ,000 IU every week as prescribed.  Follow-up for routine testing of Vitamin D, at least 2-3 times per year to avoid over-replacement.  Lab Results  Component Value Date   VD25OH 41.3 12/04/2020   - Refill Vitamin D, Ergocalciferol, (DRISDOL) 1.25 MG (50000 UNIT) CAPS capsule; Take 1 capsule (50,000 Units total) by mouth every 7 (seven) days.  Dispense: 4 capsule; Refill: 0  2. Essential hypertension At goal. Medications: lisinopril 40 mg daily.  Blood pressure is 120s/70s at home and under good control.  Plan:  continue medication.  Avoid buying foods that are: processed,  frozen, or prepackaged to avoid excess salt. Ambulatory blood pressure monitoring was encouraged with a goal of at least 2-3 times weekly or when feeling poorly.  He was instructed to keep a log for [place] to review at each office visit.   We will watch for signs of hypotension as he continues lifestyle modifications. We will continue to monitor closely alongside his PCP and/or Specialist.  Regular follow up with PCP and specialists was also encouraged.   BP Readings from Last 3 Encounters:  01/27/21 107/64  01/13/21 111/73  12/30/20 122/76   Lab Results  Component Value Date   CREATININE 0.73 (L) 12/04/2020   3. At risk for activity intolerance Brandon Rich was given approximately 9 minutes of counseling today regarding his increased risk for exercise intolerance.  We discussed patient's specific personal and medical issues that raise our concern.  He was advised of strategies to prevent injury and ways to improve his cardiopulmonary fitness levels slowly over time.  We additionally discussed various fitness trackers and smart phone apps to help motivate the patient to stay on track.   4. Obesity BMI today 42  Course: Brandon Rich is currently in the action stage of change. As such, his goal is to continue with weight loss efforts.   Nutrition goals: He has agreed to the Category 4 Plan.   Exercise goals: All adults should avoid inactivity. Some physical activity is better than none, and adults who participate in any amount of physical activity gain some health benefits. Start walking for  10 minutes 3 days per week.  Behavioral modification strategies: increasing lean protein intake, decreasing simple carbohydrates, and planning for success.  Brandon Rich has agreed to follow-up with our clinic in 4 weeks with me and 2 weeks with Jake Bathe, FNP. He was informed of the importance of frequent follow-up visits to maximize his success with intensive lifestyle modifications for his multiple health conditions.    Objective:   Blood pressure 107/64, pulse 74, temperature 98.1 F (36.7 C), height '5\' 8"'$  (1.727 m), weight 279 lb (126.6 kg), SpO2 97 %. Body mass index is 42.42 kg/m.  General: Cooperative, alert, well developed, in no acute distress. HEENT: Conjunctivae and lids unremarkable. Cardiovascular: Regular rhythm.  Lungs: Normal work of breathing. Neurologic: No focal deficits.   Lab Results  Component Value Date   CREATININE 0.73 (L) 12/04/2020   BUN 16 12/04/2020   NA 140 12/04/2020   K 4.1 12/04/2020   CL 101 12/04/2020   CO2 19 (L) 12/04/2020   Lab Results  Component Value Date   ALT 22 12/04/2020   AST 46 (H) 12/04/2020   ALKPHOS 95 12/04/2020   BILITOT 0.4 12/04/2020   Lab Results  Component Value Date   HGBA1C 7.7 (H) 12/04/2020   HGBA1C 7.4 (H) 06/27/2020   HGBA1C 9.3 (H) 12/02/2016   HGBA1C 8.5 (H) 06/03/2016   HGBA1C 8.9 (H) 01/30/2016   Lab Results  Component Value Date   INSULIN 5.2 12/04/2020   Lab Results  Component Value Date   TSH 1.550 12/04/2020   Lab Results  Component Value Date   CHOL 103 12/04/2020   HDL 30 (L) 12/04/2020   LDLCALC 47 12/04/2020   TRIG 147 12/04/2020   CHOLHDL 3.4 12/04/2020   Lab Results  Component Value Date   VD25OH 41.3 12/04/2020   Lab Results  Component Value Date   WBC 11.4 (H) 12/04/2020   HGB 15.5 12/04/2020   HCT 47.5 12/04/2020   MCV 84 12/04/2020   PLT 280 12/04/2020   Attestation Statements:   Reviewed by clinician on day of visit: allergies, medications, problem list, medical history, surgical history, family history, social history, and previous encounter notes.  I, Water quality scientist, CMA, am acting as Location manager for [employer], DO.  I have reviewed the above documentation for accuracy and completeness, and I agree with the above. Marjory Sneddon, D.O.  The [place] was signed into law in 2016 which includes the topic of electronic health records.  This provides  immediate access to information in MyChart.  This includes consultation notes, operative notes, office notes, lab results and pathology reports.  If you have any questions about what you read please let us know at your next visit so we can discuss your concerns and take corrective action if need be.  We are right here with you.

## 2021-02-10 ENCOUNTER — Encounter (INDEPENDENT_AMBULATORY_CARE_PROVIDER_SITE_OTHER): Payer: Self-pay | Admitting: Family Medicine

## 2021-02-10 ENCOUNTER — Ambulatory Visit (INDEPENDENT_AMBULATORY_CARE_PROVIDER_SITE_OTHER): Payer: Managed Care, Other (non HMO) | Admitting: Family Medicine

## 2021-02-10 ENCOUNTER — Other Ambulatory Visit: Payer: Self-pay

## 2021-02-10 VITALS — BP 105/62 | HR 80 | Temp 98.7°F | Ht 68.0 in | Wt 277.0 lb

## 2021-02-10 DIAGNOSIS — K5909 Other constipation: Secondary | ICD-10-CM

## 2021-02-10 DIAGNOSIS — E1140 Type 2 diabetes mellitus with diabetic neuropathy, unspecified: Secondary | ICD-10-CM

## 2021-02-10 DIAGNOSIS — Z794 Long term (current) use of insulin: Secondary | ICD-10-CM

## 2021-02-10 DIAGNOSIS — Z6841 Body Mass Index (BMI) 40.0 and over, adult: Secondary | ICD-10-CM

## 2021-02-15 NOTE — Progress Notes (Signed)
Chief Complaint:   OBESITY Brandon Rich is here to discuss his progress with his obesity treatment plan along with follow-up of his obesity related diagnoses. Brandon Rich is on the Category 4 Plan/with breakfast options and states he is following his eating plan approximately 96% of the time. Brandon Rich states he is walking for 10 minutes 3 times per week.  Today's visit was #: 6 Starting weight: 302 lbs Starting date: 12/04/2020 Today's weight: 277 lbs Today's date: 02/10/2021 Total lbs lost to date: 25 lbs Total lbs lost since last in-office visit: 2 lbs  Interim History: Brandon Rich adheres to plan very well. He denies hunger overall. He does use snack calories from [employer] or sugar free. He feels 10 oz of meat is too much at times.  Subjective:   1. Type 2 diabetes mellitus with diabetic neuropathy, with long-term current use of insulin (HCC) Brandon Rich's last A1C level was at goal (7.7). His average CBG is 111-115. He is taking Levemir 60 units (down from 80 units), Ozempic 1 mg and Jardiance. CBGs: 2 hr PP (90-105), fasting CBGs 90-105. He denies hypoglycemia. He sees an endocrinologist.  Lab Results  Component Value Date   HGBA1C 7.7 (H) 12/04/2020   HGBA1C 7.4 (H) 06/27/2020   HGBA1C 9.3 (H) 12/02/2016   Lab Results  Component Value Date   MICROALBUR 1.3 06/03/2016   LDLCALC 47 12/04/2020   CREATININE 0.73 (L) 12/04/2020   Lab Results  Component Value Date   INSULIN 5.2 12/04/2020    2. Other constipation Brandon Rich notes harder stools since starting a plan. Vegetables and water intake are adequate.   Assessment/Plan:   1. Type 2 diabetes mellitus with diabetic neuropathy, with long-term current use of insulin ([place]) Brandon Rich will continue Jardiance, Levemir and Ozempic.   2. Other constipation  Brandon Rich will use Miralax or Metamucil, as needed..   3. Obesity: Current BMI 42.13 Brandon Rich is currently in the action stage of change. As such, his goal is to continue with weight  loss efforts. He has agreed to the Category 4 Plan and keeping a food journal and adhering to recommended goals of 550-700 calories and 45 grams of protein at supper.   Handouts: Protein exchanges.  Exercise goals:  Discussed possibly starting using exercise  bike.  Behavioral modification strategies: meal planning and cooking strategies and better snacking choices.  Brandon Rich has agreed to follow-up with our clinic in 2-3 weeks with Dr. Raliegh Scarlet.  Objective:   Blood pressure 105/62, pulse 80, temperature 98.7 F (37.1 C), height '5\' 8"'$  (1.727 m), weight 277 lb (125.6 kg), SpO2 99 %. Body mass index is 42.12 kg/m.  General: Cooperative, alert, well developed, in no acute distress. HEENT: Conjunctivae and lids unremarkable. Cardiovascular: Regular rhythm.  Lungs: Normal work of breathing. Neurologic: No focal deficits.   Lab Results  Component Value Date   CREATININE 0.73 (L) 12/04/2020   BUN 16 12/04/2020   NA 140 12/04/2020   K 4.1 12/04/2020   CL 101 12/04/2020   CO2 19 (L) 12/04/2020   Lab Results  Component Value Date   ALT 22 12/04/2020   AST 46 (H) 12/04/2020   ALKPHOS 95 12/04/2020   BILITOT 0.4 12/04/2020   Lab Results  Component Value Date   HGBA1C 7.7 (H) 12/04/2020   HGBA1C 7.4 (H) 06/27/2020   HGBA1C 9.3 (H) 12/02/2016   HGBA1C 8.5 (H) 06/03/2016   HGBA1C 8.9 (H) 01/30/2016   Lab Results  Component Value Date   INSULIN 5.2 12/04/2020  Lab Results  Component Value Date   TSH 1.550 12/04/2020   Lab Results  Component Value Date   CHOL 103 12/04/2020   HDL 30 (L) 12/04/2020   LDLCALC 47 12/04/2020   TRIG 147 12/04/2020   CHOLHDL 3.4 12/04/2020   Lab Results  Component Value Date   VD25OH 41.3 12/04/2020   Lab Results  Component Value Date   WBC 11.4 (H) 12/04/2020   HGB 15.5 12/04/2020   HCT 47.5 12/04/2020   MCV 84 12/04/2020   PLT 280 12/04/2020   No results found for: IRON, TIBC, FERRITIN  Attestation Statements:   Reviewed by  clinician on day of visit: allergies, medications, problem list, medical history, surgical history, family history, social history, and previous encounter notes.  Time spent on visit including pre-visit chart review and post-visit care and charting was 33 minutes.   I, Lizbeth Bark, RMA, am acting as Location manager for [employer], [place].   I have reviewed the above documentation for accuracy and completeness, and I agree with the above. -  Georgianne Fick, FNP

## 2021-02-16 DIAGNOSIS — Z6841 Body Mass Index (BMI) 40.0 and over, adult: Secondary | ICD-10-CM | POA: Insufficient documentation

## 2021-02-24 ENCOUNTER — Other Ambulatory Visit: Payer: Self-pay

## 2021-02-24 ENCOUNTER — Ambulatory Visit (INDEPENDENT_AMBULATORY_CARE_PROVIDER_SITE_OTHER): Payer: Managed Care, Other (non HMO) | Admitting: Family Medicine

## 2021-02-24 ENCOUNTER — Encounter (INDEPENDENT_AMBULATORY_CARE_PROVIDER_SITE_OTHER): Payer: Self-pay | Admitting: Family Medicine

## 2021-02-24 VITALS — BP 111/70 | HR 69 | Temp 98.2°F | Ht 68.0 in | Wt 273.0 lb

## 2021-02-24 DIAGNOSIS — Z794 Long term (current) use of insulin: Secondary | ICD-10-CM

## 2021-02-24 DIAGNOSIS — K760 Fatty (change of) liver, not elsewhere classified: Secondary | ICD-10-CM | POA: Diagnosis not present

## 2021-02-24 DIAGNOSIS — E559 Vitamin D deficiency, unspecified: Secondary | ICD-10-CM

## 2021-02-24 DIAGNOSIS — Z9189 Other specified personal risk factors, not elsewhere classified: Secondary | ICD-10-CM | POA: Diagnosis not present

## 2021-02-24 DIAGNOSIS — E1140 Type 2 diabetes mellitus with diabetic neuropathy, unspecified: Secondary | ICD-10-CM | POA: Diagnosis not present

## 2021-02-24 DIAGNOSIS — Z6841 Body Mass Index (BMI) 40.0 and over, adult: Secondary | ICD-10-CM

## 2021-02-24 DIAGNOSIS — E1169 Type 2 diabetes mellitus with other specified complication: Secondary | ICD-10-CM | POA: Diagnosis not present

## 2021-02-24 DIAGNOSIS — E785 Hyperlipidemia, unspecified: Secondary | ICD-10-CM

## 2021-02-24 MED ORDER — VITAMIN D (ERGOCALCIFEROL) 1.25 MG (50000 UNIT) PO CAPS: 50000.0000 [IU] | ORAL_CAPSULE | ORAL | 0 refills | Status: DC

## 2021-02-24 NOTE — Progress Notes (Signed)
Chief Complaint:   OBESITY Brandon Rich is here to discuss his progress with his obesity treatment plan along with follow-up of his obesity related diagnoses. Brandon Rich is on the Category 4 Plan and keeping a food journal and adhering to recommended goals of 550-700 calories and 45 grams of protein daily and states he is following his eating plan approximately 90-95% of the time. Brandon Rich states he is riding exercise bike for 20 minutes 3 times per week.  Today's visit was #: 7 Starting weight: 302 lbs Starting date: 12/04/2020 Today's weight: 273 lbs Today's date: 02/24/2021 Total lbs lost to date: 29 Total lbs lost since last in-office visit: 4  Interim History: Brandon Rich is here for a follow up office visit.  We reviewed his meal plan and questions were answered.  Patient's food recall appears to be accurate and consistent with what is on plan when he is following it.   When eating on plan, his hunger and cravings are well controlled.    Subjective:   1. Type 2 diabetes mellitus with diabetic neuropathy, with long-term current use of insulin (HCC) Brandon Rich's blood sugar is stable ay home, ranging between 110-120's. Medications reviewed. Diabetic ROS: no polyuria or polydipsia, no chest pain, dyspnea or TIA's, no numbness, tingling or pain in extremities.   2. Hyperlipidemia associated with type 2 diabetes mellitus (HCC) Brandon Rich is taking Lipitor. Medication(s) reviewed. Patient denies myalgias.   3. Vitamin D deficiency Brandon Rich is currently taking prescription vitamin D 50,000 IU each week. He denies nausea, vomiting or muscle weakness.  4. Nonalcoholic hepatosteatosis NAFLD is an umbrella term that encompasses a disease spectrum that includes steatosis (fat) without inflammation, steatohepatitis (NASH; fat + inflammation in a characteristic pattern), and cirrhosis. Bland steatosis is felt to be a benign condition, with extremely low to no risk of progression to cirrhosis, whereas  NASH can progress to cirrhosis. The mainstay of treatment of NAFLD includes lifestyle modification to achieve weight loss, at least 7% of current body weight. Low carbohydrate diets can be beneficial in improving NAFLD liver histology. Additionally, exercise, even the absence of weight loss can have beneficial effects on the patient's metabolic profile and liver health.   5. At risk for hypoglycemia Brandon Rich is at increased risk for hypoglycemia due to changes in diet, diagnosis of diabetes, and/or insulin use.   Assessment/Plan:   Orders Placed This Encounter  Procedures   Comprehensive metabolic panel   Hemoglobin A1c   VITAMIN D 25 Hydroxy (Vit-D Deficiency, Fractures)    Medications Discontinued During This Encounter  Medication Reason   Vitamin D, Ergocalciferol, (DRISDOL) 1.25 MG (50000 UNIT) CAPS capsule Reorder     Meds ordered this encounter  Medications   Vitamin D, Ergocalciferol, (DRISDOL) 1.25 MG (50000 UNIT) CAPS capsule    Sig: Take 1 capsule (50,000 Units total) by mouth every 7 (seven) days.    Dispense:  4 capsule    Refill:  0    Ov for rf     1. Type 2 diabetes mellitus with diabetic neuropathy, with long-term current use of insulin (HCC) We will check labs at [facility] next office visit. Good blood sugar control is important to decrease the likelihood of diabetic complications such as nephropathy, neuropathy, limb loss, blindness, coronary artery disease, and death. Intensive lifestyle modification including diet, exercise and weight loss are the first line of treatment for diabetes.   - Hemoglobin A1c  2. Hyperlipidemia associated with type 2 diabetes mellitus ([place]) Cardiovascular risk and specific  lipid/LDL goals reviewed. We discussed several lifestyle modifications today. I recommended we recheck cholesterol panel in 4-6 months so he may increase exercise to improve his low HDL. Orders and follow up as documented in patient record.   Counseling Intensive  lifestyle modifications are the first line treatment for this issue. Dietary changes: Increase soluble fiber. Decrease simple carbohydrates. Exercise changes: Moderate to vigorous-intensity aerobic activity 150 minutes per week if tolerated. Lipid-lowering medications: see documented in medical record.  3. Vitamin D deficiency Low Vitamin D level contributes to fatigue and are associated with obesity, breast, and colon cancer. We will refill prescription Vitamin D 50,000 IU every week for 1 month. We will recheck labs at his next office visit. Brandon Rich will follow-up for routine testing of Vitamin D, at least 2-3 times per year to avoid over-replacement.  - Vitamin D, Ergocalciferol, (DRISDOL) 1.25 MG (50000 UNIT) CAPS capsule; Take 1 capsule (50,000 Units total) by mouth every 7 (seven) days.  Dispense: 4 capsule; Refill: 0 - VITAMIN D 25 Hydroxy (Vit-D Deficiency, Fractures)  4. Nonalcoholic hepatosteatosis We will recheck CMP at [place] next office visit.  - Comprehensive metabolic panel  5. At risk for hypoglycemia Brandon Rich was given approximately 15 minutes of counseling today regarding prevention of hypoglycemia. He was advised of symptoms of hypoglycemia. Brandon Rich was instructed to avoid skipping meals, eat regular protein rich meals and schedule low calorie snacks as needed.   Repetitive spaced learning was employed today to elicit superior memory formation and behavioral change  6. Obesity with current BMI 41.6 Brandon Rich is currently in the action stage of change. As such, his goal is to continue with weight loss efforts. He has agreed to the Category 4 Plan and keeping a food journal and adhering to recommended goals of 550-700 calories and 45 grams of protein at supper daily.   Exercise goals: As is. Increase as tolerated to 4 days per week.  Behavioral modification strategies: meal planning and cooking strategies and planning for success.  Brandon Rich has agreed to follow-up with our  clinic in 2 to 3 weeks with [facility], FNP-C. He was informed of the importance of frequent follow-up visits to maximize his success with intensive lifestyle modifications for his multiple health conditions.   Objective:   Blood pressure 111/70, pulse 69, temperature 98.2 F (36.8 C), height '5\' 8"'$  (1.727 m), weight 273 lb (123.8 kg), SpO2 97 %. Body mass index is 41.51 kg/m.  General: Cooperative, alert, well developed, in no acute distress. HEENT: Conjunctivae and lids unremarkable. Cardiovascular: Regular rhythm.  Lungs: Normal work of breathing. Neurologic: No focal deficits.   Lab Results  Component Value Date   CREATININE 0.73 (L) 12/04/2020   BUN 16 12/04/2020   NA 140 12/04/2020   K 4.1 12/04/2020   CL 101 12/04/2020   CO2 19 (L) 12/04/2020   Lab Results  Component Value Date   ALT 22 12/04/2020   AST 46 (H) 12/04/2020   ALKPHOS 95 12/04/2020   BILITOT 0.4 12/04/2020   Lab Results  Component Value Date   HGBA1C 7.7 (H) 12/04/2020   HGBA1C 7.4 (H) 06/27/2020   HGBA1C 9.3 (H) 12/02/2016   HGBA1C 8.5 (H) 06/03/2016   HGBA1C 8.9 (H) 01/30/2016   Lab Results  Component Value Date   INSULIN 5.2 12/04/2020   Lab Results  Component Value Date   TSH 1.550 12/04/2020   Lab Results  Component Value Date   CHOL 103 12/04/2020   HDL 30 (L) 12/04/2020   [place]  47 12/04/2020   TRIG 147 12/04/2020   CHOLHDL 3.4 12/04/2020   Lab Results  Component Value Date   VD25OH 41.3 12/04/2020   Lab Results  Component Value Date   WBC 11.4 (H) 12/04/2020   HGB 15.5 12/04/2020   HCT 47.5 12/04/2020   MCV 84 12/04/2020   PLT 280 12/04/2020   No results found for: IRON, TIBC, FERRITIN  Attestation Statements:   Reviewed by clinician on day of visit: allergies, medications, problem list, medical history, surgical history, family history, social history, and previous encounter notes.   Wilhemena Durie, am acting as transcriptionist for [employer], DO.  I  have reviewed the above documentation for accuracy and completeness, and I agree with the above. Marjory Sneddon, D.O.  The [place] was signed into law in 2016 which includes the topic of electronic health records.  This provides immediate access to information in MyChart.  This includes consultation notes, operative notes, office notes, lab results and pathology reports.  If you have any questions about what you read please let us know at your next visit so we can discuss your concerns and take corrective action if need be.  We are right here with you.

## 2021-03-13 LAB — COMPREHENSIVE METABOLIC PANEL
ALT: 15 IU/L (ref 0–44)
AST: 44 IU/L — ABNORMAL HIGH (ref 0–40)
Albumin/Globulin Ratio: 1.9 (ref 1.2–2.2)
Albumin: 4.7 g/dL (ref 3.8–4.8)
Alkaline Phosphatase: 82 IU/L (ref 44–121)
BUN/Creatinine Ratio: 26 — ABNORMAL HIGH (ref 10–24)
BUN: 22 mg/dL (ref 8–27)
Bilirubin Total: 0.3 mg/dL (ref 0.0–1.2)
CO2: 21 mmol/L (ref 20–29)
Calcium: 9.5 mg/dL (ref 8.6–10.2)
Chloride: 102 mmol/L (ref 96–106)
Creatinine, Ser: 0.85 mg/dL (ref 0.76–1.27)
Globulin, Total: 2.5 g/dL (ref 1.5–4.5)
Glucose: 105 mg/dL — ABNORMAL HIGH (ref 65–99)
Potassium: 4.3 mmol/L (ref 3.5–5.2)
Sodium: 138 mmol/L (ref 134–144)
Total Protein: 7.2 g/dL (ref 6.0–8.5)
eGFR: 99 mL/min/{1.73_m2} (ref >59)

## 2021-03-13 LAB — VITAMIN D 25 HYDROXY (VIT D DEFICIENCY, FRACTURES): Vit D, 25-Hydroxy: 62.7 ng/mL (ref 30.0–100.0)

## 2021-03-13 LAB — HEMOGLOBIN A1C
Est. average glucose Bld gHb Est-mCnc: 140 mg/dL
Hgb A1c MFr Bld: 6.5 % — ABNORMAL HIGH (ref 4.8–5.6)

## 2021-03-18 ENCOUNTER — Ambulatory Visit (INDEPENDENT_AMBULATORY_CARE_PROVIDER_SITE_OTHER): Payer: Managed Care, Other (non HMO) | Admitting: Family Medicine

## 2021-03-18 ENCOUNTER — Encounter (INDEPENDENT_AMBULATORY_CARE_PROVIDER_SITE_OTHER): Payer: Self-pay | Admitting: Family Medicine

## 2021-03-18 ENCOUNTER — Other Ambulatory Visit: Payer: Self-pay

## 2021-03-18 VITALS — BP 123/75 | HR 67 | Temp 98.4°F | Ht 68.0 in | Wt 269.0 lb

## 2021-03-18 DIAGNOSIS — E1140 Type 2 diabetes mellitus with diabetic neuropathy, unspecified: Secondary | ICD-10-CM

## 2021-03-18 DIAGNOSIS — E559 Vitamin D deficiency, unspecified: Secondary | ICD-10-CM | POA: Diagnosis not present

## 2021-03-18 DIAGNOSIS — Z6841 Body Mass Index (BMI) 40.0 and over, adult: Secondary | ICD-10-CM

## 2021-03-18 DIAGNOSIS — Z794 Long term (current) use of insulin: Secondary | ICD-10-CM | POA: Diagnosis not present

## 2021-03-18 MED ORDER — VITAMIN D 125 MCG (5000 UT) PO CAPS: 1.0000 | ORAL_CAPSULE | Freq: Every day | ORAL | 0 refills | Status: DC

## 2021-03-18 NOTE — Progress Notes (Signed)
Chief Complaint:   OBESITY Brandon Rich is here to discuss his progress with his obesity treatment plan along with follow-up of his obesity related diagnoses. Brandon Rich is on the Category 4 Plan and keeping a food journal and adhering to recommended goals of 550-700 calories and 45 grams of protein per supper and states he is following his eating plan approximately 90% of the time. Miner states he is biking and cardio for 20 minutes 2-3 times per week.  Today's visit was #: 8 Starting weight: 302 lbs Starting date: 12/04/2020 Today's weight: 269 lbs Today's date: 03/18/2021 Total lbs lost to date: 33 lbs Total lbs lost since last in-office visit: 4 lbs  Interim History: Brandon Rich is doing very well on the plan. His hunger is satisfied. He is not doing any resistance training other than with his legs (exercise bike). Bioimpedance scale shows he has lost 3 lbs of muscle mass since May 2022.  Subjective:   1. Vitamin D deficiency Jeanne has 7 pills left of his prescription Vit D. His Vitamin D level is at goal -62.7.  Lab Results  Component Value Date   VD25OH 62.7 03/12/2021   VD25OH 41.3 12/04/2020    2. Type 2 diabetes mellitus with diabetic neuropathy, with long-term current use of insulin (HCC) Well controlled. Trejuan's average CBG level is 115. His A1C is now 6.5 down from 7.7. He denies hypoglycemia. His diabetes is managed by Dr. Buddy Duty. He is on Levemir 60 units, Jardiance and Metformin.  Lab Results  Component Value Date   HGBA1C 6.5 (H) 03/12/2021   HGBA1C 7.7 (H) 12/04/2020   HGBA1C 7.4 (H) 06/27/2020   Lab Results  Component Value Date   MICROALBUR 1.3 06/03/2016   LDLCALC 47 12/04/2020   CREATININE 0.85 03/12/2021   Lab Results  Component Value Date   INSULIN 5.2 12/04/2020    Assessment/Plan:   1. Vitamin D deficiency Brandon Rich will finish last 7 pills of Vitamin D prescription. After that he will switch to 5000 IU of OTC Vitamin D and he will follow-up for  routine testing of Vitamin D, at least 2-3 times per year to avoid over-replacement.  - Cholecalciferol (VITAMIN D) 125 MCG (5000 UT) CAPS; Take 1 capsule by mouth daily.  Dispense: 30 capsule; Refill: 0  2. Type 2 diabetes mellitus with diabetic neuropathy, with long-term current use of insulin (HCC) Brandon Rich will message Dr. Buddy Duty about reducing Levemir. We discussed possible increasing to 2 mg of Ozempic if polyphagia develops.   3. Obesity with current BMI 40.91 Brysyn is currently in the action stage of change. As such, his goal is to continue with weight loss efforts. He has agreed to Category 4 Plan and keeping a food journal and adhering to recommended goals of 550-700 calories and 45 grams of protein daily.  Encouraged Brandon Rich to buy a weight set for resistance training for his arms. He will also work on doing some exercises for his abdominal muscles.  Exercise goals:  As is.  Behavioral modification strategies: increasing lean protein intake.  Brandon Rich has agreed to follow-up with our clinic in 3 weeks with Dr. Raliegh Scarlet.   Objective:   Blood pressure 123/75, pulse 67, temperature 98.4 F (36.9 C), height '5\' 8"'$  (1.727 m), weight 269 lb (122 kg), SpO2 97 %. Body mass index is 40.9 kg/m.  General: Cooperative, alert, well developed, in no acute distress. HEENT: Conjunctivae and lids unremarkable. Cardiovascular: Regular rhythm.  Lungs: Normal work of breathing. Neurologic: No focal deficits.  Lab Results  Component Value Date   CREATININE 0.85 03/12/2021   BUN 22 03/12/2021   NA 138 03/12/2021   K 4.3 03/12/2021   CL 102 03/12/2021   CO2 21 03/12/2021   Lab Results  Component Value Date   ALT 15 03/12/2021   AST 44 (H) 03/12/2021   ALKPHOS 82 03/12/2021   BILITOT 0.3 03/12/2021   Lab Results  Component Value Date   HGBA1C 6.5 (H) 03/12/2021   HGBA1C 7.7 (H) 12/04/2020   HGBA1C 7.4 (H) 06/27/2020   HGBA1C 9.3 (H) 12/02/2016   HGBA1C 8.5 (H) 06/03/2016   Lab  Results  Component Value Date   INSULIN 5.2 12/04/2020   Lab Results  Component Value Date   TSH 1.550 12/04/2020   Lab Results  Component Value Date   CHOL 103 12/04/2020   HDL 30 (L) 12/04/2020   LDLCALC 47 12/04/2020   TRIG 147 12/04/2020   CHOLHDL 3.4 12/04/2020   Lab Results  Component Value Date   VD25OH 62.7 03/12/2021   VD25OH 41.3 12/04/2020   Lab Results  Component Value Date   WBC 11.4 (H) 12/04/2020   HGB 15.5 12/04/2020   HCT 47.5 12/04/2020   MCV 84 12/04/2020   PLT 280 12/04/2020   No results found for: IRON, TIBC, FERRITIN   Attestation Statements:   Reviewed by clinician on day of visit: allergies, medications, problem list, medical history, surgical history, family history, social history, and previous encounter notes.  Time spent on visit including pre-visit chart review and post-visit care and charting was 32 minutes.   I, Lizbeth Bark, RMA, am acting as Location manager for [employer], [place].   I have reviewed the above documentation for accuracy and completeness, and I agree with the above. -  Georgianne Fick, FNP

## 2021-04-08 ENCOUNTER — Encounter (INDEPENDENT_AMBULATORY_CARE_PROVIDER_SITE_OTHER): Payer: Self-pay | Admitting: Family Medicine

## 2021-04-08 ENCOUNTER — Other Ambulatory Visit: Payer: Self-pay

## 2021-04-08 ENCOUNTER — Ambulatory Visit (INDEPENDENT_AMBULATORY_CARE_PROVIDER_SITE_OTHER): Payer: Managed Care, Other (non HMO) | Admitting: Family Medicine

## 2021-04-08 VITALS — BP 108/66 | HR 67 | Temp 97.9°F | Ht 68.0 in | Wt 263.0 lb

## 2021-04-08 DIAGNOSIS — Z794 Long term (current) use of insulin: Secondary | ICD-10-CM

## 2021-04-08 DIAGNOSIS — E1169 Type 2 diabetes mellitus with other specified complication: Secondary | ICD-10-CM | POA: Diagnosis not present

## 2021-04-08 DIAGNOSIS — E1159 Type 2 diabetes mellitus with other circulatory complications: Secondary | ICD-10-CM | POA: Diagnosis not present

## 2021-04-08 DIAGNOSIS — E559 Vitamin D deficiency, unspecified: Secondary | ICD-10-CM

## 2021-04-08 DIAGNOSIS — Z9189 Other specified personal risk factors, not elsewhere classified: Secondary | ICD-10-CM

## 2021-04-08 DIAGNOSIS — Z6841 Body Mass Index (BMI) 40.0 and over, adult: Secondary | ICD-10-CM

## 2021-04-08 DIAGNOSIS — I152 Hypertension secondary to endocrine disorders: Secondary | ICD-10-CM

## 2021-04-08 NOTE — Progress Notes (Signed)
Chief Complaint:   OBESITY Brandon Rich is here to discuss his progress with his obesity treatment plan along with follow-up of his obesity related diagnoses. Brandon Rich is on the Category 4 Plan and keeping a food journal and adhering to recommended goals of 550-700 calories and 45 grams protein and states he is following his eating plan approximately 90% of the time. Brandon Rich states he is using recumbent bike 20 minutes 1-2 times per week.  Today's visit was #: 9 Starting weight: 302 lbs Starting date: 12/04/2020 Today's weight: 263 lbs Today's date: 04/08/2021 Total lbs lost to date: 39 Total lbs lost since last in-office visit: 6  Interim History: Brandon Rich is here for a follow up office visit.  We reviewed his meal plan and questions were answered.  Patient's food recall appears to be accurate and consistent with what is on plan when he is following it.   When eating on plan, his hunger and cravings are well controlled.    Subjective:   1. Type 2 diabetes mellitus with other specified complication, with long-term current use of insulin ([place]) Discussed labs with patient today. Brandon Rich is managed by endocrinologist, Dr. Buddy Duty, and had an appointment with him today. Fasting blood sugars run 95-105; one low in the 70's with mild symptoms.  2. Hypertension associated with type 2 diabetes mellitus ([place]) Per cardiology, in the past, increase to 40 mg lisinopril to "get maximum benefit". Pt denies dizziness.  3. Vitamin D deficiency Discussed labs with patient today. He is currently taking prescription vitamin D 50,000 IU each week. He denies nausea, vomiting or muscle weakness.  Lab Results  Component Value Date   VD25OH 62.7 03/12/2021   VD25OH 41.3 12/04/2020   4. At risk for hypoglycemia Brandon Rich is at increased risk for hypoglycemia due to changes in diet, diagnosis of diabetes, and/or insulin use.   Assessment/Plan:  No orders of the defined types were placed in this  encounter.   There are no discontinued medications.   No orders of the defined types were placed in this encounter.    1. Type 2 diabetes mellitus with other specified complication, with long-term current use of insulin (HCC) Good blood sugar control is important to decrease the likelihood of diabetic complications such as nephropathy, neuropathy, limb loss, blindness, coronary artery disease, and death. Intensive lifestyle modification including diet, exercise and weight loss are the first line of treatment for diabetes.  Decrease Levemir from 60 to 50 units QHS. Continue Jardiance, Metformin, and Ozempic.  2. Hypertension associated with type 2 diabetes mellitus ([place]) Brandon Rich is working on healthy weight loss and exercise to improve blood pressure control. We will watch for signs of hypotension as he continues his lifestyle modifications. We discussed signs and symptoms of hypotension for pt to keep an eye out for. No change in treatment today, but will follow closely.  3. Vitamin D deficiency Low Vitamin D level contributes to fatigue and are associated with obesity, breast, and colon cancer. He agrees to continue to take prescription Vitamin D 50,000 IU every week and will follow-up for routine testing of Vitamin D, at least 2-3 times per year to avoid over-replacement. Levels were within normal limits with prescription at the end of summer, thus no change in treatment.  4. At risk for hypoglycemia Brandon Rich was given approximately 9 minutes of counseling today regarding prevention of hypoglycemia. He was advised of symptoms of hypoglycemia. Brandon Rich was instructed to avoid skipping meals, eat regular protein rich meals and  schedule low calorie snacks as needed.   Repetitive spaced learning was employed today to elicit superior memory formation and behavioral change  5. Obesity with current BMI of 40.0  Brandon Rich is currently in the action stage of change. As such, his goal is to continue  with weight loss efforts. He has agreed to keeping a food journal and adhering to recommended goals of 550-700 calories and 45 grams protein and practicing portion control and making smarter food choices, such as increasing vegetables and decreasing simple carbohydrates.   Exercise goals: All adults should avoid inactivity. Some physical activity is better than none, and adults who participate in any amount of physical activity gain some health benefits. Try to add 2 days of resistance training.  Behavioral modification strategies: planning for success.  Brandon Rich has agreed to follow-up with our clinic in 3 weeks. He was informed of the importance of frequent follow-up visits to maximize his success with intensive lifestyle modifications for his multiple health conditions.   Objective:   Blood pressure 108/66, pulse 67, temperature 97.9 F (36.6 C), height 5\' 8"  (1.727 m), weight 263 lb (119.3 kg), SpO2 98 %. Body mass index is 39.99 kg/m.  General: Cooperative, alert, well developed, in no acute distress. HEENT: Conjunctivae and lids unremarkable. Cardiovascular: Regular rhythm.  Lungs: Normal work of breathing. Neurologic: No focal deficits.   Lab Results  Component Value Date   CREATININE 0.85 03/12/2021   BUN 22 03/12/2021   NA 138 03/12/2021   K 4.3 03/12/2021   CL 102 03/12/2021   CO2 21 03/12/2021   Lab Results  Component Value Date   ALT 15 03/12/2021   AST 44 (H) 03/12/2021   ALKPHOS 82 03/12/2021   BILITOT 0.3 03/12/2021   Lab Results  Component Value Date   HGBA1C 6.5 (H) 03/12/2021   HGBA1C 7.7 (H) 12/04/2020   HGBA1C 7.4 (H) 06/27/2020   HGBA1C 9.3 (H) 12/02/2016   HGBA1C 8.5 (H) 06/03/2016   Lab Results  Component Value Date   INSULIN 5.2 12/04/2020   Lab Results  Component Value Date   TSH 1.550 12/04/2020   Lab Results  Component Value Date   CHOL 103 12/04/2020   HDL 30 (L) 12/04/2020   LDLCALC 47 12/04/2020   TRIG 147 12/04/2020   CHOLHDL  3.4 12/04/2020   Lab Results  Component Value Date   VD25OH 62.7 03/12/2021   VD25OH 41.3 12/04/2020   Lab Results  Component Value Date   WBC 11.4 (H) 12/04/2020   HGB 15.5 12/04/2020   HCT 47.5 12/04/2020   MCV 84 12/04/2020   PLT 280 12/04/2020    Attestation Statements:   Reviewed by clinician on day of visit: allergies, medications, problem list, medical history, surgical history, family history, social history, and previous encounter notes.  Coral Ceo, CMA, am acting as transcriptionist for [employer], DO.  I have reviewed the above documentation for accuracy and completeness, and I agree with the above. Marjory Sneddon, D.O.  The [place] was signed into law in 2016 which includes the topic of electronic health records.  This provides immediate access to information in MyChart.  This includes consultation notes, operative notes, office notes, lab results and pathology reports.  If you have any questions about what you read please let us know at your next visit so we can discuss your concerns and take corrective action if need be.  We are right here with you.

## 2021-04-14 DIAGNOSIS — M2022 Hallux rigidus, left foot: Secondary | ICD-10-CM | POA: Insufficient documentation

## 2021-05-06 ENCOUNTER — Encounter (INDEPENDENT_AMBULATORY_CARE_PROVIDER_SITE_OTHER): Payer: Self-pay | Admitting: Family Medicine

## 2021-05-06 ENCOUNTER — Ambulatory Visit (INDEPENDENT_AMBULATORY_CARE_PROVIDER_SITE_OTHER): Payer: Managed Care, Other (non HMO) | Admitting: Family Medicine

## 2021-05-06 ENCOUNTER — Other Ambulatory Visit: Payer: Self-pay

## 2021-05-06 VITALS — BP 124/72 | HR 69 | Temp 97.4°F | Ht 68.0 in | Wt 260.0 lb

## 2021-05-06 DIAGNOSIS — E559 Vitamin D deficiency, unspecified: Secondary | ICD-10-CM | POA: Diagnosis not present

## 2021-05-06 DIAGNOSIS — Z9989 Dependence on other enabling machines and devices: Secondary | ICD-10-CM

## 2021-05-06 DIAGNOSIS — G4733 Obstructive sleep apnea (adult) (pediatric): Secondary | ICD-10-CM | POA: Diagnosis not present

## 2021-05-06 DIAGNOSIS — E1169 Type 2 diabetes mellitus with other specified complication: Secondary | ICD-10-CM

## 2021-05-06 DIAGNOSIS — Z9189 Other specified personal risk factors, not elsewhere classified: Secondary | ICD-10-CM

## 2021-05-06 DIAGNOSIS — Z6841 Body Mass Index (BMI) 40.0 and over, adult: Secondary | ICD-10-CM

## 2021-05-06 MED ORDER — VITAMIN D (ERGOCALCIFEROL) 1.25 MG (50000 UNIT) PO CAPS: 50000.0000 [IU] | ORAL_CAPSULE | ORAL | 0 refills | Status: DC

## 2021-05-06 NOTE — Progress Notes (Signed)
Chief Complaint:   OBESITY Brandon Rich is here to discuss his progress with his obesity treatment plan along with follow-up of his obesity related diagnoses. Brandon Rich is on keeping a food journal and adhering to recommended goals of 550-700 calories and 45 grams of protein and practicing portion control and making smarter food choices, such as increasing vegetables and decreasing simple carbohydrates and states he is following his eating plan approximately 85-90% of the time. Brandon Rich states he is on the recumbent bike for 30 minutes 3 times per week.  Today's visit was #: 10 Starting weight: 302 lbs Starting date: 12/04/2020 Today's weight: 260 lbs Today's date: 05/06/2021 Total lbs lost to date: 42 Total lbs lost since last in-office visit: 3  Interim History: Brandon Rich is here for a follow up office visit.  We reviewed his meal plan and questions were answered.  Patient's food recall appears to be accurate and consistent with what is on plan when he is following it.   When eating on plan, his hunger and cravings are well controlled.    Subjective:   1. Vitamin D deficiency Brandon Rich is currently taking prescription vitamin D 50,000 IU each week. He denies nausea, vomiting or muscle weakness.  2. Type 2 diabetes mellitus with other specified complication, without long-term current use of insulin (HCC) Brandon Rich is down to 50 units of Lantus from 100 units. His fasting BGs range between 100-120, and 2 hour post prandial are rarely over 120's.  3. OSA on CPAP Doris hasn't had a machine pressure die in 8-9 years. He has the same machine but incidents have gone down dramatically with weight loss.   4. At risk for osteoporosis Brandon Rich is at higher risk of osteopenia and osteoporosis due to Vitamin D deficiency.   Assessment/Plan:  No orders of the defined types were placed in this encounter.   There are no discontinued medications.   Meds ordered this encounter  Medications    Vitamin D, Ergocalciferol, (DRISDOL) 1.25 MG (50000 UNIT) CAPS capsule    Sig: Take 1 capsule (50,000 Units total) by mouth every 7 (seven) days.    Dispense:  4 capsule    Refill:  0     1. Vitamin D deficiency Low Vitamin D level contributes to fatigue and are associated with obesity, breast, and colon cancer. We will refill prescription Vitamin D for 1 month. Brandon Rich will follow-up for routine testing of Vitamin D, at least 2-3 times per year to avoid over-replacement.  - Vitamin D, Ergocalciferol, (DRISDOL) 1.25 MG (50000 UNIT) CAPS capsule; Take 1 capsule (50,000 Units total) by mouth every 7 (seven) days.  Dispense: 4 capsule; Refill: 0  2. Type 2 diabetes mellitus with other specified complication, without long-term current use of insulin (HCC) [name] with continue his medication adjustments per his Endocrinologist. Good blood sugar control is important to decrease the likelihood of diabetic complications such as nephropathy, neuropathy, limb loss, blindness, coronary artery disease, and death. Intensive lifestyle modification including diet, exercise and weight loss are the first line of treatment for diabetes.   3. OSA on CPAP Intensive lifestyle modifications are the first line treatment for this issue. We discussed several lifestyle modifications today. [name] will follow up with sleep medicine for interrogation and adjustment. He will continue to work on diet, exercise and weight loss efforts. We will continue to monitor. Orders and follow up as documented in patient record.   4. At risk for osteoporosis Brandon Rich was given approximately 15 minutes of  osteoporosis prevention counseling today. Brandon Rich is at risk for osteopenia and osteoporosis due to his Vitamin D deficiency. He was encouraged to take his Vitamin D and follow his higher calcium diet and increase strengthening exercise to help strengthen his bones and decrease his risk of osteopenia and osteoporosis.  Repetitive spaced  learning was employed today to elicit superior memory formation and behavioral change.  5. Obesity with current BMI of 39.6 Brandon Rich is currently in the action stage of change. As such, his goal is to continue with weight loss efforts. He has agreed to the Category 4 Plan and keeping a food journal and adhering to recommended goals of 550-700 calories and 50+ grams of protein at supper daily.   Exercise goals: For substantial health benefits, adults should do at least 150 minutes (2 hours and 30 minutes) a week of moderate-intensity, or 75 minutes (1 hour and 15 minutes) a week of vigorous-intensity aerobic physical activity, or an equivalent combination of moderate- and vigorous-intensity aerobic activity. Aerobic activity should be performed in episodes of at least 10 minutes, and preferably, it should be spread throughout the week.  Behavioral modification strategies: planning for success and keeping a strict food journal.  Brandon Rich has agreed to follow-up with our clinic in 3 to 4 weeks. He was informed of the importance of frequent follow-up visits to maximize his success with intensive lifestyle modifications for his multiple health conditions.   Objective:   Blood pressure 124/72, pulse 69, temperature (!) 97.4 F (36.3 C), height 5\' 8"  (1.727 m), weight 260 lb (117.9 kg), SpO2 98 %. Body mass index is 39.53 kg/m.  General: Cooperative, alert, well developed, in no acute distress. HEENT: Conjunctivae and lids unremarkable. Cardiovascular: Regular rhythm.  Lungs: Normal work of breathing. Neurologic: No focal deficits.   Lab Results  Component Value Date   CREATININE 0.85 03/12/2021   BUN 22 03/12/2021   NA 138 03/12/2021   K 4.3 03/12/2021   CL 102 03/12/2021   CO2 21 03/12/2021   Lab Results  Component Value Date   ALT 15 03/12/2021   AST 44 (H) 03/12/2021   ALKPHOS 82 03/12/2021   BILITOT 0.3 03/12/2021   Lab Results  Component Value Date   HGBA1C 6.5 (H) 03/12/2021    HGBA1C 7.7 (H) 12/04/2020   HGBA1C 7.4 (H) 06/27/2020   HGBA1C 9.3 (H) 12/02/2016   HGBA1C 8.5 (H) 06/03/2016   Lab Results  Component Value Date   INSULIN 5.2 12/04/2020   Lab Results  Component Value Date   TSH 1.550 12/04/2020   Lab Results  Component Value Date   CHOL 103 12/04/2020   HDL 30 (L) 12/04/2020   LDLCALC 47 12/04/2020   TRIG 147 12/04/2020   CHOLHDL 3.4 12/04/2020   Lab Results  Component Value Date   VD25OH 62.7 03/12/2021   VD25OH 41.3 12/04/2020   Lab Results  Component Value Date   WBC 11.4 (H) 12/04/2020   HGB 15.5 12/04/2020   HCT 47.5 12/04/2020   MCV 84 12/04/2020   PLT 280 12/04/2020   No results found for: IRON, TIBC, FERRITIN  Attestation Statements:   Reviewed by clinician on day of visit: allergies, medications, problem list, medical history, surgical history, family history, social history, and previous encounter notes.   Wilhemena Durie, am acting as transcriptionist for [employer], DO.  I have reviewed the above documentation for accuracy and completeness, and I agree with the above. Marjory Sneddon, D.O.  The 21st Century  Cures Act was signed into law in 2016 which includes the topic of electronic health records.  This provides immediate access to information in MyChart.  This includes consultation notes, operative notes, office notes, lab results and pathology reports.  If you have any questions about what you read please let us know at your next visit so we can discuss your concerns and take corrective action if need be.  We are right here with you.

## 2021-05-27 ENCOUNTER — Encounter (INDEPENDENT_AMBULATORY_CARE_PROVIDER_SITE_OTHER): Payer: Self-pay | Admitting: Family Medicine

## 2021-05-27 ENCOUNTER — Other Ambulatory Visit: Payer: Self-pay

## 2021-05-27 ENCOUNTER — Ambulatory Visit (INDEPENDENT_AMBULATORY_CARE_PROVIDER_SITE_OTHER): Payer: Managed Care, Other (non HMO) | Admitting: Family Medicine

## 2021-05-27 VITALS — BP 121/68 | HR 71 | Temp 98.1°F | Ht 68.0 in | Wt 259.0 lb

## 2021-05-27 DIAGNOSIS — E1169 Type 2 diabetes mellitus with other specified complication: Secondary | ICD-10-CM

## 2021-05-27 DIAGNOSIS — Z6841 Body Mass Index (BMI) 40.0 and over, adult: Secondary | ICD-10-CM

## 2021-05-27 DIAGNOSIS — E559 Vitamin D deficiency, unspecified: Secondary | ICD-10-CM

## 2021-05-27 MED ORDER — VITAMIN D (ERGOCALCIFEROL) 1.25 MG (50000 UNIT) PO CAPS: 50000.0000 [IU] | ORAL_CAPSULE | ORAL | 0 refills | Status: DC

## 2021-05-27 NOTE — Progress Notes (Signed)
Chief Complaint:   OBESITY Brandon Rich is here to discuss his progress with his obesity treatment plan along with follow-up of his obesity related diagnoses. Brandon Rich is on the Category 4 Plan and keeping a food journal and adhering to recommended goals of 550-700 calories and 50 grams of protein at supper and states he is following his eating plan approximately 85% of the time. Brandon Rich states he is using a recumbent stationary bike for 30 minutes 5 times per week.  Today's visit was #: 11 Starting weight: 302 lbs Starting date: 12/04/2020 Today's weight: 259 lbs Today's date: 05/27/2021 Total lbs lost to date: 43 lbs Total lbs lost since last in-office visit: 1 lb  Interim History: Barre says he has gotten serious about exercise and is being consistent with this. However, he is exceeding snack calorie limits. He does not measure snack portions. He is consistent with adhering to plan at meals. He weighs/measures his meat and veg portions  Subjective:   1. Vitamin D deficiency Brandon Rich's Vitamin D was at goal (62.7). He is on weekly prescription Vitamin D.   Lab Results  Component Value Date   VD25OH 62.7 03/12/2021   VD25OH 41.3 12/04/2020    2. Type 2 diabetes mellitus with other specified complication, without long-term current use of insulin (HCC) Brandon Rich's Type 2 diabetes mellitus is well controlled. Levemir is down to 50 from 100 units since has lost weight. A1c also much improved. He is also on Jardiance and Ozempic. His fasting CBG's were in the range of 100-120. His 2 hour postprandial CBG range was 90-105. He denies hypoglycemia.  DM managed by Dr. Buddy Rich.  Lab Results  Component Value Date   HGBA1C 6.5 (H) 03/12/2021   HGBA1C 7.7 (H) 12/04/2020   HGBA1C 7.4 (H) 06/27/2020   Lab Results  Component Value Date   MICROALBUR 1.3 06/03/2016   LDLCALC 47 12/04/2020   CREATININE 0.85 03/12/2021   Lab Results  Component Value Date   INSULIN 5.2 12/04/2020     Assessment/Plan:    We will refill prescription Vitamin D 50,000 IU every week and Brandon Rich will follow-up for routine testing of Vitamin D, at least 2-3 times per year to avoid over-replacement. We will check Vitamin D at next office visit.   - Vitamin D, Ergocalciferol, (DRISDOL) 1.25 MG (50000 UNIT) CAPS capsule; Take 1 capsule (50,000 Units total) by mouth every 7 (seven) days.  Dispense: 4 capsule; Refill: 0  2. Type 2 diabetes mellitus with other specified complication, without long-term current use of insulin (HCC) We discussed possibly starting Mounjaro and I asked him to discuss with Dr. Buddy Rich who he will see in about a month. He will continue all current medications.   3. Obesity with current BMI of 39.39 Brandon Rich is currently in the action stage of change. As such, his goal is to continue with weight loss efforts. He has agreed to the Category 4 Plan and keeping a food journal and adhering to recommended goals of 550-700 calories and 50 grams of protein at supper.  Handouts: Holiday strategies and recipes were given today.   Exercise goals: Brandon Rich will add arm and abdominal resistance exercises 2-3 days per week. He will also work on increasing the tension on his recumbent bike.   Behavioral modification strategies: decreasing simple carbohydrates.  Brandon Rich has agreed to follow-up with our clinic in 3-4 weeks (fasting).  Objective:   Blood pressure 121/68, pulse 71, temperature 98.1 F (36.7 C), height 5\' 8"  (1.727 m), weight  259 lb (117.5 kg), SpO2 98 %. Body mass index is 39.38 kg/m.  General: Cooperative, alert, well developed, in no acute distress. HEENT: Conjunctivae and lids unremarkable. Cardiovascular: Regular rhythm.  Lungs: Normal work of breathing. Neurologic: No focal deficits.   Lab Results  Component Value Date   CREATININE 0.85 03/12/2021   BUN 22 03/12/2021   NA 138 03/12/2021   K 4.3 03/12/2021   CL 102 03/12/2021   CO2 21 03/12/2021   Lab  Results  Component Value Date   ALT 15 03/12/2021   AST 44 (H) 03/12/2021   ALKPHOS 82 03/12/2021   BILITOT 0.3 03/12/2021   Lab Results  Component Value Date   HGBA1C 6.5 (H) 03/12/2021   HGBA1C 7.7 (H) 12/04/2020   HGBA1C 7.4 (H) 06/27/2020   HGBA1C 9.3 (H) 12/02/2016   HGBA1C 8.5 (H) 06/03/2016   Lab Results  Component Value Date   INSULIN 5.2 12/04/2020   Lab Results  Component Value Date   TSH 1.550 12/04/2020   Lab Results  Component Value Date   CHOL 103 12/04/2020   HDL 30 (L) 12/04/2020   LDLCALC 47 12/04/2020   TRIG 147 12/04/2020   CHOLHDL 3.4 12/04/2020   Lab Results  Component Value Date   VD25OH 62.7 03/12/2021   VD25OH 41.3 12/04/2020   Lab Results  Component Value Date   WBC 11.4 (H) 12/04/2020   HGB 15.5 12/04/2020   HCT 47.5 12/04/2020   MCV 84 12/04/2020   PLT 280 12/04/2020   No results found for: IRON, TIBC, FERRITIN  Attestation Statements:   Reviewed by clinician on day of visit: allergies, medications, problem list, medical history, surgical history, family history, social history, and previous encounter notes.  I, Lizbeth Bark, RMA, am acting as Location manager for [employer], [place].   I have reviewed the above documentation for accuracy and completeness, and I agree with the above. -  Georgianne Fick, FNP

## 2021-06-01 ENCOUNTER — Encounter: Payer: Self-pay | Admitting: Family Medicine

## 2021-06-01 DIAGNOSIS — G4733 Obstructive sleep apnea (adult) (pediatric): Secondary | ICD-10-CM

## 2021-06-01 NOTE — Telephone Encounter (Signed)
Referral for [place]ley updated CPAP titration/adjustment.

## 2021-06-17 ENCOUNTER — Ambulatory Visit (INDEPENDENT_AMBULATORY_CARE_PROVIDER_SITE_OTHER): Payer: Managed Care, Other (non HMO) | Admitting: Family Medicine

## 2021-06-17 ENCOUNTER — Other Ambulatory Visit: Payer: Self-pay

## 2021-06-17 ENCOUNTER — Encounter (INDEPENDENT_AMBULATORY_CARE_PROVIDER_SITE_OTHER): Payer: Self-pay | Admitting: Family Medicine

## 2021-06-17 VITALS — BP 106/62 | HR 81 | Temp 98.2°F | Ht 68.0 in | Wt 262.0 lb

## 2021-06-17 DIAGNOSIS — Z6841 Body Mass Index (BMI) 40.0 and over, adult: Secondary | ICD-10-CM | POA: Diagnosis not present

## 2021-06-17 DIAGNOSIS — E559 Vitamin D deficiency, unspecified: Secondary | ICD-10-CM | POA: Diagnosis not present

## 2021-06-17 DIAGNOSIS — E1169 Type 2 diabetes mellitus with other specified complication: Secondary | ICD-10-CM | POA: Diagnosis not present

## 2021-06-17 DIAGNOSIS — Z9189 Other specified personal risk factors, not elsewhere classified: Secondary | ICD-10-CM

## 2021-06-17 MED ORDER — TIRZEPATIDE 2.5 MG/0.5ML ~~LOC~~ SOAJ: 2.5000 mg | SUBCUTANEOUS | Status: DC

## 2021-06-17 MED ORDER — VITAMIN D (ERGOCALCIFEROL) 1.25 MG (50000 UNIT) PO CAPS: 50000.0000 [IU] | ORAL_CAPSULE | ORAL | 0 refills | Status: DC

## 2021-06-17 NOTE — Progress Notes (Signed)
Chief Complaint:   OBESITY Brandon Rich is here to discuss his progress with his obesity treatment plan along with follow-up of his obesity related diagnoses. Brandon Rich is on the Category 4 Plan and keeping a food journal and adhering to recommended goals of 550-700 calories and 50 grams protein with supper and states he is following his eating plan approximately 75% of the time. Brandon Rich states he is using recumbent bike 30 minutes 4-5 times per week.  Today's visit was #: 12 Starting weight: 302 lbs Starting date: 12/04/2020 Today's weight: 262 lbs Today's date: 06/17/2021 Total lbs lost to date: 40 Total lbs lost since last in-office visit: +3  Interim History: Pt knows why he gained weight over the holidays, as it was challenging to stay on plan and he didn't exercise. He is back on plan. He has questions about blood sugar and insulin dosing and new diabetes medication he is on.  Subjective:   1. Type 2 diabetes mellitus with other specified complication, without long-term current use of insulin (HCC) Brandon Rich is managed by Dr. Buddy Duty. His fasting blood sugars run 100-110 and 2 hours post prandial 98-100.  2. Vitamin D deficiency He is currently taking prescription vitamin D 50,000 IU each week. He denies nausea, vomiting or muscle weakness.  3. At risk for hypoglycemia Brandon Rich is at increased risk for hypoglycemia due to weight loss and need to wean insulin.  Assessment/Plan:   Orders Placed This Encounter  Procedures   Comprehensive metabolic panel   Hemoglobin A1c   VITAMIN D 25 Hydroxy (Vit-D Deficiency, Fractures)    Medications Discontinued During This Encounter  Medication Reason   Semaglutide,0.25 or 0.5MG /DOS, 2 MG/1.5ML SOPN    Vitamin D, Ergocalciferol, (DRISDOL) 1.25 MG (50000 UNIT) CAPS capsule Reorder     Meds ordered this encounter  Medications   Vitamin D, Ergocalciferol, (DRISDOL) 1.25 MG (50000 UNIT) CAPS capsule    Sig: Take 1 capsule (50,000 Units  total) by mouth every 7 (seven) days.    Dispense:  4 capsule    Refill:  0   tirzepatide (MOUNJARO) 2.5 MG/0.5ML Pen    Sig: Inject 2.5 mg into the skin once a week.    Dispense:  2 mL     1. Type 2 diabetes mellitus with other specified complication, without long-term current use of insulin ([place]) Discontinue Ozempic. Start Mounjaro 2.5 mg (already has Rx). Check labs today.  Start- tirzepatide [facility]) 2.5 MG/0.5ML Pen; Inject 2.5 mg into the skin once a week.  Dispense: 2 mL   - Comprehensive metabolic panel - Hemoglobin A1c  2. Vitamin D deficiency Low Vitamin D level contributes to fatigue and are associated with obesity, breast, and colon cancer. He agrees to continue to take prescription Vitamin D 50,000 IU every week and will follow-up for routine testing of Vitamin D, at least 2-3 times per year to avoid over-replacement. Check labs today.  Refill- Vitamin D, Ergocalciferol, (DRISDOL) 1.25 MG (50000 UNIT) CAPS capsule; Take 1 capsule (50,000 Units total) by mouth every 7 (seven) days.  Dispense: 4 capsule; Refill: 0  - VITAMIN D 25 Hydroxy (Vit-D Deficiency, Fractures)  3. At risk for hypoglycemia Brandon Rich was given approximately 22 minutes of counseling today regarding prevention of hypoglycemia. He was advised of symptoms of hypoglycemia. Jag was instructed to avoid skipping meals, eat regular protein rich meals and schedule low calorie snacks as needed.   Repetitive spaced learning was employed today to elicit superior memory formation and behavioral change  4.  Obesity with current BMI of 39.9  Brandon Rich is currently in the action stage of change. As such, his goal is to continue with weight loss efforts. He has agreed to the Category 4 Plan and keeping a food journal and adhering to recommended goals of 550-700 calories and 50 grams protein with dinner.   Exercise goals: For substantial health benefits, adults should do at least 150 minutes (2 hours and 30 minutes) a  week of moderate-intensity, or 75 minutes (1 hour and 15 minutes) a week of vigorous-intensity aerobic physical activity, or an equivalent combination of moderate- and vigorous-intensity aerobic activity. Aerobic activity should be performed in episodes of at least 10 minutes, and preferably, it should be spread throughout the week.  Behavioral modification strategies: increasing lean protein intake and increasing water intake.  Brandon Rich has agreed to follow-up with our clinic in 2 weeks. He was informed of the importance of frequent follow-up visits to maximize his success with intensive lifestyle modifications for his multiple health conditions.   Brandon Rich was informed we would discuss his lab results at his next visit unless there is a critical issue that needs to be addressed sooner. Brandon Rich agreed to keep his next visit at the agreed upon time to discuss these results.  Objective:   Blood pressure 106/62, pulse 81, temperature 98.2 F (36.8 C), height 5\' 8"  (1.727 m), weight 262 lb (118.8 kg), SpO2 93 %. Body mass index is 39.84 kg/m.  General: Cooperative, alert, well developed, in no acute distress. HEENT: Conjunctivae and lids unremarkable. Cardiovascular: Regular rhythm.  Lungs: Normal work of breathing. Neurologic: No focal deficits.   Lab Results  Component Value Date   CREATININE 0.85 03/12/2021   BUN 22 03/12/2021   NA 138 03/12/2021   K 4.3 03/12/2021   CL 102 03/12/2021   CO2 21 03/12/2021   Lab Results  Component Value Date   ALT 15 03/12/2021   AST 44 (H) 03/12/2021   ALKPHOS 82 03/12/2021   BILITOT 0.3 03/12/2021   Lab Results  Component Value Date   HGBA1C 6.5 (H) 03/12/2021   HGBA1C 7.7 (H) 12/04/2020   HGBA1C 7.4 (H) 06/27/2020   HGBA1C 9.3 (H) 12/02/2016   HGBA1C 8.5 (H) 06/03/2016   Lab Results  Component Value Date   INSULIN 5.2 12/04/2020   Lab Results  Component Value Date   TSH 1.550 12/04/2020   Lab Results  Component Value Date   CHOL  103 12/04/2020   HDL 30 (L) 12/04/2020   LDLCALC 47 12/04/2020   TRIG 147 12/04/2020   CHOLHDL 3.4 12/04/2020   Lab Results  Component Value Date   VD25OH 62.7 03/12/2021   VD25OH 41.3 12/04/2020   Lab Results  Component Value Date   WBC 11.4 (H) 12/04/2020   HGB 15.5 12/04/2020   HCT 47.5 12/04/2020   MCV 84 12/04/2020   PLT 280 12/04/2020    Attestation Statements:   Reviewed by clinician on day of visit: allergies, medications, problem list, medical history, surgical history, family history, social history, and previous encounter notes.  Coral Ceo, CMA, am acting as transcriptionist for [employer], DO.  I have reviewed the above documentation for accuracy and completeness, and I agree with the above. Marjory Sneddon, D.O.  The [place] was signed into law in 2016 which includes the topic of electronic health records.  This provides immediate access to information in MyChart.  This includes consultation notes, operative notes, office notes, lab results  and pathology reports.  If you have any questions about what you read please let us know at your next visit so we can discuss your concerns and take corrective action if need be.  We are right here with you.

## 2021-06-18 ENCOUNTER — Ambulatory Visit (INDEPENDENT_AMBULATORY_CARE_PROVIDER_SITE_OTHER): Payer: Managed Care, Other (non HMO) | Admitting: Neurology

## 2021-06-18 ENCOUNTER — Encounter: Payer: Self-pay | Admitting: Neurology

## 2021-06-18 VITALS — BP 115/66 | HR 63 | Ht 68.0 in | Wt 267.0 lb

## 2021-06-18 DIAGNOSIS — G4733 Obstructive sleep apnea (adult) (pediatric): Secondary | ICD-10-CM

## 2021-06-18 DIAGNOSIS — Z9989 Dependence on other enabling machines and devices: Secondary | ICD-10-CM

## 2021-06-18 DIAGNOSIS — Z6841 Body Mass Index (BMI) 40.0 and over, adult: Secondary | ICD-10-CM

## 2021-06-18 DIAGNOSIS — R634 Abnormal weight loss: Secondary | ICD-10-CM | POA: Diagnosis not present

## 2021-06-18 LAB — COMPREHENSIVE METABOLIC PANEL
ALT: 19 IU/L (ref 0–44)
AST: 42 IU/L — ABNORMAL HIGH (ref 0–40)
Albumin/Globulin Ratio: 2 (ref 1.2–2.2)
Albumin: 4.7 g/dL (ref 3.8–4.8)
Alkaline Phosphatase: 82 IU/L (ref 44–121)
BUN/Creatinine Ratio: 23 (ref 10–24)
BUN: 23 mg/dL (ref 8–27)
Bilirubin Total: 0.4 mg/dL (ref 0.0–1.2)
CO2: 20 mmol/L (ref 20–29)
Calcium: 9.4 mg/dL (ref 8.6–10.2)
Chloride: 101 mmol/L (ref 96–106)
Creatinine, Ser: 1 mg/dL (ref 0.76–1.27)
Globulin, Total: 2.3 g/dL (ref 1.5–4.5)
Glucose: 72 mg/dL (ref 70–99)
Potassium: 4.2 mmol/L (ref 3.5–5.2)
Sodium: 140 mmol/L (ref 134–144)
Total Protein: 7 g/dL (ref 6.0–8.5)
eGFR: 86 mL/min/{1.73_m2} (ref >59)

## 2021-06-18 LAB — HEMOGLOBIN A1C
Est. average glucose Bld gHb Est-mCnc: 134 mg/dL
Hgb A1c MFr Bld: 6.3 % — ABNORMAL HIGH (ref 4.8–5.6)

## 2021-06-18 LAB — VITAMIN D 25 HYDROXY (VIT D DEFICIENCY, FRACTURES): Vit D, 25-Hydroxy: 49.8 ng/mL (ref 30.0–100.0)

## 2021-06-18 NOTE — Patient Instructions (Signed)
Thank you for choosing [facility] for your sleep related care! It was nice to meet you today! I appreciate that you entrust me with your sleep related healthcare concerns. I hope, I was able to address at least some of your concerns today, and that I can help you feel reassured and also get better.    Here is what we discussed today and what we came up with as our plan for you:    Based on your symptoms and your exam I believe you are still at risk for obstructive sleep apnea and would benefit from reevaluation as it has been many years and you need new supplies and an updated machine. Therefore, I think we should proceed with a sleep study to determine how severe your sleep apnea is. If you have more than mild OSA, I want you to consider ongoing treatment with CPAP. Please remember, the risks and ramifications of moderate to severe obstructive sleep apnea or OSA are: Cardiovascular disease, including congestive heart failure, stroke, difficult to control hypertension, arrhythmias, and even type 2 diabetes has been linked to untreated OSA. Sleep apnea causes disruption of sleep and sleep deprivation in most cases, which, in turn, can cause recurrent headaches, problems with memory, mood, concentration, focus, and vigilance. Most people with untreated sleep apnea report excessive daytime sleepiness, which can affect their ability to drive. Please do not drive if you feel sleepy.   I will likely see you back after your sleep study to go over the test results and where to go from there. We will call you after your sleep study to advise about the results (most likely, you will hear from Kristen, my nurse) and to set up an appointment at the time, as necessary.    Our sleep lab administrative assistant will call you to schedule your sleep study. If you don't hear back from her by about 2 weeks from now, please feel free to call her at [phone]. You can leave a message with your phone  number and concerns, if you get the voicemail box. She will call back as soon as possible.     

## 2021-06-18 NOTE — Progress Notes (Signed)
Subjective:    Patient ID: Brandon Rich is a 60 y.o. male.  HPI    Star Age, MD, PhD [facility] [address], [address] [place], [place] [address]  Dear Dr. Carlota Raspberry,   I saw your patient, Brandon Rich, upon your kind request, in my Sleep clinic today for initial consultation of his sleep disorder, in particular, evaluation of his prior diagnosis of obstructive sleep apnea.  The patient is unaccompanied today.  As you know, Brandon Rich is a 60 year old right-handed gentleman with an underlying medical history of diabetes, glaucoma, hypertension, hyperlipidemia, LVH, palpitations, history of congestive heart failure, allergies, anxiety, degenerative cervical disc disease, depression, reflux disease, elevated liver enzymes, and obesity, who was previously diagnosed with obstructive sleep apnea and placed on positive airway pressure treatment.  Prior sleep study results are not available for my review today, testing was several years ago, around 2003.  He reports having had a sleep study at [facility].  I was able to review his compliance data from the past 30 days.  He has been 100% compliance, average usage of 7 hours and 44 minutes, residual AHI at goal at 0.5/h, pressure range of 9 to 14 cm with EPR of 1, average pressure for the 95th percentile at 13.2 cm, leak low, nearly absent.  He uses a P 10 nasal pillows interface and has done well with it, started off with a nasal mask some years ago.  He has not gone without treatment within the past nearly 20 years for more than a handful of times.  This is his second machine and he purchased it out-of-pocket and gets his supplies online. He has been working on weight loss and has achieved a significant amount of weight reduction.  He has not had a reevaluation of his sleep apnea in years or follow-up with a sleep specialist.  His Epworth sleepiness score is 2 out of 24, fatigue severity score is 26 out  of 63.  He is going to have left foot surgery next month.  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  He is diabetes control has improved over time.  Bedtime is currently between 9 and 9:30 PM and rise time between 5 and 7 most of the mornings.  He has been working from home, works as an Chief Financial Officer, on [employer].  He is a non-smoker and drinks alcohol very rarely, caffeine in limitation, 1 cup of coffee generally once a day.  He lives with his wife, they have 1 dog in the household, they have 2 grown children, ages 71 and 27.  As he recalls, his sleep apnea was severe at the time, he remembers 40+ events per hour.  From his description, he had a split-night sleep study at the time.  He would be willing to get reevaluated.  He is probably eligible for new machine as his current machine is about 18 to 61 years old.  His Past Medical History Is Significant For: Past Medical History:  Diagnosis Date   Acute combined systolic and diastolic heart failure ([place]) 02/16/2016   Allergy    Anxiety    Phreesia 12/27/2019   Anxiety    DDD (degenerative disc disease), cervical    Depression    Diabetes mellitus    Diabetes mellitus without complication ([facility])    Phreesia 12/27/2019   Diabetic foot ulcer (HCC)    FUO (fever of unknown origin) 02/16/2016   Glaucoma  Heartburn    Hyperlipidemia    Hypertension    LVH (left ventricular hypertrophy) 03/29/2016   Palpitation    Rash and nonspecific skin eruption 02/16/2016   Sleep apnea    CPAP   SOBOE (shortness of breath on exertion)    Transaminitis 02/16/2016    His Past Surgical History Is Significant For: Past Surgical History:  Procedure Laterality Date   AMPUTATION TOE  04/2020   AMPUTATION TOE  01/2020   HERNIA REPAIR     SPINE SURGERY     L4/L5   TUMOR REMOVAL  12/1998   Bone Cyst    His Family History Is Significant For: Family History  Problem Relation Age of Onset   Skin cancer Mother    Cancer Mother     Heart disease Mother        Atrial Fib   Hypertension Mother    Hyperlipidemia Mother    Obesity Mother    Cancer Father    Heart disease Father        Valve Disease   Heart disease Maternal Grandmother    Hyperlipidemia Maternal Grandmother    Hypertension Maternal Grandmother    Heart disease Maternal Grandfather    Heart disease Paternal Grandfather    Hypertension Paternal Grandfather    Hyperlipidemia Paternal Grandfather    Sleep apnea Neg Hx     His Social History Is Significant For: Social History   Socioeconomic History   Marital status: Married    Spouse name: Jonahtan Manseau   Number of children: 2   Years of education: 16   Highest education level: Not on file  Occupational History   Occupation: Tour manager  Tobacco Use   Smoking status: Never   Smokeless tobacco: Never  Vaping Use   Vaping Use: Never used  Substance and Sexual Activity   Alcohol use: No    Alcohol/week: 0.0 standard drinks    Comment: per pt 1 drink once in a long while   Drug use: No   Sexual activity: Yes    Partners: Female  Other Topics Concern   Not on file  Social History Narrative   Married. Education: [employer].    Social Determinants of Health   Financial Resource Strain: Not on file  Food Insecurity: Not on file  Transportation Needs: Not on file  Physical Activity: Not on file  Stress: Not on file  Social Connections: Not on file    His Allergies Are:  Allergies  Allergen Reactions   Other Other (See Comments)    Hayfever  :   His Current Medications Are:  Outpatient Encounter Medications as of 06/18/2021  Medication Sig   Ascorbic Acid (VITAMIN C) 1000 MG tablet Take 1,000 mg by mouth daily.   aspirin 81 MG tablet Take 81 mg by mouth daily.   atorvastatin (LIPITOR) 20 MG tablet Take 1 tablet (20 mg total) by mouth daily.   BD PEN NEEDLE NANO U/F 32G X 4 MM MISC    calcium carbonate (TUMS - DOSED IN MG ELEMENTAL CALCIUM) 500 MG chewable tablet Chew 2  tablets by mouth daily as needed for indigestion or heartburn.   dorzolamide-timolol (COSOPT) 22.3-6.8 MG/ML ophthalmic solution 1 drop 2 (two) times daily.   empagliflozin (JARDIANCE) 25 MG TABS tablet Take 25 mg by mouth daily.   fexofenadine (ALLEGRA) 180 MG tablet Take 180 mg by mouth daily.   fluticasone (FLONASE) 50 MCG/ACT nasal spray PLACE 2 SPRAYS INTO BOTH NOSTRILS DAILY.   hydrOXYzine (ATARAX/VISTARIL)  25 MG tablet Take 0.5-1 tablets (12.5-25 mg total) by mouth at bedtime as needed for anxiety.   Insulin Detemir (LEVEMIR FLEXPEN [place]) Inject 50 Units into the skin daily.   lisinopril (ZESTRIL) 40 MG tablet Take 1 tablet (40 mg total) by mouth daily.   metFORMIN (GLUCOPHAGE) 1000 MG tablet TAKE ONE TABLET BY MOUTH TWICE A DAY WITH A MEAL   Multiple Vitamin (MULTIVITAMIN) tablet Take 1 tablet by mouth every evening.    sertraline (ZOLOFT) 100 MG tablet Take 1 tablet (100 mg total) by mouth daily.   tirzepatide [facility]) 2.5 MG/0.5ML Pen Inject 2.5 mg into the skin once a week.   Travoprost, BAK Free, (TRAVATAN) 0.004 % SOLN ophthalmic solution SMARTSIG:In Eye(s)   Vitamin D, Ergocalciferol, (DRISDOL) 1.25 MG (50000 UNIT) CAPS capsule Take 1 capsule (50,000 Units total) by mouth every 7 (seven) days.   Cholecalciferol (VITAMIN D) 125 MCG (5000 UT) CAPS Take 1 capsule by mouth daily.   No facility-administered encounter medications on file as of 06/18/2021.  :   Review of Systems:  Out of a complete 14 point review of systems, all are reviewed and negative with the exception of these symptoms as listed below:  Review of Systems  Neurological:        Pt is here for sleep consult . Pt states he had a sleep study in 2003 . Pt states the current CPAP he brought for appointment he has had for 8-10 years. Pt states he doesn't have fatigue, snoring ,or headaches due to being on CPAP. Pt states he has lost 45 lbs in the last six months he thinks he needs his pressure changed on CPAP    ESS:2 FSS:26   Objective:  Neurological Exam  Physical Exam Physical Examination:   Vitals:   06/18/21 1056  BP: 115/66  Pulse: 63    General Examination: The patient is a very pleasant 60 y.o. male in no acute distress. He appears well-developed and well-nourished and well groomed.   HEENT: Normocephalic, atraumatic, pupils are equal, round and reactive to light, extraocular tracking is good without limitation to gaze excursion or nystagmus noted. Hearing is grossly intact. Face is symmetric with normal facial animation. Speech is clear with no dysarthria noted. There is no hypophonia. There is no lip, neck/head, jaw or voice tremor. Neck is supple with full range of passive and active motion. There are no carotid bruits on auscultation. Oropharynx exam reveals: mild mouth dryness, good dental hygiene and moderate airway crowding, due to redundant soft palate, Mallampati class II, neck circumference of 20 1/4 inches.  Tongue protrudes centrally and palate elevates symmetrically.  Chest: Clear to auscultation without wheezing, rhonchi or crackles noted.  Heart: S1+S2+0, regular and normal without murmurs, rubs or gallops noted.   Abdomen: Soft, non-tender and non-distended with normal bowel sounds appreciated on auscultation.  Extremities: There is no pitting edema in the distal lower extremities bilaterally.   Skin: Warm and dry without trophic changes noted.   Musculoskeletal: exam reveals no obvious joint deformities, did not examine feet.   Neurologically:  Mental status: The patient is awake, alert and oriented in all 4 spheres. His immediate and remote memory, attention, language skills and fund of knowledge are appropriate. There is no evidence of aphasia, agnosia, apraxia or anomia. Speech is clear with normal prosody and enunciation. Thought process is linear. Mood is normal and affect is normal.  Cranial nerves II - XII are as described above under HEENT exam.  Motor  exam:  Normal bulk, strength and tone is noted. There is no tremor, fine motor skills and coordination: grossly intact.  Cerebellar testing: No dysmetria or intention tremor. There is no truncal or gait ataxia.  Sensory exam: intact to light touch in the upper and lower extremities.  Gait, station and balance: He stands easily. No veering to one side is noted. No leaning to one side is noted. Posture is age-appropriate and stance is narrow based. Gait shows normal stride length and normal pace. No problems turning are noted.   Assessment and Plan:  In summary, Brandon Rich is a very pleasant 60 y.o.-year old male with an underlying medical history of diabetes, glaucoma, hypertension, hyperlipidemia, LVH, palpitations, history of congestive heart failure, allergies, anxiety, degenerative cervical disc disease, depression, reflux disease, elevated liver enzymes, and obesity, who presents for evaluation of his obstructive sleep apnea which was likely in the severe range when he was originally diagnosed some 19 years ago.  He has been on AutoPap therapy for years with good success and full compliance.  He is commended for his treatment adherence and also his weight loss success.  He is advised to continue to work on weight loss and continue with his current AutoPap therapy at the current settings.  I do not believe he would benefit from a pressure change at this time, apnea scores are good and ranges of his pressures are good, averaging around 13 cm most nights.  He is advised to get reevaluated with a home sleep test so we know where he stands as he does have a significant amount of weight loss to report.  He is willing to proceed with a home sleep test and we will reconvene after.  He should qualify for new equipment through his insurance and ongoing supplies also covered by his insurance.  We will pick up our discussion after testing, we will keep him posted as to his test results by phone call in the  interim as well.  If he is able to start treatment with a new AutoPap machine, he will need a follow-up within 31 to 89 days of his set up.  We will plan his home sleep test after the first of the year as he has scheduled surgery which he would like to pursue first, understandably.  I answered all his questions today and he was in agreement with our plan.  Thank you very much for allowing me to participate in the care of this nice patient. If I can be of any further assistance to you please do not hesitate to call me at [phone].  Sincerely,   Star Age, MD, PhD

## 2021-06-24 ENCOUNTER — Telehealth: Payer: Self-pay | Admitting: Neurology

## 2021-06-24 NOTE — Telephone Encounter (Signed)
LVM for pt to call me back to schedule sleep study  

## 2021-06-29 ENCOUNTER — Telehealth: Payer: Self-pay | Admitting: Neurology

## 2021-06-29 NOTE — Telephone Encounter (Signed)
Returned pt's call and LVM for pt to call me back to schedule sleep study ° °

## 2021-07-01 ENCOUNTER — Other Ambulatory Visit: Payer: Self-pay

## 2021-07-01 ENCOUNTER — Encounter (INDEPENDENT_AMBULATORY_CARE_PROVIDER_SITE_OTHER): Payer: Self-pay | Admitting: Family Medicine

## 2021-07-01 ENCOUNTER — Ambulatory Visit (INDEPENDENT_AMBULATORY_CARE_PROVIDER_SITE_OTHER): Payer: Managed Care, Other (non HMO) | Admitting: Family Medicine

## 2021-07-01 VITALS — BP 122/68 | HR 59 | Temp 97.9°F | Ht 68.0 in | Wt 259.0 lb

## 2021-07-01 DIAGNOSIS — E559 Vitamin D deficiency, unspecified: Secondary | ICD-10-CM | POA: Diagnosis not present

## 2021-07-01 DIAGNOSIS — K760 Fatty (change of) liver, not elsewhere classified: Secondary | ICD-10-CM | POA: Diagnosis not present

## 2021-07-01 DIAGNOSIS — E669 Obesity, unspecified: Secondary | ICD-10-CM

## 2021-07-01 DIAGNOSIS — E1169 Type 2 diabetes mellitus with other specified complication: Secondary | ICD-10-CM

## 2021-07-01 DIAGNOSIS — Z794 Long term (current) use of insulin: Secondary | ICD-10-CM

## 2021-07-01 DIAGNOSIS — Z6839 Body mass index (BMI) 39.0-39.9, adult: Secondary | ICD-10-CM

## 2021-07-01 DIAGNOSIS — Z6841 Body Mass Index (BMI) 40.0 and over, adult: Secondary | ICD-10-CM

## 2021-07-01 DIAGNOSIS — Z9189 Other specified personal risk factors, not elsewhere classified: Secondary | ICD-10-CM

## 2021-07-01 MED ORDER — VITAMIN D (ERGOCALCIFEROL) 1.25 MG (50000 UNIT) PO CAPS: 50000.0000 [IU] | ORAL_CAPSULE | ORAL | 0 refills | Status: DC

## 2021-07-01 MED ORDER — TIRZEPATIDE 5 MG/0.5ML ~~LOC~~ SOAJ: 5.0000 mg | SUBCUTANEOUS | 0 refills | Status: DC

## 2021-07-02 NOTE — Progress Notes (Signed)
Chief Complaint:   OBESITY Brandon Rich is here to discuss his progress with his obesity treatment plan along with follow-up of his obesity related diagnoses. Brandon Rich is on the Category 4 Plan and keeping a food journal and adhering to recommended goals of 550-700 calories and 50 grams of protein at supper daily and states he is following his eating plan approximately 85% of the time. Brandon Rich states he is on the recumbent bike and using hand weights for 30 minutes 3-4 times per week.  Today's visit was #: 23 Starting weight: 302 lbs Starting date: 12/04/2020 Today's weight: 259 lbs Today's date: 07/01/2021 Total lbs lost to date: 43 Total lbs lost since last in-office visit: 3  Interim History: Brandon Rich lost the 3 pounds that he gained over Thanksgiving/last office visit. He is really focused on sticking to his eating plan more these past couple of weeks. He is here to review labs that were drawn after his last office visit.  Subjective:   1. Type 2 diabetes mellitus with other specified complication, with long-term current use of insulin ([place]) Dr. Buddy Duty gave Brandon Rich script, and he has been on it since mid November. He denies his insulin to 40 units 1 week ago. His 2 hour post prandial is 113 on an average, and his fasting blood sugar is 113 on an average. His A1c is improving. He is tolerating his medication with no side effects. I discussed labs with the patient today.  2. Vitamin D deficiency Brandon Rich's Vit D level is at goal. He is currently taking prescription vitamin D 50,000 IU each week. He denies nausea, vomiting or muscle weakness. I discussed labs with the patient today.  3. NAFLD (nonalcoholic fatty liver disease) Brandon Rich's CMP has improved from prior with weight loss and his prudent nutritional plan. I discussed labs with the patient today.  4. At risk for hypoglycemia Brandon Rich is at increased risk for hypoglycemia due increase in Mounjaro dose.  Assessment/Plan:   No orders of the defined types were placed in this encounter.   Medications Discontinued During This Encounter  Medication Reason   tirzepatide [facility]) 2.5 MG/0.5ML Pen    Vitamin D, Ergocalciferol, (DRISDOL) 1.25 MG (50000 UNIT) CAPS capsule Reorder     Meds ordered this encounter  Medications   Vitamin D, Ergocalciferol, (DRISDOL) 1.25 MG (50000 UNIT) CAPS capsule    Sig: Take 1 capsule (50,000 Units total) by mouth every 7 (seven) days.    Dispense:  4 capsule    Refill:  0   tirzepatide (MOUNJARO) 5 MG/0.5ML Pen    Sig: Inject 5 mg into the skin once a week.    Dispense:  2 mL    Refill:  0     1. Type 2 diabetes mellitus with other specified complication, with long-term current use of insulin ([facility]) [name] agreed to discontinue Mounjaro 2.5 mg, and he agreed to start Mounjaro 5 mg weekly with no refills. [name] understands how to ween insulin, and he will keep Dr. Buddy Duty in the loop. Good blood sugar control is important to decrease the likelihood of diabetic complications such as nephropathy, neuropathy, limb loss, blindness, coronary artery disease, and death. Intensive lifestyle modification including diet, exercise and weight loss are the first line of treatment for diabetes.   - tirzepatide [facility]) 5 MG/0.5ML Pen; Inject 5 mg into the skin once a week.  Dispense: 2 mL; Refill: 0  2. Vitamin D deficiency Brandon Rich will continue his current treatment plan. We will refill  prescription Vitamin D for 1 month. He will follow-up for routine testing of Vitamin D, at least 2-3 times per year to avoid over-replacement.  - Vitamin D, Ergocalciferol, (DRISDOL) 1.25 MG (50000 UNIT) CAPS capsule; Take 1 capsule (50,000 Units total) by mouth every 7 (seven) days.  Dispense: 4 capsule; Refill: 0  3. NAFLD (nonalcoholic fatty liver disease) Brandon Rich agreed to continue with his weight loss efforts with healthier diet and exercise as an essential part of his treatment plan.  4. At risk  for hypoglycemia Brandon Rich was given approximately 13 minutes of counseling today regarding prevention of hypoglycemia. He was advised of symptoms of hypoglycemia. Brandon Rich was instructed to avoid skipping meals, eat regular protein rich meals and schedule low calorie snacks as needed.   Repetitive spaced learning was employed today to elicit superior memory formation and behavioral change  5. Obesity with current BMI of 39.5 Brandon Rich is currently in the action stage of change. As such, his goal is to continue with weight loss efforts. He has agreed to the Category 4 Plan and keeping a food journal and adhering to recommended goals of 550-700 calories and 50 grams of protein at supper daily.   Exercise goals: For substantial health benefits, adults should do at least 150 minutes (2 hours and 30 minutes) a week of moderate-intensity, or 75 minutes (1 hour and 15 minutes) a week of vigorous-intensity aerobic physical activity, or an equivalent combination of moderate- and vigorous-intensity aerobic activity. Aerobic activity should be performed in episodes of at least 10 minutes, and preferably, it should be spread throughout the week.  Behavioral modification strategies: increasing lean protein intake, decreasing simple carbohydrates, and avoiding temptations.  Brandon Rich has agreed to follow-up with our clinic in 3 weeks. He was informed of the importance of frequent follow-up visits to maximize his success with intensive lifestyle modifications for his multiple health conditions.   Objective:   Blood pressure 122/68, pulse (!) 59, temperature 97.9 F (36.6 C), height 5\' 8"  (1.727 m), weight 259 lb (117.5 kg), SpO2 98 %. Body mass index is 39.38 kg/m.  General: Cooperative, alert, well developed, in no acute distress. HEENT: Conjunctivae and lids unremarkable. Cardiovascular: Regular rhythm.  Lungs: Normal work of breathing. Neurologic: No focal deficits.   Lab Results  Component Value Date    CREATININE 1.00 06/17/2021   BUN 23 06/17/2021   NA 140 06/17/2021   K 4.2 06/17/2021   CL 101 06/17/2021   CO2 20 06/17/2021   Lab Results  Component Value Date   ALT 19 06/17/2021   AST 42 (H) 06/17/2021   ALKPHOS 82 06/17/2021   BILITOT 0.4 06/17/2021   Lab Results  Component Value Date   HGBA1C 6.3 (H) 06/17/2021   HGBA1C 6.5 (H) 03/12/2021   HGBA1C 7.7 (H) 12/04/2020   HGBA1C 7.4 (H) 06/27/2020   HGBA1C 9.3 (H) 12/02/2016   Lab Results  Component Value Date   INSULIN 5.2 12/04/2020   Lab Results  Component Value Date   TSH 1.550 12/04/2020   Lab Results  Component Value Date   CHOL 103 12/04/2020   HDL 30 (L) 12/04/2020   LDLCALC 47 12/04/2020   TRIG 147 12/04/2020   CHOLHDL 3.4 12/04/2020   Lab Results  Component Value Date   VD25OH 49.8 06/17/2021   VD25OH 62.7 03/12/2021   VD25OH 41.3 12/04/2020   Lab Results  Component Value Date   WBC 11.4 (H) 12/04/2020   HGB 15.5 12/04/2020   HCT 47.5 12/04/2020   MCV  84 12/04/2020   PLT 280 12/04/2020   No results found for: IRON, TIBC, FERRITIN  Attestation Statements:   Reviewed by clinician on day of visit: allergies, medications, problem list, medical history, surgical history, family history, social history, and previous encounter notes.   Wilhemena Durie, am acting as transcriptionist for [employer], DO.  I have reviewed the above documentation for accuracy and completeness, and I agree with the above. Marjory Sneddon, D.O.  The [place] was signed into law in 2016 which includes the topic of electronic health records.  This provides immediate access to information in MyChart.  This includes consultation notes, operative notes, office notes, lab results and pathology reports.  If you have any questions about what you read please let us know at your next visit so we can discuss your concerns and take corrective action if need be.  We are right here with you.

## 2021-07-03 ENCOUNTER — Ambulatory Visit (INDEPENDENT_AMBULATORY_CARE_PROVIDER_SITE_OTHER): Payer: Managed Care, Other (non HMO) | Admitting: Family Medicine

## 2021-07-03 ENCOUNTER — Encounter: Payer: Self-pay | Admitting: Family Medicine

## 2021-07-03 ENCOUNTER — Other Ambulatory Visit: Payer: Self-pay

## 2021-07-03 VITALS — BP 128/62 | HR 69 | Temp 98.3°F | Resp 18 | Ht 68.0 in | Wt 265.0 lb

## 2021-07-03 DIAGNOSIS — E1169 Type 2 diabetes mellitus with other specified complication: Secondary | ICD-10-CM | POA: Diagnosis not present

## 2021-07-03 DIAGNOSIS — F4323 Adjustment disorder with mixed anxiety and depressed mood: Secondary | ICD-10-CM | POA: Diagnosis not present

## 2021-07-03 DIAGNOSIS — F5104 Psychophysiologic insomnia: Secondary | ICD-10-CM | POA: Diagnosis not present

## 2021-07-03 DIAGNOSIS — Z125 Encounter for screening for malignant neoplasm of prostate: Secondary | ICD-10-CM

## 2021-07-03 DIAGNOSIS — E785 Hyperlipidemia, unspecified: Secondary | ICD-10-CM | POA: Diagnosis not present

## 2021-07-03 DIAGNOSIS — I152 Hypertension secondary to endocrine disorders: Secondary | ICD-10-CM

## 2021-07-03 DIAGNOSIS — Z0000 Encounter for general adult medical examination without abnormal findings: Secondary | ICD-10-CM

## 2021-07-03 DIAGNOSIS — Z130 Encounter for screening for diseases of the blood and blood-forming organs and certain disorders involving the immune mechanism: Secondary | ICD-10-CM

## 2021-07-03 DIAGNOSIS — F411 Generalized anxiety disorder: Secondary | ICD-10-CM

## 2021-07-03 DIAGNOSIS — I10 Essential (primary) hypertension: Secondary | ICD-10-CM

## 2021-07-03 DIAGNOSIS — E119 Type 2 diabetes mellitus without complications: Secondary | ICD-10-CM

## 2021-07-03 DIAGNOSIS — E1159 Type 2 diabetes mellitus with other circulatory complications: Secondary | ICD-10-CM

## 2021-07-03 DIAGNOSIS — Z794 Long term (current) use of insulin: Secondary | ICD-10-CM

## 2021-07-03 LAB — LIPID PANEL
Cholesterol: 105 mg/dL (ref 0–200)
HDL: 35.9 mg/dL — ABNORMAL LOW (ref >39.00)
LDL Cholesterol: 47 mg/dL (ref 0–99)
NonHDL: 68.94
Total CHOL/HDL Ratio: 3
Triglycerides: 110 mg/dL (ref 0.0–149.0)
VLDL: 22 mg/dL (ref 0.0–40.0)

## 2021-07-03 LAB — CBC WITH DIFFERENTIAL/PLATELET
Basophils Absolute: 0.1 10*3/uL (ref 0.0–0.1)
Basophils Relative: 1 % (ref 0.0–3.0)
Eosinophils Absolute: 0.4 10*3/uL (ref 0.0–0.7)
Eosinophils Relative: 4.1 % (ref 0.0–5.0)
HCT: 45.5 % (ref 39.0–52.0)
Hemoglobin: 14.9 g/dL (ref 13.0–17.0)
Lymphocytes Relative: 31.1 % (ref 12.0–46.0)
Lymphs Abs: 2.8 10*3/uL (ref 0.7–4.0)
MCHC: 32.8 g/dL (ref 30.0–36.0)
MCV: 82.5 fl (ref 78.0–100.0)
Monocytes Absolute: 0.6 10*3/uL (ref 0.1–1.0)
Monocytes Relative: 6.4 % (ref 3.0–12.0)
Neutro Abs: 5.2 10*3/uL (ref 1.4–7.7)
Neutrophils Relative %: 57.4 % (ref 43.0–77.0)
Platelets: 251 10*3/uL (ref 150.0–400.0)
RBC: 5.52 Mil/uL (ref 4.22–5.81)
RDW: 15.3 % (ref 11.5–15.5)
WBC: 9 10*3/uL (ref 4.0–10.5)

## 2021-07-03 LAB — PSA: PSA: 2.75 ng/mL (ref 0.10–4.00)

## 2021-07-03 MED ORDER — METFORMIN HCL 1000 MG PO TABS: ORAL_TABLET | ORAL | 1 refills | Status: DC

## 2021-07-03 MED ORDER — HYDROXYZINE HCL 25 MG PO TABS: 12.5000 mg | ORAL_TABLET | Freq: Every evening | ORAL | 1 refills | Status: DC | PRN

## 2021-07-03 MED ORDER — SERTRALINE HCL 100 MG PO TABS: 100.0000 mg | ORAL_TABLET | Freq: Every day | ORAL | 1 refills | Status: DC

## 2021-07-03 MED ORDER — ATORVASTATIN CALCIUM 20 MG PO TABS
20.0000 mg | ORAL_TABLET | Freq: Every day | ORAL | 1 refills | Status: DC
Start: 2021-07-03 — End: 2022-01-01

## 2021-07-03 MED ORDER — LISINOPRIL 40 MG PO TABS
40.0000 mg | ORAL_TABLET | Freq: Every day | ORAL | 3 refills | Status: DC
Start: 2021-07-03 — End: 2022-08-23

## 2021-07-03 NOTE — Progress Notes (Signed)
Subjective:  Patient ID: Brandon Rich, male    DOB: 1961/05/21  Age: 60 y.o. MRN: [id number]  CC:  Chief Complaint  Patient presents with   Annual Exam    Patient states he is here for a physical. Also a medication refill if needed.    HPI Brandon Rich presents for   Annual physical exam.   Followed by Dr. Buddy Duty for endocrinology for diabetes, Dr. Rexene Alberts with neurology, sleep medicine for sleep apnea, Dr. Raliegh Scarlet at weight management, Dr. Desiree Hane for glaucoma  Type 2 diabetes Followed by endocrinology, Dr. Buddy Duty.  Complicated by obesity, polyneuropathy, history of amputation of toe.  Insulin-dependent.  Treated with Jardiance, Levemir (down to 40 units), metformin, mounjaro  He is on ACE inhibitor and statin. no recent hypoglycemia.  Lab Results  Component Value Date   HGBA1C 6.3 (H) 06/17/2021   Obesity Followed by weight management as above, last visit November 30.  Starting weight of 302 in May, 262 at November 30 visit.  Visit #12. Goal of 100# wt loss.  Body mass index is 40.29 kg/m. Wt Readings from Last 3 Encounters:  07/03/21 265 lb (120.2 kg)  07/01/21 259 lb (117.5 kg)  06/18/21 267 lb (121.1 kg)   Hyperlipidemia: Lipitor 20 mg daily. No new myalgias.  Lab Results  Component Value Date   CHOL 103 12/04/2020   HDL 30 (L) 12/04/2020   LDLCALC 47 12/04/2020   TRIG 147 12/04/2020   CHOLHDL 3.4 12/04/2020   Lab Results  Component Value Date   ALT 19 06/17/2021   AST 42 (H) 06/17/2021   ALKPHOS 82 06/17/2021   BILITOT 0.4 06/17/2021   Hypertension: Lisinopril 40 mg daily Home readings: 120/60-70 Using CPAP nightly. Home study planned in January. Dr. Rexene Alberts. BP Readings from Last 3 Encounters:  07/03/21 128/62  07/01/21 122/68  06/18/21 115/66   Lab Results  Component Value Date   CREATININE 1.00 06/17/2021   Depression: Controlled on sertraline without any side effects.  Counseling 1-2 times per month - Heather McCain. Going well. Hydroxyzine  once per night effective for sleep.   Depression screen [facility] 2/9 07/03/2021 12/26/2020 12/04/2020 06/27/2020 12/28/2019  Decreased Interest 0 0 2 0 0  Down, Depressed, Hopeless 0 0 1 0 0  PHQ - 2 Score 0 0 3 0 0  Altered sleeping 0 1 1 - -  Tired, decreased energy 0 0 3 - -  Change in appetite 0 1 1 - -  Feeling bad or failure about yourself  0 0 1 - -  Trouble concentrating 0 0 1 - -  Moving slowly or fidgety/restless 0 0 1 - -  Suicidal thoughts 0 0 0 - -  PHQ-9 Score 0 2 11 - -  Difficult doing work/chores Not difficult at all - Somewhat difficult - -  Some recent data might be hidden    Cancer screen: Cologuard negative 12/14/2018 Lab Results  Component Value Date   PSA1 2.2 12/28/2019   PSA1 2.2 12/01/2018   PSA1 1.3 09/20/2017   PSA 1.17 01/09/2016   PSA 1.29 06/16/2015   PSA 1.08 06/06/2014  Father with hx of prostate CA. The natural history of prostate cancer and ongoing controversy regarding screening and potential treatment outcomes of prostate cancer has been discussed with the patient. The meaning of a false positive PSA and a false negative PSA has been discussed. He indicates understanding of the limitations of this screening test and wishes  to proceed with screening PSA  testing.   Immunization History  Administered Date(s) Administered   Influenza Split 04/04/2012, 04/14/2013, 03/25/2019   Influenza Whole 09/05/2017   Influenza,inj,Quad PF,6+ Mos 04/14/2013, 04/24/2014, 06/16/2015, 03/27/2016, 04/16/2017, 04/10/2018   Influenza-Unspecified 04/16/2017   PFIZER(Purple Top)SARS-COV-2 Vaccination 10/13/2019, 11/06/2019, 03/18/2020   Pneumococcal Conjugate-13 06/16/2015   Pneumococcal Polysaccharide-23 07/19/2006, 06/06/2014   Tdap 12/16/2016   Zoster Recombinat (Shingrix) 12/16/2018, 03/25/2019  Flu vaccine at [facility] in September Covid vaccine, s/p 2 boosters - last one in June.   No results found. Followed by optometry with history of glaucoma, cataracts  [facility] in [place].  Treated with 2 eyedrops.appt in past few months. Pressures improved.    Dental Every 4 months, appt this week.   Alcohol Rare glass of wine  Tobacco None.  Exercise Some - 19min per day, 5d per week recumbent bike.    History Patient Active Problem List   Diagnosis Date Noted   Class 3 severe obesity with serious comorbidity and body mass index (BMI) of 45.0 to 49.9 in adult [facility]) 16/04/9603   Nonalcoholic hepatosteatosis 54/03/8118   Other fatigue 12/04/2020   SOBOE (shortness of breath on exertion) 12/04/2020   Vitamin D deficiency 12/04/2020   Depression 12/04/2020   Chest discomfort 02/02/2019   Morbid obesity ([place]) 02/02/2019   Pain in right hand 02/15/2018   Trigger finger 02/15/2018   Cortical age-related cataract of both eyes 03/30/2017   Nuclear sclerotic cataract of both eyes 03/30/2017   Primary open angle glaucoma (POAG) of both eyes, indeterminate stage 03/30/2017   Type 2 diabetes mellitus without complication, without long-term current use of insulin ([place]) 03/30/2017   LVH (left ventricular hypertrophy) 03/29/2016   FUO (fever of unknown origin) 02/16/2016   Acute combined systolic and diastolic heart failure ([place]) 02/16/2016   Weight gain 02/16/2016   Weight loss 02/16/2016   Rash and nonspecific skin eruption 02/16/2016   DOE (dyspnea on exertion) 02/16/2016   T wave inversion in EKG 02/16/2016   Transaminitis 02/16/2016   Arthritis of big toe 08/23/2013   Pain of left great toe 08/23/2013   Hypertension associated with type 2 diabetes mellitus ([place]) 12/20/2011   Diabetes mellitus ([place]) 12/20/2011   Hyperlipidemia associated with type 2 diabetes mellitus ([place]) 12/20/2011   Glaucoma (increased eye pressure) 07/19/1996   Past Medical History:  Diagnosis Date   Acute combined systolic and diastolic heart failure ([place]) 02/16/2016   Allergy    Anxiety    Phreesia 12/27/2019   Anxiety    DDD (degenerative disc  disease), cervical    Depression    Diabetes mellitus    Diabetes mellitus without complication ([place])    Phreesia 12/27/2019   Diabetic foot ulcer ([facility])    FUO (fever of unknown origin) 02/16/2016   Glaucoma    Heartburn    Hyperlipidemia    Hypertension    LVH (left ventricular hypertrophy) 03/29/2016   Palpitation    Rash and nonspecific skin eruption 02/16/2016   Sleep apnea    CPAP   SOBOE (shortness of breath on exertion)    Transaminitis 02/16/2016   Past Surgical History:  Procedure Laterality Date   AMPUTATION TOE  04/2020   AMPUTATION TOE  01/2020   HERNIA REPAIR     SPINE SURGERY     L4/L5   TUMOR REMOVAL  12/1998   Bone Cyst   Allergies  Allergen Reactions   Other Other (See Comments)    Hayfever   Prior to Admission medications   Medication  Sig Start Date End Date Taking? Authorizing Provider  Ascorbic Acid (VITAMIN C) 1000 MG tablet Take 1,000 mg by mouth daily.   Yes [provider]  aspirin 81 MG tablet Take 81 mg by mouth daily.   Yes [provider]  atorvastatin (LIPITOR) 20 MG tablet Take 1 tablet (20 mg total) by mouth daily. 12/26/20  Yes [name], MD  BD PEN NEEDLE NANO U/F 32G X 4 MM MISC  07/04/18  Yes [provider]  calcium carbonate (TUMS - DOSED IN MG ELEMENTAL CALCIUM) 500 MG chewable tablet Chew 2 tablets by mouth daily as needed for indigestion or heartburn.   Yes [provider]  Cholecalciferol (VITAMIN D) 125 MCG (5000 UT) CAPS Take 1 capsule by mouth daily. 03/18/21  Yes Whitmire, Dawn W, FNP  dorzolamide-timolol (COSOPT) 22.3-6.8 MG/ML ophthalmic solution 1 drop 2 (two) times daily.   Yes [provider]  empagliflozin (JARDIANCE) 25 MG TABS tablet Take 25 mg by mouth daily. 12/16/16  Yes [name], MD  fexofenadine (ALLEGRA) 180 MG tablet Take 180 mg by mouth daily.   Yes [provider]  fluticasone (FLONASE) 50 MCG/ACT nasal spray PLACE 2 SPRAYS INTO BOTH NOSTRILS  DAILY. 07/09/14  Yes GuestBenn Moulder, MD  hydrOXYzine (ATARAX/VISTARIL) 25 MG tablet Take 0.5-1 tablets (12.5-25 mg total) by mouth at bedtime as needed for anxiety. 06/27/20  Yes [name], MD  Insulin Detemir (LEVEMIR FLEXPEN [place]) Inject 40 Units into the skin daily.   Yes [provider]  lisinopril (ZESTRIL) 40 MG tablet Take 1 tablet (40 mg total) by mouth daily. 06/27/20  Yes [name], MD  metFORMIN (GLUCOPHAGE) 1000 MG tablet TAKE ONE TABLET BY MOUTH TWICE A DAY WITH A MEAL 12/18/19  Yes [name], MD  Multiple Vitamin (MULTIVITAMIN) tablet Take 1 tablet by mouth every evening.    Yes [provider]  sertraline (ZOLOFT) 100 MG tablet Take 1 tablet (100 mg total) by mouth daily. 12/26/20  Yes [name], MD  tirzepatide [facility]) 5 MG/0.5ML Pen Inject 5 mg into the skin once a week. 07/01/21  Yes Opalski, Deborah, DO  Travoprost, BAK Free, (TRAVATAN) 0.004 % SOLN ophthalmic solution SMARTSIG:In Eye(s) 01/28/21  Yes [provider]  Vitamin D, Ergocalciferol, (DRISDOL) 1.25 MG (50000 UNIT) CAPS capsule Take 1 capsule (50,000 Units total) by mouth every 7 (seven) days. 07/01/21  Yes Mellody Dance, DO   Social History   Socioeconomic History   Marital status: Married    Spouse name: Gloyd Happ   Number of children: 2   Years of education: 16   Highest education level: Not on file  Occupational History   Occupation: Tour manager  Tobacco Use   Smoking status: Never   Smokeless tobacco: Never  Vaping Use   Vaping Use: Never used  Substance and Sexual Activity   Alcohol use: No    Alcohol/week: 0.0 standard drinks    Comment: per pt 1 drink once in a long while   Drug use: No   Sexual activity: Yes    Partners: Female  Other Topics Concern   Not on file  Social History Narrative   Married. Education: [employer].    Social Determinants of Health   Financial Resource Strain: Not on file  Food Insecurity: Not on  file  Transportation Needs: Not on file  Physical Activity: Not on file  Stress: Not on file  Social Connections: Not on file  Intimate Partner  Violence: Not on file    Review of Systems 13 point review of systems per patient health survey noted.  Negative other than as indicated above or in HPI.   Objective:   Vitals:   07/03/21 0758  BP: 128/62  Pulse: 69  Resp: 18  Temp: 98.3 F (36.8 C)  TempSrc: Temporal  SpO2: 99%  Weight: 265 lb (120.2 kg)  Height: 5\' 8"  (1.727 m)   Physical Exam Vitals reviewed.  Constitutional:      Appearance: He is well-developed.  HENT:     Head: Normocephalic and atraumatic.     Right Ear: External ear normal.     Left Ear: External ear normal.  Eyes:     Conjunctiva/sclera: Conjunctivae normal.     Pupils: Pupils are equal, round, and reactive to light.  Neck:     Thyroid: No thyromegaly.  Cardiovascular:     Rate and Rhythm: Normal rate and regular rhythm.     Heart sounds: Normal heart sounds.  Pulmonary:     Effort: Pulmonary effort is normal. No respiratory distress.     Breath sounds: Normal breath sounds. No wheezing.  Abdominal:     General: There is no distension.     Palpations: Abdomen is soft.     Tenderness: There is no abdominal tenderness.  Musculoskeletal:        General: No tenderness. Normal range of motion.     Cervical back: Normal range of motion and neck supple.  Lymphadenopathy:     Cervical: No cervical adenopathy.  Skin:    General: Skin is warm and dry.  Neurological:     Mental Status: He is alert and oriented to person, place, and time.     Deep Tendon Reflexes: Reflexes are normal and symmetric.  Psychiatric:        Behavior: Behavior normal.       Assessment & Plan:  Brandon Rich is a 60 y.o. male . Annual physical exam  - anticipatory guidance as below in AVS, screening labs above. Health maintenance items as above in HPI discussed/recommended as applicable.   Psychophysiological  insomnia - Plan: hydrOXYzine (ATARAX) 25 MG tablet Adjustment disorder with mixed anxiety and depressed mood - Plan: hydrOXYzine (ATARAX) 25 MG tablet Anxiety state - Plan: sertraline (ZOLOFT) 100 MG tablet  - stable with zoloft, counseling, hydroxyzine -continue same.  Hyperlipidemia, unspecified hyperlipidemia type - Plan: atorvastatin (LIPITOR) 20 MG tablet  -  Stable, tolerating current regimen. Medications refilled. Labs pending as above.   Essential hypertension - Plan: lisinopril (ZESTRIL) 40 MG tablet, atorvastatin (LIPITOR) 20 MG tablet, metFORMIN (GLUCOPHAGE) 1000 MG tablet  -Stable, continue lisinopril.  Home monitoring discussed with weight loss and if lower readings or orthostatic/hypotensive symptoms we will decrease his lisinopril to 20 mg daily.  Type 2 diabetes mellitus without complication, with long-term current use of insulin ([place]) - Plan: metFORMIN (GLUCOPHAGE) 1000 MG tablet  -Improving A1c with weight loss, now on lower dose insulin, continue follow-up with endocrinology, healthy weight and wellness  Screening for deficiency anemia - Plan: CBC with Differential/Platelet  Screening for prostate cancer - Plan: PSA Family history of prostate cancer check PSA.  Limitations, risks, discussion of PSA as above.  Meds ordered this encounter  Medications   lisinopril (ZESTRIL) 40 MG tablet    Sig: Take 1 tablet (40 mg total) by mouth daily.    Dispense:  90 tablet    Refill:  3   hydrOXYzine (ATARAX) 25 MG tablet  Sig: Take 0.5-1 tablets (12.5-25 mg total) by mouth at bedtime as needed for anxiety.    Dispense:  90 tablet    Refill:  1   atorvastatin (LIPITOR) 20 MG tablet    Sig: Take 1 tablet (20 mg total) by mouth daily.    Dispense:  90 tablet    Refill:  1   metFORMIN (GLUCOPHAGE) 1000 MG tablet    Sig: TAKE ONE TABLET BY MOUTH TWICE A DAY WITH A MEAL    Dispense:  180 tablet    Refill:  1   sertraline (ZOLOFT) 100 MG tablet    Sig: Take 1 tablet (100 mg  total) by mouth daily.    Dispense:  90 tablet    Refill:  1   Patient Instructions  Congratulations on the weight loss.  Keep up the great work.  I will check some labs today but recent labs look okay.  If you notice lower blood pressures at home, lightheadedness or dizziness, let me know and we will return to a lower dose of lisinopril.  No changes for now.  Follow-up in 6 months but let me know if there are questions sooner.  Preventive Care 10-10 Years Old, Male Preventive care refers to lifestyle choices and visits with your health care provider that can promote health and wellness. Preventive care visits are also called wellness exams. What can I expect for my preventive care visit? Counseling During your preventive care visit, your health care provider may ask about your: Medical history, including: Past medical problems. Family medical history. Current health, including: Emotional well-being. Home life and relationship well-being. Sexual activity. Lifestyle, including: Alcohol, nicotine or tobacco, and drug use. Access to firearms. Diet, exercise, and sleep habits. Safety issues such as seatbelt and bike helmet use. Sunscreen use. Work and work Statistician. Physical exam Your health care provider will check your: Height and weight. These may be used to calculate your BMI (body mass index). BMI is a measurement that tells if you are at a healthy weight. Waist circumference. This measures the distance around your waistline. This measurement also tells if you are at a healthy weight and may help predict your risk of certain diseases, such as type 2 diabetes and high blood pressure. Heart rate and blood pressure. Body temperature. Skin for abnormal spots. What immunizations do I need? Vaccines are usually given at various ages, according to a schedule. Your health care provider will recommend vaccines for you based on your age, medical history, and lifestyle or other factors,  such as travel or where you work. What tests do I need? Screening Your health care provider may recommend screening tests for certain conditions. This may include: Lipid and cholesterol levels. Diabetes screening. This is done by checking your blood sugar (glucose) after you have not eaten for a while (fasting). Hepatitis B test. Hepatitis C test. HIV (human immunodeficiency virus) test. STI (sexually transmitted infection) testing, if you are at risk. Lung cancer screening. Prostate cancer screening. Colorectal cancer screening. Talk with your health care provider about your test results, treatment options, and if necessary, the need for more tests. Follow these instructions at home: Eating and drinking  Eat a diet that includes fresh fruits and vegetables, whole grains, lean protein, and low-fat dairy products. Take vitamin and mineral supplements as recommended by your health care provider. Do not drink alcohol if your health care provider tells you not to drink. If you drink alcohol: Limit how much you have to 0-2 drinks a  day. Know how much alcohol is in your drink. In the [place] one drink equals one 12 oz bottle of beer (355 mL), one 5 oz glass of wine (148 mL), or one 1 oz glass of hard liquor (44 mL). Lifestyle Brush your teeth every morning and night with fluoride toothpaste. Floss one time each day. Exercise for at least 30 minutes 5 or more days each week. Do not use any products that contain nicotine or tobacco. These products include cigarettes, chewing tobacco, and vaping devices, such as e-cigarettes. If you need help quitting, ask your health care provider. Do not use drugs. If you are sexually active, practice safe sex. Use a condom or other form of protection to prevent STIs. Take aspirin only as told by your health care provider. Make sure that you understand how much to take and what form to take. Work with your health care provider to find out whether it is safe and  beneficial for you to take aspirin daily. Find healthy ways to manage stress, such as: Meditation, yoga, or listening to music. Journaling. Talking to a trusted person. Spending time with friends and family. Minimize exposure to UV radiation to reduce your risk of skin cancer. Safety Always wear your seat belt while driving or riding in a vehicle. Do not drive: If you have been drinking alcohol. Do not ride with someone who has been drinking. When you are tired or distracted. While texting. If you have been using any mind-altering substances or drugs. Wear a helmet and other protective equipment during sports activities. If you have firearms in your house, make sure you follow all gun safety procedures. What's next? Go to your health care provider once a year for an annual wellness visit. Ask your health care provider how often you should have your eyes and teeth checked. Stay up to date on all vaccines. This information is not intended to replace advice given to you by your health care provider. Make sure you discuss any questions you have with your health care provider. Document Revised: 12/31/2020 Document Reviewed: 12/31/2020 Elsevier Patient Education  2022 [place],   [name], MD [place] 07/03/21 8:52 AM

## 2021-07-03 NOTE — Patient Instructions (Signed)
Congratulations on the weight loss.  Keep up the great work.  I will check some labs today but recent labs look okay.  If you notice lower blood pressures at home, lightheadedness or dizziness, let me know and we will return to a lower dose of lisinopril.  No changes for now.  Follow-up in 6 months but let me know if there are questions sooner.  Preventive Care 67-61 Years Old, Male Preventive care refers to lifestyle choices and visits with your health care provider that can promote health and wellness. Preventive care visits are also called wellness exams. What can I expect for my preventive care visit? Counseling During your preventive care visit, your health care provider may ask about your: Medical history, including: Past medical problems. Family medical history. Current health, including: Emotional well-being. Home life and relationship well-being. Sexual activity. Lifestyle, including: Alcohol, nicotine or tobacco, and drug use. Access to firearms. Diet, exercise, and sleep habits. Safety issues such as seatbelt and bike helmet use. Sunscreen use. Work and work Statistician. Physical exam Your health care provider will check your: Height and weight. These may be used to calculate your BMI (body mass index). BMI is a measurement that tells if you are at a healthy weight. Waist circumference. This measures the distance around your waistline. This measurement also tells if you are at a healthy weight and may help predict your risk of certain diseases, such as type 2 diabetes and high blood pressure. Heart rate and blood pressure. Body temperature. Skin for abnormal spots. What immunizations do I need? Vaccines are usually given at various ages, according to a schedule. Your health care provider will recommend vaccines for you based on your age, medical history, and lifestyle or other factors, such as travel or where you work. What tests do I need? Screening Your health care  provider may recommend screening tests for certain conditions. This may include: Lipid and cholesterol levels. Diabetes screening. This is done by checking your blood sugar (glucose) after you have not eaten for a while (fasting). Hepatitis B test. Hepatitis C test. HIV (human immunodeficiency virus) test. STI (sexually transmitted infection) testing, if you are at risk. Lung cancer screening. Prostate cancer screening. Colorectal cancer screening. Talk with your health care provider about your test results, treatment options, and if necessary, the need for more tests. Follow these instructions at home: Eating and drinking  Eat a diet that includes fresh fruits and vegetables, whole grains, lean protein, and low-fat dairy products. Take vitamin and mineral supplements as recommended by your health care provider. Do not drink alcohol if your health care provider tells you not to drink. If you drink alcohol: Limit how much you have to 0-2 drinks a day. Know how much alcohol is in your drink. In the [place] one drink equals one 12 oz bottle of beer (355 mL), one 5 oz glass of wine (148 mL), or one 1 oz glass of hard liquor (44 mL). Lifestyle Brush your teeth every morning and night with fluoride toothpaste. Floss one time each day. Exercise for at least 30 minutes 5 or more days each week. Do not use any products that contain nicotine or tobacco. These products include cigarettes, chewing tobacco, and vaping devices, such as e-cigarettes. If you need help quitting, ask your health care provider. Do not use drugs. If you are sexually active, practice safe sex. Use a condom or other form of protection to prevent STIs. Take aspirin only as told by your health care provider. Make  sure that you understand how much to take and what form to take. Work with your health care provider to find out whether it is safe and beneficial for you to take aspirin daily. Find healthy ways to manage stress, such  as: Meditation, yoga, or listening to music. Journaling. Talking to a trusted person. Spending time with friends and family. Minimize exposure to UV radiation to reduce your risk of skin cancer. Safety Always wear your seat belt while driving or riding in a vehicle. Do not drive: If you have been drinking alcohol. Do not ride with someone who has been drinking. When you are tired or distracted. While texting. If you have been using any mind-altering substances or drugs. Wear a helmet and other protective equipment during sports activities. If you have firearms in your house, make sure you follow all gun safety procedures. What's next? Go to your health care provider once a year for an annual wellness visit. Ask your health care provider how often you should have your eyes and teeth checked. Stay up to date on all vaccines. This information is not intended to replace advice given to you by your health care provider. Make sure you discuss any questions you have with your health care provider. Document Revised: 12/31/2020 Document Reviewed: 12/31/2020 Elsevier Patient Education  [place].

## 2021-07-07 ENCOUNTER — Telehealth: Payer: Self-pay | Admitting: Neurology

## 2021-07-07 NOTE — Telephone Encounter (Signed)
We have attempted to call the patient 2 times to schedule sleep study. Patient has been unavailable at the phone numbers we have on file and has not returned our calls. If patient calls back we will schedule them for their sleep study. ° °

## 2021-07-21 ENCOUNTER — Ambulatory Visit (INDEPENDENT_AMBULATORY_CARE_PROVIDER_SITE_OTHER): Payer: Managed Care, Other (non HMO) | Admitting: Neurology

## 2021-07-21 DIAGNOSIS — G4733 Obstructive sleep apnea (adult) (pediatric): Secondary | ICD-10-CM

## 2021-07-21 DIAGNOSIS — R634 Abnormal weight loss: Secondary | ICD-10-CM

## 2021-07-21 DIAGNOSIS — Z9989 Dependence on other enabling machines and devices: Secondary | ICD-10-CM

## 2021-07-23 NOTE — Progress Notes (Signed)
° °  [facility]  HOME SLEEP TEST (Watch PAT) REPORT  STUDY DATE: 07/21/2021  DOB: [birth year]  MRN: [id number]  ORDERING CLINICIAN: Star Age, MD, PhD   REFERRING CLINICIAN: [name], MD   CLINICAL INFORMATION/HISTORY: 61 year old right-handed gentleman with an underlying medical history of diabetes, glaucoma, hypertension, hyperlipidemia, LVH, palpitations, history of congestive heart failure, allergies, anxiety, degenerative cervical disc disease, depression, reflux disease, elevated liver enzymes, and obesity, who was previously diagnosed with obstructive sleep apnea and placed on positive airway pressure treatment. He presents for reevaluation.  Epworth sleepiness score: 2/24.  BMI: 40.4 kg/m  FINDINGS:   Sleep Summary:   Total Recording Time (hours, min): 8 hours, 28 minutes  Total Sleep Time (hours, min):  6 hours, 51 minutes   Percent REM (%):    7.9%   Respiratory Indices:   Calculated pAHI (per hour):  65.7/hour         REM pAHI:    39/hour       NREM pAHI: 68/hour  Oxygen Saturation Statistics:    Oxygen Saturation (%) Mean: 91%   Minimum oxygen saturation (%):                 80%   O2 Saturation Range (%): 80-98%    O2 Saturation (minutes) <=88%: 38.6 min  Pulse Rate Statistics:   Pulse Mean (bpm):    62/min    Pulse Range (49-111/min)   IMPRESSION: OSA (obstructive sleep apnea), severe  RECOMMENDATION:  This home sleep test demonstrates severe obstructive sleep apnea with a total AHI of 65.7/hour and O2 nadir of 80% with significant time below or at 88% saturation of 38.6 minutes.  Ongoing treatment with positive airway pressure is highly recommended.  I will write for a new AutoPap machine. A laboratory attended titration study can be considered in the future for optimization of his treatment and better tolerance of therapy.  Alternative treatment options are limited secondary to the severity of the patient's sleep  disordered breathing.  Concomitant weight loss is recommended.  Please note, that untreated obstructive sleep apnea may carry additional perioperative morbidity. Patients with significant obstructive sleep apnea should receive perioperative PAP therapy and the surgeons and particularly the anesthesiologist should be informed of the diagnosis and the severity of the sleep disordered breathing. The patient should be cautioned not to drive, work at heights, or operate dangerous or heavy equipment when tired or sleepy. Review and reiteration of good sleep hygiene measures should be pursued with any patient. Other causes of the patient's symptoms, including circadian rhythm disturbances, an underlying mood disorder, medication effect and/or an underlying medical problem cannot be ruled out based on this test. Clinical correlation is recommended. The patient and his referring provider will be notified of the test results. The patient will be seen in follow up in sleep clinic at [facility].  I certify that I have reviewed the raw data recording prior to the issuance of this report in accordance with the standards of the American Academy of Sleep Medicine (AASM).   INTERPRETING PHYSICIAN:   Star Age, MD, PhD  Board Certified in Neurology and Sleep Medicine  [facility] [address] [place], [place] [address] [phone]

## 2021-07-28 NOTE — Procedures (Signed)
° °  [facility]  HOME SLEEP TEST (Watch PAT) REPORT  STUDY DATE: 07/21/2021  DOB: 10/02/1960  MRN: [id number]  ORDERING CLINICIAN: Star Age, MD, PhD   REFERRING CLINICIAN: [name], MD   CLINICAL INFORMATION/HISTORY: 61 year old right-handed gentleman with an underlying medical history of diabetes, glaucoma, hypertension, hyperlipidemia, LVH, palpitations, history of congestive heart failure, allergies, anxiety, degenerative cervical disc disease, depression, reflux disease, elevated liver enzymes, and obesity, who was previously diagnosed with obstructive sleep apnea and placed on positive airway pressure treatment. He presents for reevaluation.  Epworth sleepiness score: 2/24.  BMI: 40.4 kg/m  FINDINGS:   Sleep Summary:   Total Recording Time (hours, min): 8 hours, 28 minutes  Total Sleep Time (hours, min):  6 hours, 51 minutes   Percent REM (%):    7.9%   Respiratory Indices:   Calculated pAHI (per hour):  65.7/hour         REM pAHI:    39/hour       NREM pAHI: 68/hour  Oxygen Saturation Statistics:    Oxygen Saturation (%) Mean: 91%   Minimum oxygen saturation (%):                 80%   O2 Saturation Range (%): 80-98%    O2 Saturation (minutes) <=88%: 38.6 min  Pulse Rate Statistics:   Pulse Mean (bpm):    62/min    Pulse Range (49-111/min)   IMPRESSION: OSA (obstructive sleep apnea), severe  RECOMMENDATION:  This home sleep test demonstrates severe obstructive sleep apnea with a total AHI of 65.7/hour and O2 nadir of 80% with significant time below or at 88% saturation of 38.6 minutes.  Ongoing treatment with positive airway pressure is highly recommended.  I will write for a new AutoPap machine. A laboratory attended titration study can be considered in the future for optimization of his treatment and better tolerance of therapy.  Alternative treatment options are limited secondary to the severity of the patient's sleep  disordered breathing.  Concomitant weight loss is recommended.  Please note, that untreated obstructive sleep apnea may carry additional perioperative morbidity. Patients with significant obstructive sleep apnea should receive perioperative PAP therapy and the surgeons and particularly the anesthesiologist should be informed of the diagnosis and the severity of the sleep disordered breathing. The patient should be cautioned not to drive, work at heights, or operate dangerous or heavy equipment when tired or sleepy. Review and reiteration of good sleep hygiene measures should be pursued with any patient. Other causes of the patient's symptoms, including circadian rhythm disturbances, an underlying mood disorder, medication effect and/or an underlying medical problem cannot be ruled out based on this test. Clinical correlation is recommended. The patient and his referring provider will be notified of the test results. The patient will be seen in follow up in sleep clinic at [facility].  I certify that I have reviewed the raw data recording prior to the issuance of this report in accordance with the standards of the American Academy of Sleep Medicine (AASM).   INTERPRETING PHYSICIAN:   Star Age, MD, PhD  Board Certified in Neurology and Sleep Medicine  [facility] [address] [place], [place] [address] [phone]

## 2021-07-28 NOTE — Addendum Note (Signed)
Addended by: Star Age on: 07/28/2021 05:24 PM   Modules accepted: Orders

## 2021-07-30 ENCOUNTER — Other Ambulatory Visit: Payer: Self-pay

## 2021-07-30 ENCOUNTER — Ambulatory Visit (INDEPENDENT_AMBULATORY_CARE_PROVIDER_SITE_OTHER): Payer: Managed Care, Other (non HMO) | Admitting: Family Medicine

## 2021-07-30 ENCOUNTER — Encounter (INDEPENDENT_AMBULATORY_CARE_PROVIDER_SITE_OTHER): Payer: Self-pay | Admitting: Family Medicine

## 2021-07-30 ENCOUNTER — Telehealth: Payer: Self-pay | Admitting: *Deleted

## 2021-07-30 VITALS — BP 115/68 | HR 62 | Temp 97.8°F | Ht 68.0 in | Wt 265.0 lb

## 2021-07-30 DIAGNOSIS — E1169 Type 2 diabetes mellitus with other specified complication: Secondary | ICD-10-CM

## 2021-07-30 DIAGNOSIS — G4733 Obstructive sleep apnea (adult) (pediatric): Secondary | ICD-10-CM | POA: Diagnosis not present

## 2021-07-30 DIAGNOSIS — Z9989 Dependence on other enabling machines and devices: Secondary | ICD-10-CM

## 2021-07-30 DIAGNOSIS — Z6841 Body Mass Index (BMI) 40.0 and over, adult: Secondary | ICD-10-CM | POA: Diagnosis not present

## 2021-07-30 DIAGNOSIS — Z9189 Other specified personal risk factors, not elsewhere classified: Secondary | ICD-10-CM

## 2021-07-30 DIAGNOSIS — Z794 Long term (current) use of insulin: Secondary | ICD-10-CM

## 2021-07-30 MED ORDER — TIRZEPATIDE 7.5 MG/0.5ML ~~LOC~~ SOAJ: 7.5000 mg | SUBCUTANEOUS | 0 refills | Status: DC

## 2021-07-30 NOTE — Telephone Encounter (Signed)
Called pt & LVM (ok per DPR) asking for call back to discuss his sleep study results. Sleep study confirmed severe sleep apnea. Should qualify for new machine. We can send orders to DME of his choice. Asked for call back to discuss further. Left office number in message.

## 2021-07-30 NOTE — Telephone Encounter (Signed)
-----   Message from Star Age, MD sent at 07/28/2021  5:24 PM EST ----- Patient referred by Dr. Carlota Raspberry for reevaluation of his obstructive sleep apnea.  He should qualify for new equipment.  I saw him on 07/01/2021 and he had a home sleep test on 07/21/2021. Please call and notify the patient that the recent home sleep test showed obstructive sleep apnea in the severe range. I recommend ongoing treatment with AutoPap therapy and I would like to write for a new machine.  We can use a DME company of his liking.  Please also reinforce the need for compliance with treatment. We will need a FU in sleep clinic for 10 weeks post-PAP set up, please arrange that with me or one of our NPs. Thanks,   Star Age, MD, PhD [facility])

## 2021-08-03 NOTE — Progress Notes (Signed)
Chief Complaint:   OBESITY Brandon Rich is here to discuss his progress with his obesity treatment plan along with follow-up of his obesity related diagnoses. Brandon Rich is on the Category 4 Plan and keeping a food journal and adhering to recommended goals of 550-700 calories and 50 grams protein with supper and states he is following his eating plan approximately 50% of the time. Brandon Rich states he is not currently exercising.  Today's visit was #: 14 Starting weight: 302 lbs Starting date: 12/04/2020 Today's weight: 265 lbs Today's date: 07/30/2021 Total lbs lost to date: 37 Total lbs lost since last in-office visit: +6  Interim History: Brandon Rich pulled back on his exercise and had several days where he ate off plan. He was up 10 lbs after New Year's. He has been back on plan for a week and lost 4 lbs during that time.  Subjective:   1. Type 2 diabetes mellitus with other specified complication, with long-term current use of insulin (HCC) BS 2 hours post prandial has been in the 120's/high teens lately. He is tolerating Mounjaro 5 mg well and desires to increase dose.  2. OSA on CPAP Pt was seen by Dr. Rexene Alberts of neurology recently to reevaluate his OSA.  3. At risk for hypoglycemia Brandon Rich is at increased risk for hypoglycemia due to increasing dose of Mounjaro.  Assessment/Plan:  No orders of the defined types were placed in this encounter.   Medications Discontinued During This Encounter  Medication Reason   tirzepatide [facility]) 5 MG/0.5ML Pen      Meds ordered this encounter  Medications   tirzepatide (MOUNJARO) 7.5 MG/0.5ML Pen    Sig: Inject 7.5 mg into the skin once a week.    Dispense:  2 mL    Refill:  0     1. Type 2 diabetes mellitus with other specified complication, with long-term current use of insulin (Brandon Rich) Brandon Rich will increase Mounjaro to 7.5 mg (up from 5 mg). He will wean insulin as tolerated. Continue other meds.  Increase & Refill- tirzepatide  (MOUNJARO) 7.5 MG/0.5ML Pen; Inject 7.5 mg into the skin once a week.  Dispense: 2 mL; Refill: 0  2. OSA on CPAP Intensive lifestyle modifications are the first line treatment for this issue. We discussed several lifestyle modifications today and he will continue to work on diet, exercise and weight loss efforts. We will continue to monitor. Orders and follow up as documented in patient record. Follow up with neurology for further recommendations.  3. At risk for hypoglycemia Brandon Rich was given approximately 9 minutes of counseling today regarding prevention of hypoglycemia. He was advised of symptoms of hypoglycemia. Brandon Rich was instructed to avoid skipping meals, eat regular protein rich meals and schedule low calorie snacks as needed.   Repetitive spaced learning was employed today to elicit superior memory formation and behavioral change  4. Obesity with current BMI of 40.3  Brandon Rich is currently in the action stage of change. As such, his goal is to continue with weight loss efforts. He has agreed to the Category 4 Plan and keeping a food journal and adhering to recommended goals of 550-700 calories and 50 grams protein at supper.   Exercise goals: All adults should avoid inactivity. Some physical activity is better than none, and adults who participate in any amount of physical activity gain some health benefits. Start walking.  Behavioral modification strategies: meal planning and cooking strategies and planning for success.  Brandon Rich has agreed to follow-up with our clinic in 3  weeks. He was informed of the importance of frequent follow-up visits to maximize his success with intensive lifestyle modifications for his multiple health conditions.   Objective:   Blood pressure 115/68, pulse 62, temperature 97.8 F (36.6 C), height 5\' 8"  (1.727 m), weight 265 lb (120.2 kg), SpO2 98 %. Body mass index is 40.29 kg/m.  General: Cooperative, alert, well developed, in no acute distress. HEENT:  Conjunctivae and lids unremarkable. Cardiovascular: Regular rhythm.  Lungs: Normal work of breathing. Neurologic: No focal deficits.   Lab Results  Component Value Date   CREATININE 1.00 06/17/2021   BUN 23 06/17/2021   NA 140 06/17/2021   K 4.2 06/17/2021   CL 101 06/17/2021   CO2 20 06/17/2021   Lab Results  Component Value Date   ALT 19 06/17/2021   AST 42 (H) 06/17/2021   ALKPHOS 82 06/17/2021   BILITOT 0.4 06/17/2021   Lab Results  Component Value Date   HGBA1C 6.3 (H) 06/17/2021   HGBA1C 6.5 (H) 03/12/2021   HGBA1C 7.7 (H) 12/04/2020   HGBA1C 7.4 (H) 06/27/2020   HGBA1C 9.3 (H) 12/02/2016   Lab Results  Component Value Date   INSULIN 5.2 12/04/2020   Lab Results  Component Value Date   TSH 1.550 12/04/2020   Lab Results  Component Value Date   CHOL 105 07/03/2021   HDL 35.90 (L) 07/03/2021   LDLCALC 47 07/03/2021   TRIG 110.0 07/03/2021   CHOLHDL 3 07/03/2021   Lab Results  Component Value Date   VD25OH 49.8 06/17/2021   VD25OH 62.7 03/12/2021   VD25OH 41.3 12/04/2020   Lab Results  Component Value Date   WBC 9.0 07/03/2021   HGB 14.9 07/03/2021   HCT 45.5 07/03/2021   MCV 82.5 07/03/2021   PLT 251.0 07/03/2021    Attestation Statements:   Reviewed by clinician on day of visit: allergies, medications, problem list, medical history, surgical history, family history, social history, and previous encounter notes.  Coral Ceo, CMA, am acting as transcriptionist for [employer], DO.  I have reviewed the above documentation for accuracy and completeness, and I agree with the above. Marjory Sneddon, D.O.  The [place] was signed into law in 2016 which includes the topic of electronic health records.  This provides immediate access to information in MyChart.  This includes consultation notes, operative notes, office notes, lab results and pathology reports.  If you have any questions about what you read please let us  know at your next visit so we can discuss your concerns and take corrective action if need be.  We are right here with you.

## 2021-08-04 NOTE — Telephone Encounter (Signed)
Called pt a second time and LVM (ok per DPR) asking for call back to discuss his sleep study and the next steps to get him a new autopap machine. Left office number in message.

## 2021-08-06 ENCOUNTER — Telehealth: Payer: Self-pay | Admitting: *Deleted

## 2021-08-06 NOTE — Telephone Encounter (Signed)
-----   Message from Star Age, MD sent at 07/28/2021  5:24 PM EST ----- Patient referred by Dr. Carlota Raspberry for reevaluation of his obstructive sleep apnea.  He should qualify for new equipment.  I saw him on 07/01/2021 and he had a home sleep test on 07/21/2021. Please call and notify the patient that the recent home sleep test showed obstructive sleep apnea in the severe range. I recommend ongoing treatment with AutoPap therapy and I would like to write for a new machine.  We can use a DME company of his liking.  Please also reinforce the need for compliance with treatment. We will need a FU in sleep clinic for 10 weeks post-PAP set up, please arrange that with me or one of our NPs. Thanks,   Star Age, MD, PhD [facility])

## 2021-08-06 NOTE — Telephone Encounter (Signed)
I called pt and relayed the HST results.  He does have OSA severe range.  Dr. Rexene Alberts recommends autopap thru DME choice of his choosing.  He will check with insurance company and then let us know what DME (I gave [facility]) to send order to. I relayed that he is to see [place] back for insurance compliance appt 31-89 days after starting use of machine.  He verbalized understanding.  Will mychart [place] with message.

## 2021-08-19 ENCOUNTER — Other Ambulatory Visit: Payer: Self-pay

## 2021-08-19 ENCOUNTER — Ambulatory Visit (INDEPENDENT_AMBULATORY_CARE_PROVIDER_SITE_OTHER): Payer: Managed Care, Other (non HMO) | Admitting: Family Medicine

## 2021-08-19 ENCOUNTER — Encounter (INDEPENDENT_AMBULATORY_CARE_PROVIDER_SITE_OTHER): Payer: Self-pay | Admitting: Family Medicine

## 2021-08-19 VITALS — BP 117/67 | HR 64 | Temp 97.8°F | Ht 68.0 in | Wt 263.0 lb

## 2021-08-19 DIAGNOSIS — Z7985 Long-term (current) use of injectable non-insulin antidiabetic drugs: Secondary | ICD-10-CM

## 2021-08-19 DIAGNOSIS — E559 Vitamin D deficiency, unspecified: Secondary | ICD-10-CM

## 2021-08-19 DIAGNOSIS — Z6841 Body Mass Index (BMI) 40.0 and over, adult: Secondary | ICD-10-CM

## 2021-08-19 DIAGNOSIS — E669 Obesity, unspecified: Secondary | ICD-10-CM | POA: Diagnosis not present

## 2021-08-19 DIAGNOSIS — R632 Polyphagia: Secondary | ICD-10-CM

## 2021-08-19 DIAGNOSIS — E1169 Type 2 diabetes mellitus with other specified complication: Secondary | ICD-10-CM | POA: Diagnosis not present

## 2021-08-19 DIAGNOSIS — Z9189 Other specified personal risk factors, not elsewhere classified: Secondary | ICD-10-CM

## 2021-08-19 MED ORDER — TIRZEPATIDE 7.5 MG/0.5ML ~~LOC~~ SOAJ: 7.5000 mg | SUBCUTANEOUS | 0 refills | Status: DC

## 2021-08-19 MED ORDER — VITAMIN D (ERGOCALCIFEROL) 1.25 MG (50000 UNIT) PO CAPS: 50000.0000 [IU] | ORAL_CAPSULE | ORAL | 0 refills | Status: DC

## 2021-08-19 NOTE — Progress Notes (Signed)
Chief Complaint:   OBESITY Brandon Rich is here to discuss his progress with his obesity treatment plan along with follow-up of his obesity related diagnoses. Brandon Rich is on the Category 4 Plan and keeping a food journal and adhering to recommended goals of 550-700 calories and 50 grams of protein daily and states he is following his eating plan approximately 90% of the time. Brandon Rich states he is riding the stationary and recumbent bike for 30 minutes 2-3 times per week.  Today's visit was #: 15 Starting weight: 302 lbs Starting date: 12/04/2020 Today's weight: 263 lbs Today's date: 08/19/2021 Total lbs lost to date: 39 Total lbs lost since last in-office visit: 3  Interim History: Brandon Rich is disappointed that he didn't lose more. He feels he could have done better. His notes hunger between lunch and dinner, not eating his bread or wraps and usually having a fatty meats at lunch, 4 oz. Also not always calculating snacks and condiments.   Subjective:   1. Type 2 diabetes mellitus with other specified complication, with long-term current use of insulin (HCC) Cylan's fasting blood sugars ranges between 140's-150's. He is on Mounjaro 7.5 mg weekly. I discussed labs with the patient today.  2. Vitamin D deficiency Brandon Rich is currently taking prescription vitamin D 50,000 IU each week. He denies nausea, vomiting or muscle weakness. I discussed labs with the patient today.  3. Polyphagia Brandon Rich notes hunger between lunch and dinner. He is also skipping food. I discussed labs with the patient today.  4. At risk for impaired metabolic function Brandon Rich is at increased risk for impaired metabolic function due to current nutrition and muscle mass.    Assessment/Plan:   Medications Discontinued During This Encounter  Medication Reason   Vitamin D, Ergocalciferol, (DRISDOL) 1.25 MG (50000 UNIT) CAPS capsule Reorder   tirzepatide (MOUNJARO) 7.5 MG/0.5ML Pen Reorder     Meds ordered this  encounter  Medications   tirzepatide (MOUNJARO) 7.5 MG/0.5ML Pen    Sig: Inject 7.5 mg into the skin once a week.    Dispense:  2 mL    Refill:  0   Vitamin D, Ergocalciferol, (DRISDOL) 1.25 MG (50000 UNIT) CAPS capsule    Sig: Take 1 capsule (50,000 Units total) by mouth every 7 (seven) days.    Dispense:  4 capsule    Refill:  0     1. Type 2 diabetes mellitus with other specified complication, with long-term current use of insulin ([place]) Field will continue [place], and we will refill for 1 month at the same dose. Good blood sugar control is important to decrease the likelihood of diabetic complications such as nephropathy, neuropathy, limb loss, blindness, coronary artery disease, and death. Intensive lifestyle modification including diet, exercise and weight loss are the first line of treatment for diabetes.   - tirzepatide (MOUNJARO) 7.5 MG/0.5ML Pen; Inject 7.5 mg into the skin once a week.  Dispense: 2 mL; Refill: 0  2. Vitamin D deficiency We will refill prescription Vitamin D 50,000 IU every week for 1 month. Brandon Rich will follow-up for routine testing of Vitamin D, at least 2-3 times per year to avoid over-replacement.  - Vitamin D, Ergocalciferol, (DRISDOL) 1.25 MG (50000 UNIT) CAPS capsule; Take 1 capsule (50,000 Units total) by mouth every 7 (seven) days.  Dispense: 4 capsule; Refill: 0  3. Polyphagia Intensive lifestyle modifications are the first line treatment for this issue. We discussed several lifestyle modifications today. Brandon Rich was given strategies to eat all of his food,  and he is to avoid excessive snack calories that are high in sugar and low in protein. Orders and follow up as documented in patient record.  Counseling Polyphagia is excessive hunger. Causes can include: low blood sugars, hypERthyroidism, PMS, lack of sleep, stress, insulin resistance, diabetes, certain medications, and diets that are deficient in protein and fiber.   4. At risk for impaired  metabolic function Brandon Rich was given approximately 20 minutes of impaired  metabolic function prevention counseling today. We discussed intensive lifestyle modifications today with an emphasis on specific nutrition and exercise instructions and strategies.   Repetitive spaced learning was employed today to elicit superior memory formation and behavioral change.  5. Obesity with current BMI of 40.1 Brandon Rich is currently in the action stage of change. As such, his goal is to continue with weight loss efforts. He has agreed to the Category 4 Plan and keeping a food journal and adhering to recommended goals of 550-700 calories and 50 grams of protein at supper daily.   Brandon Rich is to measure his snack calories, sauces, creamers, sugar, butter, etc.  Exercise goals: For substantial health benefits, adults should do at least 150 minutes (2 hours and 30 minutes) a week of moderate-intensity, or 75 minutes (1 hour and 15 minutes) a week of vigorous-intensity aerobic physical activity, or an equivalent combination of moderate- and vigorous-intensity aerobic activity. Aerobic activity should be performed in episodes of at least 10 minutes, and preferably, it should be spread throughout the week.  Behavioral modification strategies: planning for success.  Brandon Rich has agreed to follow-up with our clinic in 3 to 4 weeks. He was informed of the importance of frequent follow-up visits to maximize his success with intensive lifestyle modifications for his multiple health conditions.   Objective:   Blood pressure 117/67, pulse 64, temperature 97.8 F (36.6 C), height 5\' 8"  (1.727 m), weight 263 lb (119.3 kg), SpO2 98 %. Body mass index is 39.99 kg/m.  General: Cooperative, alert, well developed, in no acute distress. HEENT: Conjunctivae and lids unremarkable. Cardiovascular: Regular rhythm.  Lungs: Normal work of breathing. Neurologic: No focal deficits.   Lab Results  Component Value Date   CREATININE  1.00 06/17/2021   BUN 23 06/17/2021   NA 140 06/17/2021   K 4.2 06/17/2021   CL 101 06/17/2021   CO2 20 06/17/2021   Lab Results  Component Value Date   ALT 19 06/17/2021   AST 42 (H) 06/17/2021   ALKPHOS 82 06/17/2021   BILITOT 0.4 06/17/2021   Lab Results  Component Value Date   HGBA1C 6.3 (H) 06/17/2021   HGBA1C 6.5 (H) 03/12/2021   HGBA1C 7.7 (H) 12/04/2020   HGBA1C 7.4 (H) 06/27/2020   HGBA1C 9.3 (H) 12/02/2016   Lab Results  Component Value Date   INSULIN 5.2 12/04/2020   Lab Results  Component Value Date   TSH 1.550 12/04/2020   Lab Results  Component Value Date   CHOL 105 07/03/2021   HDL 35.90 (L) 07/03/2021   LDLCALC 47 07/03/2021   TRIG 110.0 07/03/2021   CHOLHDL 3 07/03/2021   Lab Results  Component Value Date   VD25OH 49.8 06/17/2021   VD25OH 62.7 03/12/2021   VD25OH 41.3 12/04/2020   Lab Results  Component Value Date   WBC 9.0 07/03/2021   HGB 14.9 07/03/2021   HCT 45.5 07/03/2021   MCV 82.5 07/03/2021   PLT 251.0 07/03/2021   No results found for: IRON, TIBC, FERRITIN  Attestation Statements:   Reviewed by clinician  on day of visit: allergies, medications, problem list, medical history, surgical history, family history, social history, and previous encounter notes.  Wilhemena Durie, am acting as transcriptionist for [employer], DO.  I have reviewed the above documentation for accuracy and completeness, and I agree with the above. Marjory Sneddon, D.O.  The [place] was signed into law in 2016 which includes the topic of electronic health records.  This provides immediate access to information in MyChart.  This includes consultation notes, operative notes, office notes, lab results and pathology reports.  If you have any questions about what you read please let us know at your next visit so we can discuss your concerns and take corrective action if need be.  We are right here with you.

## 2021-08-22 NOTE — Progress Notes (Deleted)
Impression and Recommendations:    1. Type 2 diabetes mellitus with other specified complication, with long-term current use of insulin ([place])   2. Vitamin D deficiency   3. Polyphagia   4. At risk for impaired metabolic function   5. Obesity with current BMI of 40.1      Type 2 diabetes mellitus with other specified complication, with long-term current use of insulin (HCC) - Plan: tirzepatide (MOUNJARO) 7.5 MG/0.5ML Pen  Vitamin D deficiency - Plan: Vitamin D, Ergocalciferol, (DRISDOL) 1.25 MG (50000 UNIT) CAPS capsule  Polyphagia  At risk for impaired metabolic function  Obesity with current BMI of 40.1  1. Type 2 diabetes mellitus with other specified complication, with long-term current use of insulin (HCC) *** - tirzepatide (MOUNJARO) 7.5 MG/0.5ML Pen; Inject 7.5 mg into the skin once a week.  Dispense: 2 mL; Refill: 0  2. Vitamin D deficiency *** - Vitamin D, Ergocalciferol, (DRISDOL) 1.25 MG (50000 UNIT) CAPS capsule; Take 1 capsule (50,000 Units total) by mouth every 7 (seven) days.  Dispense: 4 capsule; Refill: 0  3. Polyphagia ***  4. At risk for impaired metabolic function ***  5. Obesity with current BMI of 40.1 ***    Education and routine counseling performed. Handouts provided.  No orders of the defined types were placed in this encounter.   Meds ordered this encounter  Medications   tirzepatide (MOUNJARO) 7.5 MG/0.5ML Pen    Sig: Inject 7.5 mg into the skin once a week.    Dispense:  2 mL    Refill:  0   Vitamin D, Ergocalciferol, (DRISDOL) 1.25 MG (50000 UNIT) CAPS capsule    Sig: Take 1 capsule (50,000 Units total) by mouth every 7 (seven) days.    Dispense:  4 capsule    Refill:  0    Medications Discontinued During This Encounter  Medication Reason   Vitamin D, Ergocalciferol, (DRISDOL) 1.25 MG (50000 UNIT) CAPS capsule Reorder   tirzepatide (MOUNJARO) 7.5 MG/0.5ML Pen Reorder     The patient was counseled, risk factors were  discussed, anticipatory guidance given.  Gross side effects, risk and benefits, and alternatives of medications discussed with patient.  Patient is aware that all medications have potential side effects and we are unable to predict every side effect or drug-drug interaction that may occur.  Expresses verbal understanding and consents to current therapy plan and treatment regimen.  Return in about 4 weeks (around 09/16/2021).  Please see AVS handed out to patient at the end of our visit for further patient instructions/ counseling done pertaining to today's office visit.    Note:  This document was prepared using Dragon voice recognition software and may include unintentional dictation errors.   The [place] was signed into law in 2016 which includes the topic of electronic health records.  This provides immediate access to information in MyChart.  This includes consultation notes, operative notes, office notes, lab results and pathology reports.  If you have any questions about what you read please let us know at your next visit or call us at the office.  We are right here with you.   This document serves as a record of services personally performed by Mellody Dance, DO. It was created on her behalf by Toni Amend, a trained medical scribe. The creation of this record is based on the scribe's personal observations and the provider's statements to them.   This case required medical decision making of at least  moderate complexity. The above documentation from Toni Amend, medical scribe, has been reviewed by Mellody Dance, D.O.    Subjective:     I, Toni Amend, am serving as Education administrator for [employer].   HPI: Brandon Rich is a 61 y.o. male who presents to [place] [facility] today for follow up of [place].      HTN:  -  His blood pressure {HAS HAS NOT:18834} been controlled at home.  Pt *** has / has not been checking  it regularly.  - Patient reports good compliance with blood pressure medications  - Denies medication S-E  - He denies new onset of: chest pain, exercise intolerance, shortness of breath, dizziness, visual changes, headache, lower extremity swelling or claudication.    Last 3 blood pressure readings in our office are as follows: BP Readings from Last 3 Encounters:  08/19/21 117/67  07/30/21 115/68  07/03/21 128/62    Pulse Readings from Last 3 Encounters:  08/19/21 64  07/30/21 62  07/03/21 69    Filed Weights   08/19/21 0800  Weight: 263 lb (119.3 kg)      Patient Care Team    Relationship Specialty Notifications Start End  [name], MD PCP - General Family Medicine  01/23/16   Delrae Rend, MD Consulting Physician Endocrinology  07/03/21   Star Age, MD Attending Physician Neurology  07/03/21    Comment: sleep specialist     Lab Results  Component Value Date   CREATININE 1.00 06/17/2021   BUN 23 06/17/2021   NA 140 06/17/2021   K 4.2 06/17/2021   CL 101 06/17/2021   CO2 20 06/17/2021    Lab Results  Component Value Date   CHOL 105 07/03/2021   CHOL 103 12/04/2020   CHOL 98 (L) 06/27/2020    Lab Results  Component Value Date   HDL 35.90 (L) 07/03/2021   HDL 30 (L) 12/04/2020   HDL 31 (L) 06/27/2020    Lab Results  Component Value Date   LDLCALC 47 07/03/2021   [place] 47 12/04/2020   [place] 48 06/27/2020    Lab Results  Component Value Date   TRIG 110.0 07/03/2021   TRIG 147 12/04/2020   TRIG 100 06/27/2020    Lab Results  Component Value Date   CHOLHDL 3 07/03/2021   CHOLHDL 3.4 12/04/2020   CHOLHDL 3.2 06/27/2020    No results found for: LDLDIRECT ===================================================================   Patient Active Problem List   Diagnosis Date Noted   Class 3 severe obesity with serious comorbidity and body mass index (BMI) of 45.0 to 49.9 in adult ([place]) 31/49/7026   Nonalcoholic hepatosteatosis  12/22/2020   Other fatigue 12/04/2020   SOBOE (shortness of breath on exertion) 12/04/2020   Vitamin D deficiency 12/04/2020   Depression 12/04/2020   Chest discomfort 02/02/2019   Morbid obesity ([place]) 02/02/2019   Pain in right hand 02/15/2018   Trigger finger 02/15/2018   Cortical age-related cataract of both eyes 03/30/2017   Nuclear sclerotic cataract of both eyes 03/30/2017   Primary open angle glaucoma (POAG) of both eyes, indeterminate stage 03/30/2017   Type 2 diabetes mellitus without complication, without long-term current use of insulin ([name]) 03/30/2017   LVH (left ventricular hypertrophy) 03/29/2016   FUO (fever of unknown origin) 02/16/2016   Acute combined systolic and diastolic heart failure ([place]) 02/16/2016   Weight gain 02/16/2016   Weight loss 02/16/2016   Rash and nonspecific skin eruption 02/16/2016   DOE (  dyspnea on exertion) 02/16/2016   T wave inversion in EKG 02/16/2016   Transaminitis 02/16/2016   Arthritis of big toe 08/23/2013   Pain of left great toe 08/23/2013   Hypertension associated with type 2 diabetes mellitus ([place]) 12/20/2011   Diabetes mellitus ([place]) 12/20/2011   Hyperlipidemia associated with type 2 diabetes mellitus ([place]) 12/20/2011   Glaucoma (increased eye pressure) 07/19/1996     Past Medical History:  Diagnosis Date   Acute combined systolic and diastolic heart failure ([place]) 02/16/2016   Allergy    Anxiety    Phreesia 12/27/2019   Anxiety    DDD (degenerative disc disease), cervical    Depression    Diabetes mellitus    Diabetes mellitus without complication ([place])    Phreesia 12/27/2019   Diabetic foot ulcer (HCC)    FUO (fever of unknown origin) 02/16/2016   Glaucoma    Heartburn    Hyperlipidemia    Hypertension    LVH (left ventricular hypertrophy) 03/29/2016   Palpitation    Rash and nonspecific skin eruption 02/16/2016   Sleep apnea    CPAP   SOBOE (shortness of breath on exertion)    Transaminitis 02/16/2016      Past Surgical History:  Procedure Laterality Date   AMPUTATION TOE  04/2020   AMPUTATION TOE  01/2020   HERNIA REPAIR     SPINE SURGERY     L4/L5   TUMOR REMOVAL  12/1998   Bone Cyst     Family History  Problem Relation Age of Onset   Skin cancer Mother    Cancer Mother    Heart disease Mother        Atrial Fib   Hypertension Mother    Hyperlipidemia Mother    Obesity Mother    Cancer Father    Heart disease Father        Valve Disease   Heart disease Maternal Grandmother    Hyperlipidemia Maternal Grandmother    Hypertension Maternal Grandmother    Heart disease Maternal Grandfather    Heart disease Paternal Grandfather    Hypertension Paternal Grandfather    Hyperlipidemia Paternal Grandfather    Sleep apnea Neg Hx      Social History   Substance and Sexual Activity  Drug Use No  ,  Social History   Substance and Sexual Activity  Alcohol Use No   Alcohol/week: 0.0 standard drinks   Comment: per pt 1 drink once in a long while  ,  Social History   Tobacco Use  Smoking Status Never  Smokeless Tobacco Never  ,    Current Outpatient Medications on File Prior to Visit  Medication Sig Dispense Refill   Ascorbic Acid (VITAMIN C) 1000 MG tablet Take 1,000 mg by mouth daily.     aspirin 81 MG tablet Take 81 mg by mouth daily.     atorvastatin (LIPITOR) 20 MG tablet Take 1 tablet (20 mg total) by mouth daily. 90 tablet 1   BD PEN NEEDLE NANO U/F 32G X 4 MM MISC      calcium carbonate (TUMS - DOSED IN MG ELEMENTAL CALCIUM) 500 MG chewable tablet Chew 2 tablets by mouth daily as needed for indigestion or heartburn.     Cholecalciferol (VITAMIN D) 125 MCG (5000 UT) CAPS Take 1 capsule by mouth daily. 30 capsule 0   dorzolamide-timolol (COSOPT) 22.3-6.8 MG/ML ophthalmic solution 1 drop 2 (two) times daily.     empagliflozin (JARDIANCE) 25 MG TABS tablet Take 25 mg by  mouth daily. 30 tablet 3   fexofenadine (ALLEGRA) 180 MG tablet Take 180 mg by mouth  daily.     fluticasone (FLONASE) 50 MCG/ACT nasal spray PLACE 2 SPRAYS INTO BOTH NOSTRILS DAILY. 16 g 10   hydrOXYzine (ATARAX) 25 MG tablet Take 0.5-1 tablets (12.5-25 mg total) by mouth at bedtime as needed for anxiety. 90 tablet 1   Insulin Detemir (LEVEMIR FLEXPEN [place]) Inject 40 Units into the skin daily.     lisinopril (ZESTRIL) 40 MG tablet Take 1 tablet (40 mg total) by mouth daily. 90 tablet 3   metFORMIN (GLUCOPHAGE) 1000 MG tablet TAKE ONE TABLET BY MOUTH TWICE A DAY WITH A MEAL 180 tablet 1   Multiple Vitamin (MULTIVITAMIN) tablet Take 1 tablet by mouth every evening.      sertraline (ZOLOFT) 100 MG tablet Take 1 tablet (100 mg total) by mouth daily. 90 tablet 1   Travoprost, BAK Free, (TRAVATAN) 0.004 % SOLN ophthalmic solution SMARTSIG:In Eye(s)     No current facility-administered medications on file prior to visit.     Allergies  Allergen Reactions   Other Other (See Comments)    Hayfever     Review of Systems:   General:  Denies fever, chills Optho/Auditory:   Denies visual changes, blurred vision Respiratory:   Denies SOB, cough, wheeze, DIB  Cardiovascular:   Denies chest pain, palpitations, painful respirations Gastrointestinal:   Denies nausea, vomiting, diarrhea.  Endocrine:     Denies new hot or cold intolerance Musculoskeletal:  Denies joint swelling, gait issues, or new unexplained myalgias/ arthralgias Skin:  Denies rash, suspicious lesions  Neurological:    Denies dizziness, unexplained weakness, numbness  Psychiatric/Behavioral:   Denies mood changes  Objective:    Blood pressure 117/67, pulse 64, temperature 97.8 F (36.6 C), height 5\' 8"  (1.727 m), weight 263 lb (119.3 kg), SpO2 98 %.  Body mass index is 39.99 kg/m.  General: Well Developed, well nourished, and in no acute distress.  HEENT: Normocephalic, atraumatic, pupils equal round reactive to light, neck supple, No carotid bruits, no JVD Skin: Warm and dry, cap RF less 2 sec Cardiac:  Regular rate and rhythm, S1, S2 WNL's, no murmurs rubs or gallops Respiratory: ECTA B/L, Not using accessory muscles, speaking in full sentences. NeuroM-Sk: Ambulates w/o assistance, moves ext * 4 w/o difficulty, sensation grossly intact.  Ext: scant edema b/l lower ext Psych: No HI/SI, judgement and insight good, Euthymic mood. Full Affect.

## 2021-08-26 DIAGNOSIS — R632 Polyphagia: Secondary | ICD-10-CM | POA: Insufficient documentation

## 2021-09-15 ENCOUNTER — Ambulatory Visit (INDEPENDENT_AMBULATORY_CARE_PROVIDER_SITE_OTHER): Payer: Managed Care, Other (non HMO) | Admitting: Family Medicine

## 2021-09-23 ENCOUNTER — Other Ambulatory Visit (INDEPENDENT_AMBULATORY_CARE_PROVIDER_SITE_OTHER): Payer: Self-pay | Admitting: Family Medicine

## 2021-09-23 DIAGNOSIS — E1169 Type 2 diabetes mellitus with other specified complication: Secondary | ICD-10-CM

## 2021-09-23 DIAGNOSIS — E559 Vitamin D deficiency, unspecified: Secondary | ICD-10-CM

## 2021-09-23 DIAGNOSIS — Z794 Long term (current) use of insulin: Secondary | ICD-10-CM

## 2021-09-23 MED ORDER — TIRZEPATIDE 7.5 MG/0.5ML ~~LOC~~ SOAJ: 7.5000 mg | SUBCUTANEOUS | 0 refills | Status: DC

## 2021-09-23 MED ORDER — VITAMIN D (ERGOCALCIFEROL) 1.25 MG (50000 UNIT) PO CAPS: 50000.0000 [IU] | ORAL_CAPSULE | ORAL | 0 refills | Status: DC

## 2021-09-23 NOTE — Telephone Encounter (Signed)
Dr.Opalski ?

## 2021-10-01 ENCOUNTER — Other Ambulatory Visit: Payer: Self-pay

## 2021-10-01 ENCOUNTER — Encounter (INDEPENDENT_AMBULATORY_CARE_PROVIDER_SITE_OTHER): Payer: Self-pay | Admitting: Family Medicine

## 2021-10-01 ENCOUNTER — Ambulatory Visit (INDEPENDENT_AMBULATORY_CARE_PROVIDER_SITE_OTHER): Payer: Managed Care, Other (non HMO) | Admitting: Family Medicine

## 2021-10-01 VITALS — BP 128/77 | HR 62 | Temp 97.4°F | Ht 68.0 in | Wt 267.0 lb

## 2021-10-01 DIAGNOSIS — Z7985 Long-term (current) use of injectable non-insulin antidiabetic drugs: Secondary | ICD-10-CM

## 2021-10-01 DIAGNOSIS — Z6841 Body Mass Index (BMI) 40.0 and over, adult: Secondary | ICD-10-CM | POA: Diagnosis not present

## 2021-10-01 DIAGNOSIS — E1169 Type 2 diabetes mellitus with other specified complication: Secondary | ICD-10-CM

## 2021-10-01 DIAGNOSIS — E669 Obesity, unspecified: Secondary | ICD-10-CM | POA: Diagnosis not present

## 2021-10-01 DIAGNOSIS — E559 Vitamin D deficiency, unspecified: Secondary | ICD-10-CM | POA: Diagnosis not present

## 2021-10-01 MED ORDER — VITAMIN D (ERGOCALCIFEROL) 1.25 MG (50000 UNIT) PO CAPS: 50000.0000 [IU] | ORAL_CAPSULE | ORAL | 0 refills | Status: DC

## 2021-10-07 NOTE — Progress Notes (Signed)
? ? ? ?Chief Complaint:  ? ?OBESITY ?Brandon Rich is here to discuss his progress with his obesity treatment plan along with follow-up of his obesity related diagnoses. [name] is on the Category 4 Plan and keeping a food journal and adhering to recommended goals of 550-700 calories and 50 grams of protein at supper and states he is following his eating plan approximately 80% of the time. [name] states he is using the exercise bike for 30 minutes 2-3 times per week. ? ?Today's visit was #: 16 ?Starting weight: 302 lbs ?Starting date: 12/04/2020 ?Today's weight: 267 lbs ?Today's date: 10/01/2021 ?Total lbs lost to date: 35 lbs ?Total lbs lost since last in-office visit: 0 ? ?Interim History: Brandon Rich has been tempted more lately, snacking more than usual. ? ?Subjective:  ? ?1. Type 2 diabetes mellitus with other specified complication, with long-term current use of insulin ([place]) ?FBS elevated lately to 140-150s.   ? ?2. Vitamin D deficiency ?He is currently taking prescription vitamin D 50,000 IU each week. He denies nausea, vomiting or muscle weakness. ? ?3. At risk for hypoglycemia ?Brandon Rich is at increased risk for hypoglycemia due to increased snacking lately.   ? ?Assessment/Plan:  ?No orders of the defined types were placed in this encounter. ? ? ?Medications Discontinued During This Encounter  ?Medication Reason  ? Vitamin D, Ergocalciferol, (DRISDOL) 1.25 MG (50000 UNIT) CAPS capsule Reorder  ?  ? ?Meds ordered this encounter  ?Medications  ? Vitamin D, Ergocalciferol, (DRISDOL) 1.25 MG (50000 UNIT) CAPS capsule  ?  Sig: Take 1 capsule (50,000 Units total) by mouth every 7 (seven) days.  ?  Dispense:  4 capsule  ?  Refill:  0  ?  30 d supply;  ** OV for RF **   Do not send RF request  ?  ? ?1. Type 2 diabetes mellitus with other specified complication, with long-term current use of insulin ([place]) ?Continue Mounjaro.  No need for refill.  Good blood sugar control is important to decrease the likelihood of diabetic  complications such as nephropathy, neuropathy, limb loss, blindness, coronary artery disease, and death. Intensive lifestyle modification including diet, exercise and weight loss are the first line of treatment for diabetes.  ? ?2. Vitamin D deficiency ?Low Vitamin D level contributes to fatigue and are associated with obesity, breast, and colon cancer. He agrees to continue to take prescription Vitamin D '@50'$ ,000 IU every week and will follow-up for routine testing of Vitamin D, at least 2-3 times per year to avoid over-replacement. ? ?- Refill Vitamin D, Ergocalciferol, (DRISDOL) 1.25 MG (50000 UNIT) CAPS capsule; Take 1 capsule (50,000 Units total) by mouth every 7 (seven) days.  Dispense: 4 capsule; Refill: 0 ? ?3. At risk for hypoglycemia ?Brandon Rich was given approximately 9 minutes of counseling today regarding prevention of hypoglycemia. He was advised of symptoms of hypoglycemia. Brandon Rich was instructed to avoid skipping meals, eat regular protein rich meals, and schedule low calorie snacks as needed. ? ?Repetitive spaced learning was employed today to elicit superior memory formation and behavioral change.  ? ?4. Obesity, current BMI 40.7 ? ?Brandon Rich is currently in the action stage of change. As such, his goal is to continue with weight loss efforts. He has agreed to the Category 4 Plan and keeping a food journal and adhering to recommended goals of 550-700 calories and 50 grams of protein.  ? ?He will journal his snack calories daily.  Increase exercise to 30 minutes per day 5 days per week. ? ?Exercise  goals:  As is. ? ?Behavioral modification strategies: increasing lean protein intake, decreasing simple carbohydrates, and planning for success. ? ?Brandon Rich has agreed to follow-up with our clinic in 3 weeks. He was informed of the importance of frequent follow-up visits to maximize his success with intensive lifestyle modifications for his multiple health conditions.  ? ?Objective:  ? ?Blood pressure 128/77,  pulse 62, temperature (!) 97.4 ?F (36.3 ?C), height '5\' 8"'$  (1.727 m), weight 267 lb (121.1 kg), SpO2 97 %. ?Body mass index is 40.6 kg/m?. ? ?General: Cooperative, alert, well developed, in no acute distress. ?HEENT: Conjunctivae and lids unremarkable. ?Cardiovascular: Regular rhythm.  ?Lungs: Normal work of breathing. ?Neurologic: No focal deficits.  ? ?Lab Results  ?Component Value Date  ? CREATININE 1.00 06/17/2021  ? BUN 23 06/17/2021  ? NA 140 06/17/2021  ? K 4.2 06/17/2021  ? CL 101 06/17/2021  ? CO2 20 06/17/2021  ? ?Lab Results  ?Component Value Date  ? ALT 19 06/17/2021  ? AST 42 (H) 06/17/2021  ? ALKPHOS 82 06/17/2021  ? BILITOT 0.4 06/17/2021  ? ?Lab Results  ?Component Value Date  ? HGBA1C 6.3 (H) 06/17/2021  ? HGBA1C 6.5 (H) 03/12/2021  ? HGBA1C 7.7 (H) 12/04/2020  ? HGBA1C 7.4 (H) 06/27/2020  ? HGBA1C 9.3 (H) 12/02/2016  ? ?Lab Results  ?Component Value Date  ? INSULIN 5.2 12/04/2020  ? ?Lab Results  ?Component Value Date  ? TSH 1.550 12/04/2020  ? ?Lab Results  ?Component Value Date  ? CHOL 105 07/03/2021  ? HDL 35.90 (L) 07/03/2021  ? [place] 47 07/03/2021  ? TRIG 110.0 07/03/2021  ? CHOLHDL 3 07/03/2021  ? ?Lab Results  ?Component Value Date  ? VD25OH 49.8 06/17/2021  ? VD25OH 62.7 03/12/2021  ? VD25OH 41.3 12/04/2020  ? ?Lab Results  ?Component Value Date  ? WBC 9.0 07/03/2021  ? HGB 14.9 07/03/2021  ? HCT 45.5 07/03/2021  ? MCV 82.5 07/03/2021  ? PLT 251.0 07/03/2021  ? ?Attestation Statements:  ? ?Reviewed by clinician on day of visit: allergies, medications, problem list, medical history, surgical history, family history, social history, and previous encounter notes. ? ?I, Water quality scientist, CMA, am acting as transcriptionist for [employer], DO. ? ?I have reviewed the above documentation for accuracy and completeness, and I agree with the above. Marjory Sneddon, D.O. ? ?The [place] was signed into law in 2016 which includes the topic of electronic health records.  This provides  immediate access to information in MyChart.  This includes consultation notes, operative notes, office notes, lab results and pathology reports.  If you have any questions about what you read please let us know at your next visit so we can discuss your concerns and take corrective action if need be.  We are right here with you. ? ?

## 2021-10-20 ENCOUNTER — Encounter (INDEPENDENT_AMBULATORY_CARE_PROVIDER_SITE_OTHER): Payer: Self-pay | Admitting: Physician Assistant

## 2021-10-20 ENCOUNTER — Ambulatory Visit (INDEPENDENT_AMBULATORY_CARE_PROVIDER_SITE_OTHER): Payer: Managed Care, Other (non HMO) | Admitting: Physician Assistant

## 2021-10-20 VITALS — BP 125/71 | HR 65 | Temp 97.6°F | Ht 68.0 in | Wt 272.0 lb

## 2021-10-20 DIAGNOSIS — Z9189 Other specified personal risk factors, not elsewhere classified: Secondary | ICD-10-CM

## 2021-10-20 DIAGNOSIS — E66813 Obesity, class 3: Secondary | ICD-10-CM

## 2021-10-20 DIAGNOSIS — E669 Obesity, unspecified: Secondary | ICD-10-CM | POA: Diagnosis not present

## 2021-10-20 DIAGNOSIS — Z6841 Body Mass Index (BMI) 40.0 and over, adult: Secondary | ICD-10-CM | POA: Diagnosis not present

## 2021-10-20 DIAGNOSIS — E559 Vitamin D deficiency, unspecified: Secondary | ICD-10-CM | POA: Diagnosis not present

## 2021-10-20 DIAGNOSIS — E1169 Type 2 diabetes mellitus with other specified complication: Secondary | ICD-10-CM

## 2021-10-20 DIAGNOSIS — Z794 Long term (current) use of insulin: Secondary | ICD-10-CM

## 2021-10-20 MED ORDER — VITAMIN D (ERGOCALCIFEROL) 1.25 MG (50000 UNIT) PO CAPS: 50000.0000 [IU] | ORAL_CAPSULE | ORAL | 0 refills | Status: DC

## 2021-10-20 NOTE — Progress Notes (Signed)
? ? ? ?Chief Complaint:  ? ?OBESITY ?Brandon Rich is here to discuss his progress with his obesity treatment plan along with follow-up of his obesity related diagnoses. Damir is on the Category 4 Plan and keeping a food journal and adhering to recommended goals of 505-700 calories and 50 grams of protein and states he is following his eating plan approximately 80% of the time. Cobin states he is cardio for 30 minutes 1-3 times per week. ? ?Today's visit was #: 15 ?Starting weight: 302 lbs ?Starting date: 12/04/2020 ?Today's weight: 272 lbs ?Today's date:10/20/2021 ?Total lbs lost to date: 30 lbs ?Total lbs lost since last in-office visit: 0 ? ?Interim History: Ervine does well sticking to the meals with the exception of the weekends. He reports that he has been snacking more than normal. He eats out on the weekends at times. He states his Mounjaro 7.5 mg is not decreasing his appetite as far as he can tell.  ? ?Subjective:  ? ?1. Type 2 diabetes mellitus with other specified complication, with long-term current use of insulin ([place]) ?Shawan is currently on Mounjaro, Metformin and Jardiance. His last A1C was 6.3. He has a 10 mg Mounjaro prescription from endocrinologist but pharmacy does not have that dose in currently.  ? ?2. Vitamin D deficiency ?Hershall is on Vitamin D weekly. Tolerating well.  ? ?3. At risk for side effect of medication ?Larone is at risk for side effect of medication due to increasing dose of Mounjaro.  ? ?Assessment/Plan:  ? ?1. Type 2 diabetes mellitus with other specified complication, with long-term current use of insulin ([place]) ?Cassandra will check with pharmacy about availability of Mounjaro dose. We will consider increasing to 12.5 mg if 10 mg is not available. Good blood sugar control is important to decrease the likelihood of diabetic complications such as nephropathy, neuropathy, limb loss, blindness, coronary artery disease, and death. Intensive lifestyle modification including diet,  exercise and weight loss are the first line of treatment for diabetes.  ? ?2. Vitamin D deficiency ?Low Vitamin D level contributes to fatigue and are associated with obesity, breast, and colon cancer. We will refill prescription Vitamin D 50,000 IU every week for 1 month with no refills and Jakson will follow-up for routine testing of Vitamin D, at least 2-3 times per year to avoid over-replacement. ? ?- Vitamin D, Ergocalciferol, (DRISDOL) 1.25 MG (50000 UNIT) CAPS capsule; Take 1 capsule (50,000 Units total) by mouth every 7 (seven) days.  Dispense: 4 capsule; Refill: 0 ? ?3. At risk for side effect of medication ?[place]ion was given approximately 15 minutes of drug side effect counseling today.  We discussed side effect possibility and risk versus benefits. Ziyan agreed to the medication and will contact this office if these side effects are intolerable. ? ?Repetitive spaced learning was employed today to elicit superior memory formation and behavioral change.  ? ?4. Obesity, current BMI 40.7 ?Khyrie is currently in the action stage of change. As such, his goal is to continue with weight loss efforts. He has agreed to the Category 4 Plan.  ? ?Exercise goals:  As is.  ? ?Behavioral modification strategies: decreasing eating out and meal planning and cooking strategies. ? ?Shilo has agreed to follow-up with our clinic in 3 weeks. He was informed of the importance of frequent follow-up visits to maximize his success with intensive lifestyle modifications for his multiple health conditions.  ? ?Objective:  ? ?Blood pressure 125/71, pulse 65, temperature 97.6 ?F (36.4 ?C), temperature source Oral, height  $'5\' 8"'G$  (1.727 m), weight 272 lb (123.4 kg), SpO2 98 %. ?Body mass index is 41.36 kg/m?. ? ?General: Cooperative, alert, well developed, in no acute distress. ?HEENT: Conjunctivae and lids unremarkable. ?Cardiovascular: Regular rhythm.  ?Lungs: Normal work of breathing. ?Neurologic: No focal deficits.  ? ?Lab  Results  ?Component Value Date  ? CREATININE 1.00 06/17/2021  ? BUN 23 06/17/2021  ? NA 140 06/17/2021  ? K 4.2 06/17/2021  ? CL 101 06/17/2021  ? CO2 20 06/17/2021  ? ?Lab Results  ?Component Value Date  ? ALT 19 06/17/2021  ? AST 42 (H) 06/17/2021  ? ALKPHOS 82 06/17/2021  ? BILITOT 0.4 06/17/2021  ? ?Lab Results  ?Component Value Date  ? HGBA1C 6.3 (H) 06/17/2021  ? HGBA1C 6.5 (H) 03/12/2021  ? HGBA1C 7.7 (H) 12/04/2020  ? HGBA1C 7.4 (H) 06/27/2020  ? HGBA1C 9.3 (H) 12/02/2016  ? ?Lab Results  ?Component Value Date  ? INSULIN 5.2 12/04/2020  ? ?Lab Results  ?Component Value Date  ? TSH 1.550 12/04/2020  ? ?Lab Results  ?Component Value Date  ? CHOL 105 07/03/2021  ? HDL 35.90 (L) 07/03/2021  ? [place] 47 07/03/2021  ? TRIG 110.0 07/03/2021  ? CHOLHDL 3 07/03/2021  ? ?Lab Results  ?Component Value Date  ? VD25OH 49.8 06/17/2021  ? VD25OH 62.7 03/12/2021  ? VD25OH 41.3 12/04/2020  ? ?Lab Results  ?Component Value Date  ? WBC 9.0 07/03/2021  ? HGB 14.9 07/03/2021  ? HCT 45.5 07/03/2021  ? MCV 82.5 07/03/2021  ? PLT 251.0 07/03/2021  ? ?No results found for: IRON, TIBC, FERRITIN ? ?Attestation Statements:  ? ?Reviewed by clinician on day of visit: allergies, medications, problem list, medical history, surgical history, family history, social history, and previous encounter notes. ? ?I, Tonye Pearson, am acting as Location manager for [employer], PA-C. ? ?I have reviewed the above documentation for accuracy and completeness, and I agree with the above. Abby Potash, PA-C ? ?

## 2021-11-10 LAB — HM DIABETES EYE EXAM

## 2021-11-12 ENCOUNTER — Ambulatory Visit (INDEPENDENT_AMBULATORY_CARE_PROVIDER_SITE_OTHER): Payer: Managed Care, Other (non HMO) | Admitting: Family Medicine

## 2021-11-12 ENCOUNTER — Encounter (INDEPENDENT_AMBULATORY_CARE_PROVIDER_SITE_OTHER): Payer: Self-pay | Admitting: Family Medicine

## 2021-11-12 VITALS — BP 122/70 | HR 65 | Temp 98.2°F | Ht 68.0 in | Wt 268.0 lb

## 2021-11-12 DIAGNOSIS — E559 Vitamin D deficiency, unspecified: Secondary | ICD-10-CM | POA: Diagnosis not present

## 2021-11-12 DIAGNOSIS — E669 Obesity, unspecified: Secondary | ICD-10-CM

## 2021-11-12 DIAGNOSIS — Z6841 Body Mass Index (BMI) 40.0 and over, adult: Secondary | ICD-10-CM | POA: Diagnosis not present

## 2021-11-12 DIAGNOSIS — E1169 Type 2 diabetes mellitus with other specified complication: Secondary | ICD-10-CM

## 2021-11-12 DIAGNOSIS — Z7985 Long-term (current) use of injectable non-insulin antidiabetic drugs: Secondary | ICD-10-CM

## 2021-11-12 DIAGNOSIS — Z9189 Other specified personal risk factors, not elsewhere classified: Secondary | ICD-10-CM

## 2021-11-12 MED ORDER — VITAMIN D 125 MCG (5000 UT) PO CAPS: 1.0000 | ORAL_CAPSULE | Freq: Every day | ORAL | 0 refills | Status: DC

## 2021-11-12 MED ORDER — VITAMIN D (ERGOCALCIFEROL) 1.25 MG (50000 UNIT) PO CAPS: 50000.0000 [IU] | ORAL_CAPSULE | ORAL | 0 refills | Status: DC

## 2021-11-13 LAB — COMPREHENSIVE METABOLIC PANEL
ALT: 25 IU/L (ref 0–44)
AST: 50 IU/L — ABNORMAL HIGH (ref 0–40)
Albumin/Globulin Ratio: 2 (ref 1.2–2.2)
Albumin: 4.8 g/dL (ref 3.8–4.8)
Alkaline Phosphatase: 60 IU/L (ref 44–121)
BUN/Creatinine Ratio: 26 — ABNORMAL HIGH (ref 10–24)
BUN: 23 mg/dL (ref 8–27)
Bilirubin Total: 0.4 mg/dL (ref 0.0–1.2)
CO2: 22 mmol/L (ref 20–29)
Calcium: 9.5 mg/dL (ref 8.6–10.2)
Chloride: 100 mmol/L (ref 96–106)
Creatinine, Ser: 0.9 mg/dL (ref 0.76–1.27)
Globulin, Total: 2.4 g/dL (ref 1.5–4.5)
Glucose: 75 mg/dL (ref 70–99)
Potassium: 4.2 mmol/L (ref 3.5–5.2)
Sodium: 137 mmol/L (ref 134–144)
Total Protein: 7.2 g/dL (ref 6.0–8.5)
eGFR: 97 mL/min/{1.73_m2} (ref >59)

## 2021-11-13 LAB — HEMOGLOBIN A1C
Est. average glucose Bld gHb Est-mCnc: 157 mg/dL
Hgb A1c MFr Bld: 7.1 % — ABNORMAL HIGH (ref 4.8–5.6)

## 2021-11-13 LAB — VITAMIN D 25 HYDROXY (VIT D DEFICIENCY, FRACTURES): Vit D, 25-Hydroxy: 54.7 ng/mL (ref 30.0–100.0)

## 2021-11-18 ENCOUNTER — Encounter: Payer: Self-pay | Admitting: Family Medicine

## 2021-12-01 NOTE — Progress Notes (Signed)
? ? ? ?Chief Complaint:  ? ?OBESITY ?Brandon Rich is here to discuss his progress with his obesity treatment plan along with follow-up of his obesity related diagnoses. Brandon Rich is on the Category 4 Plan and states he is following his eating plan approximately 90% of the time. Brandon Rich states he is doing cardio for 30 minutes 3 times per week. ? ?Today's visit was #: 18 ?Starting weight: 302 lbs ?Starting date: 12/04/2020 ?Today's weight: 268 lbs ?Today's date: 11/12/2021 ?Total lbs lost to date: 46 ?Total lbs lost since last in-office visit: 4 ? ?Interim History: Brandon Rich is here for a follow up office visit.  We reviewed his meal plan and all questions were answered.  Patient's food recall appears to be accurate and consistent with what is on plan when he is following it.   When eating on plan, his hunger and cravings are well controlled.  Doing a better job getting his protein in and not going overboard on snack calories. ? ?Subjective:  ? ?1. Type 2 diabetes mellitus with other specified complication, with long-term current use of insulin ([place]) ?Taking 40 units of Levemir, Jardiance, metformin, and Mounjaro. A1c at 6.3. Dr. Veatrice Bourbon blood sugars in 120's. Increased Mounjaro from 7.5 mg to 10 mg with no side effects. This was increased at last office visit. ? ?2. Vitamin D deficiency ?Last Vitamin D level was 49.8 four month ago.  ? ?3. At risk for activity intolerance ?Brandon Rich is at risk of exercise intolerance due to current physical state. ? ?Assessment/Plan:  ? ?Orders Placed This Encounter  ?Procedures  ? VITAMIN D 25 Hydroxy (Vit-D Deficiency, Fractures)  ? Hemoglobin A1c  ? Comprehensive metabolic panel  ? ? ?Medications Discontinued During This Encounter  ?Medication Reason  ? tirzepatide (MOUNJARO) 7.5 MG/0.5ML Pen Dose change  ? Cholecalciferol (VITAMIN D) 125 MCG (5000 UT) CAPS Reorder  ? Cholecalciferol (VITAMIN D) 125 MCG (5000 UT) CAPS Entry Error  ? Vitamin D, Ergocalciferol, (DRISDOL) 1.25 MG  (50000 UNIT) CAPS capsule Reorder  ?  ? ?Meds ordered this encounter  ?Medications  ? DISCONTD: Cholecalciferol (VITAMIN D) 125 MCG (5000 UT) CAPS  ?  Sig: Take 1 capsule by mouth daily.  ?  Dispense:  30 capsule  ?  Refill:  0  ? Vitamin D, Ergocalciferol, (DRISDOL) 1.25 MG (50000 UNIT) CAPS capsule  ?  Sig: Take 1 capsule (50,000 Units total) by mouth every 7 (seven) days.  ?  Dispense:  4 capsule  ?  Refill:  0  ?  30 d supply;  ** OV for RF **   Do not send RF request  ?  ? ?1. Type 2 diabetes mellitus with other specified complication, with long-term current use of insulin ([place]) ?Continue Mounjaro at 10 mg, metformin and other medications per Endo. We will check labs today. ? ?- Hemoglobin A1c ?- Comprehensive metabolic panel ? ?2. Vitamin D deficiency ?We will check labs today. We will refill prescription Vitamin D for 1 month. Quadir will follow-up for routine testing of Vitamin D, at least 2-3 times per year to avoid over-replacement. ? ?- VITAMIN D 25 Hydroxy (Vit-D Deficiency, Fractures) ?- Vitamin D, Ergocalciferol, (DRISDOL) 1.25 MG (50000 UNIT) CAPS capsule; Take 1 capsule (50,000 Units total) by mouth every 7 (seven) days.  Dispense: 4 capsule; Refill: 0 ? ?3. At risk for activity intolerance ?Brandon Rich was given approximately 9 minutes of exercise intolerance counseling today. He is 61 y.o. male and has risk factors exercise intolerance including obesity. We  discussed intensive lifestyle modifications today with an emphasis on specific weight loss instructions and strategies. Brandon Rich will slowly increase activity as tolerated. ? ?Repetitive spaced learning was employed today to elicit superior memory formation and behavioral change. ? ?4. Obesity, current BMI 40.8 ?Philemon is currently in the action stage of change. As such, his goal is to continue with weight loss efforts. He has agreed to the Category 4 Plan.  ? ?Exercise goals: As is. Increase activity as tolerated. ? ?Behavioral modification  strategies: increasing lean protein intake, decreasing simple carbohydrates, and planning for success. ? ?Brandon Rich has agreed to follow-up with our clinic in 3 weeks. He was informed of the importance of frequent follow-up visits to maximize his success with intensive lifestyle modifications for his multiple health conditions.  ? ?Brandon Rich was informed we would discuss his lab results at his next visit unless there is a critical issue that needs to be addressed sooner. Brandon Rich agreed to keep his next visit at the agreed upon time to discuss these results. ? ?Objective:  ? ?Blood pressure 122/70, pulse 65, temperature 98.2 ?F (36.8 ?C), height '5\' 8"'$  (1.727 m), weight 268 lb (121.6 kg), SpO2 96 %. ?Body mass index is 40.75 kg/m?. ? ?General: Cooperative, alert, well developed, in no acute distress. ?HEENT: Conjunctivae and lids unremarkable. ?Cardiovascular: Regular rhythm.  ?Lungs: Normal work of breathing. ?Neurologic: No focal deficits.  ? ?Lab Results  ?Component Value Date  ? CREATININE 0.90 11/12/2021  ? BUN 23 11/12/2021  ? NA 137 11/12/2021  ? K 4.2 11/12/2021  ? CL 100 11/12/2021  ? CO2 22 11/12/2021  ? ?Lab Results  ?Component Value Date  ? ALT 25 11/12/2021  ? AST 50 (H) 11/12/2021  ? ALKPHOS 60 11/12/2021  ? BILITOT 0.4 11/12/2021  ? ?Lab Results  ?Component Value Date  ? HGBA1C 7.1 (H) 11/12/2021  ? HGBA1C 6.3 (H) 06/17/2021  ? HGBA1C 6.5 (H) 03/12/2021  ? HGBA1C 7.7 (H) 12/04/2020  ? HGBA1C 7.4 (H) 06/27/2020  ? ?Lab Results  ?Component Value Date  ? INSULIN 5.2 12/04/2020  ? ?Lab Results  ?Component Value Date  ? TSH 1.550 12/04/2020  ? ?Lab Results  ?Component Value Date  ? CHOL 105 07/03/2021  ? HDL 35.90 (L) 07/03/2021  ? [place] 47 07/03/2021  ? TRIG 110.0 07/03/2021  ? CHOLHDL 3 07/03/2021  ? ?Lab Results  ?Component Value Date  ? VD25OH 54.7 11/12/2021  ? VD25OH 49.8 06/17/2021  ? VD25OH 62.7 03/12/2021  ? ?Lab Results  ?Component Value Date  ? WBC 9.0 07/03/2021  ? HGB 14.9 07/03/2021  ? HCT 45.5  07/03/2021  ? MCV 82.5 07/03/2021  ? PLT 251.0 07/03/2021  ? ?No results found for: IRON, TIBC, FERRITIN ? ?Attestation Statements:  ? ?Reviewed by clinician on day of visit: allergies, medications, problem list, medical history, surgical history, family history, social history, and previous encounter notes. ? ? ?I, Trixie Dredge, am acting as transcriptionist for [employer], DO. ? ?I have reviewed the above documentation for accuracy and completeness, and I agree with the above. Marjory Sneddon, D.O. ? ?The [place] was signed into law in 2016 which includes the topic of electronic health records.  This provides immediate access to information in MyChart.  This includes consultation notes, operative notes, office notes, lab results and pathology reports.  If you have any questions about what you read please let us know at your next visit so we can discuss your concerns and take  corrective action if need be.  We are right here with you. ? ? ?

## 2021-12-03 ENCOUNTER — Encounter (INDEPENDENT_AMBULATORY_CARE_PROVIDER_SITE_OTHER): Payer: Self-pay | Admitting: Family Medicine

## 2021-12-03 ENCOUNTER — Ambulatory Visit (INDEPENDENT_AMBULATORY_CARE_PROVIDER_SITE_OTHER): Payer: Managed Care, Other (non HMO) | Admitting: Family Medicine

## 2021-12-03 VITALS — BP 122/71 | HR 72 | Temp 98.0°F | Ht 68.0 in | Wt 266.0 lb

## 2021-12-03 DIAGNOSIS — K760 Fatty (change of) liver, not elsewhere classified: Secondary | ICD-10-CM | POA: Diagnosis not present

## 2021-12-03 DIAGNOSIS — Z6841 Body Mass Index (BMI) 40.0 and over, adult: Secondary | ICD-10-CM

## 2021-12-03 DIAGNOSIS — E559 Vitamin D deficiency, unspecified: Secondary | ICD-10-CM | POA: Diagnosis not present

## 2021-12-03 DIAGNOSIS — Z9189 Other specified personal risk factors, not elsewhere classified: Secondary | ICD-10-CM

## 2021-12-03 DIAGNOSIS — E119 Type 2 diabetes mellitus without complications: Secondary | ICD-10-CM

## 2021-12-03 DIAGNOSIS — Z794 Long term (current) use of insulin: Secondary | ICD-10-CM

## 2021-12-03 DIAGNOSIS — E669 Obesity, unspecified: Secondary | ICD-10-CM

## 2021-12-03 MED ORDER — VITAMIN D (ERGOCALCIFEROL) 1.25 MG (50000 UNIT) PO CAPS
50000.0000 [IU] | ORAL_CAPSULE | ORAL | 0 refills | Status: DC
Start: 2021-12-03 — End: 2021-12-31

## 2021-12-12 NOTE — Progress Notes (Unsigned)
Chief Complaint:   OBESITY Brandon Rich is here to discuss his progress with his obesity treatment plan along with follow-up of his obesity related diagnoses. Brandon Rich is on the Category 4 Plan and states he is following his eating plan approximately 80% of the time. Brandon Rich states he is riding an exercise bike for 30 minutes 3 times per week.  Today's visit was #: 68 Starting weight: 302 lbs Starting date: 12/04/2020 Today's weight: 266 lbs Today's date: 12/03/2021 Total lbs lost to date: 36 Total lbs lost since last in-office visit: 2  Interim History: Brandon Rich, which is Brandon Rich's 49 year old dog, is ill and it's causing a lot of stress. Brandon Rich is here to review labs that were drawn at his last office visit.  Subjective:   1. Type 2 diabetes mellitus without complication, with long-term current use of insulin ([facility]) Brandon Rich's A1c was 6.3 from 5 months ago and is now 7.1. He is on Mounjaro, insulin, Glucophage, and Jardiance.  2. NAFLD (nonalcoholic fatty liver disease) Brandon Rich has a diagnosis of nonalcoholic fatty liver disease. Labs were discussed with him today. He denies use of ETOH.  3. Vitamin D deficiency Brandon Rich is tolerating medication(s) well without side effects. Medication compliance is good as patient endorses taking it as prescribed. The patient denies additional concerns regarding this condition.       4. At risk for impaired function of liver Brandon Rich is at risk for impaired liver function due to his liver enzymes are worsening from prior labs.  Assessment/Plan:  No orders of the defined types were placed in this encounter.   Medications Discontinued During This Encounter  Medication Reason   Vitamin D, Ergocalciferol, (DRISDOL) 1.25 MG (50000 UNIT) CAPS capsule Reorder     Meds ordered this encounter  Medications   Vitamin D, Ergocalciferol, (DRISDOL) 1.25 MG (50000 UNIT) CAPS capsule    Sig: Take 1 capsule (50,000 Units total) by mouth every 7 (seven)  days.    Dispense:  4 capsule    Refill:  0    30 d supply;  ** OV for RF **   Do not send RF request     1. Type 2 diabetes mellitus without complication, with long-term current use of insulin (HCC) Brandon Rich's A1c is worsening, He agrees to continue taking Mounjaro per his PCP. We had a long discussion about foods and effects of blood sugar. Good blood sugar control is important to decrease the likelihood of diabetic complications such as nephropathy, neuropathy, limb loss, blindness, coronary artery disease, and death. Intensive lifestyle modification including diet, exercise and weight loss are the first line of treatment for diabetes.   2. NAFLD (nonalcoholic fatty liver disease) Brandon Rich's ALT is worsening and is in the 50's again. Counseling was done.  3. Vitamin D deficiency Brandon Rich's vitamin D is at goal and he agrees to continue the same dose. We can only recheck his vitamin D level every 6 months due to insurance coverage. Low Vitamin D level contributes to fatigue and are associated with obesity, breast, and colon cancer. He agrees to continue to take prescription Vitamin D '@50'$ ,000 IU every week and will follow-up for routine testing of Vitamin D, at least 2-3 times per year to avoid over-replacement.  - Vitamin D, Ergocalciferol, (DRISDOL) 1.25 MG (50000 UNIT) CAPS capsule; Take 1 capsule (50,000 Units total) by mouth every 7 (seven) days.  Dispense: 4 capsule; Refill: 0  4. At risk for impaired function of liver Brandon Rich was given approximately  10 minutes of counseling today regarding prevention of impaired liver function. Brandon Rich was educated about his risk of developing NASH or even liver failure and advised that the only proven treatment for NAFLD was weight loss of at least 5-10% of body weight.    5. Obesity, Current BMI 40.5 Brandon Rich's goal is to increase exercise 30 minutes a day for 5 days a week and to keep better track of snack calories. He is going on vacation for 1 week and  his goal is not to gain weight.  Brandon Rich is currently in the action stage of change. As such, his goal is to continue with weight loss efforts. He has agreed to the Category 4 Plan.   Exercise goals: For substantial health benefits, adults should do at least 150 minutes (2 hours and 30 minutes) a week of moderate-intensity, or 75 minutes (1 hour and 15 minutes) a week of vigorous-intensity aerobic physical activity, or an equivalent combination of moderate- and vigorous-intensity aerobic activity. Aerobic activity should be performed in episodes of at least 10 minutes, and preferably, it should be spread throughout the week.  Behavioral modification strategies: increasing lean protein intake, decreasing simple carbohydrates, and planning for success.  Brandon Rich has agreed to follow-up with our clinic in 3 weeks for a fasting IC. He was informed of the importance of frequent follow-up visits to maximize his success with intensive lifestyle modifications for his multiple health conditions.   Objective:   Blood pressure 122/71, pulse 72, temperature 98 F (36.7 C), height '5\' 8"'$  (1.727 m), weight 266 lb (120.7 kg), SpO2 98 %. Body mass index is 40.45 kg/m.  General: Cooperative, alert, well developed, in no acute distress. HEENT: Conjunctivae and lids unremarkable. Cardiovascular: Regular rhythm.  Lungs: Normal work of breathing. Neurologic: No focal deficits.   Lab Results  Component Value Date   CREATININE 0.90 11/12/2021   BUN 23 11/12/2021   NA 137 11/12/2021   K 4.2 11/12/2021   CL 100 11/12/2021   CO2 22 11/12/2021   Lab Results  Component Value Date   ALT 25 11/12/2021   AST 50 (H) 11/12/2021   ALKPHOS 60 11/12/2021   BILITOT 0.4 11/12/2021   Lab Results  Component Value Date   HGBA1C 7.1 (H) 11/12/2021   HGBA1C 6.3 (H) 06/17/2021   HGBA1C 6.5 (H) 03/12/2021   HGBA1C 7.7 (H) 12/04/2020   HGBA1C 7.4 (H) 06/27/2020   Lab Results  Component Value Date   INSULIN 5.2  12/04/2020   Lab Results  Component Value Date   TSH 1.550 12/04/2020   Lab Results  Component Value Date   CHOL 105 07/03/2021   HDL 35.90 (L) 07/03/2021   LDLCALC 47 07/03/2021   TRIG 110.0 07/03/2021   CHOLHDL 3 07/03/2021   Lab Results  Component Value Date   VD25OH 54.7 11/12/2021   VD25OH 49.8 06/17/2021   VD25OH 62.7 03/12/2021   Lab Results  Component Value Date   WBC 9.0 07/03/2021   HGB 14.9 07/03/2021   HCT 45.5 07/03/2021   MCV 82.5 07/03/2021   PLT 251.0 07/03/2021   No results found for: IRON, TIBC, FERRITIN  Attestation Statements:   Reviewed by clinician on day of visit: allergies, medications, problem list, medical history, surgical history, family history, social history, and previous encounter notes.  IMarcille Blanco, CMA, am acting as transcriptionist for [employer], DO  I have reviewed the above documentation for accuracy and completeness, and I agree with the above. -  Marjory Sneddon,  D.O.  The 21st Century Cures Act was signed into law in 2016 which includes the topic of electronic health records.  This provides immediate access to information in MyChart.  This includes consultation notes, operative notes, office notes, lab results and pathology reports.  If you have any questions about what you read please let us know at your next visit so we can discuss your concerns and take corrective action if need be.  We are right here with you.

## 2021-12-13 ENCOUNTER — Other Ambulatory Visit: Payer: Self-pay | Admitting: Family Medicine

## 2021-12-13 DIAGNOSIS — I10 Essential (primary) hypertension: Secondary | ICD-10-CM

## 2021-12-13 DIAGNOSIS — E119 Type 2 diabetes mellitus without complications: Secondary | ICD-10-CM

## 2021-12-31 ENCOUNTER — Ambulatory Visit (INDEPENDENT_AMBULATORY_CARE_PROVIDER_SITE_OTHER): Payer: Managed Care, Other (non HMO) | Admitting: Family Medicine

## 2021-12-31 ENCOUNTER — Encounter (INDEPENDENT_AMBULATORY_CARE_PROVIDER_SITE_OTHER): Payer: Self-pay | Admitting: Family Medicine

## 2021-12-31 VITALS — BP 120/76 | HR 104 | Temp 97.6°F | Ht 68.0 in | Wt 267.0 lb

## 2021-12-31 DIAGNOSIS — Z6841 Body Mass Index (BMI) 40.0 and over, adult: Secondary | ICD-10-CM | POA: Diagnosis not present

## 2021-12-31 DIAGNOSIS — Z9189 Other specified personal risk factors, not elsewhere classified: Secondary | ICD-10-CM

## 2021-12-31 DIAGNOSIS — E1169 Type 2 diabetes mellitus with other specified complication: Secondary | ICD-10-CM

## 2021-12-31 DIAGNOSIS — R0602 Shortness of breath: Secondary | ICD-10-CM | POA: Diagnosis not present

## 2021-12-31 DIAGNOSIS — Z794 Long term (current) use of insulin: Secondary | ICD-10-CM

## 2021-12-31 DIAGNOSIS — E669 Obesity, unspecified: Secondary | ICD-10-CM | POA: Diagnosis not present

## 2021-12-31 DIAGNOSIS — E559 Vitamin D deficiency, unspecified: Secondary | ICD-10-CM

## 2021-12-31 DIAGNOSIS — Z7984 Long term (current) use of oral hypoglycemic drugs: Secondary | ICD-10-CM

## 2021-12-31 DIAGNOSIS — Z7985 Long-term (current) use of injectable non-insulin antidiabetic drugs: Secondary | ICD-10-CM

## 2021-12-31 MED ORDER — VITAMIN D (ERGOCALCIFEROL) 1.25 MG (50000 UNIT) PO CAPS: 50000.0000 [IU] | ORAL_CAPSULE | ORAL | 0 refills | Status: DC

## 2021-12-31 MED ORDER — TIRZEPATIDE 12.5 MG/0.5ML ~~LOC~~ SOAJ
12.5000 mg | SUBCUTANEOUS | 0 refills | Status: DC
  Filled 2022-02-10: qty 2, 28d supply, fill #0
  Filled 2022-03-01: qty 2, 28d supply, fill #1

## 2022-01-01 ENCOUNTER — Ambulatory Visit (INDEPENDENT_AMBULATORY_CARE_PROVIDER_SITE_OTHER): Payer: Managed Care, Other (non HMO) | Admitting: Family Medicine

## 2022-01-01 VITALS — BP 134/68 | HR 74 | Temp 98.0°F | Resp 16 | Ht 68.0 in | Wt 273.6 lb

## 2022-01-01 DIAGNOSIS — E119 Type 2 diabetes mellitus without complications: Secondary | ICD-10-CM

## 2022-01-01 DIAGNOSIS — F4323 Adjustment disorder with mixed anxiety and depressed mood: Secondary | ICD-10-CM

## 2022-01-01 DIAGNOSIS — Z1211 Encounter for screening for malignant neoplasm of colon: Secondary | ICD-10-CM

## 2022-01-01 DIAGNOSIS — E785 Hyperlipidemia, unspecified: Secondary | ICD-10-CM

## 2022-01-01 DIAGNOSIS — E1142 Type 2 diabetes mellitus with diabetic polyneuropathy: Secondary | ICD-10-CM | POA: Diagnosis not present

## 2022-01-01 DIAGNOSIS — F5104 Psychophysiologic insomnia: Secondary | ICD-10-CM | POA: Diagnosis not present

## 2022-01-01 DIAGNOSIS — F411 Generalized anxiety disorder: Secondary | ICD-10-CM

## 2022-01-01 DIAGNOSIS — I10 Essential (primary) hypertension: Secondary | ICD-10-CM

## 2022-01-01 DIAGNOSIS — Z794 Long term (current) use of insulin: Secondary | ICD-10-CM

## 2022-01-01 DIAGNOSIS — N529 Male erectile dysfunction, unspecified: Secondary | ICD-10-CM

## 2022-01-01 MED ORDER — SERTRALINE HCL 100 MG PO TABS: 150.0000 mg | ORAL_TABLET | Freq: Every day | ORAL | 1 refills | Status: DC

## 2022-01-01 MED ORDER — HYDROXYZINE HCL 25 MG PO TABS: 12.5000 mg | ORAL_TABLET | Freq: Every evening | ORAL | 1 refills | Status: DC | PRN

## 2022-01-01 MED ORDER — METFORMIN HCL 1000 MG PO TABS: ORAL_TABLET | ORAL | 1 refills | Status: DC

## 2022-01-01 MED ORDER — GABAPENTIN 100 MG PO CAPS: 100.0000 mg | ORAL_CAPSULE | Freq: Every day | ORAL | 5 refills | Status: DC

## 2022-01-01 MED ORDER — SILDENAFIL CITRATE 100 MG PO TABS: 50.0000 mg | ORAL_TABLET | Freq: Every day | ORAL | 11 refills | Status: DC | PRN

## 2022-01-01 MED ORDER — ATORVASTATIN CALCIUM 20 MG PO TABS: 20.0000 mg | ORAL_TABLET | Freq: Every day | ORAL | 1 refills | Status: DC

## 2022-01-01 NOTE — Progress Notes (Unsigned)
Subjective:  Patient ID: Brandon Rich, male    DOB: October 10, 1960  Age: 61 y.o. MRN: [id number]  CC:  Chief Complaint  Patient presents with   Anxiety    GAD-7=2 and PHQ-9=2 therapist has recommended sertraline be increased to '150mg'$  wanted to discuss if you would be willing to do this today    Hyperlipidemia   Diabetes   Erectile Dysfunction    Pt is asking if you would be willing to refill sildenafil as Rx from old urologist has expired, pt reports 1xweek average use     HPI Brandon Rich presents for   Diabetes: Complicated by obesity, likely diabetic neuropathy based on foot exam below. Some pain in toes comes and goes, tingling comes and goes - numbness persistent.  Insulin-dependent. Followed by endocrinology - Dr. Buddy Duty, and weight mgt - Dr. Raliegh Scarlet. Ov yesterday, tirzepatide increased to 12.'5mg'$ .  On ACE-I and statin. Wt Readings from Last 3 Encounters:  01/01/22 273 lb 9.6 oz (124.1 kg)  12/31/21 267 lb (121.1 kg)  12/03/21 266 lb (120.7 kg)    History of toe amputation.  Diabetic Foot Exam - Simple   Simple Foot Form Visual Inspection See comments: Yes Sensation Testing See comments: Yes Pulse Check Posterior Tibialis and Dorsalis pulse intact bilaterally: Yes Comments Pt has strong pulse in both feet, Rt foot has amputation of the Great and second toes, notes no sensation in last 3 toes of Rt foot. Notes no sensation in Lt Great through 3rd toe light sensation in 4th and 5th toes. No sensation in either arch or ball of foot    Lab Results  Component Value Date   HGBA1C 7.1 (H) 11/12/2021   HGBA1C 6.3 (H) 06/17/2021   HGBA1C 6.5 (H) 03/12/2021   Lab Results  Component Value Date   MICROALBUR 1.3 06/03/2016   LDLCALC 47 07/03/2021   CREATININE 0.90 11/12/2021   Hypertension: With underlying OSA on CPAP.  Followed by sleep specialist.  Lisinopril 40 mg daily.cardiologist Dr. Percival Spanish. No new side effects.  Home readings: 130/75-85 BP Readings from  Last 3 Encounters:  01/01/22 134/68  12/31/21 120/76  12/03/21 122/71   Lab Results  Component Value Date   CREATININE 0.90 11/12/2021   Hyperlipidemia: Lipitor 20 mg daily. No new myalgias.  Lab Results  Component Value Date   CHOL 105 07/03/2021   HDL 35.90 (L) 07/03/2021   LDLCALC 47 07/03/2021   TRIG 110.0 07/03/2021   CHOLHDL 3 07/03/2021   Lab Results  Component Value Date   ALT 25 11/12/2021   AST 50 (H) 11/12/2021   ALKPHOS 60 11/12/2021   BILITOT 0.4 11/12/2021    Erectile dysfunction Has been treated with sildenafil  '100mg'$ . previously prescribed by urologist.  Denies vision changes, hearing changes, or chest pain with exertion.  Depression: With anxiety, followed by psychologist.  Has noted recent visits recommended higher dosing of medication.  Currently taking sertraline 100 mg daily.  Hydroxyzine as needed for anxiety/sleep.still using hydroxyzine - working well without new side effects.  Major anxieties better, but still some persistent anxiety.      01/01/2022    8:48 AM 12/26/2020    8:41 AM 06/29/2019    8:18 AM 05/18/2019    2:13 PM  GAD 7 : Generalized Anxiety Score  Nervous, Anxious, on Edge 1 0 0 0  Control/stop worrying 0 0 1 1  Worry too much - different things 1 0 1 1  Trouble relaxing 0 0 0 0  Restless 0 0 0 1  Easily annoyed or irritable 0 0 0 1  Afraid - awful might happen 0 0 0 0  Total GAD 7 Score 2 0 2 4         01/01/2022    8:49 AM 07/03/2021    8:00 AM 12/26/2020    8:41 AM 12/04/2020    7:49 AM 06/27/2020    8:43 AM  Depression screen PHQ 2/9  Decreased Interest 0 0 0 2 0  Down, Depressed, Hopeless 1 0 0 1 0  PHQ - 2 Score 1 0 0 3 0  Altered sleeping 1 0 1 1   Tired, decreased energy 0 0 0 3   Change in appetite 0 0 1 1   Feeling bad or failure about yourself  0 0 0 1   Trouble concentrating 0 0 0 1   Moving slowly or fidgety/restless 0 0 0 1   Suicidal thoughts 0 0 0 0   PHQ-9 Score 2 0 2 11   Difficult doing  work/chores  Not difficult at all  Somewhat difficult    Screening options with colonoscopy versus Cologuard discussed. Discussed timing of repeat testing intervals if normal, as well as potential need for diagnostic Colonoscopy if positive Cologuard. Understanding expressed, and chose Colonoscopy.     History Patient Active Problem List   Diagnosis Date Noted   Polyphagia 08/26/2021   Class 3 severe obesity with serious comorbidity and body mass index (BMI) of 45.0 to 49.9 in adult ([place]) 49/67/5916   Nonalcoholic hepatosteatosis 38/46/6599   Other fatigue 12/04/2020   SOBOE (shortness of breath on exertion) 12/04/2020   Vitamin D deficiency 12/04/2020   Depression 12/04/2020   Chest discomfort 02/02/2019   Morbid obesity ([place]) 02/02/2019   Pain in right hand 02/15/2018   Trigger finger 02/15/2018   Cortical age-related cataract of both eyes 03/30/2017   Nuclear sclerotic cataract of both eyes 03/30/2017   Primary open angle glaucoma (POAG) of both eyes, indeterminate stage 03/30/2017   Type 2 diabetes mellitus without complication, without long-term current use of insulin ([place]) 03/30/2017   LVH (left ventricular hypertrophy) 03/29/2016   FUO (fever of unknown origin) 02/16/2016   Acute combined systolic and diastolic heart failure ([place]) 02/16/2016   Weight gain 02/16/2016   Weight loss 02/16/2016   Rash and nonspecific skin eruption 02/16/2016   DOE (dyspnea on exertion) 02/16/2016   T wave inversion in EKG 02/16/2016   Transaminitis 02/16/2016   Arthritis of big toe 08/23/2013   Pain of left great toe 08/23/2013   Hypertension associated with type 2 diabetes mellitus ([place]) 12/20/2011   Diabetes mellitus ([place]) 12/20/2011   Hyperlipidemia associated with type 2 diabetes mellitus ([place]) 12/20/2011   Glaucoma (increased eye pressure) 07/19/1996   Past Medical History:  Diagnosis Date   Acute combined systolic and diastolic heart failure ([place]) 02/16/2016   Allergy    Anxiety     Phreesia 12/27/2019   Anxiety    DDD (degenerative disc disease), cervical    Depression    Diabetes mellitus    Diabetes mellitus without complication ([place])    Phreesia 12/27/2019   Diabetic foot ulcer (HCC)    FUO (fever of unknown origin) 02/16/2016   Glaucoma    Heartburn    Hyperlipidemia    Hypertension    LVH (left ventricular hypertrophy) 03/29/2016   Palpitation    Rash and nonspecific skin eruption 02/16/2016   Sleep apnea    CPAP  SOBOE (shortness of breath on exertion)    Transaminitis 02/16/2016   Past Surgical History:  Procedure Laterality Date   AMPUTATION TOE  04/2020   AMPUTATION TOE  01/2020   HERNIA REPAIR     SPINE SURGERY     L4/L5   TUMOR REMOVAL  12/1998   Bone Cyst   Allergies  Allergen Reactions   Other Other (See Comments)    Hayfever   Prior to Admission medications   Medication Sig Start Date End Date Taking? Authorizing Provider  Ascorbic Acid (VITAMIN C) 1000 MG tablet Take 1,000 mg by mouth daily.   Yes [provider]  aspirin 81 MG tablet Take 81 mg by mouth daily.   Yes [provider]  atorvastatin (LIPITOR) 20 MG tablet Take 1 tablet (20 mg total) by mouth daily. 07/03/21  Yes [name], MD  BD PEN NEEDLE NANO U/F 32G X 4 MM MISC  07/04/18  Yes [provider]  calcium carbonate (TUMS - DOSED IN MG ELEMENTAL CALCIUM) 500 MG chewable tablet Chew 2 tablets by mouth daily as needed for indigestion or heartburn.   Yes [provider]  dorzolamide-timolol (COSOPT) 22.3-6.8 MG/ML ophthalmic solution 1 drop 2 (two) times daily.   Yes [provider]  empagliflozin (JARDIANCE) 25 MG TABS tablet Take 25 mg by mouth daily. 12/16/16  Yes [name], MD  fexofenadine (ALLEGRA) 180 MG tablet Take 180 mg by mouth daily.   Yes [provider]  fluticasone (FLONASE) 50 MCG/ACT nasal spray PLACE 2 SPRAYS INTO BOTH NOSTRILS DAILY. 07/09/14  Yes Guest, Benn Moulder, MD  hydrOXYzine (ATARAX) 25  MG tablet Take 0.5-1 tablets (12.5-25 mg total) by mouth at bedtime as needed for anxiety. 07/03/21  Yes [name], MD  Insulin Detemir (LEVEMIR FLEXPEN [place]) Inject 40 Units into the skin daily.   Yes [provider]  lisinopril (ZESTRIL) 40 MG tablet Take 1 tablet (40 mg total) by mouth daily. 07/03/21  Yes [name], MD  metFORMIN (GLUCOPHAGE) 1000 MG tablet TAKE ONE TABLET BY MOUTH TWICE A DAY 12/15/21  Yes [name], MD  Multiple Vitamin (MULTIVITAMIN) tablet Take 1 tablet by mouth every evening.    Yes [provider]  sertraline (ZOLOFT) 100 MG tablet Take 1 tablet (100 mg total) by mouth daily. 07/03/21  Yes [name], MD  tirzepatide [facility]) 12.5 MG/0.5ML Pen Inject 12.5 mg into the skin once a week. 12/31/21  Yes Opalski, Deborah, DO  Travoprost, BAK Free, (TRAVATAN) 0.004 % SOLN ophthalmic solution SMARTSIG:In Eye(s) 01/28/21  Yes [provider]  Vitamin D, Ergocalciferol, (DRISDOL) 1.25 MG (50000 UNIT) CAPS capsule Take 1 capsule (50,000 Units total) by mouth every 7 (seven) days. 12/31/21  Yes Opalski, Neoma Laming, DO   Social History   Socioeconomic History   Marital status: Married    Spouse name: Kunio Cummiskey   Number of children: 2   Years of education: 16   Highest education level: Not on file  Occupational History   Occupation: Tour manager  Tobacco Use   Smoking status: Never   Smokeless tobacco: Never  Vaping Use   Vaping Use: Never used  Substance and Sexual Activity   Alcohol use: No    Alcohol/week: 0.0 standard drinks of alcohol    Comment: per pt 1 drink once in a long while   Drug use: No   Sexual activity: Yes    Partners: Female  Other Topics Concern   Not  on file  Social History Narrative   Married. Education: [employer].    Social Determinants of Health   Financial Resource Strain: Not on file  Food Insecurity: Not on file  Transportation Needs: Not on file  Physical Activity: Not on  file  Stress: Not on file  Social Connections: Not on file  Intimate Partner Violence: Not on file    Review of Systems  Constitutional:  Negative for fatigue and unexpected weight change.  Eyes:  Negative for visual disturbance.  Respiratory:  Negative for cough, chest tightness and shortness of breath.   Cardiovascular:  Negative for chest pain, palpitations and leg swelling.  Gastrointestinal:  Negative for abdominal pain and blood in stool.  Neurological:  Negative for dizziness, light-headedness and headaches.     Objective:   Vitals:   01/01/22 0846  BP: 134/68  Pulse: 74  Resp: 16  Temp: 98 F (36.7 C)  TempSrc: Temporal  SpO2: 95%  Weight: 273 lb 9.6 oz (124.1 kg)  Height: '5\' 8"'$  (1.727 m)     Physical Exam Vitals reviewed.  Constitutional:      Appearance: He is well-developed.  HENT:     Head: Normocephalic and atraumatic.  Neck:     Vascular: No carotid bruit or JVD.  Cardiovascular:     Rate and Rhythm: Normal rate and regular rhythm.     Heart sounds: Normal heart sounds. No murmur heard. Pulmonary:     Effort: Pulmonary effort is normal.     Breath sounds: Normal breath sounds. No rales.  Musculoskeletal:     Right lower leg: No edema.     Left lower leg: No edema.  Skin:    General: Skin is warm and dry.  Neurological:     Mental Status: He is alert and oriented to person, place, and time.  Psychiatric:        Mood and Affect: Mood normal.        Assessment & Plan:  HAROL SHABAZZ is a 61 y.o. male . Diabetic polyneuropathy associated with type 2 diabetes mellitus ([place]) - Plan: gabapentin (NEURONTIN) 100 MG capsule  -Suspected new diagnosis of diabetic neuropathy with underlying diabetes and foot symptoms as above.  Start low-dose gabapentin with option to increase if needed.  Potential side effects discussed, continue follow-up with endocrinology.  Psychophysiological insomnia - Plan: hydrOXYzine (ATARAX) 25 MG tablet  -Stable with  hydroxyzine, continue same  Adjustment disorder with mixed anxiety and depressed mood - Plan: hydrOXYzine (ATARAX) 25 MG tablet Anxiety state - Plan: sertraline (ZOLOFT) 100 MG tablet  -Improved but still some room for improvement and will try higher dose of sertraline at 150 mg daily with potential side effects discussed.  Continue follow-up with therapist.  Hydroxyzine as above as needed.  Hyperlipidemia, unspecified hyperlipidemia type - Plan: atorvastatin (LIPITOR) 20 MG tablet, Lipid panel  -Tolerating current regimen, fasting lipid panel planned as lab visit or can be checked through healthy weight and wellness  Essential hypertension - Plan: atorvastatin (LIPITOR) 20 MG tablet, metFORMIN (GLUCOPHAGE) 1000 MG tablet  -Stable with lisinopril, continue same.  Type 2 diabetes mellitus without complication, with long-term current use of insulin ([place]) - Plan: metFORMIN (GLUCOPHAGE) 1000 MG tablet  -Continues to work on control with endocrinology.  Borderline control with last A1c 7.1.  Continue follow-up with endocrine, gabapentin started as above for neuropathy.    Erectile dysfunction, unspecified erectile dysfunction type - Plan: sildenafil (VIAGRA) 100 MG tablet  -Lowest effective dose of Viagra with  potential side effects and risks discussed, but advised urology follow-up if ineffective at max dose. viagra Rx given - use lowest effective dose. Side effects discussed (including but not limited to headache/flushing, blue discoloration of vision, possible vascular steal and risk of cardiac effects if underlying unknown coronary artery disease, and permanent sensorineural hearing loss). Understanding expressed.  Screen for colon cancer - Plan: Ambulatory referral to Gastroenterology   Meds ordered this encounter  Medications   hydrOXYzine (ATARAX) 25 MG tablet    Sig: Take 0.5-1 tablets (12.5-25 mg total) by mouth at bedtime as needed for anxiety.    Dispense:  90 tablet    Refill:  1    atorvastatin (LIPITOR) 20 MG tablet    Sig: Take 1 tablet (20 mg total) by mouth daily.    Dispense:  90 tablet    Refill:  1   sertraline (ZOLOFT) 100 MG tablet    Sig: Take 1.5 tablets (150 mg total) by mouth daily.    Dispense:  135 tablet    Refill:  1   metFORMIN (GLUCOPHAGE) 1000 MG tablet    Sig: TAKE ONE TABLET BY MOUTH TWICE A DAY    Dispense:  180 tablet    Refill:  1   gabapentin (NEURONTIN) 100 MG capsule    Sig: Take 1-2 capsules (100-200 mg total) by mouth at bedtime.    Dispense:  60 capsule    Refill:  5   sildenafil (VIAGRA) 100 MG tablet    Sig: Take 0.5-1 tablets (50-100 mg total) by mouth daily as needed for erectile dysfunction.    Dispense:  5 tablet    Refill:  11   Patient Instructions  Gabapentin at bedtime may help nerve pain.  Lowest effective dose of viagra. If not effective, then will need to meet with urology.  Increase zoloft to '150mg'$  - let me know if any new side effects Fasting lipid test in next month. Can have done with weight management or walk in at our [place] lab: [place] in 8:30-4:30 during weekdays, no appointment needed [place].  [place], [place] [id number].   Take care. See you in 3 months.     Signed,   [name], MD [place] 01/04/22 8:57 AM

## 2022-01-01 NOTE — Patient Instructions (Addendum)
Gabapentin at bedtime may help nerve pain.  Lowest effective dose of viagra. If not effective, then will need to meet with urology.  Increase zoloft to '150mg'$  - let me know if any new side effects Fasting lipid test in next month. Can have done with weight management or walk in at our [facility] lab: [place] in 8:30-4:30 during weekdays, no appointment needed [place].  [place], [place] [id number].   Take care. See you in 3 months.

## 2022-01-04 ENCOUNTER — Encounter: Payer: Self-pay | Admitting: Family Medicine

## 2022-01-06 NOTE — Progress Notes (Unsigned)
Chief Complaint:   OBESITY Faizaan is here to discuss his progress with his obesity treatment plan along with follow-up of his obesity related diagnoses. Taz is on the Category 4 Plan and states he is following his eating plan approximately 80% of the time. Cosimo states he is using stationary bike 30 minutes 3 times per week.  Today's visit was #: 20 Starting weight: 302 lbs Starting date: 12/04/2020 Today's weight: 267 lbs Today's date: 12/31/2021 Total lbs lost to date: 35 Total lbs lost since last in-office visit: +1  Interim History: Lud[place] had his birthday [place] He [place] gained 1 pound. Pt is here for IC.  Subjective:   1. SOB (shortness of breath) on exertion Malekai notes increasing shortness of breath with exercising and seems to be worsening over time with weight gain. He notes getting out of breath sooner with activity than he used to. This has gotten worse recently. Lang denies shortness of breath at rest or orthopnea.  2. Type 2 diabetes mellitus with other specified complication, with long-term current use of insulin (HCC) Dorrien's fasting blood sugars run in the 120's. Her latest A1c was over 7.0. medication: Jardiance, Metformin, Mounjaro, Levemir  3. Vitamin D deficiency Vitamin D level was at goal a month ago.   4. At risk for hyperglycemia Mykeal has a history of some elevated blood glucose readings without a diagnosis of diabetes. He denies polyphagia.  Assessment/Plan:   1. SOB (shortness of breath) on exertion Werner does feel that he gets out of breath more easily that he used to when he exercises. Ryker's shortness of breath appears to be obesity related and exercise induced. He has agreed to work on weight loss and gradually increase exercise to treat his exercise induced shortness of breath. Will continue to monitor closely.  Pt's last REE in May 2022 was 2920 and today it is 2678. We will need to  decrease intake and change to category 3. Pt given banded exercises and band.  2. Type 2 diabetes mellitus with other specified complication, with long-term current use of insulin (HCC) Good blood sugar control is important to decrease the likelihood of diabetic complications such as nephropathy, neuropathy, limb loss, blindness, coronary artery disease, and death. Intensive lifestyle modification including diet, exercise and weight loss are the first line of treatment for diabetes. Increase Mounjaro to 12.5 mg weekly.  3. Vitamin D deficiency Low Vitamin D level contributes to fatigue and are associated with obesity, breast, and colon cancer. He agrees to continue to take prescription Vitamin D '@50'$ ,000 IU every week and will follow-up for routine testing of Vitamin D, at least 2-3 times per year to avoid over-replacement.  Refill- Vitamin D, Ergocalciferol, (DRISDOL) 1.25 MG (50000 UNIT) CAPS capsule; Take 1 capsule (50,000 Units total) by mouth every 7 (seven) days.  Dispense: 4 capsule; Refill: 0  4. At risk for hyperglycemia Jandiel was given approximately 9 minutes of counseling today regarding detriments to health with hyperglycemia and regarding the prevention of hyperglycemia.  He was advised of various red flag symptoms associated with hyperglycemia and the fact that often, hyperglycemia can be asymptomatic.  Cordera was instructed to avoid skipping foods/ meals, to have proteins with each meal and to avoid eating only carb-rich foods. The patient should be checking FBS's, 2 hr PP BS's and also check blood sugars any time they do not feel well- especially with new onset nausea/ vomiting and abdominal pain, excessive thirst  or hunger, tachycardia etc. Importance of regular follow-ups with their PCP or Endocrinologist, in addition to our visits, was stressed to patient.  5. Obesity, Current BMI 40.7 Ghassan is currently in the action stage of change. As such, his goal is to continue with weight  loss efforts. He has agreed to change to the Category 3 Plan.   Pt's last REE in May 2022 was 2920 and today it is 2678. We will need to decrease intake and change to category 3. Pt given banded exercises and band.  Exercise goals:  Exercise for 30 minutes 4 days a week.  Behavioral modification strategies: increasing lean protein intake and decreasing simple carbohydrates.  Hilmer has agreed to follow-up with our clinic in 3 weeks. He was informed of the importance of frequent follow-up visits to maximize his success with intensive lifestyle modifications for his multiple health conditions.   Objective:   Blood pressure 120/76, pulse (!) 104, temperature 97.6 F (36.4 C), height '5\' 8"'$  (1.727 m), weight 267 lb (121.1 kg), SpO2 96 %. Body mass index is 40.6 kg/m.  General: Cooperative, alert, well developed, in no acute distress. HEENT: Conjunctivae and lids unremarkable. Cardiovascular: Regular rhythm.  Lungs: Normal work of breathing. Neurologic: No focal deficits.   Lab Results  Component Value Date   CREATININE 0.90 11/12/2021   BUN 23 11/12/2021   NA 137 11/12/2021   K 4.2 11/12/2021   CL 100 11/12/2021   CO2 22 11/12/2021   Lab Results  Component Value Date   ALT 25 11/12/2021   AST 50 (H) 11/12/2021   ALKPHOS 60 11/12/2021   BILITOT 0.4 11/12/2021   Lab Results  Component Value Date   HGBA1C 7.1 (H) 11/12/2021   HGBA1C 6.3 (H) 06/17/2021   HGBA1C 6.5 (H) 03/12/2021   HGBA1C 7.7 (H) 12/04/2020   HGBA1C 7.4 (H) 06/27/2020   Lab Results  Component Value Date   INSULIN 5.2 12/04/2020   Lab Results  Component Value Date   TSH 1.550 12/04/2020   Lab Results  Component Value Date   CHOL 105 07/03/2021   HDL 35.90 (L) 07/03/2021   LDLCALC 47 07/03/2021   TRIG 110.0 07/03/2021   CHOLHDL 3 07/03/2021   Lab Results  Component Value Date   VD25OH 54.7 11/12/2021   VD25OH 49.8 06/17/2021   VD25OH 62.7 03/12/2021   Lab Results  Component Value Date    WBC 9.0 07/03/2021   HGB 14.9 07/03/2021   HCT 45.5 07/03/2021   MCV 82.5 07/03/2021   PLT 251.0 07/03/2021    Attestation Statements:   Reviewed by clinician on day of visit: allergies, medications, problem list, medical history, surgical history, family history, social history, and previous encounter notes.  I, Kathlene November, BS, CMA, am acting as transcriptionist for [employer], DO.  I have reviewed the above documentation for accuracy and completeness, and I agree with the above. -  ***

## 2022-01-15 ENCOUNTER — Other Ambulatory Visit (INDEPENDENT_AMBULATORY_CARE_PROVIDER_SITE_OTHER): Payer: Managed Care, Other (non HMO)

## 2022-01-15 DIAGNOSIS — E785 Hyperlipidemia, unspecified: Secondary | ICD-10-CM

## 2022-01-15 LAB — LIPID PANEL
Cholesterol: 128 mg/dL (ref 0–200)
HDL: 31.4 mg/dL — ABNORMAL LOW (ref >39.00)
LDL Cholesterol: 62 mg/dL (ref 0–99)
NonHDL: 96.93
Total CHOL/HDL Ratio: 4
Triglycerides: 175 mg/dL — ABNORMAL HIGH (ref 0.0–149.0)
VLDL: 35 mg/dL (ref 0.0–40.0)

## 2022-01-21 ENCOUNTER — Encounter (INDEPENDENT_AMBULATORY_CARE_PROVIDER_SITE_OTHER): Payer: Self-pay | Admitting: Family Medicine

## 2022-01-21 ENCOUNTER — Ambulatory Visit (INDEPENDENT_AMBULATORY_CARE_PROVIDER_SITE_OTHER): Payer: Managed Care, Other (non HMO) | Admitting: Family Medicine

## 2022-01-21 VITALS — BP 115/74 | HR 69 | Temp 98.5°F | Ht 68.0 in | Wt 266.0 lb

## 2022-01-21 DIAGNOSIS — Z7985 Long-term (current) use of injectable non-insulin antidiabetic drugs: Secondary | ICD-10-CM

## 2022-01-21 DIAGNOSIS — E669 Obesity, unspecified: Secondary | ICD-10-CM

## 2022-01-21 DIAGNOSIS — E1159 Type 2 diabetes mellitus with other circulatory complications: Secondary | ICD-10-CM

## 2022-01-21 DIAGNOSIS — Z7984 Long term (current) use of oral hypoglycemic drugs: Secondary | ICD-10-CM

## 2022-01-21 DIAGNOSIS — Z6841 Body Mass Index (BMI) 40.0 and over, adult: Secondary | ICD-10-CM | POA: Diagnosis not present

## 2022-01-21 DIAGNOSIS — Z794 Long term (current) use of insulin: Secondary | ICD-10-CM

## 2022-01-21 DIAGNOSIS — E559 Vitamin D deficiency, unspecified: Secondary | ICD-10-CM

## 2022-01-21 MED ORDER — VITAMIN D (ERGOCALCIFEROL) 1.25 MG (50000 UNIT) PO CAPS: 50000.0000 [IU] | ORAL_CAPSULE | ORAL | 0 refills | Status: DC

## 2022-01-22 NOTE — Progress Notes (Unsigned)
Chief Complaint:   OBESITY Brandon Rich is here to discuss his progress with his obesity treatment plan along with follow-up of his obesity related diagnoses. Brandon Rich is on the Category 3 Plan and states he is following his eating plan approximately 90% of the time. Brandon Rich states he is riding his recumbent bike and using exercise bands 50 minutes 3-4  times per week.  Today's visit was #: 21 Starting weight: 302 lbs Starting date: 12/04/2020 Today's weight: 266 lbs Today's date: 01/21/2022 Total lbs lost to date: 36 lbs Total lbs lost since last in-office visit: 1 lb  Interim History: Brandon Rich gained 5 lbs about 2 weeks ago.  He has been back on meal plan for 1-1/2 weeks and has lost 6 lbs.  We changed to Category 3 last office visit.   Subjective:   1. Type 2 diabetes mellitus with other circulatory complication, without long-term current use of insulin ([place]) Discussed labs with [name] Como today. A1c was worse this time vs last time. Fasting blood sugars are ranging from 110-118.  Mounjaro was increased to 12.5 at last office visit, he has tolerated it well.  He has had a decrease in appetite. He sees Dr Yvone Neu of Endocrinology. He is taking Levmir, Metformin, Mounjaro and Jardiance.   2. Vitamin D deficiency He is currently taking prescription vitamin D 50,000 IU each week. He denies nausea, vomiting or muscle weakness.  Assessment/Plan:  No orders of the defined types were placed in this encounter.   Medications Discontinued During This Encounter  Medication Reason   Vitamin D, Ergocalciferol, (DRISDOL) 1.25 MG (50000 UNIT) CAPS capsule Reorder     Meds ordered this encounter  Medications   Vitamin D, Ergocalciferol, (DRISDOL) 1.25 MG (50000 UNIT) CAPS capsule    Sig: Take 1 capsule (50,000 Units total) by mouth every 7 (seven) days.    Dispense:  4 capsule    Refill:  0    30 d supply;  ** OV for RF **   Do not send RF request     1. Type 2 diabetes mellitus with other  circulatory complication, without long-term current use of insulin (HCC) Good blood sugar control is important to decrease the likelihood of diabetic complications such as nephropathy, neuropathy, limb loss, blindness, coronary artery disease, and death. Intensive lifestyle modification including diet, exercise and weight loss are the first line of treatment for diabetes.  Continue Mounjaro 12.5 mg.  2. Vitamin D deficiency Low Vitamin D level contributes to fatigue and are associated with obesity, breast, and colon cancer. He agrees to continue to take prescription Vitamin D '@50'$ ,000 IU every week and will follow-up for routine testing of Vitamin D, at least 2-3 times per year to avoid over-replacement.  Refill- Vitamin D, Ergocalciferol, (DRISDOL) 1.25 MG (50000 UNIT) CAPS capsule; Take 1 capsule (50,000 Units total) by mouth every 7 (seven) days.  Dispense: 4 capsule; Refill: 0  3. Obesity, Current BMI 40.6 We went over fasting lipid panel that was done recently at his PCP's office.  Triglycerides have increased and HDL is a little worse.  Discussed with Woodie implications of this and how more carbs, off plan eating has caused it.  A1c increased to 7.0, two months ago it was 6.4.  Brandon Rich is currently in the action stage of change. As such, his goal is to continue with weight loss efforts. He has agreed to the Category 3 Plan.   Exercise goals:  As is.   Behavioral modification strategies: increasing lean protein  intake, decreasing simple carbohydrates, and better snacking choices.  Snacking calories should stay within 300.  Brandon Rich has agreed to follow-up with our clinic in 4 weeks. He was informed of the importance of frequent follow-up visits to maximize his success with intensive lifestyle modifications for his multiple health conditions.   Objective:   Blood pressure 115/74, pulse 69, temperature 98.5 F (36.9 C), height '5\' 8"'$  (1.727 m), weight 266 lb (120.7 kg), SpO2 96 %. Body mass  index is 40.45 kg/m.  General: Cooperative, alert, well developed, in no acute distress. HEENT: Conjunctivae and lids unremarkable. Cardiovascular: Regular rhythm.  Lungs: Normal work of breathing. Neurologic: No focal deficits.   Lab Results  Component Value Date   CREATININE 0.90 11/12/2021   BUN 23 11/12/2021   NA 137 11/12/2021   K 4.2 11/12/2021   CL 100 11/12/2021   CO2 22 11/12/2021   Lab Results  Component Value Date   ALT 25 11/12/2021   AST 50 (H) 11/12/2021   ALKPHOS 60 11/12/2021   BILITOT 0.4 11/12/2021   Lab Results  Component Value Date   HGBA1C 7.1 (H) 11/12/2021   HGBA1C 6.3 (H) 06/17/2021   HGBA1C 6.5 (H) 03/12/2021   HGBA1C 7.7 (H) 12/04/2020   HGBA1C 7.4 (H) 06/27/2020   Lab Results  Component Value Date   INSULIN 5.2 12/04/2020   Lab Results  Component Value Date   TSH 1.550 12/04/2020   Lab Results  Component Value Date   CHOL 128 01/15/2022   HDL 31.40 (L) 01/15/2022   LDLCALC 62 01/15/2022   TRIG 175.0 (H) 01/15/2022   CHOLHDL 4 01/15/2022   Lab Results  Component Value Date   VD25OH 54.7 11/12/2021   VD25OH 49.8 06/17/2021   VD25OH 62.7 03/12/2021   Lab Results  Component Value Date   WBC 9.0 07/03/2021   HGB 14.9 07/03/2021   HCT 45.5 07/03/2021   MCV 82.5 07/03/2021   PLT 251.0 07/03/2021   No results found for: "IRON", "TIBC", "FERRITIN"   Attestation Statements:   Reviewed by clinician on day of visit: allergies, medications, problem list, medical history, surgical history, family history, social history, and previous encounter notes.  Time spent on visit including pre-visit chart review and post-visit care and charting was 30 minutes.   I, Davy Pique, RMA, am acting as Location manager for [employer], DO.   I have reviewed the above documentation for accuracy and completeness, and I agree with the above. Marjory Sneddon, D.O.  The [place] was signed into law in 2016 which includes the  topic of electronic health records.  This provides immediate access to information in MyChart.  This includes consultation notes, operative notes, office notes, lab results and pathology reports.  If you have any questions about what you read please let us know at your next visit so we can discuss your concerns and take corrective action if need be.  We are right here with you.

## 2022-02-10 ENCOUNTER — Other Ambulatory Visit (HOSPITAL_BASED_OUTPATIENT_CLINIC_OR_DEPARTMENT_OTHER): Payer: Self-pay

## 2022-02-17 ENCOUNTER — Encounter (INDEPENDENT_AMBULATORY_CARE_PROVIDER_SITE_OTHER): Payer: Self-pay | Admitting: Family Medicine

## 2022-02-17 ENCOUNTER — Ambulatory Visit (INDEPENDENT_AMBULATORY_CARE_PROVIDER_SITE_OTHER): Payer: Managed Care, Other (non HMO) | Admitting: Family Medicine

## 2022-02-17 VITALS — BP 113/71 | HR 71 | Temp 97.6°F | Ht 68.0 in | Wt 269.0 lb

## 2022-02-17 DIAGNOSIS — E559 Vitamin D deficiency, unspecified: Secondary | ICD-10-CM

## 2022-02-17 DIAGNOSIS — Z6841 Body Mass Index (BMI) 40.0 and over, adult: Secondary | ICD-10-CM

## 2022-02-17 DIAGNOSIS — F509 Eating disorder, unspecified: Secondary | ICD-10-CM | POA: Diagnosis not present

## 2022-02-17 DIAGNOSIS — Z7984 Long term (current) use of oral hypoglycemic drugs: Secondary | ICD-10-CM

## 2022-02-17 DIAGNOSIS — Z794 Long term (current) use of insulin: Secondary | ICD-10-CM

## 2022-02-17 DIAGNOSIS — E669 Obesity, unspecified: Secondary | ICD-10-CM

## 2022-02-17 DIAGNOSIS — E1169 Type 2 diabetes mellitus with other specified complication: Secondary | ICD-10-CM

## 2022-02-17 DIAGNOSIS — Z9189 Other specified personal risk factors, not elsewhere classified: Secondary | ICD-10-CM

## 2022-02-17 DIAGNOSIS — Z7985 Long-term (current) use of injectable non-insulin antidiabetic drugs: Secondary | ICD-10-CM

## 2022-02-17 MED ORDER — VITAMIN D (ERGOCALCIFEROL) 1.25 MG (50000 UNIT) PO CAPS
50000.0000 [IU] | ORAL_CAPSULE | ORAL | 0 refills | Status: DC
Start: 2022-02-17 — End: 2022-03-15

## 2022-02-18 LAB — HEMOGLOBIN A1C
Est. average glucose Bld gHb Est-mCnc: 171 mg/dL
Hgb A1c MFr Bld: 7.6 % — ABNORMAL HIGH (ref 4.8–5.6)

## 2022-02-21 NOTE — Progress Notes (Signed)
Chief Complaint:   OBESITY Brandon Rich is here to discuss his progress with his obesity treatment plan along with follow-up of his obesity related diagnoses. Brandon Rich is on the Category 3 Plan and states he is following his eating plan approximately 70% of the time. Brandon Rich states he is using stationary bike 30 minutes 1-2 times per week.  Today's visit was #: 22 Starting weight: 302 lbs Starting date: 12/04/2020 Today's weight: 269 lbs Today's date: 02/17/2022 Total lbs lost to date: 33 Total lbs lost since last in-office visit: +3  Interim History: The last couple of weeks have been a struggle due to traveling more for work and off-plan eating. His issue is portion control with snacking and loves starchy vegetables. He is not exercising.   Subjective:   1. Eating disorder, unspecified type Brandon Rich is snacking when he is not hungry. He feels he does it out of habit and emotional reasons. He also feels he needs to drink more water. Pt also feels he needs to drink to more water.  2. Vitamin D deficiency He is currently taking prescription vitamin D 50,000 IU each week. He denies nausea, vomiting or muscle weakness.  3. Type 2 diabetes mellitus with other specified complication, with long-term current use of insulin (HCC) Medications reviewed. Diabetic ROS: no polyuria or polydipsia, no chest pain, dyspnea or TIA's, no numbness, tingling or pain in extremities.  4. At risk for hyperglycemia Brandon Rich is at risk hyperglycemia due to snacking on high carb items.  Assessment/Plan:   Orders Placed This Encounter  Procedures   Hemoglobin A1c    Medications Discontinued During This Encounter  Medication Reason   Vitamin D, Ergocalciferol, (DRISDOL) 1.25 MG (50000 UNIT) CAPS capsule Reorder     Meds ordered this encounter  Medications   Vitamin D, Ergocalciferol, (DRISDOL) 1.25 MG (50000 UNIT) CAPS capsule    Sig: Take 1 capsule (50,000 Units total) by mouth every 7 (seven) days.     Dispense:  4 capsule    Refill:  0    30 d supply;  ** OV for RF **   Do not send RF request     1. Eating disorder, unspecified type Behavior modification techniques were discussed today to help Brandon Rich deal with his emotional/non-hunger eating behaviors.  Orders and follow up as documented in patient record. Pt declines referral to Dr. Mallie Mussel.  Handouts: Emotional Eating; Mindful Eating  2. Vitamin D deficiency Low Vitamin D level contributes to fatigue and are associated with obesity, breast, and colon cancer. He agrees to continue to take prescription Vitamin D '@50'$ ,000 IU every week and will follow-up for routine testing of Vitamin D, at least 2-3 times per year to avoid over-replacement.  Refill- Vitamin D, Ergocalciferol, (DRISDOL) 1.25 MG (50000 UNIT) CAPS capsule; Take 1 capsule (50,000 Units total) by mouth every 7 (seven) days.  Dispense: 4 capsule; Refill: 0  3. Type 2 diabetes mellitus with other specified complication, with long-term current use of insulin (HCC) Good blood sugar control is important to decrease the likelihood of diabetic complications such as nephropathy, neuropathy, limb loss, blindness, coronary artery disease, and death. Intensive lifestyle modification including diet, exercise and weight loss are the first line of treatment for diabetes. Continue Mounjaro, Metformin, and Jardiance. Follow prudent nutritional plan and continue weight loss, and exercise.   - Hemoglobin A1c  4. At risk for hyperglycemia Brandon Rich was given approximately 9 minutes of counseling today regarding detriments to health with hyperglycemia and regarding the prevention of  hyperglycemia.  He was advised of various red flag symptoms associated with hyperglycemia and the fact that often, hyperglycemia can be asymptomatic.  Brandon Rich was instructed to avoid skipping foods/ meals, to have proteins with each meal and to avoid eating only carb-rich foods.  The patient should be checking FBS's, 2 hr PP  BS's and also check blood sugars any time they do not feel well- especially with new onset nausea/ vomiting and abdominal pain, excessive thirst or hunger, tachycardia etc.  Importance of regular follow-ups with their PCP or Endocrinologist, in addition to our visits, was stressed to patient.  5. Obesity, Current BMI 40.9 Brandon Rich is currently in the action stage of change. As such, his goal is to continue with weight loss efforts. He has agreed to the Category 3 Plan.   Pt plans to get serious about exercise, doing 30 minutes 4 days a week and 4 days of resistance bands. Pt also needs to measure and stick to snacks calories (only 300 calories).  Exercise goals:  As is  Behavioral modification strategies: better snacking choices, emotional eating strategies, and avoiding temptations.  Brandon Rich has agreed to follow-up with our clinic in 3-4 weeks. He was informed of the importance of frequent follow-up visits to maximize his success with intensive lifestyle modifications for his multiple health conditions.   Brandon Rich was informed we would discuss his lab results at his next visit unless there is a critical issue that needs to be addressed sooner. Brandon Rich agreed to keep his next visit at the agreed upon time to discuss these results.  Objective:   Blood pressure 113/71, pulse 71, temperature 97.6 F (36.4 C), height '5\' 8"'$  (1.727 m), weight 269 lb (122 kg), SpO2 100 %. Body mass index is 40.9 kg/m.  General: Cooperative, alert, well developed, in no acute distress. HEENT: Conjunctivae and lids unremarkable. Cardiovascular: Regular rhythm.  Lungs: Normal work of breathing. Neurologic: No focal deficits.   Lab Results  Component Value Date   CREATININE 0.90 11/12/2021   BUN 23 11/12/2021   NA 137 11/12/2021   K 4.2 11/12/2021   CL 100 11/12/2021   CO2 22 11/12/2021   Lab Results  Component Value Date   ALT 25 11/12/2021   AST 50 (H) 11/12/2021   ALKPHOS 60 11/12/2021   BILITOT 0.4  11/12/2021   Lab Results  Component Value Date   HGBA1C 7.6 (H) 02/17/2022   HGBA1C 7.1 (H) 11/12/2021   HGBA1C 6.3 (H) 06/17/2021   HGBA1C 6.5 (H) 03/12/2021   HGBA1C 7.7 (H) 12/04/2020   Lab Results  Component Value Date   INSULIN 5.2 12/04/2020   Lab Results  Component Value Date   TSH 1.550 12/04/2020   Lab Results  Component Value Date   CHOL 128 01/15/2022   HDL 31.40 (L) 01/15/2022   LDLCALC 62 01/15/2022   TRIG 175.0 (H) 01/15/2022   CHOLHDL 4 01/15/2022   Lab Results  Component Value Date   VD25OH 54.7 11/12/2021   VD25OH 49.8 06/17/2021   VD25OH 62.7 03/12/2021   Lab Results  Component Value Date   WBC 9.0 07/03/2021   HGB 14.9 07/03/2021   HCT 45.5 07/03/2021   MCV 82.5 07/03/2021   PLT 251.0 07/03/2021   Attestation Statements:   Reviewed by clinician on day of visit: allergies, medications, problem list, medical history, surgical history, family history, social history, and previous encounter notes.  I, Kathlene November, BS, CMA, am acting as transcriptionist for [employer], DO.   I have reviewed  the above documentation for accuracy and completeness, and I agree with the above. Marjory Sneddon, D.O.  The [place] was signed into law in 2016 which includes the topic of electronic health records.  This provides immediate access to information in MyChart.  This includes consultation notes, operative notes, office notes, lab results and pathology reports.  If you have any questions about what you read please let us know at your next visit so we can discuss your concerns and take corrective action if need be.  We are right here with you.

## 2022-02-24 ENCOUNTER — Encounter (INDEPENDENT_AMBULATORY_CARE_PROVIDER_SITE_OTHER): Payer: Self-pay

## 2022-03-01 ENCOUNTER — Other Ambulatory Visit (HOSPITAL_BASED_OUTPATIENT_CLINIC_OR_DEPARTMENT_OTHER): Payer: Self-pay

## 2022-03-15 ENCOUNTER — Ambulatory Visit (INDEPENDENT_AMBULATORY_CARE_PROVIDER_SITE_OTHER): Payer: Managed Care, Other (non HMO) | Admitting: Family Medicine

## 2022-03-15 ENCOUNTER — Other Ambulatory Visit (HOSPITAL_BASED_OUTPATIENT_CLINIC_OR_DEPARTMENT_OTHER): Payer: Self-pay

## 2022-03-15 ENCOUNTER — Encounter (INDEPENDENT_AMBULATORY_CARE_PROVIDER_SITE_OTHER): Payer: Self-pay | Admitting: Family Medicine

## 2022-03-15 VITALS — BP 127/78 | HR 69 | Temp 97.9°F | Ht 68.0 in | Wt 274.0 lb

## 2022-03-15 DIAGNOSIS — E1159 Type 2 diabetes mellitus with other circulatory complications: Secondary | ICD-10-CM

## 2022-03-15 DIAGNOSIS — E559 Vitamin D deficiency, unspecified: Secondary | ICD-10-CM | POA: Diagnosis not present

## 2022-03-15 DIAGNOSIS — Z7984 Long term (current) use of oral hypoglycemic drugs: Secondary | ICD-10-CM

## 2022-03-15 DIAGNOSIS — E669 Obesity, unspecified: Secondary | ICD-10-CM | POA: Diagnosis not present

## 2022-03-15 DIAGNOSIS — Z6841 Body Mass Index (BMI) 40.0 and over, adult: Secondary | ICD-10-CM | POA: Diagnosis not present

## 2022-03-15 DIAGNOSIS — Z9189 Other specified personal risk factors, not elsewhere classified: Secondary | ICD-10-CM | POA: Insufficient documentation

## 2022-03-15 DIAGNOSIS — Z7985 Long-term (current) use of injectable non-insulin antidiabetic drugs: Secondary | ICD-10-CM

## 2022-03-15 MED ORDER — VITAMIN D (ERGOCALCIFEROL) 1.25 MG (50000 UNIT) PO CAPS: 50000.0000 [IU] | ORAL_CAPSULE | ORAL | 0 refills | Status: DC

## 2022-03-15 MED ORDER — TIRZEPATIDE 12.5 MG/0.5ML ~~LOC~~ SOAJ
12.5000 mg | SUBCUTANEOUS | 0 refills | Status: DC
  Filled 2022-03-15 – 2022-03-29 (×2): qty 2, 28d supply, fill #0

## 2022-03-22 NOTE — Progress Notes (Signed)
Chief Complaint:   OBESITY Brandon Rich is here to discuss his progress with his obesity treatment plan along with follow-up of his obesity related diagnoses. Brandon Rich is on the Category 3 Plan and states he is following his eating plan approximately 85% of the time. Brandon Rich states he is not currently exercising.  Today's visit was #: 23 Starting weight: 302 lbs Starting date: 12/04/2020 Today's weight: 274 lbs Today's date: 03/15/2022 Total lbs lost to date: 28 Total lbs lost since last in-office visit: +5  Interim History: Brandon Rich is not happy with his weight gain of 5 lbs. He endorses he did better with measuring his snack calories but didn't exercise much, which was another of his goals last OV. Pt gained 6.4 lbs in fat mass and lost 1 lb in muscle mass since his last OV.   Subjective:   1. Type 2 diabetes mellitus with other circulatory complication, without long-term current use of insulin (HCC) Diabetes Mellitus: Controlled. Medication: Jardiance, Metformin, Mounjaro. Issues reviewed: blood sugar goals, complications of diabetes mellitus, hypoglycemia prevention and treatment, exercise, and nutrition.   2. Vitamin D deficiency He is currently taking prescription vitamin D 50,000 IU each week. He denies nausea, vomiting or muscle weakness.  3. At risk for hyperglycemia Brandon Rich is at risk for hyperglycemia due to excess calories.  Assessment/Plan:  No orders of the defined types were placed in this encounter.   Medications Discontinued During This Encounter  Medication Reason   tirzepatide (MOUNJARO) 12.5 MG/0.5ML Pen Reorder   Vitamin D, Ergocalciferol, (DRISDOL) 1.25 MG (50000 UNIT) CAPS capsule Reorder     Meds ordered this encounter  Medications   Vitamin D, Ergocalciferol, (DRISDOL) 1.25 MG (50000 UNIT) CAPS capsule    Sig: Take 1 capsule (50,000 Units total) by mouth every 7 (seven) days.    Dispense:  4 capsule    Refill:  0    30 d supply;  ** OV for RF **   Do  not send RF request   tirzepatide (MOUNJARO) 12.5 MG/0.5ML Pen    Sig: Inject 12.5 mg into the skin once a week.    Dispense:  2 mL    Refill:  0     1. Type 2 diabetes mellitus with other circulatory complication, without long-term current use of insulin ([place]) Plan: Well controlled.  No signs of complications, medication side effects, or red flags.  Continue current regimen of Jardiance, Metformin, and Mounjaro.Marland Kitchen    Refill- tirzepatide (MOUNJARO) 12.5 MG/0.5ML Pen; Inject 12.5 mg into the skin once a week.  Dispense: 2 mL; Refill: 0  2. Vitamin D deficiency Low Vitamin D level contributes to fatigue and are associated with obesity, breast, and colon cancer. He agrees to continue to take prescription Vitamin D '@50'$ ,000 IU every week and will follow-up for routine testing of Vitamin D, at least 2-3 times per year to avoid over-replacement.  Refill- Vitamin D, Ergocalciferol, (DRISDOL) 1.25 MG (50000 UNIT) CAPS capsule; Take 1 capsule (50,000 Units total) by mouth every 7 (seven) days.  Dispense: 4 capsule; Refill: 0  3. At risk for hyperglycemia Brandon Rich was given approximately 10 minutes of counseling today regarding detriments to health with hyperglycemia and regarding the prevention of hyperglycemia.  He was advised of various red flag symptoms associated with hyperglycemia and the fact that often, hyperglycemia can be asymptomatic.  Brandon Rich was instructed to avoid skipping foods/ meals, to have proteins with each meal and to avoid eating only carb-rich foods.  The patient should be  checking FBS's, 2 hr PP BS's and also check blood sugars any time they do not feel well- especially with new onset nausea/ vomiting and abdominal pain, excessive thirst or hunger, tachycardia etc.  Importance of regular follow-ups with their PCP or Endocrinologist, in addition to our visits, was stressed to patient.  4. Obesity, Current BMI 41.8 Brandon Rich is currently in the action stage of change. As such, his goal is  to continue with weight loss efforts. He has agreed to the Category 3 Plan.   Ways to prevent hidden calories discussed with pt (don't use EVOO or coconut oil).  Exercise goals: All adults should avoid inactivity. Some physical activity is better than none, and adults who participate in any amount of physical activity gain some health benefits.  Behavioral modification strategies: increasing lean protein intake, keeping healthy foods in the home, better snacking choices, and planning for success.  Brandon Rich has agreed to follow-up with our clinic in 3-4 weeks. He was informed of the importance of frequent follow-up visits to maximize his success with intensive lifestyle modifications for his multiple health conditions.   Objective:   Blood pressure 127/78, pulse 69, temperature 97.9 F (36.6 C), height '5\' 8"'$  (1.727 m), weight 274 lb (124.3 kg), SpO2 97 %. Body mass index is 41.66 kg/m.  General: Cooperative, alert, well developed, in no acute distress. HEENT: Conjunctivae and lids unremarkable. Cardiovascular: Regular rhythm.  Lungs: Normal work of breathing. Neurologic: No focal deficits.   Lab Results  Component Value Date   CREATININE 0.90 11/12/2021   BUN 23 11/12/2021   NA 137 11/12/2021   K 4.2 11/12/2021   CL 100 11/12/2021   CO2 22 11/12/2021   Lab Results  Component Value Date   ALT 25 11/12/2021   AST 50 (H) 11/12/2021   ALKPHOS 60 11/12/2021   BILITOT 0.4 11/12/2021   Lab Results  Component Value Date   HGBA1C 7.6 (H) 02/17/2022   HGBA1C 7.1 (H) 11/12/2021   HGBA1C 6.3 (H) 06/17/2021   HGBA1C 6.5 (H) 03/12/2021   HGBA1C 7.7 (H) 12/04/2020   Lab Results  Component Value Date   INSULIN 5.2 12/04/2020   Lab Results  Component Value Date   TSH 1.550 12/04/2020   Lab Results  Component Value Date   CHOL 128 01/15/2022   HDL 31.40 (L) 01/15/2022   LDLCALC 62 01/15/2022   TRIG 175.0 (H) 01/15/2022   CHOLHDL 4 01/15/2022   Lab Results  Component Value  Date   VD25OH 54.7 11/12/2021   VD25OH 49.8 06/17/2021   VD25OH 62.7 03/12/2021   Lab Results  Component Value Date   WBC 9.0 07/03/2021   HGB 14.9 07/03/2021   HCT 45.5 07/03/2021   MCV 82.5 07/03/2021   PLT 251.0 07/03/2021   Attestation Statements:   Reviewed by clinician on day of visit: allergies, medications, problem list, medical history, surgical history, family history, social history, and previous encounter notes.  I, Kathlene November, BS, CMA, am acting as transcriptionist for [employer], DO.   I have reviewed the above documentation for accuracy and completeness, and I agree with the above. Marjory Sneddon, D.O.  The [place] was signed into law in 2016 which includes the topic of electronic health records.  This provides immediate access to information in MyChart.  This includes consultation notes, operative notes, office notes, lab results and pathology reports.  If you have any questions about what you read please let us know at your next visit so  we can discuss your concerns and take corrective action if need be.  We are right here with you.

## 2022-03-29 ENCOUNTER — Other Ambulatory Visit (HOSPITAL_BASED_OUTPATIENT_CLINIC_OR_DEPARTMENT_OTHER): Payer: Self-pay

## 2022-04-09 ENCOUNTER — Ambulatory Visit: Payer: Managed Care, Other (non HMO) | Admitting: Family Medicine

## 2022-04-09 ENCOUNTER — Encounter: Payer: Self-pay | Admitting: Family Medicine

## 2022-04-09 VITALS — BP 124/68 | HR 76 | Temp 98.1°F | Resp 16 | Ht 68.0 in | Wt 278.2 lb

## 2022-04-09 DIAGNOSIS — F5104 Psychophysiologic insomnia: Secondary | ICD-10-CM

## 2022-04-09 DIAGNOSIS — I10 Essential (primary) hypertension: Secondary | ICD-10-CM | POA: Diagnosis not present

## 2022-04-09 DIAGNOSIS — Z23 Encounter for immunization: Secondary | ICD-10-CM | POA: Diagnosis not present

## 2022-04-09 DIAGNOSIS — F4323 Adjustment disorder with mixed anxiety and depressed mood: Secondary | ICD-10-CM

## 2022-04-09 DIAGNOSIS — E119 Type 2 diabetes mellitus without complications: Secondary | ICD-10-CM | POA: Diagnosis not present

## 2022-04-09 DIAGNOSIS — Z1211 Encounter for screening for malignant neoplasm of colon: Secondary | ICD-10-CM

## 2022-04-09 DIAGNOSIS — E785 Hyperlipidemia, unspecified: Secondary | ICD-10-CM | POA: Diagnosis not present

## 2022-04-09 DIAGNOSIS — F411 Generalized anxiety disorder: Secondary | ICD-10-CM

## 2022-04-09 DIAGNOSIS — Z794 Long term (current) use of insulin: Secondary | ICD-10-CM

## 2022-04-09 MED ORDER — METFORMIN HCL 1000 MG PO TABS: ORAL_TABLET | ORAL | 1 refills | Status: DC

## 2022-04-09 MED ORDER — HYDROXYZINE HCL 25 MG PO TABS: 12.5000 mg | ORAL_TABLET | Freq: Every evening | ORAL | 1 refills | Status: DC | PRN

## 2022-04-09 MED ORDER — SERTRALINE HCL 100 MG PO TABS: 150.0000 mg | ORAL_TABLET | Freq: Every day | ORAL | 1 refills | Status: DC

## 2022-04-09 MED ORDER — ATORVASTATIN CALCIUM 20 MG PO TABS: 20.0000 mg | ORAL_TABLET | Freq: Every day | ORAL | 1 refills | Status: DC

## 2022-04-09 NOTE — Patient Instructions (Addendum)
No med changes at this time, glad to hear new doses and meds working well.

## 2022-04-09 NOTE — Progress Notes (Signed)
Subjective:  Patient ID: Brandon Rich, male    DOB: December 27, 1960  Age: 61 y.o. MRN: [id number]  CC:  Chief Complaint  Patient presents with   Diabetes   Depression   Hyperlipidemia    Pt had coffee this am otherwise NPO   Insomnia    HPI Brandon Rich presents for   Diabetes: Complicated by obesity, diabetic neuropathy.  Insulin-dependent.  Followed by endocrinology, Dr. Buddy Duty, and weight management, Dr. Raliegh Scarlet.  Last visit August 28. Treated with Mounjaro, metformin, Jardiance.  Continued same regimen. On vitamin D for vitamin D deficiency. On ACE inhibitor and statin Started on gabapentin at bedtime for possible diabetic neuropathy at June visit. Has been helpful at '200mg'$ , no side effects.  Microalbumin: Due? Thinks had at endocrine.  Optho, foot exam, pneumovax: Up-to-date Due for repeat Cologuard, negative Dec 14, 2018, colonoscopy in 2005 - diverticulosis. No polyps. Plan on colonoscopy in January.  On sildenafil for ED, asking about low T.    Lab Results  Component Value Date   HGBA1C 7.6 (H) 02/17/2022   HGBA1C 7.1 (H) 11/12/2021   HGBA1C 6.3 (H) 06/17/2021   Lab Results  Component Value Date   MICROALBUR 1.3 06/03/2016   LDLCALC 62 01/15/2022   CREATININE 0.90 11/12/2021   Hypertension: With OSA on CPAP.  Followed by sleep specialist.  Cardiologist Dr. Percival Spanish, lisinopril 40 mg daily. Home readings:125-130/70-80.  BP Readings from Last 3 Encounters:  04/09/22 124/68  03/15/22 127/78  02/17/22 113/71   Lab Results  Component Value Date   CREATININE 0.90 11/12/2021   Hyperlipidemia: Stable LDL in June, continued on Lipitor 20 mg daily Lab Results  Component Value Date   CHOL 128 01/15/2022   HDL 31.40 (L) 01/15/2022   LDLCALC 62 01/15/2022   TRIG 175.0 (H) 01/15/2022   CHOLHDL 4 01/15/2022   Lab Results  Component Value Date   ALT 25 11/12/2021   AST 50 (H) 11/12/2021   ALKPHOS 60 11/12/2021   BILITOT 0.4 11/12/2021   Anxiety with  insomnia Followed by psychologist, sertraline 100 mg daily increased to 150 mg at his June visit for improved control.  Continued on hydroxyzine as needed for sleep. Feeling well. Improved mood, sleeping ok most of the time with hydrozyzine and gabapentin - no daytime sedation.     History Patient Active Problem List   Diagnosis Date Noted   At risk for hyperglycemia 03/15/2022   Polyphagia 08/26/2021   Class 3 severe obesity with serious comorbidity and body mass index (BMI) of 45.0 to 49.9 in adult ([place]) 28/31/5176   Nonalcoholic hepatosteatosis 16/01/3709   Other fatigue 12/04/2020   SOBOE (shortness of breath on exertion) 12/04/2020   Vitamin D deficiency 12/04/2020   Depression 12/04/2020   Chest discomfort 02/02/2019   Morbid obesity ([place]) 02/02/2019   Pain in right hand 02/15/2018   Trigger finger 02/15/2018   Cortical age-related cataract of both eyes 03/30/2017   Nuclear sclerotic cataract of both eyes 03/30/2017   Primary open angle glaucoma (POAG) of both eyes, indeterminate stage 03/30/2017   Type 2 diabetes mellitus without complication, without long-term current use of insulin ([place]) 03/30/2017   LVH (left ventricular hypertrophy) 03/29/2016   FUO (fever of unknown origin) 02/16/2016   Acute combined systolic and diastolic heart failure ([place]) 02/16/2016   Weight gain 02/16/2016   Weight loss 02/16/2016   Rash and nonspecific skin eruption 02/16/2016   DOE (dyspnea on exertion) 02/16/2016   T wave inversion in EKG 02/16/2016  Transaminitis 02/16/2016   Arthritis of big toe 08/23/2013   Pain of left great toe 08/23/2013   Hypertension associated with type 2 diabetes mellitus ([place]) 12/20/2011   Diabetes mellitus ([place]) 12/20/2011   Hyperlipidemia associated with type 2 diabetes mellitus ([place]) 12/20/2011   Glaucoma (increased eye pressure) 07/19/1996   Past Medical History:  Diagnosis Date   Acute combined systolic and diastolic heart failure ([place]) 02/16/2016    Allergy    Anxiety    Phreesia 12/27/2019   Anxiety    DDD (degenerative disc disease), cervical    Depression    Diabetes mellitus    Diabetes mellitus without complication ([place])    Phreesia 12/27/2019   Diabetic foot ulcer (HCC)    FUO (fever of unknown origin) 02/16/2016   Glaucoma    Heartburn    Hyperlipidemia    Hypertension    LVH (left ventricular hypertrophy) 03/29/2016   Palpitation    Rash and nonspecific skin eruption 02/16/2016   Sleep apnea    CPAP   SOBOE (shortness of breath on exertion)    Transaminitis 02/16/2016   Past Surgical History:  Procedure Laterality Date   AMPUTATION TOE  04/2020   AMPUTATION TOE  01/2020   HERNIA REPAIR     SPINE SURGERY     L4/L5   TUMOR REMOVAL  12/1998   Bone Cyst   Allergies  Allergen Reactions   Other Other (See Comments)    Hayfever   Prior to Admission medications   Medication Sig Start Date End Date Taking? Authorizing Provider  Ascorbic Acid (VITAMIN C) 1000 MG tablet Take 1,000 mg by mouth daily.   Yes [provider]  aspirin 81 MG tablet Take 81 mg by mouth daily.   Yes [provider]  atorvastatin (LIPITOR) 20 MG tablet Take 1 tablet (20 mg total) by mouth daily. 01/01/22  Yes [name], MD  BD PEN NEEDLE NANO U/F 32G X 4 MM MISC  07/04/18  Yes [provider]  calcium carbonate (TUMS - DOSED IN MG ELEMENTAL CALCIUM) 500 MG chewable tablet Chew 2 tablets by mouth daily as needed for indigestion or heartburn.   Yes [provider]  dorzolamide-timolol (COSOPT) 22.3-6.8 MG/ML ophthalmic solution 1 drop 2 (two) times daily.   Yes [provider]  empagliflozin (JARDIANCE) 25 MG TABS tablet Take 25 mg by mouth daily. 12/16/16  Yes [name], MD  fexofenadine (ALLEGRA) 180 MG tablet Take 180 mg by mouth daily.   Yes [provider]  fluticasone (FLONASE) 50 MCG/ACT nasal spray PLACE 2 SPRAYS INTO BOTH NOSTRILS DAILY. 07/09/14  Yes Guest, Benn Moulder, MD   gabapentin (NEURONTIN) 100 MG capsule Take 1-2 capsules (100-200 mg total) by mouth at bedtime. 01/01/22  Yes [name], MD  hydrOXYzine (ATARAX) 25 MG tablet Take 0.5-1 tablets (12.5-25 mg total) by mouth at bedtime as needed for anxiety. 01/01/22  Yes [name], MD  Insulin Detemir (LEVEMIR FLEXPEN [place]) Inject 40 Units into the skin daily.   Yes [provider]  lisinopril (ZESTRIL) 40 MG tablet Take 1 tablet (40 mg total) by mouth daily. 07/03/21  Yes [name], MD  metFORMIN (GLUCOPHAGE) 1000 MG tablet TAKE ONE TABLET BY MOUTH TWICE A DAY 01/01/22  Yes [name], MD  Multiple Vitamin (MULTIVITAMIN) tablet Take 1 tablet by mouth every evening.    Yes [provider]  sertraline (ZOLOFT) 100 MG tablet Take 1.5 tablets (150 mg total) by mouth  daily. 01/01/22  Yes [name], MD  sildenafil (VIAGRA) 100 MG tablet Take 0.5-1 tablets (50-100 mg total) by mouth daily as needed for erectile dysfunction. 01/01/22  Yes [name], MD  tirzepatide [facility]) 12.5 MG/0.5ML Pen Inject 12.5 mg into the skin once a week. 03/15/22  Yes Opalski, Deborah, DO  Travoprost, BAK Free, (TRAVATAN) 0.004 % SOLN ophthalmic solution SMARTSIG:In Eye(s) 01/28/21  Yes [provider]  Vitamin D, Ergocalciferol, (DRISDOL) 1.25 MG (50000 UNIT) CAPS capsule Take 1 capsule (50,000 Units total) by mouth every 7 (seven) days. 03/15/22  Yes Mellody Dance, DO   Social History   Socioeconomic History   Marital status: Married    Spouse name: Channon Ambrosini   Number of children: 2   Years of education: 16   Highest education level: Not on file  Occupational History   Occupation: Tour manager  Tobacco Use   Smoking status: Never   Smokeless tobacco: Never  Vaping Use   Vaping Use: Never used  Substance and Sexual Activity   Alcohol use: No    Alcohol/week: 0.0 standard drinks of alcohol    Comment: per pt 1 drink once in a long while   Drug use:  No   Sexual activity: Yes    Partners: Female  Other Topics Concern   Not on file  Social History Narrative   Married. Education: [employer].    Social Determinants of Health   Financial Resource Strain: Not on file  Food Insecurity: Not on file  Transportation Needs: Not on file  Physical Activity: Not on file  Stress: Not on file  Social Connections: Not on file  Intimate Partner Violence: Not on file    Review of Systems Per HPI.   Objective:   Vitals:   04/09/22 0826  BP: 124/68  Pulse: 76  Resp: 16  Temp: 98.1 F (36.7 C)  TempSrc: Temporal  SpO2: 96%  Weight: 278 lb 3.2 oz (126.2 kg)  Height: '5\' 8"'$  (1.727 m)     Physical Exam Vitals reviewed.  Constitutional:      Appearance: He is well-developed.  HENT:     Head: Normocephalic and atraumatic.  Neck:     Vascular: No carotid bruit or JVD.  Cardiovascular:     Rate and Rhythm: Normal rate and regular rhythm.     Heart sounds: Normal heart sounds. No murmur heard. Pulmonary:     Effort: Pulmonary effort is normal.     Breath sounds: Normal breath sounds. No rales.  Musculoskeletal:     Right lower leg: No edema.     Left lower leg: No edema.  Skin:    General: Skin is warm and dry.  Neurological:     Mental Status: He is alert and oriented to person, place, and time.  Psychiatric:        Mood and Affect: Mood normal.        Assessment & Plan:  Brandon Rich is a 61 y.o. male . Type 2 diabetes mellitus without complication, with long-term current use of insulin ([place]) - Plan: metFORMIN (GLUCOPHAGE) 1000 MG tablet  -  Stable, tolerating current regimen.  Continue follow-up with endocrinology, weight management.  Neuropathy improved with gabapentin, continue same, option of higher doses if needed.  Hyperlipidemia, unspecified hyperlipidemia type - Plan: atorvastatin (LIPITOR) 20 MG tablet  -Stable on recent labs, continue Lipitor.  Essential hypertension - Plan: atorvastatin (LIPITOR) 20 MG  tablet, metFORMIN (GLUCOPHAGE) 1000 MG tablet  -Stable, continue same  regimen.  Anxiety state - Plan: sertraline (ZOLOFT) 100 MG tablet  -Improved at higher dose sertraline, continue 150 mg daily  Psychophysiological insomnia - Plan: hydrOXYzine (ATARAX) 25 MG tablet  -Stable, tolerating hydroxyzine and gabapentin combo.  No changes.  Continue CPAP.  Adjustment disorder with mixed anxiety and depressed mood - Plan: hydrOXYzine (ATARAX) 25 MG tablet  -As above improved with higher dose Zoloft.  Screen for colon cancer - Plan: Ambulatory referral to Gastroenterology  -Prior Cologuard negative, would like to proceed with colonoscopy, ordered.  Flu vaccine need - Plan: Flu Vaccine QUAD 6+ mos PF IM (Fluarix Quad PF)   Meds ordered this encounter  Medications   atorvastatin (LIPITOR) 20 MG tablet    Sig: Take 1 tablet (20 mg total) by mouth daily.    Dispense:  90 tablet    Refill:  1   metFORMIN (GLUCOPHAGE) 1000 MG tablet    Sig: TAKE ONE TABLET BY MOUTH TWICE A DAY    Dispense:  180 tablet    Refill:  1   sertraline (ZOLOFT) 100 MG tablet    Sig: Take 1.5 tablets (150 mg total) by mouth daily.    Dispense:  135 tablet    Refill:  1   hydrOXYzine (ATARAX) 25 MG tablet    Sig: Take 0.5-1 tablets (12.5-25 mg total) by mouth at bedtime as needed for anxiety.    Dispense:  90 tablet    Refill:  1   Patient Instructions  No med changes at this time, glad to hear new doses and meds working well.     Signed,   [name], MD [place] 04/09/22 9:00 AM

## 2022-04-15 ENCOUNTER — Encounter (INDEPENDENT_AMBULATORY_CARE_PROVIDER_SITE_OTHER): Payer: Self-pay | Admitting: Family Medicine

## 2022-04-15 ENCOUNTER — Other Ambulatory Visit (HOSPITAL_BASED_OUTPATIENT_CLINIC_OR_DEPARTMENT_OTHER): Payer: Self-pay

## 2022-04-15 ENCOUNTER — Ambulatory Visit (INDEPENDENT_AMBULATORY_CARE_PROVIDER_SITE_OTHER): Payer: Managed Care, Other (non HMO) | Admitting: Family Medicine

## 2022-04-15 VITALS — BP 115/76 | HR 75 | Temp 97.9°F | Ht 68.0 in | Wt 269.0 lb

## 2022-04-15 DIAGNOSIS — Z9189 Other specified personal risk factors, not elsewhere classified: Secondary | ICD-10-CM

## 2022-04-15 DIAGNOSIS — Z7984 Long term (current) use of oral hypoglycemic drugs: Secondary | ICD-10-CM

## 2022-04-15 DIAGNOSIS — E1169 Type 2 diabetes mellitus with other specified complication: Secondary | ICD-10-CM | POA: Diagnosis not present

## 2022-04-15 DIAGNOSIS — E559 Vitamin D deficiency, unspecified: Secondary | ICD-10-CM

## 2022-04-15 DIAGNOSIS — E669 Obesity, unspecified: Secondary | ICD-10-CM | POA: Diagnosis not present

## 2022-04-15 DIAGNOSIS — Z6841 Body Mass Index (BMI) 40.0 and over, adult: Secondary | ICD-10-CM | POA: Diagnosis not present

## 2022-04-15 DIAGNOSIS — Z7985 Long-term (current) use of injectable non-insulin antidiabetic drugs: Secondary | ICD-10-CM

## 2022-04-15 MED ORDER — TIRZEPATIDE 12.5 MG/0.5ML ~~LOC~~ SOAJ
12.5000 mg | SUBCUTANEOUS | 0 refills | Status: DC
  Filled 2022-04-15 – 2022-04-28 (×2): qty 2, 28d supply, fill #0

## 2022-04-15 MED ORDER — VITAMIN D (ERGOCALCIFEROL) 1.25 MG (50000 UNIT) PO CAPS
50000.0000 [IU] | ORAL_CAPSULE | ORAL | 0 refills | Status: DC
  Filled 2022-04-15: qty 4, 28d supply, fill #0

## 2022-04-21 NOTE — Progress Notes (Signed)
Chief Complaint:   OBESITY Brandon Rich is here to discuss his progress with his obesity treatment plan along with follow-up of his obesity related diagnoses. Brandon Rich is on the Category 3 Plan and states he is following his eating plan approximately 85% of the time. Brandon Rich states he is biking and using bands 25 minutes 3 times per week.  Today's visit was #: 24 Starting weight: 302 lbs Starting date: 12/04/2020 Today's weight: 269 lbs Today's date: 04/15/2022 Total lbs lost to date: 33 Total lbs lost since last in-office visit: 5  Interim History: Brandon Rich is here for a follow up office visit.  We reviewed his meal plan and all questions were answered.  Patient's food recall appears to be accurate and consistent with what is on plan when he is following it.   When eating on plan, his hunger and cravings are well controlled.   He can see when he decreases simple carbs and increases proteins because he has no hunger or cravings.  Subjective:   1. Type 2 diabetes mellitus with obesity ([place]) Brandon Rich is on Mounjaro 12.5 mg weekly and tolerating it well. His A1c on 02/17/2022 was 7.6. Medication management of chronic diseases per Dr. Buddy Duty of endocrinology. Pt has a Geophysicist/field seismologist. Medication: Jardiance, Metformin, Mounjaro  2. Vitamin D deficiency Pt's Vit D level 5 months ago was 54.7 and at goal.  3. At risk for activity intolerance Brandon Rich is at risk for exercise intolerance due to lack of physical activity.  Assessment/Plan:  No orders of the defined types were placed in this encounter.   Medications Discontinued During This Encounter  Medication Reason   Vitamin D, Ergocalciferol, (DRISDOL) 1.25 MG (50000 UNIT) CAPS capsule Reorder   tirzepatide (MOUNJARO) 12.5 MG/0.5ML Pen Reorder     Meds ordered this encounter  Medications   Vitamin D, Ergocalciferol, (DRISDOL) 1.25 MG (50000 UNIT) CAPS capsule    Sig: Take 1 capsule (50,000 Units total) by mouth every 7 (seven)  days.    Dispense:  4 capsule    Refill:  0    30 d supply;  ** OV for RF **   Do not send RF request   tirzepatide (MOUNJARO) 12.5 MG/0.5ML Pen    Sig: Inject 12.5 mg into the skin once a week.    Dispense:  2 mL    Refill:  0     1. Type 2 diabetes mellitus with obesity (HCC) Good blood sugar control is important to decrease the likelihood of diabetic complications such as nephropathy, neuropathy, limb loss, blindness, coronary artery disease, and death. Intensive lifestyle modification including diet, exercise and weight loss are the first line of treatment for diabetes.   Refill- tirzepatide (MOUNJARO) 12.5 MG/0.5ML Pen; Inject 12.5 mg into the skin once a week.  Dispense: 2 mL; Refill: 0  2. Vitamin D deficiency Low Vitamin D level contributes to fatigue and are associated with obesity, breast, and colon cancer. He agrees to continue to take prescription Vitamin D '@50'$ ,000 IU every week and will follow-up for routine testing of Vitamin D, at least 2-3 times per year to avoid over-replacement.  Refill- Vitamin D, Ergocalciferol, (DRISDOL) 1.25 MG (50000 UNIT) CAPS capsule; Take 1 capsule (50,000 Units total) by mouth every 7 (seven) days.  Dispense: 4 capsule; Refill: 0  3. At risk for activity intolerance Brandon Rich was given approximately 15 minutes of counseling today regarding his increased risk for exercise intolerance.  We discussed patient's specific personal and medical issues  that raise our concern.  He was advised of strategies to prevent injury and ways to improve his cardiopulmonary fitness levels slowly over time.  We additionally discussed various fitness trackers and smart phone apps to help motivate the patient to stay on track.   4. Obesity, Current BMI 40.9 Brandon Rich is currently in the action stage of change. As such, his goal is to continue with weight loss efforts. He has agreed to the Category 3 Plan.   Exercise goals: Increase as tolerated. For substantial health  benefits, adults should do at least 150 minutes (2 hours and 30 minutes) a week of moderate-intensity, or 75 minutes (1 hour and 15 minutes) a week of vigorous-intensity aerobic physical activity, or an equivalent combination of moderate- and vigorous-intensity aerobic activity. Aerobic activity should be performed in episodes of at least 10 minutes, and preferably, it should be spread throughout the week.  Behavioral modification strategies: increasing lean protein intake, decreasing simple carbohydrates, and planning for success.  Brandon Rich has agreed to follow-up with our clinic in 3-4 weeks. He was informed of the importance of frequent follow-up visits to maximize his success with intensive lifestyle modifications for his multiple health conditions.   Objective:   Blood pressure 115/76, pulse 75, temperature 97.9 F (36.6 C), height '5\' 8"'$  (1.727 m), weight 269 lb (122 kg), SpO2 95 %. Body mass index is 40.9 kg/m.  General: Cooperative, alert, well developed, in no acute distress. HEENT: Conjunctivae and lids unremarkable. Cardiovascular: Regular rhythm.  Lungs: Normal work of breathing. Neurologic: No focal deficits.   Lab Results  Component Value Date   CREATININE 0.90 11/12/2021   BUN 23 11/12/2021   NA 137 11/12/2021   K 4.2 11/12/2021   CL 100 11/12/2021   CO2 22 11/12/2021   Lab Results  Component Value Date   ALT 25 11/12/2021   AST 50 (H) 11/12/2021   ALKPHOS 60 11/12/2021   BILITOT 0.4 11/12/2021   Lab Results  Component Value Date   HGBA1C 7.6 (H) 02/17/2022   HGBA1C 7.1 (H) 11/12/2021   HGBA1C 6.3 (H) 06/17/2021   HGBA1C 6.5 (H) 03/12/2021   HGBA1C 7.7 (H) 12/04/2020   Lab Results  Component Value Date   INSULIN 5.2 12/04/2020   Lab Results  Component Value Date   TSH 1.550 12/04/2020   Lab Results  Component Value Date   CHOL 128 01/15/2022   HDL 31.40 (L) 01/15/2022   LDLCALC 62 01/15/2022   TRIG 175.0 (H) 01/15/2022   CHOLHDL 4 01/15/2022    Lab Results  Component Value Date   VD25OH 54.7 11/12/2021   VD25OH 49.8 06/17/2021   VD25OH 62.7 03/12/2021   Lab Results  Component Value Date   WBC 9.0 07/03/2021   HGB 14.9 07/03/2021   HCT 45.5 07/03/2021   MCV 82.5 07/03/2021   PLT 251.0 07/03/2021    Attestation Statements:   Reviewed by clinician on day of visit: allergies, medications, problem list, medical history, surgical history, family history, social history, and previous encounter notes.  I, Kathlene November, BS, CMA, am acting as transcriptionist for [employer], DO.  I have reviewed the above documentation for accuracy and completeness, and I agree with the above. Marjory Sneddon, D.O.  The [place] was signed into law in 2016 which includes the topic of electronic health records.  This provides immediate access to information in MyChart.  This includes consultation notes, operative notes, office notes, lab results and pathology reports.  If you have  any questions about what you read please let us know at your next visit so we can discuss your concerns and take corrective action if need be.  We are right here with you.

## 2022-04-28 ENCOUNTER — Other Ambulatory Visit (HOSPITAL_BASED_OUTPATIENT_CLINIC_OR_DEPARTMENT_OTHER): Payer: Self-pay

## 2022-05-13 ENCOUNTER — Encounter (INDEPENDENT_AMBULATORY_CARE_PROVIDER_SITE_OTHER): Payer: Self-pay | Admitting: Family Medicine

## 2022-05-13 ENCOUNTER — Other Ambulatory Visit (HOSPITAL_BASED_OUTPATIENT_CLINIC_OR_DEPARTMENT_OTHER): Payer: Self-pay

## 2022-05-13 ENCOUNTER — Ambulatory Visit (INDEPENDENT_AMBULATORY_CARE_PROVIDER_SITE_OTHER): Payer: Managed Care, Other (non HMO) | Admitting: Family Medicine

## 2022-05-13 VITALS — BP 112/71 | HR 73 | Temp 97.8°F | Ht 68.0 in | Wt 268.0 lb

## 2022-05-13 DIAGNOSIS — E1169 Type 2 diabetes mellitus with other specified complication: Secondary | ICD-10-CM | POA: Diagnosis not present

## 2022-05-13 DIAGNOSIS — E669 Obesity, unspecified: Secondary | ICD-10-CM

## 2022-05-13 DIAGNOSIS — E559 Vitamin D deficiency, unspecified: Secondary | ICD-10-CM | POA: Diagnosis not present

## 2022-05-13 DIAGNOSIS — Z7985 Long-term (current) use of injectable non-insulin antidiabetic drugs: Secondary | ICD-10-CM

## 2022-05-13 DIAGNOSIS — Z6841 Body Mass Index (BMI) 40.0 and over, adult: Secondary | ICD-10-CM | POA: Diagnosis not present

## 2022-05-13 MED ORDER — TIRZEPATIDE 12.5 MG/0.5ML ~~LOC~~ SOAJ
12.5000 mg | SUBCUTANEOUS | 0 refills | Status: DC
  Filled 2022-05-13 – 2022-05-21 (×2): qty 2, 28d supply, fill #0

## 2022-05-13 MED ORDER — VITAMIN D (ERGOCALCIFEROL) 1.25 MG (50000 UNIT) PO CAPS: 50000.0000 [IU] | ORAL_CAPSULE | ORAL | 0 refills | Status: DC

## 2022-05-21 ENCOUNTER — Other Ambulatory Visit (HOSPITAL_BASED_OUTPATIENT_CLINIC_OR_DEPARTMENT_OTHER): Payer: Self-pay

## 2022-05-24 ENCOUNTER — Other Ambulatory Visit: Payer: Self-pay | Admitting: Anesthesiology

## 2022-05-24 DIAGNOSIS — M5459 Other low back pain: Secondary | ICD-10-CM

## 2022-05-25 NOTE — Progress Notes (Signed)
Chief Complaint:   OBESITY Brandon Rich to discuss his progress with his obesity treatment plan along with follow-up of his obesity related diagnoses. Brandon Rich is on the Category 3 Plan and states he is following his eating plan approximately 85% of the time. Brandon Rich states he is riding bike/using bands 30/15 minutes 2-3 times per week.  Today's visit was #: 25 Starting weight: 302 lbs Starting date: 12/04/2020 Today's weight: 268 lbs Today's date: 05/13/2022 Total lbs lost to date: 34 lbs Total lbs lost since last in-office visit: 1  Interim History: Brandon Rich in on plan 3 for breakfast and for lunch. He is working from home. For dinner--more eating out 2-3 a week. He has 5-6 cups of coffee per day at 50 calories per cup. He is still not calculating snack calories.  Subjective:   1. Type 2 diabetes mellitus with obesity (HCC) Brandon Rich is using his Freestyle Libra meter and he loves to see what his glucose level are.  2. Vitamin D deficiency Brandon Rich's Vit D level on 4/23= 54.7.  Assessment/Plan:  No orders of the defined types were placed in this encounter.   Medications Discontinued During This Encounter  Medication Reason   Vitamin D, Ergocalciferol, (DRISDOL) 1.25 MG (50000 UNIT) CAPS capsule Reorder   tirzepatide (MOUNJARO) 12.5 MG/0.5ML Pen Reorder     Meds ordered this encounter  Medications   tirzepatide (MOUNJARO) 12.5 MG/0.5ML Pen    Sig: Inject 12.5 mg into the skin once a week.    Dispense:  2 mL    Refill:  0   Vitamin D, Ergocalciferol, (DRISDOL) 1.25 MG (50000 UNIT) CAPS capsule    Sig: Take 1 capsule (50,000 Units total) by mouth every 7 (seven) days.    Dispense:  4 capsule    Refill:  0    30 d supply;  ** OV for RF **   Do not send RF request     1. Type 2 diabetes mellitus with obesity (HCC) We will refill Mounjaro 12.5 mg SubQ once a week for 1 month with 0 refills.  -Refill tirzepatide (MOUNJARO) 12.5 MG/0.5ML Pen; Inject 12.5 mg into the  skin once a week.  Dispense: 2 mL; Refill: 0  2. Vitamin D deficiency We will refill ergocalciferol once a week for 1 month with 0 refills. Low Vitamin D level contributes to fatigue and are associated with obesity, breast, and colon cancer. He agrees to continue to take prescription Vitamin D '@50'$ ,000 IU every week and will follow-up for routine testing of Vitamin D, at least 2-3 times per year to avoid over-replacement.   -Refill Vitamin D, Ergocalciferol, (DRISDOL) 1.25 MG (50000 UNIT) CAPS capsule; Take 1 capsule (50,000 Units total) by mouth every 7 (seven) days.  Dispense: 4 capsule; Refill: 0  3. Obesity, Current BMI 40.8 Brandon Rich is currently in the action stage of change. As such, his goal is to continue with weight loss efforts. He has agreed to the Category 3 Plan, breakfast and lunch.  Exercise goals: For substantial health benefits, adults should do at least 150 minutes (2 hours and 30 minutes) a week of moderate-intensity, or 75 minutes (1 hour and 15 minutes) a week of vigorous-intensity aerobic physical activity, or an equivalent combination of moderate- and vigorous-intensity aerobic activity. Aerobic activity should be performed in episodes of at least 10 minutes, and preferably, it should be spread throughout the week.  Behavioral modification strategies: increasing lean protein intake, decreasing simple carbohydrates, and planning for success.  Brandon Rich  has agreed to follow-up with our clinic in 5 weeks. He was informed of the importance of frequent follow-up visits to maximize his success with intensive lifestyle modifications for his multiple health conditions.   Objective:   Blood pressure 112/71, pulse 73, temperature 97.8 F (36.6 C), height '5\' 8"'$  (1.727 m), weight 268 lb (121.6 kg), SpO2 98 %. Body mass index is 40.75 kg/m.  General: Cooperative, alert, well developed, in no acute distress. HEENT: Conjunctivae and lids unremarkable. Cardiovascular: Regular rhythm.   Lungs: Normal work of breathing. Neurologic: No focal deficits.   Lab Results  Component Value Date   CREATININE 0.90 11/12/2021   BUN 23 11/12/2021   NA 137 11/12/2021   K 4.2 11/12/2021   CL 100 11/12/2021   CO2 22 11/12/2021   Lab Results  Component Value Date   ALT 25 11/12/2021   AST 50 (H) 11/12/2021   ALKPHOS 60 11/12/2021   BILITOT 0.4 11/12/2021   Lab Results  Component Value Date   HGBA1C 7.6 (H) 02/17/2022   HGBA1C 7.1 (H) 11/12/2021   HGBA1C 6.3 (H) 06/17/2021   HGBA1C 6.5 (H) 03/12/2021   HGBA1C 7.7 (H) 12/04/2020   Lab Results  Component Value Date   INSULIN 5.2 12/04/2020   Lab Results  Component Value Date   TSH 1.550 12/04/2020   Lab Results  Component Value Date   CHOL 128 01/15/2022   HDL 31.40 (L) 01/15/2022   LDLCALC 62 01/15/2022   TRIG 175.0 (H) 01/15/2022   CHOLHDL 4 01/15/2022   Lab Results  Component Value Date   VD25OH 54.7 11/12/2021   VD25OH 49.8 06/17/2021   VD25OH 62.7 03/12/2021   Lab Results  Component Value Date   WBC 9.0 07/03/2021   HGB 14.9 07/03/2021   HCT 45.5 07/03/2021   MCV 82.5 07/03/2021   PLT 251.0 07/03/2021   No results found for: "IRON", "TIBC", "FERRITIN"  Attestation Statements:   Reviewed by clinician on day of visit: allergies, medications, problem list, medical history, surgical history, family history, social history, and previous encounter notes.  I, Brendell Tyus, am acting as Location manager for [employer], DO.   I have reviewed the above documentation for accuracy and completeness, and I agree with the above. Marjory Sneddon, D.O.  The [place] was signed into law in 2016 which includes the topic of electronic health records.  This provides immediate access to information in MyChart.  This includes consultation notes, operative notes, office notes, lab results and pathology reports.  If you have any questions about what you read please let us know at your next visit so  we can discuss your concerns and take corrective action if need be.  We are right Rich with you.

## 2022-06-03 ENCOUNTER — Encounter: Payer: Self-pay | Admitting: Internal Medicine

## 2022-06-16 ENCOUNTER — Ambulatory Visit (INDEPENDENT_AMBULATORY_CARE_PROVIDER_SITE_OTHER): Payer: Managed Care, Other (non HMO) | Admitting: Family Medicine

## 2022-06-17 ENCOUNTER — Other Ambulatory Visit (HOSPITAL_BASED_OUTPATIENT_CLINIC_OR_DEPARTMENT_OTHER): Payer: Self-pay

## 2022-06-17 MED ORDER — MOUNJARO 15 MG/0.5ML ~~LOC~~ SOAJ
SUBCUTANEOUS | 4 refills | Status: AC
  Filled 2022-06-17: qty 2, 28d supply, fill #0
  Filled 2022-07-20: qty 2, 28d supply, fill #1
  Filled 2022-08-13: qty 2, 28d supply, fill #2
  Filled 2022-09-09: qty 2, 28d supply, fill #3
  Filled 2022-10-07: qty 2, 28d supply, fill #4

## 2022-06-19 ENCOUNTER — Ambulatory Visit
Admission: RE | Admit: 2022-06-19 | Discharge: 2022-06-19 | Disposition: A | Discharge status: Home / Self Care | Payer: Managed Care, Other (non HMO) | Source: Ambulatory Visit | Attending: Anesthesiology | Admitting: Anesthesiology

## 2022-06-19 DIAGNOSIS — M5459 Other low back pain: Secondary | ICD-10-CM

## 2022-06-22 ENCOUNTER — Encounter (INDEPENDENT_AMBULATORY_CARE_PROVIDER_SITE_OTHER): Payer: Self-pay | Admitting: Family Medicine

## 2022-06-22 ENCOUNTER — Ambulatory Visit (INDEPENDENT_AMBULATORY_CARE_PROVIDER_SITE_OTHER): Payer: Managed Care, Other (non HMO) | Admitting: Family Medicine

## 2022-06-22 VITALS — BP 128/76 | HR 76 | Temp 98.4°F | Ht 68.0 in | Wt 271.4 lb

## 2022-06-22 DIAGNOSIS — E1169 Type 2 diabetes mellitus with other specified complication: Secondary | ICD-10-CM | POA: Diagnosis not present

## 2022-06-22 DIAGNOSIS — E669 Obesity, unspecified: Secondary | ICD-10-CM | POA: Diagnosis not present

## 2022-06-22 DIAGNOSIS — Z6841 Body Mass Index (BMI) 40.0 and over, adult: Secondary | ICD-10-CM

## 2022-06-22 DIAGNOSIS — E559 Vitamin D deficiency, unspecified: Secondary | ICD-10-CM | POA: Diagnosis not present

## 2022-06-22 DIAGNOSIS — Z7985 Long-term (current) use of injectable non-insulin antidiabetic drugs: Secondary | ICD-10-CM

## 2022-06-22 MED ORDER — VITAMIN D (ERGOCALCIFEROL) 1.25 MG (50000 UNIT) PO CAPS: 50000.0000 [IU] | ORAL_CAPSULE | ORAL | 0 refills | Status: DC

## 2022-06-28 ENCOUNTER — Encounter: Payer: Managed Care, Other (non HMO) | Admitting: Family Medicine

## 2022-06-30 ENCOUNTER — Ambulatory Visit (AMBULATORY_SURGERY_CENTER): Payer: Managed Care, Other (non HMO) | Admitting: *Deleted

## 2022-06-30 VITALS — Ht 68.0 in | Wt 277.0 lb

## 2022-06-30 DIAGNOSIS — Z1211 Encounter for screening for malignant neoplasm of colon: Secondary | ICD-10-CM

## 2022-06-30 NOTE — Progress Notes (Signed)
No egg or soy allergy known to patient  No issues known to pt with past sedation with any surgeries or procedures Patient denies ever being told they had issues or difficulty with intubation  No FH of Malignant Hyperthermia Pt is not on diet pills Pt is not on  home 02  Pt is not on blood thinners  Pt denies issues with constipation  No A fib or A flutter Have any cardiac testing pending--no Pt instructed to use [email or web address] or GoodRx for a price reduction on prep   

## 2022-07-02 ENCOUNTER — Ambulatory Visit (INDEPENDENT_AMBULATORY_CARE_PROVIDER_SITE_OTHER): Payer: Managed Care, Other (non HMO) | Admitting: Family Medicine

## 2022-07-02 ENCOUNTER — Encounter: Payer: Self-pay | Admitting: Family Medicine

## 2022-07-02 VITALS — BP 136/70 | HR 76 | Temp 98.6°F | Ht 68.0 in | Wt 279.6 lb

## 2022-07-02 DIAGNOSIS — E1142 Type 2 diabetes mellitus with diabetic polyneuropathy: Secondary | ICD-10-CM

## 2022-07-02 DIAGNOSIS — Z794 Long term (current) use of insulin: Secondary | ICD-10-CM | POA: Diagnosis not present

## 2022-07-02 DIAGNOSIS — F411 Generalized anxiety disorder: Secondary | ICD-10-CM

## 2022-07-02 DIAGNOSIS — E785 Hyperlipidemia, unspecified: Secondary | ICD-10-CM | POA: Diagnosis not present

## 2022-07-02 DIAGNOSIS — F4323 Adjustment disorder with mixed anxiety and depressed mood: Secondary | ICD-10-CM | POA: Diagnosis not present

## 2022-07-02 DIAGNOSIS — Z0000 Encounter for general adult medical examination without abnormal findings: Secondary | ICD-10-CM

## 2022-07-02 DIAGNOSIS — I10 Essential (primary) hypertension: Secondary | ICD-10-CM

## 2022-07-02 DIAGNOSIS — F5104 Psychophysiologic insomnia: Secondary | ICD-10-CM

## 2022-07-02 DIAGNOSIS — Z2911 Encounter for prophylactic immunotherapy for respiratory syncytial virus (RSV): Secondary | ICD-10-CM

## 2022-07-02 LAB — COMPREHENSIVE METABOLIC PANEL
ALT: 20 U/L (ref 0–53)
AST: 36 U/L (ref 0–37)
Albumin: 4.8 g/dL (ref 3.5–5.2)
Alkaline Phosphatase: 56 U/L (ref 39–117)
BUN: 21 mg/dL (ref 6–23)
CO2: 25 mEq/L (ref 19–32)
Calcium: 9.7 mg/dL (ref 8.4–10.5)
Chloride: 100 mEq/L (ref 96–112)
Creatinine, Ser: 0.94 mg/dL (ref 0.40–1.50)
GFR: 86.88 mL/min (ref >60.00)
Glucose, Bld: 195 mg/dL — ABNORMAL HIGH (ref 70–99)
Potassium: 4 mEq/L (ref 3.5–5.1)
Sodium: 135 mEq/L (ref 135–145)
Total Bilirubin: 0.4 mg/dL (ref 0.2–1.2)
Total Protein: 7.5 g/dL (ref 6.0–8.3)

## 2022-07-02 LAB — MICROALBUMIN / CREATININE URINE RATIO
Creatinine,U: 63 mg/dL
Microalb Creat Ratio: 8.7 mg/g (ref 0.0–30.0)
Microalb, Ur: 5.4 mg/dL — ABNORMAL HIGH (ref 0.0–1.9)

## 2022-07-02 LAB — LDL CHOLESTEROL, DIRECT: Direct LDL: 87 mg/dL

## 2022-07-02 LAB — LIPID PANEL
Cholesterol: 154 mg/dL (ref 0–200)
HDL: 33.7 mg/dL — ABNORMAL LOW (ref >39.00)
NonHDL: 120.25
Total CHOL/HDL Ratio: 5
Triglycerides: 283 mg/dL — ABNORMAL HIGH (ref 0.0–149.0)
VLDL: 56.6 mg/dL — ABNORMAL HIGH (ref 0.0–40.0)

## 2022-07-02 MED ORDER — GABAPENTIN 300 MG PO CAPS: 300.0000 mg | ORAL_CAPSULE | Freq: Every day | ORAL | 1 refills | Status: DC

## 2022-07-02 MED ORDER — SERTRALINE HCL 100 MG PO TABS: 150.0000 mg | ORAL_TABLET | Freq: Every day | ORAL | 1 refills | Status: DC

## 2022-07-02 MED ORDER — HYDROXYZINE HCL 25 MG PO TABS: 12.5000 mg | ORAL_TABLET | Freq: Every evening | ORAL | 1 refills | Status: DC | PRN

## 2022-07-02 MED ORDER — ATORVASTATIN CALCIUM 20 MG PO TABS: 20.0000 mg | ORAL_TABLET | Freq: Every day | ORAL | 1 refills | Status: DC

## 2022-07-02 NOTE — Patient Instructions (Addendum)
Try '300mg'$  gabapentin at night. Option of daytime (morning) dose, but that may cause sedation. Let me know if you use it more than once per day.  147mn per week goal exercise.  No other med changes today. Take care!  Preventive Care [id number] Old, Male Preventive care refers to lifestyle choices and visits with your health care provider that can promote health and wellness. Preventive care visits are also called wellness exams. What can I expect for my preventive care visit? Counseling During your preventive care visit, your health care provider may ask about your: Medical history, including: Past medical problems. Family medical history. Current health, including: Emotional well-being. Home life and relationship well-being. Sexual activity. Lifestyle, including: Alcohol, nicotine or tobacco, and drug use. Access to firearms. Diet, exercise, and sleep habits. Safety issues such as seatbelt and bike helmet use. Sunscreen use. Work and work [employer] Physical exam Your health care provider will check your: Height and weight. These may be used to calculate your BMI (body mass index). BMI is a measurement that tells if you are at a healthy weight. Waist circumference. This measures the distance around your waistline. This measurement also tells if you are at a healthy weight and may help predict your risk of certain diseases, such as type 2 diabetes and high blood pressure. Heart rate and blood pressure. Body temperature. Skin for abnormal spots. What immunizations do I need?  Vaccines are usually given at various ages, according to a schedule. Your health care provider will recommend vaccines for you based on your age, medical history, and lifestyle or other factors, such as travel or where you work. What tests do I need? Screening Your health care provider may recommend screening tests for certain conditions. This may include: Lipid and cholesterol levels. Diabetes screening.  This is done by checking your blood sugar (glucose) after you have not eaten for a while (fasting). Hepatitis B test. Hepatitis C test. HIV (human immunodeficiency virus) test. STI (sexually transmitted infection) testing, if you are at risk. Lung cancer screening. Prostate cancer screening. Colorectal cancer screening. Talk with your health care provider about your test results, treatment options, and if necessary, the need for more tests. Follow these instructions at home: Eating and drinking  Eat a diet that includes fresh fruits and vegetables, whole grains, lean protein, and low-fat dairy products. Take vitamin and mineral supplements as recommended by your health care provider. Do not drink alcohol if your health care provider tells you not to drink. If you drink alcohol: Limit how much you have to 0-2 drinks a day. Know how much alcohol is in your drink. In the [place] one drink equals one 12 oz bottle of beer (355 mL), one 5 oz glass of wine (148 mL), or one 1 oz glass of hard liquor (44 mL). Lifestyle Brush your teeth every morning and night with fluoride toothpaste. Floss one time each day. Exercise for at least 30 minutes 5 or more days each week. Do not use any products that contain nicotine or tobacco. These products include cigarettes, chewing tobacco, and vaping devices, such as e-cigarettes. If you need help quitting, ask your health care provider. Do not use drugs. If you are sexually active, practice safe sex. Use a condom or other form of protection to prevent STIs. Take aspirin only as told by your health care provider. Make sure that you understand how much to take and what form to take. Work with your health care provider to find out whether it is  safe and beneficial for you to take aspirin daily. Find healthy ways to manage stress, such as: Meditation, yoga, or listening to music. Journaling. Talking to a trusted person. Spending time with friends and  family. Minimize exposure to UV radiation to reduce your risk of skin cancer. Safety Always wear your seat belt while driving or riding in a vehicle. Do not drive: If you have been drinking alcohol. Do not ride with someone who has been drinking. When you are tired or distracted. While texting. If you have been using any mind-altering substances or drugs. Wear a helmet and other protective equipment during sports activities. If you have firearms in your house, make sure you follow all gun safety procedures. What's next? Go to your health care provider once a year for an annual wellness visit. Ask your health care provider how often you should have your eyes and teeth checked. Stay up to date on all vaccines. This information is not intended to replace advice given to you by your health care provider. Make sure you discuss any questions you have with your health care provider. Document Revised: 12/31/2020 Document Reviewed: 12/31/2020 [employer] Patient Education  [place].

## 2022-07-02 NOTE — Progress Notes (Signed)
Subjective:  Patient ID: Brandon Rich, male    DOB: 1960/11/08  Age: 61 y.o. MRN: [id number]  CC:  Chief Complaint  Patient presents with   Annual Exam    Pt states he wants to know about an increase in his gabapentin    HPI Brandon Rich presents for Annual Exam   Diabetes: With obesity, diabetic neuropathy Endocrinologist Dr. Buddy Duty, A1c 7.9 on November 30. Also followed by Dr. Raliegh Scarlet with weight management.  Visit December 5.  Treated with tirzepatide 15 mg once a week, recent increased dose, still on metformin and Jardiance, levemir 60u per day.   Also on vitamin D for deficiency.  On ACE inhibitor and statin. Gabapentin for diabetic neuropathy, 200 mg at his September visit, would like to try increased dose. Has some phantom pain in R foot and bone spur in foot, planned foot surgery in January. Recommended increased dose. Some sxs in the morning, but usually at night.   Microalbumin: Due Optho, foot exam, pneumovax: Up-to-date  Hypertension: With OSA on CPAP, working well.  Followed by sleep specialist and cardiologist is Dr. Percival Spanish.  Treated with 40 mg lisinopril daily.no new side effects.  Home readings:130/80. 125/70.  BP Readings from Last 3 Encounters:  07/02/22 136/70  06/22/22 128/76  05/13/22 112/71   Lab Results  Component Value Date   CREATININE 0.90 11/12/2021   Anxiety with insomnia Treated with sertraline, dosage increased in June to 150 mg daily.  Improved control at that dose at his September visit, hydroxyzine and gabapentin at bedtime with stable sleep at that time. Still using hydroxyzine - helping. Overall doing well. Some foot pain as above.   Hyperlipidemia: Lipitor 20 mg daily, no new myalgias/side effects.  Lab Results  Component Value Date   CHOL 128 01/15/2022   HDL 31.40 (L) 01/15/2022   LDLCALC 62 01/15/2022   TRIG 175.0 (H) 01/15/2022   CHOLHDL 4 01/15/2022   Lab Results  Component Value Date   ALT 25 11/12/2021   AST 50  (H) 11/12/2021   ALKPHOS 60 11/12/2021   BILITOT 0.4 11/12/2021         07/02/2022    8:07 AM 01/01/2022    8:49 AM 07/03/2021    8:00 AM 12/26/2020    8:41 AM 12/04/2020    7:49 AM  Depression screen PHQ 2/9  Decreased Interest 0 0 0 0 2  Down, Depressed, Hopeless 0 1 0 0 1  PHQ - 2 Score 0 1 0 0 3  Altered sleeping 1 1 0 1 1  Tired, decreased energy 1 0 0 0 3  Change in appetite 1 0 0 1 1  Feeling bad or failure about yourself  0 0 0 0 1  Trouble concentrating 0 0 0 0 1  Moving slowly or fidgety/restless 0 0 0 0 1  Suicidal thoughts 0 0 0 0 0  PHQ-9 Score 3 2 0 2 11  Difficult doing work/chores   Not difficult at all  Somewhat difficult    Health Maintenance  Topic Date Due   Diabetic kidney evaluation - Urine ACR  12/27/2020   COVID-19 Vaccine (4 - 2023-24 season) 07/18/2022 (Originally 03/19/2022)   Fecal DNA (Cologuard)  04/10/2023 (Originally 12/13/2021)   HEMOGLOBIN A1C  08/20/2022   OPHTHALMOLOGY EXAM  11/11/2022   Diabetic kidney evaluation - eGFR measurement  11/13/2022   FOOT EXAM  01/02/2023   DTaP/Tdap/Td (2 - Td or Tdap) 12/17/2026   INFLUENZA VACCINE  Completed  Hepatitis C Screening  Completed   HIV Screening  Completed   Zoster Vaccines- Shingrix  Completed   HPV VACCINES  Aged Out  Negative Cologuard May 2020, previous diverticulosis on colonoscopy in 2005 but no polyps.  Plan on colonoscopy in January - Jan 9th.  Prostate: does have family history of prostate cancer - father.  The natural history of prostate cancer and ongoing controversy regarding screening and potential treatment outcomes of prostate cancer has been discussed with the patient. The meaning of a false positive PSA and a false negative PSA has been discussed. He indicates understanding of the limitations of this screening test and wishes to proceed with screening PSA testing. Lab Results  Component Value Date   PSA1 2.2 12/28/2019   PSA1 2.2 12/01/2018   PSA1 1.3 09/20/2017   PSA 2.75  07/03/2021   PSA 1.17 01/09/2016   PSA 1.29 06/16/2015      Immunization History  Administered Date(s) Administered   Influenza Split 04/04/2012, 04/14/2013, 03/25/2019   Influenza Whole 09/05/2017   Influenza,inj,Quad PF,6+ Mos 04/14/2013, 04/24/2014, 06/16/2015, 03/27/2016, 04/16/2017, 04/10/2018, 04/09/2022   Influenza-Unspecified 04/16/2017   PFIZER(Purple Top)SARS-COV-2 Vaccination 10/13/2019, 11/06/2019, 03/18/2020   Pneumococcal Conjugate-13 06/16/2015   Pneumococcal Polysaccharide-23 07/19/2006, 06/06/2014   Tdap 12/16/2016   Zoster Recombinat (Shingrix) 12/16/2018, 03/25/2019  RSV vaccine-  requests today.  Covid booster - recommended at pharmacy.   No results found. Optho - past few months. Dr. Stephanie Coup  Dental:Within Last 6 months every 4 months.   Alcohol: rare glass of wine, no regular use.   Tobacco: none  Exercise:2-3 days per week on exercise bike 30 min.    History Patient Active Problem List   Diagnosis Date Noted   At risk for hyperglycemia 03/15/2022   Polyphagia 84/13/2440   Nonalcoholic hepatosteatosis 05/15/2535   Other fatigue 12/04/2020   SOBOE (shortness of breath on exertion) 12/04/2020   Vitamin D deficiency 12/04/2020   Depression 12/04/2020   Class 3 severe obesity due to excess calories with serious comorbidity and body mass index (BMI) of 40.0 to 44.9 in adult [facility]) 09/10/2019   Chest discomfort 02/02/2019   Morbid obesity ([place]) 02/02/2019   Cortical age-related cataract of both eyes 03/30/2017   Nuclear sclerotic cataract of both eyes 03/30/2017   Primary open angle glaucoma (POAG) of both eyes, indeterminate stage 03/30/2017   Type 2 diabetes mellitus with foot ulcer, with long-term current use of insulin ([place]) 03/30/2017   LVH (left ventricular hypertrophy) 03/29/2016   T wave inversion in EKG 02/16/2016   Transaminitis 02/16/2016   Hammertoe of left foot 08/23/2013   Pain of left great toe 08/23/2013   Hypertension associated  with type 2 diabetes mellitus ([place]) 12/20/2011   Diabetes mellitus ([place]) 12/20/2011   Hyperlipidemia associated with type 2 diabetes mellitus ([place]) 12/20/2011   Glaucoma (increased eye pressure) 07/19/1996   Past Medical History:  Diagnosis Date   Acute combined systolic and diastolic heart failure ([place]) 02/16/2016   Allergy    Anxiety    Phreesia 12/27/2019   Anxiety    DDD (degenerative disc disease), cervical    Depression    Diabetes mellitus    Diabetes mellitus without complication ([place])    Phreesia 12/27/2019   Diabetic foot ulcer ([facility])    FUO (fever of unknown origin) 02/16/2016   Glaucoma    Heartburn    Hyperlipidemia    Hypertension    LVH (left ventricular hypertrophy) 03/29/2016   Palpitation    Rash and nonspecific skin  eruption 02/16/2016   Sleep apnea    CPAP   SOBOE (shortness of breath on exertion)    Transaminitis 02/16/2016   Past Surgical History:  Procedure Laterality Date   AMPUTATION TOE Right 04/2020   AMPUTATION TOE Right 01/2020   HERNIA REPAIR     SPINE SURGERY     L4/L5   TUMOR REMOVAL  12/1998   Bone Cyst   Allergies  Allergen Reactions   Other Other (See Comments)    Hayfever   Prior to Admission medications   Medication Sig Start Date End Date Taking? Authorizing Provider  Ascorbic Acid (VITAMIN C) 1000 MG tablet Take 1,000 mg by mouth daily.    [provider]  aspirin 81 MG tablet Take 81 mg by mouth daily.    [provider]  atorvastatin (LIPITOR) 20 MG tablet Take 1 tablet (20 mg total) by mouth daily. 04/09/22   [name], MD  BD PEN NEEDLE NANO U/F 32G X 4 MM MISC  07/04/18   [provider]  calcium carbonate (TUMS - DOSED IN MG ELEMENTAL CALCIUM) 500 MG chewable tablet Chew 2 tablets by mouth daily as needed for indigestion or heartburn.    [provider]  dorzolamide-timolol (COSOPT) 22.3-6.8 MG/ML ophthalmic solution 1 drop 2 (two) times daily.    [provider]   empagliflozin (JARDIANCE) 25 MG TABS tablet Take 25 mg by mouth daily. 12/16/16   [name], MD  fexofenadine (ALLEGRA) 180 MG tablet Take 180 mg by mouth daily.    [provider]  fluticasone (FLONASE) 50 MCG/ACT nasal spray PLACE 2 SPRAYS INTO BOTH NOSTRILS DAILY. 07/09/14   Orma Flaming, MD  gabapentin (NEURONTIN) 100 MG capsule Take 1-2 capsules (100-200 mg total) by mouth at bedtime. 01/01/22   [name], MD  hydrOXYzine (ATARAX) 25 MG tablet Take 0.5-1 tablets (12.5-25 mg total) by mouth at bedtime as needed for anxiety. 04/09/22   [name], MD  Insulin Detemir (LEVEMIR FLEXPEN [place]) Inject 60 Units into the skin daily.    [provider]  lisinopril (ZESTRIL) 40 MG tablet Take 1 tablet (40 mg total) by mouth daily. 07/03/21   [name], MD  metFORMIN (GLUCOPHAGE) 1000 MG tablet TAKE ONE TABLET BY MOUTH TWICE A DAY 04/09/22   [name], MD  Multiple Vitamin (MULTIVITAMIN) tablet Take 1 tablet by mouth every evening.     [provider]  sertraline (ZOLOFT) 100 MG tablet Take 1.5 tablets (150 mg total) by mouth daily. 04/09/22   [name], MD  sildenafil (VIAGRA) 100 MG tablet Take 0.5-1 tablets (50-100 mg total) by mouth daily as needed for erectile dysfunction. 01/01/22   [name], MD  tirzepatide [facility]) 15 MG/0.5ML Pen Inject 15 mg Subcutaneously once a week 90 days 06/17/22     Travoprost, BAK Free, (TRAVATAN) 0.004 % SOLN ophthalmic solution SMARTSIG:In Eye(s) 01/28/21   [provider]  Vitamin D, Ergocalciferol, (DRISDOL) 1.25 MG (50000 UNIT) CAPS capsule Take 1 capsule (50,000 Units total) by mouth every 7 (seven) days. 06/22/22   Mellody Dance, DO   Social History   Socioeconomic History   Marital status: Married    Spouse name: Randi [employer]   Number of children: 2   Years of education: 16   Highest education level: Not on file  Occupational History   Occupation: Tour manager   Tobacco Use   Smoking status: Never   Smokeless tobacco: Never  Vaping  Use   Vaping Use: Never used  Substance and Sexual Activity   Alcohol use: No    Alcohol/week: 0.0 standard drinks of alcohol    Comment: per pt 1 drink once in a long while   Drug use: No   Sexual activity: Yes    Partners: Female  Other Topics Concern   Not on file  Social History Narrative   Married. Education: [employer].    Social Determinants of Health   Financial Resource Strain: Not on file  Food Insecurity: Not on file  Transportation Needs: Not on file  Physical Activity: Not on file  Stress: Not on file  Social Connections: Not on file  Intimate Partner Violence: Not on file    Review of Systems 13 point review of systems per patient health survey noted.  Negative other than as indicated above or in HPI.    Objective:   Vitals:   07/02/22 0811  BP: 136/70  Pulse: 76  Temp: 98.6 F (37 C)  SpO2: 95%  Weight: 279 lb 9.6 oz (126.8 kg)  Height: _0  (1.727 m)     Physical Exam Vitals reviewed.  Constitutional:      Appearance: He is well-developed.  HENT:     Head: Normocephalic and atraumatic.     Right Ear: External ear normal.     Left Ear: External ear normal.  Eyes:     Conjunctiva/sclera: Conjunctivae normal.     Pupils: Pupils are equal, round, and reactive to light.  Neck:     Thyroid: No thyromegaly.  Cardiovascular:     Rate and Rhythm: Normal rate and regular rhythm.     Heart sounds: Normal heart sounds.  Pulmonary:     Effort: Pulmonary effort is normal. No respiratory distress.     Breath sounds: Normal breath sounds. No wheezing.  Abdominal:     General: There is no distension.     Palpations: Abdomen is soft.     Tenderness: There is no abdominal tenderness.  Musculoskeletal:        General: No tenderness. Normal range of motion.     Cervical back: Normal range of motion and neck supple.  Lymphadenopathy:     Cervical: No cervical adenopathy.  Skin:     General: Skin is warm and dry.  Neurological:     Mental Status: He is alert and oriented to person, place, and time.     Deep Tendon Reflexes: Reflexes are normal and symmetric.  Psychiatric:        Behavior: Behavior normal.        Assessment & Plan:  SADIE PICKAR is a 61 y.o. male . Annual physical exam  - -anticipatory guidance as below in AVS, screening labs above. Health maintenance items as above in HPI discussed/recommended as applicable.   Essential hypertension - Plan: atorvastatin (LIPITOR) 20 MG tablet, Comprehensive metabolic panel  - Stable, tolerating current regimen. Medications refilled. Labs pending as above.   Hyperlipidemia, unspecified hyperlipidemia type - Plan: atorvastatin (LIPITOR) 20 MG tablet, Comprehensive metabolic panel, Lipid panel  - Stable, tolerating current regimen. Medications refilled. Labs pending as above.   Adjustment disorder with mixed anxiety and depressed mood - Plan: hydrOXYzine (ATARAX) 25 MG tablet Psychophysiological insomnia - Plan: hydrOXYzine (ATARAX) 25 MG tablet Anxiety state - Plan: sertraline (ZOLOFT) 100 MG tablet  -  Stable, tolerating current regimen. Medications refilled.   Type 2 diabetes mellitus with diabetic polyneuropathy, with long-term current use of insulin ([place]) - Plan: gabapentin (  NEURONTIN) 300 MG capsule, Microalbumin / creatinine urine ratio  -Recent increased dose of Mounjaro, followed by weight management as well as endocrinology.  Some persistent neuropathic symptoms, foot pains, will try higher dose of gabapentin 300 mg at night, evening dosing as opposed to bedtime may be more effective and option of morning dosing with potential side effects discussed.  Advised to let me know what dosing he ends up on but for now we will prescribe at daily dosing.  76-monthfollow-up.  RTC precautions.  Need for RSV vaccination - Plan: RSV,Recombinant PF (Arexvy) given.   Meds ordered this encounter  Medications    atorvastatin (LIPITOR) 20 MG tablet    Sig: Take 1 tablet (20 mg total) by mouth daily.    Dispense:  90 tablet    Refill:  1   hydrOXYzine (ATARAX) 25 MG tablet    Sig: Take 0.5-1 tablets (12.5-25 mg total) by mouth at bedtime as needed for anxiety.    Dispense:  90 tablet    Refill:  1   sertraline (ZOLOFT) 100 MG tablet    Sig: Take 1.5 tablets (150 mg total) by mouth daily.    Dispense:  135 tablet    Refill:  1   gabapentin (NEURONTIN) 300 MG capsule    Sig: Take 1 capsule (300 mg total) by mouth at bedtime.    Dispense:  90 capsule    Refill:  1   Patient Instructions  Try 3053mgabapentin at night. Option of daytime (morning) dose, but that may cause sedation. Let me know if you use it more than once per day.  15070mper week goal exercise.  No other med changes today. Take care!  Preventive Care 40-97 46ars Old, Male Preventive care refers to lifestyle choices and visits with your health care provider that can promote health and wellness. Preventive care visits are also called wellness exams. What can I expect for my preventive care visit? Counseling During your preventive care visit, your health care provider may ask about your: Medical history, including: Past medical problems. Family medical history. Current health, including: Emotional well-being. Home life and relationship well-being. Sexual activity. Lifestyle, including: Alcohol, nicotine or tobacco, and drug use. Access to firearms. Diet, exercise, and sleep habits. Safety issues such as seatbelt and bike helmet use. Sunscreen use. Work and work envStatisticianhysical exam Your health care provider will check your: Height and weight. These may be used to calculate your BMI (body mass index). BMI is a measurement that tells if you are at a healthy weight. Waist circumference. This measures the distance around your waistline. This measurement also tells if you are at a healthy weight and may help predict your  risk of certain diseases, such as type 2 diabetes and high blood pressure. Heart rate and blood pressure. Body temperature. Skin for abnormal spots. What immunizations do I need?  Vaccines are usually given at various ages, according to a schedule. Your health care provider will recommend vaccines for you based on your age, medical history, and lifestyle or other factors, such as travel or where you work. What tests do I need? Screening Your health care provider may recommend screening tests for certain conditions. This may include: Lipid and cholesterol levels. Diabetes screening. This is done by checking your blood sugar (glucose) after you have not eaten for a while (fasting). Hepatitis B test. Hepatitis C test. HIV (human immunodeficiency virus) test. STI (sexually transmitted infection) testing, if you are at risk. Lung cancer screening. Prostate  cancer screening. Colorectal cancer screening. Talk with your health care provider about your test results, treatment options, and if necessary, the need for more tests. Follow these instructions at home: Eating and drinking  Eat a diet that includes fresh fruits and vegetables, whole grains, lean protein, and low-fat dairy products. Take vitamin and mineral supplements as recommended by your health care provider. Do not drink alcohol if your health care provider tells you not to drink. If you drink alcohol: Limit how much you have to 0-2 drinks a day. Know how much alcohol is in your drink. In the [place] one drink equals one 12 oz bottle of beer (355 mL), one 5 oz glass of wine (148 mL), or one 1 oz glass of hard liquor (44 mL). Lifestyle Brush your teeth every morning and night with fluoride toothpaste. Floss one time each day. Exercise for at least 30 minutes 5 or more days each week. Do not use any products that contain nicotine or tobacco. These products include cigarettes, chewing tobacco, and vaping devices, such as e-cigarettes.  If you need help quitting, ask your health care provider. Do not use drugs. If you are sexually active, practice safe sex. Use a condom or other form of protection to prevent STIs. Take aspirin only as told by your health care provider. Make sure that you understand how much to take and what form to take. Work with your health care provider to find out whether it is safe and beneficial for you to take aspirin daily. Find healthy ways to manage stress, such as: Meditation, yoga, or listening to music. Journaling. Talking to a trusted person. Spending time with friends and family. Minimize exposure to UV radiation to reduce your risk of skin cancer. Safety Always wear your seat belt while driving or riding in a vehicle. Do not drive: If you have been drinking alcohol. Do not ride with someone who has been drinking. When you are tired or distracted. While texting. If you have been using any mind-altering substances or drugs. Wear a helmet and other protective equipment during sports activities. If you have firearms in your house, make sure you follow all gun safety procedures. What's next? Go to your health care provider once a year for an annual wellness visit. Ask your health care provider how often you should have your eyes and teeth checked. Stay up to date on all vaccines. This information is not intended to replace advice given to you by your health care provider. Make sure you discuss any questions you have with your health care provider. Document Revised: 12/31/2020 Document Reviewed: 12/31/2020 Elsevier Patient Education  [place],   [name], MD [place] 07/02/22 8:58 AM

## 2022-07-05 DIAGNOSIS — E1169 Type 2 diabetes mellitus with other specified complication: Secondary | ICD-10-CM | POA: Insufficient documentation

## 2022-07-07 NOTE — Progress Notes (Signed)
Chief Complaint:   OBESITY Brandon Rich is here to discuss his progress with his obesity treatment plan along with follow-up of his obesity related diagnoses. Brandon Rich is on the Category 3 Plan with breakfast and lunch options and states he is following his eating plan approximately 80% of the time. Brandon Rich states he is biking, stretching 30-40 minutes 2-3 times per week.  Today's visit was #: 18 Starting weight: 302 LBS Starting date: 12/04/2020 Today's weight: 271 LBS Today's date: 06/22/2022 Total lbs lost to date: 31 lbs Total lbs lost since last in-office visit: +3 LBS  Interim History: Brandon Rich still struggles with his snacking, he has been having more snack calories than he should.  His dog recently died last month, but patient doing much better emotionally now.  Subjective:   1. Type 2 diabetes mellitus with obesity ([place]) Patient has continuous glucose monitor.  He sees Dr. Buddy Duty, he was changed to higher dose Mounjaro last week.  Patient will be taking 15 mg weekly in the near future.  He likes seeing what certain foods does to his blood sugars.  2. Vitamin D deficiency Missed one dose recently but otherwise take as prescribed.  He is tolerating it well.  Assessment/Plan:  No orders of the defined types were placed in this encounter.   Medications Discontinued During This Encounter  Medication Reason   tirzepatide (MOUNJARO) 12.5 MG/0.5ML Pen Change in therapy   Vitamin D, Ergocalciferol, (DRISDOL) 1.25 MG (50000 UNIT) CAPS capsule Reorder     Meds ordered this encounter  Medications   Vitamin D, Ergocalciferol, (DRISDOL) 1.25 MG (50000 UNIT) CAPS capsule    Sig: Take 1 capsule (50,000 Units total) by mouth every 7 (seven) days.    Dispense:  4 capsule    Refill:  0    30 d supply;  ** OV for RF **   Do not send RF request     1. Type 2 diabetes mellitus with obesity ([place]) Continue medication management per endocrinology.  Advised patient not to skip meals and increase  protein intake.  Had labs with Dr.Kerr's office, will bring in at next office visit.  2. Vitamin D deficiency Obtain labs from endocrinology and will repeat the ones not obtained. Low Vitamin D level contributes to fatigue and are associated with obesity, breast, and colon cancer. He agrees to continue to take prescription Vitamin D '@50'$ ,000 IU every week and will follow-up for routine testing of Vitamin D, at least 2-3 times per year to avoid over-replacement.  Refill- Vitamin D, Ergocalciferol, (DRISDOL) 1.25 MG (50000 UNIT) CAPS capsule; Take 1 capsule (50,000 Units total) by mouth every 7 (seven) days.  Dispense: 4 capsule; Refill: 0  3. Obesity, Current BMI 41.3 [name] is currently in the action stage of change. As such, his goal is to continue with weight loss efforts. He has agreed to the Category 3 Plan with breakfast and lunch options..   Exercise goals:  As is, increase as tolerated.  Behavioral modification strategies: holiday eating strategies  and celebration eating strategies.  Brandon Rich has agreed to follow-up with our clinic in 3-4 weeks. He was informed of the importance of frequent follow-up visits to maximize his success with intensive lifestyle modifications for his multiple health conditions.   Objective:   Blood pressure 128/76, pulse 76, temperature 98.4 F (36.9 C), height '5\' 8"'$  (1.727 m), weight 271 lb 6.4 oz (123.1 kg), SpO2 98 %. Body mass index is 41.27 kg/m.  General: Cooperative, alert, well developed, in  no acute distress. HEENT: Conjunctivae and lids unremarkable. Cardiovascular: Regular rhythm.  Lungs: Normal work of breathing. Neurologic: No focal deficits.   Lab Results  Component Value Date   CREATININE 0.94 07/02/2022   BUN 21 07/02/2022   NA 135 07/02/2022   K 4.0 07/02/2022   CL 100 07/02/2022   CO2 25 07/02/2022   Lab Results  Component Value Date   ALT 20 07/02/2022   AST 36 07/02/2022   ALKPHOS 56 07/02/2022   BILITOT 0.4 07/02/2022    Lab Results  Component Value Date   HGBA1C 7.6 (H) 02/17/2022   HGBA1C 7.1 (H) 11/12/2021   HGBA1C 6.3 (H) 06/17/2021   HGBA1C 6.5 (H) 03/12/2021   HGBA1C 7.7 (H) 12/04/2020   Lab Results  Component Value Date   INSULIN 5.2 12/04/2020   Lab Results  Component Value Date   TSH 1.550 12/04/2020   Lab Results  Component Value Date   CHOL 154 07/02/2022   HDL 33.70 (L) 07/02/2022   LDLCALC 62 01/15/2022   LDLDIRECT 87.0 07/02/2022   TRIG 283.0 (H) 07/02/2022   CHOLHDL 5 07/02/2022   Lab Results  Component Value Date   VD25OH 54.7 11/12/2021   VD25OH 49.8 06/17/2021   VD25OH 62.7 03/12/2021   Lab Results  Component Value Date   WBC 9.0 07/03/2021   HGB 14.9 07/03/2021   HCT 45.5 07/03/2021   MCV 82.5 07/03/2021   PLT 251.0 07/03/2021   No results found for: "IRON", "TIBC", "FERRITIN"  Attestation Statements:   Reviewed by clinician on day of visit: allergies, medications, problem list, medical history, surgical history, family history, social history, and previous encounter notes.  I, Davy Pique, RMA, am acting as Location manager for [employer], DO.   I have reviewed the above documentation for accuracy and completeness, and I agree with the above. Marjory Sneddon, D.O.  The [place] was signed into law in 2016 which includes the topic of electronic health records.  This provides immediate access to information in MyChart.  This includes consultation notes, operative notes, office notes, lab results and pathology reports.  If you have any questions about what you read please let us know at your next visit so we can discuss your concerns and take corrective action if need be.  We are right here with you.

## 2022-07-15 NOTE — Progress Notes (Signed)
TeleHealth Visit:  This visit was completed with telemedicine (audio/video) technology. [name] has verbally consented to this TeleHealth visit. The patient is located at home, the provider is located at home. The participants in this visit include the listed provider and patient. The visit was conducted today via MyChart video.  OBESITY Brandon Rich is here to discuss his progress with his obesity treatment plan along with follow-up of his obesity related diagnoses.   Today's visit was # 27 Starting weight: 302 lbs Starting date: 12/04/2020 Weight at last in office visit: 271 lbs on 06/22/22 Total weight loss: 31 lbs at last in office visit on 06/22/22. Today's reported weight: 275 lbs   Nutrition Plan:  Category 3 Plan with breakfast and lunch options.   Current exercise: sporadic over holidays - biking, stretching 30-40 minutes 2-3 times per week.  Interim History:  Weight has been plateaued for over a year. Brandon Rich reports he did not meet his goal of maintaining his weight over the holidays.  He reports gaining about 4 pounds since his last visit on 06/22/2022.  He is starting back today on the meal plan. He reports exercise has been sporadic over the holidays also.   Mounjaro was increased to 15 mg weekly and he notices decreased cravings and more satiety.   He is having a colonoscopy next week and then toe surgery in 2 weeks which will require him to be nonweightbearing and then partially weightbearing for a few months.  He would like strategies to continue with weight loss while he is nonweightbearing.  Assessment/Plan:  1. Type II Diabetes HgbA1c is not at goal. Last A1c was 7.8 or 7.9 (he is unsure). Sees Dr. Buddy Duty CBGs: higher over the holidays- has CGM Episodes of hypoglycemia: no Medication(s): Levimir 60 units daily, Mounjaro 15 mg weekly, Jardiance 25 mg daily, metformin 1000 mg BID  Lab Results  Component Value Date   HGBA1C 7.6 (H) 02/17/2022   HGBA1C 7.1 (H)  11/12/2021   HGBA1C 6.3 (H) 06/17/2021   Lab Results  Component Value Date   MICROALBUR 5.4 (H) 07/02/2022   LDLCALC 62 01/15/2022   CREATININE 0.94 07/02/2022    Plan: Continue all medications at current dosages. Check A1c at next in office visit. Discussed bariatric surgery briefly and its benefits for improving diabetes.  He is not interested in bariatric surgery currently.  He would like to continue with lifestyle modifications feels that he can be more consistent with adhering to plan.   2. Vitamin D Deficiency Vitamin D is at goal of 50. Last vit D level was 54.7. He is on weekly prescription Vitamin D 50,000 IU.  Lab Results  Component Value Date   VD25OH 54.7 11/12/2021   VD25OH 49.8 06/17/2021   VD25OH 62.7 03/12/2021    Plan: Refill prescription vitamin D 50,000 IU weekly.    3. Obesity: Current BMI 41.3 [name] is currently in the action stage of change. As such, his goal is to continue with weight loss efforts.  He has agreed to Category 3 Plan with breakfast and lunch options. .  1.  Discussed importance of tight plan adherence, especially when nonweightbearing after surgery. 2.  Encouraged him to incorporate chair exercises.  Exercise goals: Chair exercises while nonweightbearing.  Behavioral modification strategies: increasing lean protein intake, decreasing simple carbohydrates, and planning for success.  [name] has agreed to follow-up with our clinic in 5 weeks.   No orders of the defined types were placed in this encounter.   Medications Discontinued During This  Encounter  Medication Reason   Vitamin D, Ergocalciferol, (DRISDOL) 1.25 MG (50000 UNIT) CAPS capsule Reorder     Meds ordered this encounter  Medications   Vitamin D, Ergocalciferol, (DRISDOL) 1.25 MG (50000 UNIT) CAPS capsule    Sig: Take 1 capsule (50,000 Units total) by mouth every 7 (seven) days.    Dispense:  4 capsule    Refill:  0    30 d supply;  ** OV for RF **   Do not  send RF request    Order Specific Question:   Supervising Provider    Answer:   Dell Ponto [[id number]]      Objective:   VITALS: Per patient if applicable, see vitals. GENERAL: Alert and in no acute distress. CARDIOPULMONARY: No increased WOB. Speaking in clear sentences.  PSYCH: Pleasant and cooperative. Speech normal rate and rhythm. Affect is appropriate. Insight and judgement are appropriate. Attention is focused, linear, and appropriate.  NEURO: Oriented as arrived to appointment on time with no prompting.   Lab Results  Component Value Date   CREATININE 0.94 07/02/2022   BUN 21 07/02/2022   NA 135 07/02/2022   K 4.0 07/02/2022   CL 100 07/02/2022   CO2 25 07/02/2022   Lab Results  Component Value Date   ALT 20 07/02/2022   AST 36 07/02/2022   ALKPHOS 56 07/02/2022   BILITOT 0.4 07/02/2022   Lab Results  Component Value Date   HGBA1C 7.6 (H) 02/17/2022   HGBA1C 7.1 (H) 11/12/2021   HGBA1C 6.3 (H) 06/17/2021   HGBA1C 6.5 (H) 03/12/2021   HGBA1C 7.7 (H) 12/04/2020   Lab Results  Component Value Date   INSULIN 5.2 12/04/2020   Lab Results  Component Value Date   TSH 1.550 12/04/2020   Lab Results  Component Value Date   CHOL 154 07/02/2022   HDL 33.70 (L) 07/02/2022   LDLCALC 62 01/15/2022   LDLDIRECT 87.0 07/02/2022   TRIG 283.0 (H) 07/02/2022   CHOLHDL 5 07/02/2022   Lab Results  Component Value Date   WBC 9.0 07/03/2021   HGB 14.9 07/03/2021   HCT 45.5 07/03/2021   MCV 82.5 07/03/2021   PLT 251.0 07/03/2021   No results found for: "IRON", "TIBC", "FERRITIN" Lab Results  Component Value Date   VD25OH 54.7 11/12/2021   VD25OH 49.8 06/17/2021   VD25OH 62.7 03/12/2021    Attestation Statements:   Reviewed by clinician on day of visit: allergies, medications, problem list, medical history, surgical history, family history, social history, and previous encounter notes.

## 2022-07-20 ENCOUNTER — Encounter (INDEPENDENT_AMBULATORY_CARE_PROVIDER_SITE_OTHER): Payer: Self-pay | Admitting: Family Medicine

## 2022-07-20 ENCOUNTER — Telehealth (INDEPENDENT_AMBULATORY_CARE_PROVIDER_SITE_OTHER): Payer: Managed Care, Other (non HMO) | Admitting: Family Medicine

## 2022-07-20 DIAGNOSIS — E669 Obesity, unspecified: Secondary | ICD-10-CM | POA: Diagnosis not present

## 2022-07-20 DIAGNOSIS — E1142 Type 2 diabetes mellitus with diabetic polyneuropathy: Secondary | ICD-10-CM

## 2022-07-20 DIAGNOSIS — Z7985 Long-term (current) use of injectable non-insulin antidiabetic drugs: Secondary | ICD-10-CM

## 2022-07-20 DIAGNOSIS — Z7984 Long term (current) use of oral hypoglycemic drugs: Secondary | ICD-10-CM

## 2022-07-20 DIAGNOSIS — Z6841 Body Mass Index (BMI) 40.0 and over, adult: Secondary | ICD-10-CM

## 2022-07-20 DIAGNOSIS — E559 Vitamin D deficiency, unspecified: Secondary | ICD-10-CM

## 2022-07-20 DIAGNOSIS — Z794 Long term (current) use of insulin: Secondary | ICD-10-CM

## 2022-07-20 MED ORDER — VITAMIN D (ERGOCALCIFEROL) 1.25 MG (50000 UNIT) PO CAPS: 50000.0000 [IU] | ORAL_CAPSULE | ORAL | 0 refills | Status: DC

## 2022-07-21 ENCOUNTER — Other Ambulatory Visit (HOSPITAL_BASED_OUTPATIENT_CLINIC_OR_DEPARTMENT_OTHER): Payer: Self-pay

## 2022-07-26 ENCOUNTER — Encounter: Payer: Self-pay | Admitting: Internal Medicine

## 2022-07-27 ENCOUNTER — Ambulatory Visit: Payer: Managed Care, Other (non HMO) | Admitting: Internal Medicine

## 2022-07-27 ENCOUNTER — Encounter: Payer: Self-pay | Admitting: Internal Medicine

## 2022-07-27 VITALS — BP 102/67 | HR 64 | Temp 97.3°F | Resp 15 | Ht 68.0 in | Wt 277.0 lb

## 2022-07-27 DIAGNOSIS — D122 Benign neoplasm of ascending colon: Secondary | ICD-10-CM

## 2022-07-27 DIAGNOSIS — D123 Benign neoplasm of transverse colon: Secondary | ICD-10-CM

## 2022-07-27 DIAGNOSIS — D128 Benign neoplasm of rectum: Secondary | ICD-10-CM

## 2022-07-27 DIAGNOSIS — Z1211 Encounter for screening for malignant neoplasm of colon: Secondary | ICD-10-CM | POA: Diagnosis not present

## 2022-07-27 DIAGNOSIS — K620 Anal polyp: Secondary | ICD-10-CM

## 2022-07-27 DIAGNOSIS — D124 Benign neoplasm of descending colon: Secondary | ICD-10-CM | POA: Diagnosis not present

## 2022-07-27 MED ORDER — SODIUM CHLORIDE 0.9 % IV SOLN: 500.0000 mL | Freq: Once | INTRAVENOUS | Status: DC

## 2022-07-27 NOTE — Patient Instructions (Addendum)
RECOMMENDATIONS: - Patient has a contact number available for emergencies. The signs and symptoms of potential delayed complications were discussed with the patient. Return to normal activities tomorrow. Written discharge instructions were provided to the patient. - Resume previous diet. - Continue present medications. - Await pathology results. - Repeat colonoscopy is recommended. The colonoscopy date will be determined after pathology results from today's exam become available for review.   Recommendation:YOU HAD AN ENDOSCOPIC PROCEDURE TODAY AT [facility]:   Refer to the procedure report that was given to you for any specific questions about what was found during the examination.  If the procedure report does not answer your questions, please call your gastroenterologist to clarify.  If you requested that your care partner not be given the details of your procedure findings, then the procedure report has been included in a sealed envelope for you to review at your convenience later.  YOU SHOULD EXPECT: Some feelings of bloating in the abdomen. Passage of more gas than usual.  Walking can help get rid of the air that was put into your GI tract during the procedure and reduce the bloating. If you had a lower endoscopy (such as a colonoscopy or flexible sigmoidoscopy) you may notice spotting of blood in your stool or on the toilet paper. If you underwent a bowel prep for your procedure, you may not have a normal bowel movement for a few days.  Please Note:  You might notice some irritation and congestion in your nose or some drainage.  This is from the oxygen used during your procedure.  There is no need for concern and it should clear up in a day or so.  SYMPTOMS TO REPORT IMMEDIATELY:  Following lower endoscopy (colonoscopy or flexible sigmoidoscopy):  Excessive amounts of blood in the stool  Significant tenderness or worsening of abdominal pains  Swelling of the abdomen  that is new, acute  Fever of 100F or higher   For urgent or emergent issues, a gastroenterologist can be reached at any hour by calling [phone]. Do not use MyChart messaging for urgent concerns.    DIET:  We do recommend a small meal at first, but then you may proceed to your regular diet.  Drink plenty of fluids but you should avoid alcoholic beverages for 24 hours.  MEDICATIONS: Continue present medications.  Please see handouts given to you by your recovery nurse: polyps.  Thank you for allowing [place] to provide for your healthcare needs today.  ACTIVITY:  You should plan to take it easy for the rest of today and you should NOT DRIVE or use heavy machinery until tomorrow (because of the sedation medicines used during the test).    FOLLOW UP: Our staff will call the number listed on your records the next business day following your procedure.  We will call around 7:15- 8:00 am to check on you and address any questions or concerns that you may have regarding the information given to you following your procedure. If we do not reach you, we will leave a message.     If any biopsies were taken you will be contacted by phone or by letter within the next 1-3 weeks.  Please call us at [phone] if you have not heard about the biopsies in 3 weeks.    SIGNATURES/CONFIDENTIALITY: You and/or your care partner have signed paperwork which will be entered into your electronic medical record.  These signatures attest to the fact that that the information above  on your After Visit Summary has been reviewed and is understood.  Full responsibility of the confidentiality of this discharge information lies with you and/or your care-partner.I found and removed 6 small to medium polyps. All look benign. There were 2 anal polyps that I biopsied - doubt they need removal but if they do will require a surgeon to do.  I will let you know results and recommendations.  I appreciate the opportunity to  care for you. Gatha Mayer, MD, [facility]

## 2022-07-27 NOTE — Progress Notes (Signed)
[facility] History and Physical   Primary Care Physician:  [name], MD   Reason for Procedure:   CRCA screening  Plan:    colonoscopy     HPI: Brandon Rich is a 62 y.o. male  S/p negative screening colonoscopy 2005  Past Medical History:  Diagnosis Date   Acute combined systolic and diastolic heart failure ([place]) 02/16/2016   Allergy    Anxiety    Phreesia 12/27/2019   Anxiety    DDD (degenerative disc disease), cervical    Depression    Diabetes mellitus    Diabetes mellitus without complication ([place])    Phreesia 12/27/2019   Diabetic foot ulcer ([facility])    FUO (fever of unknown origin) 02/16/2016   Glaucoma    Heartburn    Hyperlipidemia    Hypertension    LVH (left ventricular hypertrophy) 03/29/2016   Palpitation    Rash and nonspecific skin eruption 02/16/2016   Sleep apnea    CPAP   SOBOE (shortness of breath on exertion)    Transaminitis 02/16/2016    Past Surgical History:  Procedure Laterality Date   AMPUTATION TOE Right 04/2020   AMPUTATION TOE Right 01/2020   HERNIA REPAIR     SPINE SURGERY     L4/L5   TUMOR REMOVAL  12/1998   Bone Cyst    Prior to Admission medications   Medication Sig Start Date End Date Taking? Authorizing Provider  Ascorbic Acid (VITAMIN C) 1000 MG tablet Take 1,000 mg by mouth daily. Patient not taking: Reported on 07/02/2022    [provider]  aspirin 81 MG tablet Take 81 mg by mouth daily.    [provider]  atorvastatin (LIPITOR) 20 MG tablet Take 1 tablet (20 mg total) by mouth daily. 07/02/22   [name], MD  BD PEN NEEDLE NANO U/F 32G X 4 MM MISC  07/04/18   [provider]  calcium carbonate (TUMS - DOSED IN MG ELEMENTAL CALCIUM) 500 MG chewable tablet Chew 2 tablets by mouth daily as needed for indigestion or heartburn.    [provider]  dorzolamide-timolol (COSOPT) 22.3-6.8 MG/ML ophthalmic solution 1 drop 2 (two) times daily.    [provider]  empagliflozin (JARDIANCE) 25 MG TABS tablet Take 25 mg by mouth daily. 12/16/16   [name], MD  fexofenadine (ALLEGRA) 180 MG tablet Take 180 mg by mouth daily.    [provider]  fluticasone (FLONASE) 50 MCG/ACT nasal spray PLACE 2 SPRAYS INTO BOTH NOSTRILS DAILY. 07/09/14   Orma Flaming, MD  gabapentin (NEURONTIN) 300 MG capsule Take 1 capsule (300 mg total) by mouth at bedtime. 07/02/22   [name], MD  hydrOXYzine (ATARAX) 25 MG tablet Take 0.5-1 tablets (12.5-25 mg total) by mouth at bedtime as needed for anxiety. 07/02/22   [name], MD  Insulin Detemir (LEVEMIR FLEXPEN [place]) Inject 60 Units into the skin daily.    [provider]  lisinopril (ZESTRIL) 40 MG tablet Take 1 tablet (40 mg total) by mouth daily. 07/03/21   [name], MD  metFORMIN (GLUCOPHAGE) 1000 MG tablet TAKE ONE TABLET BY MOUTH TWICE A DAY 04/09/22   [name], MD  Multiple Vitamin (MULTIVITAMIN) tablet Take 1 tablet by mouth every evening.     [provider]  sertraline (ZOLOFT) 100 MG tablet Take 1.5 tablets (150 mg total) by mouth daily. 07/02/22   [name], MD  sildenafil (VIAGRA) 100 MG  tablet Take 0.5-1 tablets (50-100 mg total) by mouth daily as needed for erectile dysfunction. 01/01/22   [name], MD  tirzepatide [facility]) 15 MG/0.5ML Pen Inject 15 mg Subcutaneously once a week 90 days 06/17/22     Travoprost, BAK Free, (TRAVATAN) 0.004 % SOLN ophthalmic solution SMARTSIG:In Eye(s) 01/28/21   [provider]  Vitamin D, Ergocalciferol, (DRISDOL) 1.25 MG (50000 UNIT) CAPS capsule Take 1 capsule (50,000 Units total) by mouth every 7 (seven) days. 07/20/22   [place], Joneen Boers, FNP    Current Outpatient Medications  Medication Sig Dispense Refill   Ascorbic Acid (VITAMIN C) 1000 MG tablet Take 1,000 mg by mouth daily. (Patient not taking: Reported on 07/02/2022)     aspirin 81 MG tablet Take 81 mg by mouth daily.      atorvastatin (LIPITOR) 20 MG tablet Take 1 tablet (20 mg total) by mouth daily. 90 tablet 1   BD PEN NEEDLE NANO U/F 32G X 4 MM MISC      calcium carbonate (TUMS - DOSED IN MG ELEMENTAL CALCIUM) 500 MG chewable tablet Chew 2 tablets by mouth daily as needed for indigestion or heartburn.     dorzolamide-timolol (COSOPT) 22.3-6.8 MG/ML ophthalmic solution 1 drop 2 (two) times daily.     empagliflozin (JARDIANCE) 25 MG TABS tablet Take 25 mg by mouth daily. 30 tablet 3   fexofenadine (ALLEGRA) 180 MG tablet Take 180 mg by mouth daily.     fluticasone (FLONASE) 50 MCG/ACT nasal spray PLACE 2 SPRAYS INTO BOTH NOSTRILS DAILY. 16 g 10   gabapentin (NEURONTIN) 300 MG capsule Take 1 capsule (300 mg total) by mouth at bedtime. 90 capsule 1   hydrOXYzine (ATARAX) 25 MG tablet Take 0.5-1 tablets (12.5-25 mg total) by mouth at bedtime as needed for anxiety. 90 tablet 1   Insulin Detemir (LEVEMIR FLEXPEN [place]) Inject 60 Units into the skin daily.     lisinopril (ZESTRIL) 40 MG tablet Take 1 tablet (40 mg total) by mouth daily. 90 tablet 3   metFORMIN (GLUCOPHAGE) 1000 MG tablet TAKE ONE TABLET BY MOUTH TWICE A DAY 180 tablet 1   Multiple Vitamin (MULTIVITAMIN) tablet Take 1 tablet by mouth every evening.      sertraline (ZOLOFT) 100 MG tablet Take 1.5 tablets (150 mg total) by mouth daily. 135 tablet 1   sildenafil (VIAGRA) 100 MG tablet Take 0.5-1 tablets (50-100 mg total) by mouth daily as needed for erectile dysfunction. 5 tablet 11   tirzepatide (MOUNJARO) 15 MG/0.5ML Pen Inject 15 mg Subcutaneously once a week 90 days 6 mL 4   Travoprost, BAK Free, (TRAVATAN) 0.004 % SOLN ophthalmic solution SMARTSIG:In Eye(s)     Vitamin D, Ergocalciferol, (DRISDOL) 1.25 MG (50000 UNIT) CAPS capsule Take 1 capsule (50,000 Units total) by mouth every 7 (seven) days. 4 capsule 0   Current Facility-Administered Medications  Medication Dose Route Frequency Provider Last Rate Last Admin   0.9 %  sodium chloride infusion   500 mL Intravenous Once Gatha Mayer, MD        Allergies as of 07/27/2022 - Review Complete 07/27/2022  Allergen Reaction Noted   Other Other (See Comments) 08/05/2019    Family History  Problem Relation Age of Onset   Skin cancer Mother    Cancer Mother    Heart disease Mother        Atrial Fib   Hypertension Mother    Hyperlipidemia Mother    Obesity Mother    Cancer Father  Heart disease Father        Valve Disease   Heart disease Maternal Grandmother    Hyperlipidemia Maternal Grandmother    Hypertension Maternal Grandmother    Heart disease Maternal Grandfather    Heart disease Paternal Grandfather    Hypertension Paternal Grandfather    Hyperlipidemia Paternal Grandfather    Sleep apnea Neg Hx    Colon cancer Neg Hx    Esophageal cancer Neg Hx    Stomach cancer Neg Hx     Social History   Socioeconomic History   Marital status: Married    Spouse name: Jamison Soward   Number of children: 2   Years of education: 16   Highest education level: Not on file  Occupational History   Occupation: Tour manager  Tobacco Use   Smoking status: Never   Smokeless tobacco: Never  Vaping Use   Vaping Use: Never used  Substance and Sexual Activity   Alcohol use: No    Alcohol/week: 0.0 standard drinks of alcohol    Comment: per pt 1 drink once in a long while   Drug use: No   Sexual activity: Yes    Partners: Female  Other Topics Concern   Not on file  Social History Narrative   Married. Education: [employer].    Social Determinants of Health   Financial Resource Strain: Not on file  Food Insecurity: Not on file  Transportation Needs: Not on file  Physical Activity: Not on file  Stress: Not on file  Social Connections: Not on file  Intimate Partner Violence: Not on file    Review of Systems:  All other review of systems negative except as mentioned in the HPI.  Physical Exam: Vital signs BP 117/67   Pulse 74   Temp (!) 97.3 F (36.3 C)  (Temporal)   Ht '5\' 8"'$  (1.727 m)   Wt 277 lb (125.6 kg)   SpO2 97%   BMI 42.12 kg/m   General:   Alert,  Well-developed, well-nourished, pleasant and cooperative in NAD Lungs:  Clear throughout to auscultation.   Heart:  Regular rate and rhythm; no murmurs, clicks, rubs,  or gallops. Abdomen:  Soft, nontender and nondistended. Normal bowel sounds.   Neuro/Psych:  Alert and cooperative. Normal mood and affect. A and O x 3   '@Ay[facility] Record'$  Simonne Maffucci, MD, [facility] [phone] (pager) 07/27/2022 7:58 AM@

## 2022-07-27 NOTE — Op Note (Signed)
[facility] Patient Name: Brandon Rich Procedure Date: 07/27/2022 8:39 AM MRN: [id number] Endoscopist: Gatha Mayer , MD, [id number] Age: 62 Referring MD:  Date of Birth: Sep 16, 1960 Gender: Male Account #: [id number] Procedure:                Colonoscopy Indications:              Screening for colorectal malignant neoplasm, Last                            colonoscopy: 2005 Medicines:                Monitored Anesthesia Care Procedure:                Pre-Anesthesia Assessment:                           - Prior to the procedure, a History and Physical                            was performed, and patient medications and                            allergies were reviewed. The patient's tolerance of                            previous anesthesia was also reviewed. The risks                            and benefits of the procedure and the sedation                            options and risks were discussed with the patient.                            All questions were answered, and informed consent                            was obtained. Prior Anticoagulants: The patient has                            taken no anticoagulant or antiplatelet agents. ASA                            Grade Assessment: III - A patient with severe                            systemic disease. After reviewing the risks and                            benefits, the patient was deemed in satisfactory                            condition to undergo the procedure.  After obtaining informed consent, the colonoscope                            was passed under direct vision. Throughout the                            procedure, the patient's blood pressure, pulse, and                            oxygen saturations were monitored continuously. The                            CF [id number] #[id number] was introduced through the anus                            and advanced to the the cecum,  identified by                            appendiceal orifice and ileocecal valve. The                            colonoscopy was performed without difficulty. The                            patient tolerated the procedure well. The quality                            of the bowel preparation was adequate. The                            ileocecal valve, appendiceal orifice, and rectum                            were photographed. The bowel preparation used was                            Miralax via split dose instruction. Scope In: 8:48:29 AM Scope Out: 9:17:47 AM Total Procedure Duration: 0 hours 29 minutes 18 seconds  Findings:                 The perianal and digital rectal examinations were                            normal.                           Six sessile polyps were found in the rectum,                            descending colon, transverse colon and ascending                            colon. The polyps were 2 to 10 mm in size. These  polyps were removed with a cold snare. Resection                            and retrieval were complete. Verification of                            patient identification for the specimen was done.                            Estimated blood loss was minimal.                           Two semi-pedunculated polyps were found in the                            anus. The polyps were 5 to 8 mm in size. Biopsies                            were taken with a cold forceps for histology.                            Verification of patient identification for the                            specimen was done. Estimated blood loss was minimal.                           Anal papilla(e) were hypertrophied.                           The exam was otherwise without abnormality on                            direct and retroflexion views. Complications:            No immediate complications. Estimated Blood Loss:     Estimated blood loss was  minimal. Impression:               - Six 2 to 10 mm polyps in the rectum, in the                            descending colon, in the transverse colon and in                            the ascending colon, removed with a cold snare.                            Resected and retrieved.                           - Two 5 to 8 mm polyps at the anus. Biopsied.                           - Anal papilla(e) were hypertrophied.                           -  The examination was otherwise normal on direct                            and retroflexion views. Recommendation:           - Patient has a contact number available for                            emergencies. The signs and symptoms of potential                            delayed complications were discussed with the                            patient. Return to normal activities tomorrow.                            Written discharge instructions were provided to the                            patient.                           - Resume previous diet.                           - Continue present medications.                           - Await pathology results.                           - Repeat colonoscopy is recommended. The                            colonoscopy date will be determined after pathology                            results from today's exam become available for                            review. Gatha Mayer, MD 07/27/2022 9:32:12 AM This report has been signed electronically.

## 2022-07-27 NOTE — Progress Notes (Signed)
Sedate, gd SR, tolerated procedure well, VSS, report to RN 

## 2022-07-28 ENCOUNTER — Telehealth: Payer: Self-pay

## 2022-07-28 NOTE — Telephone Encounter (Signed)
Follow up call placed, VM obtained and message left. 

## 2022-08-09 ENCOUNTER — Encounter: Payer: Self-pay | Admitting: Internal Medicine

## 2022-08-09 DIAGNOSIS — Z86010 Personal history of colonic polyps: Secondary | ICD-10-CM | POA: Insufficient documentation

## 2022-08-09 DIAGNOSIS — Z860101 Personal history of adenomatous and serrated colon polyps: Secondary | ICD-10-CM

## 2022-08-09 HISTORY — DX: Personal history of adenomatous and serrated colon polyps: Z86.0101

## 2022-08-16 ENCOUNTER — Encounter (INDEPENDENT_AMBULATORY_CARE_PROVIDER_SITE_OTHER): Payer: Self-pay | Admitting: Family Medicine

## 2022-08-16 ENCOUNTER — Telehealth (INDEPENDENT_AMBULATORY_CARE_PROVIDER_SITE_OTHER): Payer: Managed Care, Other (non HMO) | Admitting: Family Medicine

## 2022-08-16 VITALS — Wt 278.0 lb

## 2022-08-16 DIAGNOSIS — E559 Vitamin D deficiency, unspecified: Secondary | ICD-10-CM | POA: Diagnosis not present

## 2022-08-16 DIAGNOSIS — E1169 Type 2 diabetes mellitus with other specified complication: Secondary | ICD-10-CM | POA: Diagnosis not present

## 2022-08-16 DIAGNOSIS — Z7985 Long-term (current) use of injectable non-insulin antidiabetic drugs: Secondary | ICD-10-CM

## 2022-08-16 DIAGNOSIS — Z6841 Body Mass Index (BMI) 40.0 and over, adult: Secondary | ICD-10-CM

## 2022-08-16 DIAGNOSIS — E669 Obesity, unspecified: Secondary | ICD-10-CM

## 2022-08-16 MED ORDER — VITAMIN D (ERGOCALCIFEROL) 1.25 MG (50000 UNIT) PO CAPS: 50000.0000 [IU] | ORAL_CAPSULE | ORAL | 0 refills | Status: DC

## 2022-08-22 ENCOUNTER — Other Ambulatory Visit: Payer: Self-pay | Admitting: Family Medicine

## 2022-08-22 DIAGNOSIS — I10 Essential (primary) hypertension: Secondary | ICD-10-CM

## 2022-09-06 NOTE — Progress Notes (Unsigned)
TeleHealth Visit:  Due to the COVID-19 pandemic, this visit was completed with telemedicine (audio/video) technology to reduce patient and provider exposure as well as to preserve personal protective equipment.   Trayson has verbally consented to this TeleHealth visit. The patient is located at home, the provider is located at the [facility] office. The participants in this visit include the listed provider and patient. The visit was conducted today via MyChart video.   Chief Complaint: OBESITY Haji is here to discuss his progress with his obesity treatment plan along with follow-up of his obesity related diagnoses. Jamonte is on the Category 3 Plan with breakfast and lunch options and states he is following his eating plan approximately 95% of the time. Ziven states he is not currently exercising.  Today's visit was #: 28 Starting weight: 302 lbs Starting date: 12/04/2020  Interim History: Kayleb had foot surgery on 08/04/2022. Back to eating normal foods. No nausea or vomiting, and no constipation. Off pain medications, and only using Tylenol now.   Subjective:   1. Type 2 diabetes mellitus with obesity ([place]) Derrious's last 7 days blood sugar average at 150 or so. Fasting blood sugars this morning was 120. Took Mounjaro the day after symptoms. Sees Dr. Buddy Duty, and last A1c was 7.8 he thinks before Thanksgiving. Still on insulin, Mounjaro, Jardiance, and metformin.  2. Vitamin D deficiency He is currently taking prescription vitamin D 50,000 IU each week. He denies nausea, vomiting or muscle weakness. Last Vit D level was 54.7 on 11/12/2021. Continue to check more than every 3 months due to insurance.   Assessment/Plan:   Orders Placed This Encounter  Procedures   Comprehensive metabolic panel   Hemoglobin A1c   Insulin, random   Lipid Panel With LDL/HDL Ratio   VITAMIN D 25 Hydroxy (Vit-D Deficiency, Fractures)    Medications Discontinued During This  Encounter  Medication Reason   Vitamin D, Ergocalciferol, (DRISDOL) 1.25 MG (50000 UNIT) CAPS capsule Reorder     Meds ordered this encounter  Medications   Vitamin D, Ergocalciferol, (DRISDOL) 1.25 MG (50000 UNIT) CAPS capsule    Sig: Take 1 capsule (50,000 Units total) by mouth every 7 (seven) days.    Dispense:  4 capsule    Refill:  0    30 d supply;  ** OV for RF **   Do not send RF request     1. Type 2 diabetes mellitus with obesity ([facility]) Continue 15 mg of Mounjaro, no need for a refill at this time. Continue to ween insulin as needed. Recheck A1c when back in the office after Dawn's visit.    2. Vitamin D deficiency Low Vitamin D level contributes to fatigue and are associated with obesity, breast, and colon cancer. He agrees to continue to take prescription Vitamin D 50,000 IU every week and we will refill for 1 month. He will follow-up for routine testing of Vitamin D, at least 2-3 times per year to avoid over-replacement. We will recheck labs at his next in office visit.   - Vitamin D, Ergocalciferol, (DRISDOL) 1.25 MG (50000 UNIT) CAPS capsule; Take 1 capsule (50,000 Units total) by mouth every 7 (seven) days.  Dispense: 4 capsule; Refill: 0  3. Obesity, Current BMI 41.3 Abb is currently in the action stage of change. As such, his goal is to continue with weight loss efforts. He has agreed to the Category 3 Plan with breakfast and lunch options.   We will recheck fasting labs at  his appointment in 8 weeks. Focus on healthy eating and great control of blood sugars for good wound healing.   Exercise goals: As is, per Podiatry/Ortho.   Behavioral modification strategies: increasing lean protein intake and decreasing simple carbohydrates.  Chane has agreed to follow-up with our clinic in 8 weeks. He was informed of the importance of frequent follow-up visits to maximize his success with intensive lifestyle modifications for his multiple health conditions.  Objective:    VITALS: Per patient if applicable, see vitals. GENERAL: Alert and in no acute distress. CARDIOPULMONARY: No increased WOB. Speaking in clear sentences.  PSYCH: Pleasant and cooperative. Speech normal rate and rhythm. Affect is appropriate. Insight and judgement are appropriate. Attention is focused, linear, and appropriate.  NEURO: Oriented as arrived to appointment on time with no prompting.   Lab Results  Component Value Date   CREATININE 0.94 07/02/2022   BUN 21 07/02/2022   NA 135 07/02/2022   K 4.0 07/02/2022   CL 100 07/02/2022   CO2 25 07/02/2022   Lab Results  Component Value Date   ALT 20 07/02/2022   AST 36 07/02/2022   ALKPHOS 56 07/02/2022   BILITOT 0.4 07/02/2022   Lab Results  Component Value Date   HGBA1C 7.6 (H) 02/17/2022   HGBA1C 7.1 (H) 11/12/2021   HGBA1C 6.3 (H) 06/17/2021   HGBA1C 6.5 (H) 03/12/2021   HGBA1C 7.7 (H) 12/04/2020   Lab Results  Component Value Date   INSULIN 5.2 12/04/2020   Lab Results  Component Value Date   TSH 1.550 12/04/2020   Lab Results  Component Value Date   CHOL 154 07/02/2022   HDL 33.70 (L) 07/02/2022   LDLCALC 62 01/15/2022   LDLDIRECT 87.0 07/02/2022   TRIG 283.0 (H) 07/02/2022   CHOLHDL 5 07/02/2022   Lab Results  Component Value Date   VD25OH 54.7 11/12/2021   VD25OH 49.8 06/17/2021   VD25OH 62.7 03/12/2021   Lab Results  Component Value Date   WBC 9.0 07/03/2021   HGB 14.9 07/03/2021   HCT 45.5 07/03/2021   MCV 82.5 07/03/2021   PLT 251.0 07/03/2021   No results found for: "IRON", "TIBC", "FERRITIN"  Attestation Statements:   Reviewed by clinician on day of visit: allergies, medications, problem list, medical history, surgical history, family history, social history, and previous encounter notes.   Wilhemena Durie, am acting as transcriptionist for [employer], DO.  I have reviewed the above documentation for accuracy and completeness, and I agree with the above. Marjory Sneddon,  D.O.  The [place] was signed into law in 2016 which includes the topic of electronic health records.  This provides immediate access to information in MyChart.  This includes consultation notes, operative notes, office notes, lab results and pathology reports.  If you have any questions about what you read please let us know at your next visit so we can discuss your concerns and take corrective action if need be.  We are right here with you.

## 2022-09-09 ENCOUNTER — Other Ambulatory Visit (HOSPITAL_BASED_OUTPATIENT_CLINIC_OR_DEPARTMENT_OTHER): Payer: Self-pay

## 2022-09-09 NOTE — Progress Notes (Addendum)
TeleHealth Visit:  This visit was completed with telemedicine (audio/video) technology. [name] has verbally consented to this TeleHealth visit. The patient is located at home, the provider is located at home. The participants in this visit include the listed provider and patient. The visit was conducted today via MyChart video.  OBESITY Brandon Rich is here to discuss his progress with his obesity treatment plan along with follow-up of his obesity related diagnoses.   Today's visit was # 29 Starting weight: 302 lbs Starting date: 12/04/2020 Weight at last in office visit: 271 lbs on 06/22/22 Total weight loss: 31 lbs at last in office visit on 06/22/22. Today's reported weight:  No weight reported.  Nutrition Plan: the Category 3 plan and with breakfast and lunch options.  Current exercise:  none. Had foot surgery 08/04/21.  Interim History:  He had foot surgery 6 weeks ago. One of the wounds hasn't totally healed but is improving. Some pain-taking gabapentin and Tylenol. He can now bear weight on his heel but not the toes. He has been cleared to drive.  First several weeks after surgery adhered to plan well because he couldn't walk to the kitchen for extra snacks. Now he is more mobile and snacking more.  His wife has been picking up carry out quite a bit but he is making good choices. Wife is a patient but is not adhering to plan well at all, purchases snacks like cookies. Protein intake is good- he always focuses on this.   Assessment/Plan:  1. 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 Last A1c was 7.9 on 06/20/22 at [facility].  CBGs: Average 150-160. Has CGM- Freestyle Libre 3. Episodes of hypoglycemia: yes - a few times per week for last few weeks- 65-70 during night. No symptoms. Uses glucose tablets for hypoglycemia.  CGM alarm wakes him up and then sometimes he is up for 2 hours because he cannot go back to  sleep. Medication(s): Mounjaro 15 mg weekly, levemir 60 units in am, Jardiance 25 mg, metformin 100 mg twice daily. Good appetite suppression from [facility], still has cravings.  Lab Results  Component Value Date   HGBA1C 7.6 (H) 02/17/2022   HGBA1C 7.1 (H) 11/12/2021   HGBA1C 6.3 (H) 06/17/2021   Lab Results  Component Value Date   MICROALBUR 5.4 (H) 07/02/2022   LDLCALC 62 01/15/2022   CREATININE 0.94 07/02/2022   Lab Results  Component Value Date   GFR 86.88 07/02/2022    Plan: Send message to Dr. Buddy Duty if low CBGs continue.  Continue all meds at current dosages. Follow up in Dr. Buddy Duty in 1-2 months.  Reduce simple carbs. Discussed positive effects that losing 50 more pounds would have on his diabetes.  2. Vitamin D Deficiency Vitamin D is at goal of 50.  Most recent vitamin D level was 54.7 on 11/12/2021.Marland Kitchen He is on  prescription ergocalciferol 50,000 IU weekly. Lab Results  Component Value Date   VD25OH 54.7 11/12/2021   VD25OH 49.8 06/17/2021   VD25OH 62.7 03/12/2021    Plan: Refill prescription vitamin D 50,000 IU weekly.   3. Morbid Obesity: Current BMI 41.2 1. Discussed the importance of good plan adherence. 2.  He will adhere to plan 85-90%. 3.  Will have wife put snacks away where they are not tempting to him.  [name] is currently in the action stage of change. As such, his goal is to continue with weight loss efforts.  He has agreed to the  Category 4 plan and with breakfast and lunch options.  Exercise goals: No exercise has been prescribed at this time.  Behavioral modification strategies: decreasing simple carbohydrates , decrease eating out, better snacking choices, planning for success, dealing with family or coworker sabotage, and decrease snacking .  Issack has agreed to follow-up with our clinic in 4 weeks.   No orders of the defined types were placed in this encounter.   Medications Discontinued During This Encounter  Medication Reason    Vitamin D, Ergocalciferol, (DRISDOL) 1.25 MG (50000 UNIT) CAPS capsule Reorder   Ascorbic Acid (VITAMIN C) 1000 MG tablet Patient Preference     Meds ordered this encounter  Medications   Vitamin D, Ergocalciferol, (DRISDOL) 1.25 MG (50000 UNIT) CAPS capsule    Sig: Take 1 capsule (50,000 Units total) by mouth every 7 (seven) days.    Dispense:  4 capsule    Refill:  0    30 d supply;  ** OV for RF **   Do not send RF request    Order Specific Question:   Supervising Provider    Answer:   Dell Ponto [[id number]]      Objective:   VITALS: Per patient if applicable, see vitals. GENERAL: Alert and in no acute distress. CARDIOPULMONARY: No increased WOB. Speaking in clear sentences.  PSYCH: Pleasant and cooperative. Speech normal rate and rhythm. Affect is appropriate. Insight and judgement are appropriate. Attention is focused, linear, and appropriate.  NEURO: Oriented as arrived to appointment on time with no prompting.   Attestation Statements:   Reviewed by clinician on day of visit: allergies, medications, problem list, medical history, surgical history, family history, social history, and previous encounter notes.   This was prepared with the assistance of Presenter, broadcasting.  Occasional wrong-word or sound-a-like substitutions may have occurred due to the inherent limitations of voice recognition software.

## 2022-09-13 ENCOUNTER — Encounter (INDEPENDENT_AMBULATORY_CARE_PROVIDER_SITE_OTHER): Payer: Self-pay | Admitting: Family Medicine

## 2022-09-13 ENCOUNTER — Telehealth (INDEPENDENT_AMBULATORY_CARE_PROVIDER_SITE_OTHER): Payer: Managed Care, Other (non HMO) | Admitting: Family Medicine

## 2022-09-13 DIAGNOSIS — E559 Vitamin D deficiency, unspecified: Secondary | ICD-10-CM | POA: Diagnosis not present

## 2022-09-13 DIAGNOSIS — Z7985 Long-term (current) use of injectable non-insulin antidiabetic drugs: Secondary | ICD-10-CM

## 2022-09-13 DIAGNOSIS — E1142 Type 2 diabetes mellitus with diabetic polyneuropathy: Secondary | ICD-10-CM | POA: Diagnosis not present

## 2022-09-13 DIAGNOSIS — Z794 Long term (current) use of insulin: Secondary | ICD-10-CM

## 2022-09-13 DIAGNOSIS — Z6841 Body Mass Index (BMI) 40.0 and over, adult: Secondary | ICD-10-CM | POA: Diagnosis not present

## 2022-09-13 DIAGNOSIS — Z7984 Long term (current) use of oral hypoglycemic drugs: Secondary | ICD-10-CM

## 2022-09-13 MED ORDER — VITAMIN D (ERGOCALCIFEROL) 1.25 MG (50000 UNIT) PO CAPS: 50000.0000 [IU] | ORAL_CAPSULE | ORAL | 0 refills | Status: AC

## 2022-09-24 ENCOUNTER — Telehealth: Payer: Self-pay | Admitting: Pharmacist

## 2022-09-24 NOTE — Telephone Encounter (Signed)
Patient was on list for taking Levemir.  Due to [place] constraints, formulary losses impacting patient access, and the availability of alternative options, Novo Nordisk will be discontinuing Levemir in the [place]   Patient sees Dr Delrae Rend at [facility]. Noted that Levemir had been changed to [facility] / insulin glargine 07/2022.   Updated med list in Epic - no further action needed.

## 2022-10-07 ENCOUNTER — Other Ambulatory Visit (HOSPITAL_BASED_OUTPATIENT_CLINIC_OR_DEPARTMENT_OTHER): Payer: Self-pay

## 2022-10-14 ENCOUNTER — Ambulatory Visit (INDEPENDENT_AMBULATORY_CARE_PROVIDER_SITE_OTHER): Payer: Managed Care, Other (non HMO) | Admitting: Family Medicine

## 2022-10-14 ENCOUNTER — Other Ambulatory Visit: Payer: Self-pay

## 2022-10-20 ENCOUNTER — Other Ambulatory Visit (HOSPITAL_BASED_OUTPATIENT_CLINIC_OR_DEPARTMENT_OTHER): Payer: Self-pay

## 2022-12-11 ENCOUNTER — Other Ambulatory Visit: Payer: Self-pay | Admitting: Family Medicine

## 2022-12-11 DIAGNOSIS — E1142 Type 2 diabetes mellitus with diabetic polyneuropathy: Secondary | ICD-10-CM

## 2022-12-14 LAB — HM DIABETES EYE EXAM

## 2022-12-14 NOTE — Telephone Encounter (Signed)
Followed by endocrinology, gabapentin was discussed with me in December.  Controlled substance database reviewed.  Gabapentin 300 mg on 09/18/2022.  Refill ordered

## 2022-12-14 NOTE — Telephone Encounter (Signed)
Gabapentin 300 mg LOV: 07/02/22 Last Refill:07/02/22 Upcoming appt: 01/06/23

## 2022-12-23 ENCOUNTER — Ambulatory Visit: Payer: Managed Care, Other (non HMO) | Admitting: Family Medicine

## 2022-12-23 DIAGNOSIS — Z794 Long term (current) use of insulin: Secondary | ICD-10-CM

## 2022-12-23 DIAGNOSIS — E785 Hyperlipidemia, unspecified: Secondary | ICD-10-CM

## 2022-12-23 DIAGNOSIS — E119 Type 2 diabetes mellitus without complications: Secondary | ICD-10-CM

## 2022-12-23 DIAGNOSIS — F4323 Adjustment disorder with mixed anxiety and depressed mood: Secondary | ICD-10-CM

## 2022-12-23 DIAGNOSIS — F5104 Psychophysiologic insomnia: Secondary | ICD-10-CM | POA: Diagnosis not present

## 2022-12-23 DIAGNOSIS — F411 Generalized anxiety disorder: Secondary | ICD-10-CM | POA: Diagnosis not present

## 2022-12-23 DIAGNOSIS — I10 Essential (primary) hypertension: Secondary | ICD-10-CM

## 2022-12-23 DIAGNOSIS — N529 Male erectile dysfunction, unspecified: Secondary | ICD-10-CM

## 2022-12-23 MED ORDER — HYDROXYZINE HCL 25 MG PO TABS: 12.5000 mg | ORAL_TABLET | Freq: Every evening | ORAL | 1 refills | Status: DC | PRN

## 2022-12-23 MED ORDER — METFORMIN HCL 1000 MG PO TABS: ORAL_TABLET | ORAL | 1 refills | Status: DC

## 2022-12-23 MED ORDER — EMPAGLIFLOZIN 25 MG PO TABS: 25.0000 mg | ORAL_TABLET | Freq: Every day | ORAL | 3 refills | Status: DC

## 2022-12-23 MED ORDER — SERTRALINE HCL 100 MG PO TABS: 150.0000 mg | ORAL_TABLET | Freq: Every day | ORAL | 1 refills | Status: DC

## 2022-12-23 MED ORDER — SILDENAFIL CITRATE 100 MG PO TABS
50.0000 mg | ORAL_TABLET | Freq: Every day | ORAL | 11 refills | Status: DC | PRN
Start: 2022-12-23 — End: 2023-03-07

## 2022-12-23 MED ORDER — ATORVASTATIN CALCIUM 20 MG PO TABS: 20.0000 mg | ORAL_TABLET | Freq: Every day | ORAL | 1 refills | Status: DC

## 2022-12-23 NOTE — Patient Instructions (Addendum)
[facility] for hearing screen to start - let me know if you need other resources.  No medication changes today.  Fasting labs within the next week at the [facility] location below.  If any concerns on labs I will let you know.  Follow-up in 6 months for physical, let me know if there are concerns prior to that time.  Keep follow-up with your other specialists.  Happy Early [place] Ouch!  [facility] or xray: Walk in 8:30-4:30 during weekdays, no appointment needed [address]  [place], [place] [address]

## 2022-12-23 NOTE — Progress Notes (Signed)
Subjective:  Patient ID: Brandon Rich, male    DOB: 1961/06/29  Age: 62 y.o. MRN: [id number]  CC:  Chief Complaint  Patient presents with   Hyperlipidemia   Diabetes   Depression   Hearing Problem    Pt is requesting hearing testing, notes no particularly bad hearing loss but notes both parents use aids and wants to stay on top of those concerns     HPI Brandon Rich presents for   Follow-up chronic conditions and acute concern as above.  Hearing concern: Both parents with hearing aids. Denies acute changes or difficulty - wants to be aware of hearing. No recent testing - has [employer] - plans on screening there.   Diabetes: With diabetic polyneuropathy, on insulin, history of toe amputations, obesity, treated by endocrinology, Dr.Kerr with last office visit June 4. Unknown A1c.  Treated with Semglee insulin, gabapentin, metformin, Mounjaro (some availability issues), Jardiance, and he is on ACE inhibitor with lisinopril 40 mg daily, Lipitor for statin 20 mg daily. Gabapentin was increased to 300 mg at night at his December visit for improved control of symptoms with option of daytime or morning dose. Better with higher evening dose. Occasional daytime dosing - 300mg  - only about 5% of the time. Rare need. No side effects with higher dose.   Hypertension: With OSA on CPAP, working well, well rested.  Cardiologist Dr. Antoine Poche.  Lisinopril 40 mg daily without new side effects. Home readings: none BP Readings from Last 3 Encounters:  12/23/22 128/78  07/27/22 102/67  07/02/22 136/70   Lab Results  Component Value Date   CREATININE 0.94 07/02/2022   Anxiety with insomnia Treated with sertraline 150 mg daily, stable. Has therapist - Maxwell Marion few times per month. Going well. Hydroxyzine and gabapentin at bedtime to help with sleep - effective.     12/23/2022    9:19 AM 07/02/2022    8:07 AM 01/01/2022    8:49 AM 07/03/2021    8:00 AM 12/26/2020    8:41 AM   Depression screen PHQ 2/9  Decreased Interest 0 0 0 0 0  Down, Depressed, Hopeless 0 0 1 0 0  PHQ - 2 Score 0 0 1 0 0  Altered sleeping 1 1 1  0 1  Tired, decreased energy 1 1 0 0 0  Change in appetite 0 1 0 0 1  Feeling bad or failure about yourself  0 0 0 0 0  Trouble concentrating 0 0 0 0 0  Moving slowly or fidgety/restless 0 0 0 0 0  Suicidal thoughts 0 0 0 0 0  PHQ-9 Score 2 3 2  0 2  Difficult doing work/chores    Not difficult at all       12/23/2022    9:19 AM 01/01/2022    8:48 AM 12/26/2020    8:41 AM 06/29/2019    8:18 AM  GAD 7 : Generalized Anxiety Score  Nervous, Anxious, on Edge 0 1 0 0  Control/stop worrying 0 0 0 1  Worry too much - different things 0 1 0 1  Trouble relaxing 0 0 0 0  Restless 0 0 0 0  Easily annoyed or irritable 1 0 0 0  Afraid - awful might happen 0 0 0 0  Total GAD 7 Score 1 2 0 2    Hyperlipidemia: Lipitor 20 mg daily, no myalgias or side effects.  Last ate 1.5 hrs ago.   Lab Results  Component Value Date  CHOL 154 07/02/2022   HDL 33.70 (L) 07/02/2022   LDLCALC 62 01/15/2022   LDLDIRECT 87.0 07/02/2022   TRIG 283.0 (H) 07/02/2022   CHOLHDL 5 07/02/2022   Lab Results  Component Value Date   ALT 20 07/02/2022   AST 36 07/02/2022   ALKPHOS 56 07/02/2022   BILITOT 0.4 07/02/2022   ED: Viagra 100mg  working well generally.  Minimal HA and nasal congestion. No hearing or vision changes. No CP/dyspnea with exertion/intercourse.   History Patient Active Problem List   Diagnosis Date Noted   Class 3 severe obesity with serious comorbidity and body mass index (BMI) of 45.0 to 49.9 in adult [facility]) 08/16/2022   Hx of adenomatous colonic polyps 08/09/2022   Type 2 diabetes mellitus with obesity (HCC) 07/05/2022   At risk for hyperglycemia 03/15/2022   Polyphagia 08/26/2021   Nonalcoholic hepatosteatosis 12/22/2020   Other fatigue 12/04/2020   SOBOE (shortness of breath on exertion) 12/04/2020   Vitamin D deficiency 12/04/2020    Depression 12/04/2020   Class 3 severe obesity due to excess calories with serious comorbidity and body mass index (BMI) of 40.0 to 44.9 in adult (HCC) 09/10/2019   Chest discomfort 02/02/2019   Morbid obesity (HCC) 02/02/2019   Cortical age-related cataract of both eyes 03/30/2017   Nuclear sclerotic cataract of both eyes 03/30/2017   Primary open angle glaucoma (POAG) of both eyes, indeterminate stage 03/30/2017   Type 2 diabetes mellitus with foot ulcer, with long-term current use of insulin (HCC) 03/30/2017   LVH (left ventricular hypertrophy) 03/29/2016   T wave inversion in EKG 02/16/2016   Transaminitis 02/16/2016   Hammertoe of left foot 08/23/2013   Pain of left great toe 08/23/2013   Hypertension associated with type 2 diabetes mellitus (HCC) 12/20/2011   Diabetes mellitus (HCC) 12/20/2011   Hyperlipidemia associated with type 2 diabetes mellitus (HCC) 12/20/2011   Glaucoma (increased eye pressure) 07/19/1996   Past Medical History:  Diagnosis Date   Acute combined systolic and diastolic heart failure (HCC) 02/16/2016   Allergy    Anxiety    Phreesia 12/27/2019   Anxiety    DDD (degenerative disc disease), cervical    Depression    Diabetes mellitus    Diabetes mellitus without complication (HCC)    Phreesia 12/27/2019   Diabetic foot ulcer (HCC)    FUO (fever of unknown origin) 02/16/2016   Glaucoma    Heartburn    Hx of adenomatous colonic polyps 08/09/2022   5 adenomas max 10 mm recall 2027   Hyperlipidemia    Hypertension    Hypertrophied anal papilla    LVH (left ventricular hypertrophy) 03/29/2016   Palpitation    Rash and nonspecific skin eruption 02/16/2016   Sleep apnea    CPAP   SOBOE (shortness of breath on exertion)    Transaminitis 02/16/2016   Past Surgical History:  Procedure Laterality Date   AMPUTATION TOE Right 04/2020   AMPUTATION TOE Right 01/2020   HERNIA REPAIR     SPINE SURGERY     L4/L5   TUMOR REMOVAL  12/1998   Bone Cyst    Allergies  Allergen Reactions   Other Other (See Comments)    Hayfever   Prior to Admission medications   Medication Sig Start Date End Date Taking? Authorizing Provider  aspirin 81 MG tablet Take 81 mg by mouth daily.    [provider]  atorvastatin (LIPITOR) 20 MG tablet Take 1 tablet (20 mg total) by mouth daily. 07/02/22  Shade Flood, MD  BD PEN NEEDLE NANO U/F 32G X 4 MM MISC  07/04/18   [provider]  calcium carbonate (TUMS - DOSED IN MG ELEMENTAL CALCIUM) 500 MG chewable tablet Chew 2 tablets by mouth daily as needed for indigestion or heartburn.    [provider]  dorzolamide-timolol (COSOPT) 22.3-6.8 MG/ML ophthalmic solution 1 drop 2 (two) times daily.    [provider]  empagliflozin (JARDIANCE) 25 MG TABS tablet Take 25 mg by mouth daily. 12/16/16   Shade Flood, MD  fexofenadine (ALLEGRA) 180 MG tablet Take 180 mg by mouth daily.    [provider]  fluticasone (FLONASE) 50 MCG/ACT nasal spray PLACE 2 SPRAYS INTO BOTH NOSTRILS DAILY. 07/09/14   Jonita Albee, MD  gabapentin (NEURONTIN) 300 MG capsule TAKE ONE CAPSULE BY MOUTH AT BEDTIME 12/14/22   Shade Flood, MD  hydrOXYzine (ATARAX) 25 MG tablet Take 0.5-1 tablets (12.5-25 mg total) by mouth at bedtime as needed for anxiety. 07/02/22   Shade Flood, MD  insulin glargine-yfgn (SEMGLEE, YFGN,) 100 UNIT/ML Pen Inject 50 Units into the skin daily. (Titrate as directed up to max daily dose of 100 units) 07/20/22   [provider]  lisinopril (ZESTRIL) 40 MG tablet TAKE ONE TABLET BY MOUTH ONE TIME DAILY 08/23/22   Shade Flood, MD  metFORMIN (GLUCOPHAGE) 1000 MG tablet TAKE ONE TABLET BY MOUTH TWICE A DAY 04/09/22   Shade Flood, MD  Multiple Vitamin (MULTIVITAMIN) tablet Take 1 tablet by mouth every evening.     [provider]  sertraline (ZOLOFT) 100 MG tablet Take 1.5 tablets (150 mg total) by mouth daily. 07/02/22   Shade Flood, MD  sildenafil (VIAGRA) 100 MG tablet Take 0.5-1 tablets (50-100 mg total) by mouth daily as needed for erectile dysfunction. 01/01/22   Shade Flood, MD  tirzepatide [facility]) 15 MG/0.5ML Pen Inject 15 mg Subcutaneously once a week 90 days 06/17/22     Travoprost, BAK Free, (TRAVATAN) 0.004 % SOLN ophthalmic solution SMARTSIG:In Eye(s) 01/28/21   [provider]  Vitamin D, Ergocalciferol, (DRISDOL) 1.25 MG (50000 UNIT) CAPS capsule Take 1 capsule (50,000 Units total) by mouth every 7 (seven) days. 09/13/22   Whitmire, Thermon Leyland, FNP   Social History   Socioeconomic History   Marital status: Married    Spouse name: Arlee Silvestro   Number of children: 2   Years of education: 16   Highest education level: Bachelor's degree (e.g., BA, AB, BS)  Occupational History   Occupation: Education officer, museum  Tobacco Use   Smoking status: Never   Smokeless tobacco: Never  Vaping Use   Vaping Use: Never used  Substance and Sexual Activity   Alcohol use: No    Alcohol/week: 0.0 standard drinks of alcohol    Comment: per pt 1 drink once in a long while   Drug use: No   Sexual activity: Yes    Partners: Female  Other Topics Concern   Not on file  Social History Narrative   Married. Education: [employer].    Social Determinants of Health   Financial Resource Strain: Low Risk  (12/20/2022)   Overall Financial Resource Strain (CARDIA)    Difficulty of Paying Living Expenses: Not very hard  Food Insecurity: No Food Insecurity (12/20/2022)   Hunger Vital Sign    Worried About Running Out of Food in the Last Year: Never true    Ran Out of Food in the Last Year: Never  true  Transportation Needs: No Transportation Needs (12/20/2022)   PRAPARE - Administrator, Civil Service (Medical): No    Lack of Transportation (Non-Medical): No  Physical Activity: Insufficiently Active (12/20/2022)   Exercise Vital Sign    Days of Exercise per Week: 1 day    Minutes of Exercise per Session: 20  min  Stress: No Stress Concern Present (12/20/2022)   [employer] of Occupational Health - Occupational Stress Questionnaire    Feeling of Stress : Only a little  Social Connections: Moderately Isolated (12/20/2022)   Social Connection and Isolation Panel [NHANES]    Frequency of Communication with Friends and Family: Twice a week    Frequency of Social Gatherings with Friends and Family: Once a week    Attends Religious Services: Never    Database administrator or Organizations: No    Attends Engineer, structural: Not on file    Marital Status: Married  Catering manager Violence: Not on file    Review of Systems Per HPI.   Objective:   Vitals:   12/23/22 0920  BP: 128/78  Pulse: 76  Temp: 97.9 F (36.6 C)  TempSrc: Temporal  SpO2: 98%  Weight: 286 lb 6.4 oz (129.9 kg)  Height: 5\' 8"  (1.727 m)     Physical Exam Vitals reviewed.  Constitutional:      Appearance: He is well-developed.  HENT:     Head: Normocephalic and atraumatic.  Neck:     Vascular: No carotid bruit or JVD.  Cardiovascular:     Rate and Rhythm: Normal rate and regular rhythm.     Heart sounds: Normal heart sounds. No murmur heard. Pulmonary:     Effort: Pulmonary effort is normal.     Breath sounds: Normal breath sounds. No rales.  Musculoskeletal:     Right lower leg: No edema.     Left lower leg: No edema.  Skin:    General: Skin is warm and dry.  Neurological:     Mental Status: He is alert and oriented to person, place, and time.  Psychiatric:        Mood and Affect: Mood normal.        Assessment & Plan:  Brandon Rich is a 62 y.o. male . Adjustment disorder with mixed anxiety and depressed mood - Plan: hydrOXYzine (ATARAX) 25 MG tablet Psychophysiological insomnia - Plan: hydrOXYzine (ATARAX) 25 MG tablet Anxiety state - Plan: sertraline (ZOLOFT) 100 MG tablet  -Stable with counseling, and current medication regimen.  Continue Zoloft same dose, hydroxyzine as  needed for sleep.  Essential hypertension - Plan: atorvastatin (LIPITOR) 20 MG tablet, metFORMIN (GLUCOPHAGE) 1000 MG tablet  -Stable on current dose lisinopril, continue same.  Hyperlipidemia, unspecified hyperlipidemia type - Plan: atorvastatin (LIPITOR) 20 MG tablet  -Tolerating Lipitor, continue same.   Type 2 diabetes mellitus without complication, with long-term current use of insulin (HCC) - Plan: metFORMIN (GLUCOPHAGE) 1000 MG tablet, Comprehensive metabolic panel, Lipid panel Type 2 diabetes mellitus without complication, without long-term current use of insulin (HCC) - Plan: empagliflozin (JARDIANCE) 25 MG TABS tablet  -Few meds refilled as above but followed by endocrinology, continue routine follow-up.  Erectile dysfunction, unspecified erectile dysfunction type - Plan: sildenafil (VIAGRA) 100 MG tablet  -viagra Rx given - use lowest effective dose. Side effects discussed (including but not limited to headache/flushing, blue discoloration of vision, possible vascular steal and risk of cardiac effects if underlying unknown coronary artery disease, and permanent sensorineural hearing loss).  Understanding expressed.  Denies acute hearing changes but did want to have updated testing, recommended screening through [facility] initially but I am happy to refer to audiology if needed.  Meds ordered this encounter  Medications   hydrOXYzine (ATARAX) 25 MG tablet    Sig: Take 0.5-1 tablets (12.5-25 mg total) by mouth at bedtime as needed for anxiety.    Dispense:  90 tablet    Refill:  1   sertraline (ZOLOFT) 100 MG tablet    Sig: Take 1.5 tablets (150 mg total) by mouth daily.    Dispense:  135 tablet    Refill:  1   atorvastatin (LIPITOR) 20 MG tablet    Sig: Take 1 tablet (20 mg total) by mouth daily.    Dispense:  90 tablet    Refill:  1   metFORMIN (GLUCOPHAGE) 1000 MG tablet    Sig: TAKE ONE TABLET BY MOUTH TWICE A DAY    Dispense:  180 tablet    Refill:  1   empagliflozin  (JARDIANCE) 25 MG TABS tablet    Sig: Take 1 tablet (25 mg total) by mouth daily.    Dispense:  30 tablet    Refill:  3   sildenafil (VIAGRA) 100 MG tablet    Sig: Take 0.5-1 tablets (50-100 mg total) by mouth daily as needed for erectile dysfunction.    Dispense:  5 tablet    Refill:  11   Patient Instructions  [facility] for hearing screen to start - let me know if you need other resources.  No medication changes today.  Fasting labs within the next week at the [facility] location below.  If any concerns on labs I will let you know.  Follow-up in 6 months for physical, let me know if there are concerns prior to that time.  Keep follow-up with your other specialists.  Happy Early [place] Ouch!  [facility] or xray: Walk in 8:30-4:30 during weekdays, no appointment needed [address]  [place], [place] [address]       Signed,   Meredith Staggers, MD [facility], [facility] 12/23/22 10:18 AM

## 2022-12-26 ENCOUNTER — Encounter: Payer: Self-pay | Admitting: Family Medicine

## 2022-12-28 ENCOUNTER — Other Ambulatory Visit (INDEPENDENT_AMBULATORY_CARE_PROVIDER_SITE_OTHER): Payer: Managed Care, Other (non HMO)

## 2022-12-28 DIAGNOSIS — Z794 Long term (current) use of insulin: Secondary | ICD-10-CM | POA: Diagnosis not present

## 2022-12-28 DIAGNOSIS — E119 Type 2 diabetes mellitus without complications: Secondary | ICD-10-CM | POA: Diagnosis not present

## 2022-12-28 LAB — COMPREHENSIVE METABOLIC PANEL
ALT: 21 U/L (ref 0–53)
AST: 42 U/L — ABNORMAL HIGH (ref 0–37)
Albumin: 4.6 g/dL (ref 3.5–5.2)
Alkaline Phosphatase: 64 U/L (ref 39–117)
BUN: 17 mg/dL (ref 6–23)
CO2: 23 mEq/L (ref 19–32)
Calcium: 9.5 mg/dL (ref 8.4–10.5)
Chloride: 104 mEq/L (ref 96–112)
Creatinine, Ser: 0.92 mg/dL (ref 0.40–1.50)
GFR: 88.85 mL/min (ref >60.00)
Glucose, Bld: 148 mg/dL — ABNORMAL HIGH (ref 70–99)
Potassium: 3.9 mEq/L (ref 3.5–5.1)
Sodium: 137 mEq/L (ref 135–145)
Total Bilirubin: 0.4 mg/dL (ref 0.2–1.2)
Total Protein: 7.7 g/dL (ref 6.0–8.3)

## 2022-12-28 LAB — LIPID PANEL
Cholesterol: 115 mg/dL (ref 0–200)
HDL: 31.6 mg/dL — ABNORMAL LOW (ref >39.00)
LDL Cholesterol: 51 mg/dL (ref 0–99)
NonHDL: 83.58
Total CHOL/HDL Ratio: 4
Triglycerides: 161 mg/dL — ABNORMAL HIGH (ref 0.0–149.0)
VLDL: 32.2 mg/dL (ref 0.0–40.0)

## 2023-01-06 ENCOUNTER — Ambulatory Visit: Payer: Managed Care, Other (non HMO) | Admitting: Family Medicine

## 2023-01-17 ENCOUNTER — Ambulatory Visit (INDEPENDENT_AMBULATORY_CARE_PROVIDER_SITE_OTHER): Payer: Managed Care, Other (non HMO) | Admitting: Family Medicine

## 2023-01-17 ENCOUNTER — Encounter: Payer: Self-pay | Admitting: Family Medicine

## 2023-01-17 VITALS — BP 132/72 | HR 85 | Temp 98.5°F | Ht 68.0 in | Wt 288.5 lb

## 2023-01-17 DIAGNOSIS — J9589 Other postprocedural complications and disorders of respiratory system, not elsewhere classified: Secondary | ICD-10-CM | POA: Diagnosis not present

## 2023-01-17 MED ORDER — BENZONATATE 200 MG PO CAPS
200.0000 mg | ORAL_CAPSULE | Freq: Three times a day (TID) | ORAL | 0 refills | Status: DC | PRN
Start: 2023-01-17 — End: 2023-03-09

## 2023-01-17 NOTE — Progress Notes (Unsigned)
   Acute Office Visit   Subjective:  Patient ID: Brandon Rich, male    DOB: [birth year], 62 y.o.   MRN: [id number]  Chief Complaint  Patient presents with   Sore Throat    Pt is here today with C/O of sore throat, raspy voice and cough since surgery 2 weeks ago     HPI Patient is a 62 year old male that presents with a sore throat, raspy voice, hoarseness, and cough since surgery for a toe amputation. Reports mostly non-productive cough, but sometimes in the morning he will have a productive cough-off white, gray; thick. He reports he does wear a C-pap at night. Not had any problem with C-pap.  He reports had general anesthesia with intubation. He reports this is not his first intubation. Usually cough will resolve in a few days after surgery.  He reports he has used OTC vitamin C lounge that seemed to help.  Denies fever, chest pain, or shortness of breath.   Was taking Bactrim     ROS See HPI above      Objective:   BP 132/72   Pulse 85   Temp 98.5 F (36.9 C)   Ht 5\' 8"  (1.727 m)   Wt 288 lb 8 oz (130.9 kg)   SpO2 98%   BMI 43.87 kg/m  {Vitals History (Optional):23777}  Physical Exam  Results for orders placed or performed in visit on 01/17/23  HM DIABETES EYE EXAM  Result Value Ref Range   HM Diabetic Eye Exam No Retinopathy No Retinopathy        Assessment & Plan:  There are no diagnoses linked to this encounter.  No follow-ups on file.  Zandra Abts, NP

## 2023-01-17 NOTE — Patient Instructions (Addendum)
-  START Benzonatate 200mg  tablets, take 1 tablet every 8 hours as needed.  -Recommend warm salt water gargles.  -Throat lounges  -Chloraseptic spray  -Follow up if not improved.

## 2023-01-25 ENCOUNTER — Ambulatory Visit: Payer: Managed Care, Other (non HMO) | Admitting: Family Medicine

## 2023-01-26 ENCOUNTER — Ambulatory Visit
Admission: RE | Admit: 2023-01-26 | Discharge: 2023-01-26 | Disposition: A | Discharge status: Home / Self Care | Payer: Managed Care, Other (non HMO) | Source: Ambulatory Visit | Attending: Family Medicine | Admitting: Family Medicine

## 2023-01-26 ENCOUNTER — Telehealth: Payer: Self-pay

## 2023-01-26 ENCOUNTER — Ambulatory Visit: Payer: Managed Care, Other (non HMO) | Admitting: Family Medicine

## 2023-01-26 VITALS — BP 112/68 | HR 79 | Temp 98.7°F | Ht 68.0 in | Wt 293.0 lb

## 2023-01-26 DIAGNOSIS — J029 Acute pharyngitis, unspecified: Secondary | ICD-10-CM | POA: Diagnosis not present

## 2023-01-26 DIAGNOSIS — R051 Acute cough: Secondary | ICD-10-CM

## 2023-01-26 DIAGNOSIS — J9589 Other postprocedural complications and disorders of respiratory system, not elsewhere classified: Secondary | ICD-10-CM | POA: Diagnosis not present

## 2023-01-26 DIAGNOSIS — R062 Wheezing: Secondary | ICD-10-CM | POA: Diagnosis not present

## 2023-01-26 MED ORDER — PREDNISONE 10 MG PO TABS
ORAL_TABLET | ORAL | 0 refills | Status: AC
Start: 2023-01-26 — End: 2023-01-31

## 2023-01-26 NOTE — Telephone Encounter (Signed)
Pt seen results via my chart  

## 2023-01-26 NOTE — Patient Instructions (Signed)
-  Ordered a chest x-ray, please go to the following location to have it completed. Office will call with results and you may see them on MyChart.  [facility] or xray: Walk in 8:30-4:30 during weekdays, no appointment needed [address]  [place], [place] [address]  -Prescribed Prednisone 10mg , 6 day taper. Medication will cause your blood sugar to spike, recommend to increase water intake and decrease carbohydrates and sweets even more than what you usually eat.  -If not improved and there is no infection or concerns on chest x-ray, please call back to the office or send a message through MyChart, will place a referral to ENT.  -Follow up sooner or go the closes emergency department  if you develop chest pain, SHOB more than with coughing, or fever.

## 2023-01-26 NOTE — Progress Notes (Signed)
Acute Office Visit   Subjective:  Patient ID: Brandon Rich, male    DOB: May 22, 1961, 62 y.o.   MRN: [id number]  Chief Complaint  Patient presents with   Follow-up    Pt is here today with C/O cough, throat is very irritated. Pt was seen for this and his cough has become worse and long lasting. Pt reports he is used all RX and "this help just a little" Pt reports a wheezing Pt reports he was getting ready yesterday for his appt and his sugar dropped to 68 and had to call his wife. He was very sweaty . He just wanted to mention this cause he is unsure if all of this is related.    HPI Patient is a 62 year old male that presented with a sore throat, raspy voice, hoarseness and cough since surgery for a toe amputation (01/05/2023) on 01/17/2023. He was prescribed Benzonatate 200mg  tablets to take as needed for cough and recommended warm salt water gargles, throat lozenges, and chloraseptic spray. Prior to his appointment on 07/01, he had completed Bactrim prophylaxis for possible infection from toe amputation.   He reports his cough is still present, worse and long lasting, and throat is very irritated. He reports the medication helped a little. He reports his cough is more frequent, sometimes it is a productive cough-whitish, gray, thick and possibly green. He still wears his C-pap and immediately has to cough, that usually is the worse in the morning. Denies having a cough prior to surgery. He reports he has felt wheezing since his previous appointment. Denies fever and chest pain. He reports he does feel shortness of breath after having a coughing episode. Still a little hoarse, and sore throat in the morning.   He reports he has been taking OTC Robitussin cough capsules that have seemed to help some.   ROS See HPI above      Objective:   BP 112/68   Pulse 79   Temp 98.7 F (37.1 C)   Ht 5\' 8"  (1.727 m)   Wt 293 lb (132.9 kg)   SpO2 96%   BMI 44.55 kg/m    Physical  Exam Vitals reviewed.  Constitutional:      General: He is not in acute distress.    Appearance: Normal appearance. He is not ill-appearing, toxic-appearing or diaphoretic.  HENT:     Head: Normocephalic and atraumatic.     Mouth/Throat:     Pharynx: Oropharynx is clear. Uvula midline. No pharyngeal swelling, oropharyngeal exudate, posterior oropharyngeal erythema or uvula swelling.  Eyes:     General:        Right eye: No discharge.        Left eye: No discharge.     Conjunctiva/sclera: Conjunctivae normal.  Cardiovascular:     Rate and Rhythm: Normal rate and regular rhythm.     Heart sounds: Normal heart sounds. No murmur heard.    No friction rub. No gallop.  Pulmonary:     Effort: Pulmonary effort is normal. No respiratory distress.     Breath sounds: Wheezing (Upper posterior lobe) present.     Comments: Dry cough during visit  Musculoskeletal:        General: Normal range of motion.  Skin:    General: Skin is warm and dry.  Neurological:     General: No focal deficit present.     Mental Status: He is alert and oriented to person, place, and time. Mental status is at baseline.  Gait: Gait abnormal (Walking boot on left foot).  Psychiatric:        Mood and Affect: Mood normal.        Behavior: Behavior normal.        Thought Content: Thought content normal.        Judgment: Judgment normal.       Assessment & Plan:  Postoperative surgical complication involving respiratory system associated with non-respiratory system procedure, unspecified complication  Acute cough -     DG Chest 2 View; Future -     predniSONE; Take 6 tablets (60 mg total) by mouth daily with breakfast for 1 day, THEN 5 tablets (50 mg total) daily with breakfast for 1 day, THEN 4 tablets (40 mg total) daily with breakfast for 1 day, THEN 3 tablets (30 mg total) daily with breakfast for 1 day, THEN 2 tablets (20 mg total) daily with breakfast for 1 day, THEN 1 tablet (10 mg total) daily with  breakfast for 1 day.  Dispense: 21 tablet; Refill: 0  Wheezing -     DG Chest 2 View; Future -     predniSONE; Take 6 tablets (60 mg total) by mouth daily with breakfast for 1 day, THEN 5 tablets (50 mg total) daily with breakfast for 1 day, THEN 4 tablets (40 mg total) daily with breakfast for 1 day, THEN 3 tablets (30 mg total) daily with breakfast for 1 day, THEN 2 tablets (20 mg total) daily with breakfast for 1 day, THEN 1 tablet (10 mg total) daily with breakfast for 1 day.  Dispense: 21 tablet; Refill: 0  Sore throat -     predniSONE; Take 6 tablets (60 mg total) by mouth daily with breakfast for 1 day, THEN 5 tablets (50 mg total) daily with breakfast for 1 day, THEN 4 tablets (40 mg total) daily with breakfast for 1 day, THEN 3 tablets (30 mg total) daily with breakfast for 1 day, THEN 2 tablets (20 mg total) daily with breakfast for 1 day, THEN 1 tablet (10 mg total) daily with breakfast for 1 day.  Dispense: 21 tablet; Refill: 0  -Ordered a chest x-ray for continued cough and new onset of wheezing.  -Prescribed Prednisone 10mg , 6 day taper. Advised that medication will cause his blood sugar to spike, recommend to increase water intake and decrease carbohydrates and sweets even more than he usually eats.  -If not improved and there is no infection or concerns on chest x-ray, recommend to please call back to the office or send a message through MyChart, will place a referral to ENT.  -Follow up sooner or go the closes emergency department  if you develop chest pain, SHOB more than with coughing, or fever.    Zandra Abts, NP

## 2023-01-26 NOTE — Telephone Encounter (Signed)
-----   Message from Alveria Apley, NP sent at 01/26/2023 12:22 PM EDT ----- Chest x-ray is clear, no infection noted.

## 2023-01-31 ENCOUNTER — Ambulatory Visit: Payer: Managed Care, Other (non HMO) | Admitting: Family Medicine

## 2023-03-07 ENCOUNTER — Encounter: Payer: Self-pay | Admitting: Family Medicine

## 2023-03-07 ENCOUNTER — Other Ambulatory Visit: Payer: Self-pay | Admitting: Family Medicine

## 2023-03-07 DIAGNOSIS — N529 Male erectile dysfunction, unspecified: Secondary | ICD-10-CM

## 2023-03-09 ENCOUNTER — Encounter: Payer: Self-pay | Admitting: Family Medicine

## 2023-03-09 ENCOUNTER — Ambulatory Visit (INDEPENDENT_AMBULATORY_CARE_PROVIDER_SITE_OTHER): Payer: Managed Care, Other (non HMO) | Admitting: Family Medicine

## 2023-03-09 VITALS — BP 126/78 | HR 55 | Temp 98.5°F | Ht 68.0 in | Wt 295.8 lb

## 2023-03-09 DIAGNOSIS — R430 Anosmia: Secondary | ICD-10-CM | POA: Diagnosis not present

## 2023-03-09 DIAGNOSIS — H532 Diplopia: Secondary | ICD-10-CM | POA: Diagnosis not present

## 2023-03-09 LAB — POC COVID19 BINAXNOW: SARS Coronavirus 2 Ag: NEGATIVE

## 2023-03-09 NOTE — Progress Notes (Signed)
Subjective:  Patient ID: Brandon Rich, male    DOB: 1961-06-19  Age: 62 y.o. MRN: [id number]  CC:  Chief Complaint  Patient presents with   Loss of taste and smell    Pt notes started losing taste and smell about 2 weeks ago notes doesn't get any smell anymore couldn't smell rubbing alcohol, did at home COVID and they were all 3 negative, pt did have a episode of GI upset a few nights ago    HPI Brandon Rich presents for   Decrease in taste, smell: Noted past 10-14 days. Not tasting food, not able to smell anything - even rubbing alcohol. No known recent illness. Foot surgery in June, persistent thoat irritation after with cough. Treated by my colleague. Improved without prednisone, 90% better. Minimal cough. On bactim for prophylaxis for foot wound - off past few weeks.  No new illness. No noted nasal congestion, but raspy cough prior. Flonase daily for allergies.  Uses CPAP. Wife notes he sounds congested at night.  No face pain.  Minimal frontal HA- same time as loss of smell, taste. Mild, intermittent, tension feeling, not bad enough to take meds. No focal weakness, balance difficulty, slurred speech or facial droop.  No n/v, no vision changes, no diplopia.  3 negative covid tests few days apart, most recent 2 days.    History Patient Active Problem List   Diagnosis Date Noted   Class 3 severe obesity with serious comorbidity and body mass index (BMI) of 45.0 to 49.9 in adult [facility]) 08/16/2022   Hx of adenomatous colonic polyps 08/09/2022   Type 2 diabetes mellitus with obesity (HCC) 07/05/2022   At risk for hyperglycemia 03/15/2022   Polyphagia 08/26/2021   Acquired hallux rigidus of left foot 04/14/2021   Nonalcoholic hepatosteatosis 12/22/2020   Other fatigue 12/04/2020   SOBOE (shortness of breath on exertion) 12/04/2020   Vitamin D deficiency 12/04/2020   Depression 12/04/2020   Acquired claw toe of right foot 04/01/2020   Class 3 severe obesity due to excess  calories with serious comorbidity and body mass index (BMI) of 40.0 to 44.9 in adult (HCC) 09/10/2019   Chest discomfort 02/02/2019   Morbid obesity (HCC) 02/02/2019   Cortical age-related cataract of both eyes 03/30/2017   Nuclear sclerotic cataract of both eyes 03/30/2017   Primary open angle glaucoma (POAG) of both eyes, indeterminate stage 03/30/2017   Type 2 diabetes mellitus with foot ulcer, with long-term current use of insulin (HCC) 03/30/2017   LVH (left ventricular hypertrophy) 03/29/2016   T wave inversion in EKG 02/16/2016   Transaminitis 02/16/2016   Pain of left great toe 08/23/2013   Hypertension associated with type 2 diabetes mellitus (HCC) 12/20/2011   Diabetes mellitus (HCC) 12/20/2011   Hyperlipidemia associated with type 2 diabetes mellitus (HCC) 12/20/2011   Glaucoma (increased eye pressure) 07/19/1996   Past Medical History:  Diagnosis Date   Acute combined systolic and diastolic heart failure (HCC) 02/16/2016   Allergy    Anxiety    Phreesia 12/27/2019   Anxiety    DDD (degenerative disc disease), cervical    Depression    Diabetes mellitus    Diabetes mellitus without complication (HCC)    Phreesia 12/27/2019   Diabetic foot ulcer (HCC)    FUO (fever of unknown origin) 02/16/2016   Glaucoma    Heartburn    Hx of adenomatous colonic polyps 08/09/2022   5 adenomas max 10 mm recall 2027   Hyperlipidemia  Hypertension    Hypertrophied anal papilla    LVH (left ventricular hypertrophy) 03/29/2016   Palpitation    Rash and nonspecific skin eruption 02/16/2016   Sleep apnea    CPAP   SOBOE (shortness of breath on exertion)    Transaminitis 02/16/2016   Past Surgical History:  Procedure Laterality Date   AMPUTATION TOE Right 04/2020   AMPUTATION TOE Right 01/2020   AMPUTATION TOE Left    Big toe   HERNIA REPAIR     SPINE SURGERY     L4/L5   TUMOR REMOVAL  12/1998   Bone Cyst   Allergies  Allergen Reactions   Other Other (See Comments)     Hayfever   Prior to Admission medications   Medication Sig Start Date End Date Taking? Authorizing Provider  aspirin 81 MG tablet Take 81 mg by mouth daily.   Yes [provider]  atorvastatin (LIPITOR) 20 MG tablet Take 1 tablet (20 mg total) by mouth daily. 12/23/22  Yes Shade Flood, MD  BD PEN NEEDLE NANO U/F 32G X 4 MM MISC  07/04/18  Yes [provider]  calcium carbonate (TUMS - DOSED IN MG ELEMENTAL CALCIUM) 500 MG chewable tablet Chew 2 tablets by mouth daily as needed for indigestion or heartburn.   Yes [provider]  dorzolamide-timolol (COSOPT) 22.3-6.8 MG/ML ophthalmic solution 1 drop 2 (two) times daily.   Yes [provider]  empagliflozin (JARDIANCE) 25 MG TABS tablet Take 1 tablet (25 mg total) by mouth daily. 12/23/22  Yes Shade Flood, MD  fexofenadine (ALLEGRA) 180 MG tablet Take 180 mg by mouth daily.   Yes [provider]  fluticasone (FLONASE) 50 MCG/ACT nasal spray PLACE 2 SPRAYS INTO BOTH NOSTRILS DAILY. 07/09/14  Yes GuestAshley Jacobs, MD  gabapentin (NEURONTIN) 300 MG capsule TAKE ONE CAPSULE BY MOUTH AT BEDTIME 12/14/22  Yes Shade Flood, MD  hydrOXYzine (ATARAX) 25 MG tablet Take 0.5-1 tablets (12.5-25 mg total) by mouth at bedtime as needed for anxiety. 12/23/22  Yes Shade Flood, MD  insulin glargine-yfgn (SEMGLEE, YFGN,) 100 UNIT/ML Pen Inject 50 Units into the skin daily. (Titrate as directed up to max daily dose of 100 units) 07/20/22  Yes [provider]  lisinopril (ZESTRIL) 40 MG tablet TAKE ONE TABLET BY MOUTH ONE TIME DAILY 08/23/22  Yes Shade Flood, MD  metFORMIN (GLUCOPHAGE) 1000 MG tablet TAKE ONE TABLET BY MOUTH TWICE A DAY 12/23/22  Yes Shade Flood, MD  Multiple Vitamin (MULTIVITAMIN) tablet Take 1 tablet by mouth every evening.    Yes [provider]  sertraline (ZOLOFT) 100 MG tablet Take 1.5 tablets (150 mg total) by mouth daily. 12/23/22  Yes Shade Flood, MD   sildenafil (VIAGRA) 100 MG tablet TAKE ONE-HALF TO ONE TABLET BY MOUTH ONE TIME DAILY AS NEEDED FOR ERECTILE DYSFUNCTION 03/07/23  Yes Shade Flood, MD  tirzepatide [facility]) 15 MG/0.5ML Pen Inject 15 mg Subcutaneously once a week 90 days 06/17/22  Yes   Travoprost, BAK Free, (TRAVATAN) 0.004 % SOLN ophthalmic solution SMARTSIG:In Eye(s) 01/28/21  Yes [provider]  Vitamin D, Ergocalciferol, (DRISDOL) 1.25 MG (50000 UNIT) CAPS capsule Take 1 capsule (50,000 Units total) by mouth every 7 (seven) days. 09/13/22  Yes Whitmire, Thermon Leyland, FNP   Social History   Socioeconomic History   Marital status: Married    Spouse name: Brandon Rich   Number of children: 2   Years of education: 70  Highest education level: Bachelor's degree (e.g., BA, AB, BS)  Occupational History   Occupation: Education officer, museum  Tobacco Use   Smoking status: Never   Smokeless tobacco: Never  Vaping Use   Vaping status: Never Used  Substance and Sexual Activity   Alcohol use: No    Alcohol/week: 0.0 standard drinks of alcohol    Comment: per pt 1 drink once in a long while   Drug use: No   Sexual activity: Yes    Partners: Female  Other Topics Concern   Not on file  Social History Narrative   Married. Education: [employer].    Social Determinants of Health   Financial Resource Strain: Low Risk  (12/20/2022)   Overall Financial Resource Strain (CARDIA)    Difficulty of Paying Living Expenses: Not very hard  Food Insecurity: No Food Insecurity (12/20/2022)   Hunger Vital Sign    Worried About Running Out of Food in the Last Year: Never true    Ran Out of Food in the Last Year: Never true  Transportation Needs: No Transportation Needs (12/20/2022)   PRAPARE - Administrator, Civil Service (Medical): No    Lack of Transportation (Non-Medical): No  Physical Activity: Insufficiently Active (12/20/2022)   Exercise Vital Sign    Days of Exercise per Week: 1 day    Minutes of Exercise per  Session: 20 min  Stress: No Stress Concern Present (12/20/2022)   [employer] of Occupational Health - Occupational Stress Questionnaire    Feeling of Stress : Only a little  Social Connections: Moderately Isolated (12/20/2022)   Social Connection and Isolation Panel [NHANES]    Frequency of Communication with Friends and Family: Twice a week    Frequency of Social Gatherings with Friends and Family: Once a week    Attends Religious Services: Never    Database administrator or Organizations: No    Attends Engineer, structural: Not on file    Marital Status: Married  Catering manager Violence: Not on file    Review of Systems   Objective:   Vitals:   03/09/23 0911  BP: 126/78  Pulse: (!) 55  Temp: 98.5 F (36.9 C)  TempSrc: Temporal  SpO2: 98%  Weight: 295 lb 12.8 oz (134.2 kg)  Height: 5\' 8"  (1.727 m)     Physical Exam Vitals reviewed.  Constitutional:      Appearance: He is well-developed.  HENT:     Head: Normocephalic and atraumatic.     Right Ear: Tympanic membrane, ear canal and external ear normal.     Left Ear: Tympanic membrane, ear canal and external ear normal.     Nose: Nose normal. No congestion or rhinorrhea.     Mouth/Throat:     Mouth: Mucous membranes are moist.     Pharynx: Oropharynx is clear. No oropharyngeal exudate.  Eyes:     Comments: Reports diplopia with lateral gaze to the right and left beyond approximately  40 degrees from midline.  Also reports diplopia with vertical gaze above approximately 30-40 degrees from midline.  Neck:     Vascular: No carotid bruit or JVD.  Cardiovascular:     Rate and Rhythm: Normal rate and regular rhythm.     Heart sounds: Normal heart sounds. No murmur heard. Pulmonary:     Effort: Pulmonary effort is normal.     Breath sounds: Normal breath sounds. No rales.  Musculoskeletal:     Right lower leg: No edema.  Left lower leg: No edema.  Skin:    General: Skin is warm and dry.   Neurological:     Mental Status: He is alert and oriented to person, place, and time.     GCS: GCS eye subscore is 4. GCS verbal subscore is 5. GCS motor subscore is 6.     Cranial Nerves: Cranial nerve deficit (Lateral diplopia, see eye exam.) present. No facial asymmetry.     Sensory: Sensation is intact.     Motor: Motor function is intact. No weakness, tremor or pronator drift.     Coordination: Coordination is intact. Finger-Nose-Finger Test and Heel to [facility] Test normal. Rapid alternating movements normal.     Gait: Gait is intact.  Psychiatric:        Mood and Affect: Mood normal.     Results for orders placed or performed in visit on 03/09/23  POC COVID-19  Result Value Ref Range   SARS Coronavirus 2 Ag Negative Negative      Assessment & Plan:  Brandon Rich is a 62 y.o. male . Loss of smell - Plan: POC COVID-19, MR Brain W Wo Contrast, Ambulatory referral to ENT  Diplopia - Plan: MR Brain W Wo Contrast, Ambulatory referral to ENT Acute onset loss of smell, taste approximately 10 to 14 days ago.  Diplopia with lateral gaze.  Concerning for possible lateral rectus weakness, 6th nerve palsy but bilateral.  Also some diplopia with vertical gaze, which would be superior oblique or 4th nerve.  -Check imaging with MRI brain, refer to ENT.  ER precautions if any new or worsening symptoms in the interim.  No orders of the defined types were placed in this encounter.  There are no Patient Instructions on file for this visit.    Signed,   Meredith Staggers, MD [facility], [facility] 03/09/23 10:10 AM

## 2023-03-09 NOTE — Patient Instructions (Signed)
I will refer you to ear nose and throat and will check MRI of the brain as we discussed just to make sure there is no other specific cause for the loss of sense of smell and taste.  That should be performed in the next few days.  If needed I am happy to send in a medication to help with claustrophobia, let me know.  If any new or worsening symptoms be seen right away through the ER but I do not expect that to happen.  Take care and let me know if there are questions.

## 2023-03-10 ENCOUNTER — Ambulatory Visit (HOSPITAL_BASED_OUTPATIENT_CLINIC_OR_DEPARTMENT_OTHER)
Admission: RE | Admit: 2023-03-10 | Discharge: 2023-03-10 | Disposition: A | Discharge status: Home / Self Care | Payer: Managed Care, Other (non HMO) | Source: Ambulatory Visit | Attending: Family Medicine | Admitting: Family Medicine

## 2023-03-10 ENCOUNTER — Encounter: Payer: Self-pay | Admitting: Family Medicine

## 2023-03-10 DIAGNOSIS — I6782 Cerebral ischemia: Secondary | ICD-10-CM | POA: Diagnosis not present

## 2023-03-10 DIAGNOSIS — R439 Unspecified disturbances of smell and taste: Secondary | ICD-10-CM | POA: Insufficient documentation

## 2023-03-10 DIAGNOSIS — J323 Chronic sphenoidal sinusitis: Secondary | ICD-10-CM | POA: Insufficient documentation

## 2023-03-10 DIAGNOSIS — H532 Diplopia: Secondary | ICD-10-CM | POA: Insufficient documentation

## 2023-03-10 DIAGNOSIS — R430 Anosmia: Secondary | ICD-10-CM

## 2023-03-10 DIAGNOSIS — J329 Chronic sinusitis, unspecified: Secondary | ICD-10-CM

## 2023-03-10 DIAGNOSIS — F40240 Claustrophobia: Secondary | ICD-10-CM

## 2023-03-10 MED ORDER — DIAZEPAM 2 MG PO TABS
2.0000 mg | ORAL_TABLET | Freq: Once | ORAL | 0 refills | Status: AC
Start: 2023-03-10 — End: 2023-03-10

## 2023-03-10 MED ORDER — GADOBUTROL 1 MMOL/ML IV SOLN
10.0000 mL | Freq: Once | INTRAVENOUS | Status: AC | PRN
  Administered 2023-03-10: 10 mL via INTRAVENOUS
  Filled 2023-03-10: qty 10

## 2023-03-13 ENCOUNTER — Other Ambulatory Visit: Payer: Managed Care, Other (non HMO)

## 2023-03-14 MED ORDER — AMOXICILLIN-POT CLAVULANATE 875-125 MG PO TABS: 1.0000 | ORAL_TABLET | Freq: Two times a day (BID) | ORAL | 0 refills | Status: DC

## 2023-03-14 NOTE — Addendum Note (Signed)
Addended by: Meredith Staggers R on: 03/14/2023 05:16 PM   Modules accepted: Orders

## 2023-03-14 NOTE — Telephone Encounter (Signed)
Sounds good. Typically we use Augmentin for sinus infections, and will send in a 10 day supply. Typically that is well tolerated but sometimes see some diarrhea or stomach upset. Let me know how you are doing after the antibiotic, but please reach out sooner if needed. Take care.   Dr. Neva Seat

## 2023-03-18 ENCOUNTER — Emergency Department (HOSPITAL_COMMUNITY)
Admission: EM | Admit: 2023-03-18 | Discharge: 2023-03-18 | Disposition: A | Discharge status: Home / Self Care | Payer: Managed Care, Other (non HMO) | Attending: Emergency Medicine | Admitting: Emergency Medicine

## 2023-03-18 ENCOUNTER — Other Ambulatory Visit: Payer: Self-pay

## 2023-03-18 ENCOUNTER — Encounter (HOSPITAL_COMMUNITY): Payer: Self-pay

## 2023-03-18 DIAGNOSIS — Z20822 Contact with and (suspected) exposure to covid-19: Secondary | ICD-10-CM | POA: Diagnosis not present

## 2023-03-18 DIAGNOSIS — E162 Hypoglycemia, unspecified: Secondary | ICD-10-CM | POA: Diagnosis not present

## 2023-03-18 DIAGNOSIS — Z7982 Long term (current) use of aspirin: Secondary | ICD-10-CM | POA: Diagnosis not present

## 2023-03-18 LAB — BASIC METABOLIC PANEL
Anion gap: 17 — ABNORMAL HIGH (ref 5–15)
BUN: 18 mg/dL (ref 8–23)
CO2: 20 mmol/L — ABNORMAL LOW (ref 22–32)
Calcium: 9 mg/dL (ref 8.9–10.3)
Chloride: 98 mmol/L (ref 98–111)
Creatinine, Ser: 1.07 mg/dL (ref 0.61–1.24)
GFR, Estimated: >60 mL/min (ref >60)
Glucose, Bld: 138 mg/dL — ABNORMAL HIGH (ref 70–99)
Potassium: 3.6 mmol/L (ref 3.5–5.1)
Sodium: 135 mmol/L (ref 135–145)

## 2023-03-18 LAB — RESP PANEL BY RT-PCR (RSV, FLU A&B, COVID)  RVPGX2
Influenza A by PCR: NEGATIVE
Influenza B by PCR: NEGATIVE
Resp Syncytial Virus by PCR: NEGATIVE
SARS Coronavirus 2 by RT PCR: NEGATIVE

## 2023-03-18 LAB — CBC WITH DIFFERENTIAL/PLATELET
Abs Immature Granulocytes: 0.03 10*3/uL (ref 0.00–0.07)
Basophils Absolute: 0.1 10*3/uL (ref 0.0–0.1)
Basophils Relative: 1 %
Eosinophils Absolute: 0.3 10*3/uL (ref 0.0–0.5)
Eosinophils Relative: 3 %
HCT: 47.3 % (ref 39.0–52.0)
Hemoglobin: 15.2 g/dL (ref 13.0–17.0)
Immature Granulocytes: 0 %
Lymphocytes Relative: 11 %
Lymphs Abs: 1.1 10*3/uL (ref 0.7–4.0)
MCH: 26.9 pg (ref 26.0–34.0)
MCHC: 32.1 g/dL (ref 30.0–36.0)
MCV: 83.7 fL (ref 80.0–100.0)
Monocytes Absolute: 0.9 10*3/uL (ref 0.1–1.0)
Monocytes Relative: 8 %
Neutro Abs: 8.1 10*3/uL — ABNORMAL HIGH (ref 1.7–7.7)
Neutrophils Relative %: 77 %
Platelets: 238 10*3/uL (ref 150–400)
RBC: 5.65 MIL/uL (ref 4.22–5.81)
RDW: 15.2 % (ref 11.5–15.5)
WBC: 10.5 10*3/uL (ref 4.0–10.5)
nRBC: 0 % (ref 0.0–0.2)

## 2023-03-18 LAB — CBG MONITORING, ED
Glucose-Capillary: 138 mg/dL — ABNORMAL HIGH (ref 70–99)
Glucose-Capillary: 180 mg/dL — ABNORMAL HIGH (ref 70–99)

## 2023-03-18 NOTE — ED Triage Notes (Signed)
Pt BIB [facility] from home d/t hypoglycemia & he was very pale, cool, diaphoretic, weak & dizzy. He does have a monitor at home for his blood sugars & ate his usual type breakfast this morning & took his normal amt of 75 units long acting insulin & has not missed any of his recent AM/PM dose of Metformin. Also took some of his glucose tabs (unaware how many), ate a PBJ sandwich plus honey & his CBG was still 34 before calling EMS. FD gave 15g tab glucose as well & EMS also gave 15g more upon their arrival plus 1mg  Glucagon via IM & his CBG was 89 upon arrival to ED. Is A/Ox4, 124/68, 68 bpm, 97% RA, 12 L unremarkable. Only changes to his meds have been he did start ABT on the 25th.

## 2023-03-18 NOTE — ED Provider Notes (Signed)
[place] EMERGENCY DEPARTMENT AT [facility] Provider Note   CSN: [id number] Arrival date & time: 03/18/23  1324     History  Chief Complaint  Patient presents with   Hypoglycemia    Brandon Rich is a 62 y.o. male.  62 year old male with prior medical history as detailed below presents for evaluation.  Patient presents with EMS for reported hypoglycemia.  Patient was at home.  He experienced diaphoresis felt weak.  His sugars were as low as 34.  He reports that he had not improved prior to calling EMS with oral glucose.  EMS administered additional oral glucose and 1 mg glucagon IM.  Patient feels significant proved upon evaluation in the ED.  Patient reports that he is currently on antibiotic treatment for possible bacterial sinusitis.  He reports that he has been on antibiotics for about 2 days.  He reports congestion for the last 1 to 2 weeks.  His PCP started him on antibiotics out of concern for possible bacterial sinusitis.  He reports multiple negative COVID tests at home.  At time of evaluation, patient feels significant proved.  He is without specific complaint.  He denies recent change in diabetes medications.  He denies any specific change in his diet recently.  The history is provided by the patient and medical records.       Home Medications Prior to Admission medications   Medication Sig Start Date End Date Taking? Authorizing Provider  amoxicillin-clavulanate (AUGMENTIN) 875-125 MG tablet Take 1 tablet by mouth 2 (two) times daily. 03/14/23   Shade Flood, MD  aspirin 81 MG tablet Take 81 mg by mouth daily.    [provider]  atorvastatin (LIPITOR) 20 MG tablet Take 1 tablet (20 mg total) by mouth daily. 12/23/22   Shade Flood, MD  BD PEN NEEDLE NANO U/F 32G X 4 MM MISC  07/04/18   [provider]  calcium carbonate (TUMS - DOSED IN MG ELEMENTAL CALCIUM) 500 MG chewable tablet Chew 2 tablets by mouth daily as needed for  indigestion or heartburn.    [provider]  dorzolamide-timolol (COSOPT) 22.3-6.8 MG/ML ophthalmic solution 1 drop 2 (two) times daily.    [provider]  empagliflozin (JARDIANCE) 25 MG TABS tablet Take 1 tablet (25 mg total) by mouth daily. 12/23/22   Shade Flood, MD  fexofenadine (ALLEGRA) 180 MG tablet Take 180 mg by mouth daily.    [provider]  fluticasone (FLONASE) 50 MCG/ACT nasal spray PLACE 2 SPRAYS INTO BOTH NOSTRILS DAILY. 07/09/14   Jonita Albee, MD  gabapentin (NEURONTIN) 300 MG capsule TAKE ONE CAPSULE BY MOUTH AT BEDTIME 12/14/22   Shade Flood, MD  hydrOXYzine (ATARAX) 25 MG tablet Take 0.5-1 tablets (12.5-25 mg total) by mouth at bedtime as needed for anxiety. 12/23/22   Shade Flood, MD  insulin glargine-yfgn (SEMGLEE, YFGN,) 100 UNIT/ML Pen Inject 50 Units into the skin daily. (Titrate as directed up to max daily dose of 100 units) 07/20/22   [provider]  lisinopril (ZESTRIL) 40 MG tablet TAKE ONE TABLET BY MOUTH ONE TIME DAILY 08/23/22   Shade Flood, MD  metFORMIN (GLUCOPHAGE) 1000 MG tablet TAKE ONE TABLET BY MOUTH TWICE A DAY 12/23/22   Shade Flood, MD  Multiple Vitamin (MULTIVITAMIN) tablet Take 1 tablet by mouth every evening.     [provider]  sertraline (ZOLOFT) 100 MG tablet Take 1.5 tablets (150 mg total) by mouth daily. 12/23/22  Shade Flood, MD  sildenafil (VIAGRA) 100 MG tablet TAKE ONE-HALF TO ONE TABLET BY MOUTH ONE TIME DAILY AS NEEDED FOR ERECTILE DYSFUNCTION 03/07/23   Shade Flood, MD  tirzepatide [facility]) 15 MG/0.5ML Pen Inject 15 mg Subcutaneously once a week 90 days 06/17/22     Travoprost, BAK Free, (TRAVATAN) 0.004 % SOLN ophthalmic solution SMARTSIG:In Eye(s) 01/28/21   [provider]  Vitamin D, Ergocalciferol, (DRISDOL) 1.25 MG (50000 UNIT) CAPS capsule Take 1 capsule (50,000 Units total) by mouth every 7 (seven) days. 09/13/22   Whitmire, Thermon Leyland, FNP       Allergies    Other    Review of Systems   Review of Systems  All other systems reviewed and are negative.   Physical Exam Updated Vital Signs BP 135/72   Pulse 73   Resp 18   SpO2 100%  Physical Exam Vitals and nursing note reviewed.  Constitutional:      General: He is not in acute distress.    Appearance: Normal appearance. He is well-developed.  HENT:     Head: Normocephalic and atraumatic.  Eyes:     Conjunctiva/sclera: Conjunctivae normal.     Pupils: Pupils are equal, round, and reactive to light.  Cardiovascular:     Rate and Rhythm: Normal rate and regular rhythm.     Heart sounds: Normal heart sounds.  Pulmonary:     Effort: Pulmonary effort is normal. No respiratory distress.     Breath sounds: Normal breath sounds.  Abdominal:     General: There is no distension.     Palpations: Abdomen is soft.     Tenderness: There is no abdominal tenderness.  Musculoskeletal:        General: No deformity. Normal range of motion.     Cervical back: Normal range of motion and neck supple.  Skin:    General: Skin is warm and dry.  Neurological:     General: No focal deficit present.     Mental Status: He is alert and oriented to person, place, and time.     ED Results / Procedures / Treatments   Labs (all labs ordered are listed, but only abnormal results are displayed) Labs Reviewed  RESP PANEL BY RT-PCR (RSV, FLU A&B, COVID)  [id number]  CBC WITH DIFFERENTIAL/PLATELET  BASIC METABOLIC PANEL  URINALYSIS, W/ REFLEX TO CULTURE (INFECTION SUSPECTED)  CBG MONITORING, ED    EKG None  Radiology No results found.  Procedures Procedures    Medications Ordered in ED Medications - No data to display  ED Course/ Medical Decision Making/ A&P                                 Medical Decision Making Amount and/or Complexity of Data Reviewed Labs: ordered.    Medical Screen Complete  This patient presented to the ED with complaint of hypoglycemia.  This  complaint involves an extensive number of treatment options. The initial differential diagnosis includes, but is not limited to, hypoglycemia, other metabolic abnormality, infection, etc.  This presentation is: Acute, Self-Limited, Previously Undiagnosed, Uncertain Prognosis, Complicated, Systemic Symptoms, and Threat to Life/Bodily Function  Patient is presenting with complaint of hypoglycemia.  EMS treated hypoglycemia during transport.  On arrival the patient is without complaint.  Glucose is improved.  Patient is currently on antibiotics for treatment of suspected bacterial sinusitis.  Screening labs obtained are without significant abnormality.  Repeat glucose testing  is also without significant abnormality.  Patient desires discharge.  He does understand need for close outpatient follow-up.  Strict return precautions given and understood.  Additional history obtained: External records from outside sources obtained and reviewed including prior ED visits and prior Inpatient records.   Problem List / ED Course:  Hypoglycemia    Reevaluation:  After the interventions noted above, I reevaluated the patient and found that they have: improved  Disposition:  After consideration of the diagnostic results and the patients response to treatment, I feel that the patent would benefit from close outpatient followup.          Final Clinical Impression(s) / ED Diagnoses Final diagnoses:  Hypoglycemia    Rx / DC Orders ED Discharge Orders     None         Wynetta Fines, MD 03/18/23 1615

## 2023-03-18 NOTE — Discharge Instructions (Signed)
Return for any problem.  ?

## 2023-03-18 NOTE — ED Notes (Signed)
Patient verbalizes understanding of discharge instructions. Opportunity for questioning and answers were provided. Armband removed by staff, pt discharged from ED. Pt taken to ED entrance via wheel chair.  

## 2023-03-30 ENCOUNTER — Encounter: Payer: Self-pay | Admitting: Family Medicine

## 2023-03-30 ENCOUNTER — Ambulatory Visit: Payer: Managed Care, Other (non HMO) | Admitting: Family Medicine

## 2023-03-30 VITALS — BP 136/72 | HR 87 | Temp 97.9°F | Wt 289.8 lb

## 2023-03-30 DIAGNOSIS — J329 Chronic sinusitis, unspecified: Secondary | ICD-10-CM

## 2023-03-30 DIAGNOSIS — R430 Anosmia: Secondary | ICD-10-CM | POA: Diagnosis not present

## 2023-03-30 DIAGNOSIS — H532 Diplopia: Secondary | ICD-10-CM

## 2023-03-30 DIAGNOSIS — R0981 Nasal congestion: Secondary | ICD-10-CM | POA: Diagnosis not present

## 2023-03-30 MED ORDER — IPRATROPIUM BROMIDE 0.06 % NA SOLN
1.0000 | Freq: Four times a day (QID) | NASAL | 5 refills | Status: AC
Start: 2023-03-30 — End: ?

## 2023-03-30 NOTE — Progress Notes (Signed)
Subjective:  Patient ID: Brandon Rich, male    DOB: [birth year]  Age: 62 y.o. MRN: [id number]  CC:  Chief Complaint  Patient presents with   loss of smell    States it is starting to come back. Finished the augmentin Friday.     HPI Brandon Rich presents for  Loss of taste, smell Follow-up from August 21 visit.  COVID-19 testing had been negative, MRI brain Ordered with report of diplopia on exam.  Reassuringly he had no acute intracranial abnormality or mass and mild chronic small vessel ischemic disease.  There was extensive bilateral ethmoid and left sphenoid sinusitis.  ENT referral had initially been ordered but was treated with a course of Augmentin for possible sinusitis contributor. Finished Augmentin last Friday, reports some improvement in symptoms and is starting to regain his sense of smell. Helped significantly. Now able to taste salt and some bitter, sweet and various foods. 50% better on smell. Minimal nasal congestion. Uses flonase 2spr/nostril every day.  ENT appt in December.  Optho appt next month. No diplopia with his usual activities.  History Patient Active Problem List   Diagnosis Date Noted   Class 3 severe obesity with serious comorbidity and body mass index (BMI) of 45.0 to 49.9 in adult [facility]) 08/16/2022   Hx of adenomatous colonic polyps 08/09/2022   Type 2 diabetes mellitus with obesity (HCC) 07/05/2022   At risk for hyperglycemia 03/15/2022   Polyphagia 08/26/2021   Acquired hallux rigidus of left foot 04/14/2021   Nonalcoholic hepatosteatosis 12/22/2020   Other fatigue 12/04/2020   SOBOE (shortness of breath on exertion) 12/04/2020   Vitamin D deficiency 12/04/2020   Depression 12/04/2020   Acquired claw toe of right foot 04/01/2020   Class 3 severe obesity due to excess calories with serious comorbidity and body mass index (BMI) of 40.0 to 44.9 in adult (HCC) 09/10/2019   Chest discomfort 02/02/2019   Morbid obesity (HCC) 02/02/2019    Cortical age-related cataract of both eyes 03/30/2017   Nuclear sclerotic cataract of both eyes 03/30/2017   Primary open angle glaucoma (POAG) of both eyes, indeterminate stage 03/30/2017   Type 2 diabetes mellitus with foot ulcer, with long-term current use of insulin (HCC) 03/30/2017   LVH (left ventricular hypertrophy) 03/29/2016   T wave inversion in EKG 02/16/2016   Transaminitis 02/16/2016   Pain of left great toe 08/23/2013   Hypertension associated with type 2 diabetes mellitus (HCC) 12/20/2011   Diabetes mellitus (HCC) 12/20/2011   Hyperlipidemia associated with type 2 diabetes mellitus (HCC) 12/20/2011   Glaucoma (increased eye pressure) 07/19/1996   Past Medical History:  Diagnosis Date   Acute combined systolic and diastolic heart failure (HCC) 02/16/2016   Allergy    Anxiety    [employer]reesia 12/27/2019   Anxiety    DDD (degenerative disc disease), cervical    Depression    Diabetes mellitus    Diabetes mellitus without complication (HCC)    [employer]reesia 12/27/2019   Diabetic foot ulcer (HCC)    FUO (fever of unknown origin) 02/16/2016   Glaucoma    Heartburn    Hx of adenomatous colonic polyps 08/09/2022   5 adenomas max 10 mm recall 2027   Hyperlipidemia    Hypertension    Hypertrophied anal papilla    LVH (left ventricular hypertrophy) 03/29/2016   Palpitation    Rash and nonspecific skin eruption 02/16/2016   Sleep apnea    CPAP   SOBOE (shortness of breath on exertion)  Transaminitis 02/16/2016   Past Surgical History:  Procedure Laterality Date   AMPUTATION TOE Right 04/2020   AMPUTATION TOE Right 01/2020   AMPUTATION TOE Left    Big toe   HERNIA REPAIR     SPINE SURGERY     L4/L5   TUMOR REMOVAL  12/1998   Bone Cyst   Allergies  Allergen Reactions   Other Other (See Comments)    Hayfever   Prior to Admission medications   Medication Sig Start Date End Date Taking? Authorizing Provider  amoxicillin-clavulanate (AUGMENTIN) 875-125 MG tablet Take  1 tablet by mouth 2 (two) times daily. 03/14/23  Yes Shade Flood, MD  aspirin 81 MG tablet Take 81 mg by mouth daily.   Yes [provider]  atorvastatin (LIPITOR) 20 MG tablet Take 1 tablet (20 mg total) by mouth daily. 12/23/22  Yes Shade Flood, MD  BD PEN NEEDLE NANO U/F 32G X 4 MM MISC  07/04/18  Yes [provider]  calcium carbonate (TUMS - DOSED IN MG ELEMENTAL CALCIUM) 500 MG chewable tablet Chew 2 tablets by mouth daily as needed for indigestion or heartburn.   Yes [provider]  dorzolamide-timolol (COSOPT) 22.3-6.8 MG/ML ophthalmic solution 1 drop 2 (two) times daily.   Yes [provider]  empagliflozin (JARDIANCE) 25 MG TABS tablet Take 1 tablet (25 mg total) by mouth daily. 12/23/22  Yes Shade Flood, MD  fexofenadine (ALLEGRA) 180 MG tablet Take 180 mg by mouth daily.   Yes [provider]  fluticasone (FLONASE) 50 MCG/ACT nasal spray PLACE 2 SPRAYS INTO BOTH NOSTRILS DAILY. 07/09/14  Yes GuestAshley Jacobs, MD  gabapentin (NEURONTIN) 300 MG capsule TAKE ONE CAPSULE BY MOUTH AT BEDTIME 12/14/22  Yes Shade Flood, MD  hydrOXYzine (ATARAX) 25 MG tablet Take 0.5-1 tablets (12.5-25 mg total) by mouth at bedtime as needed for anxiety. 12/23/22  Yes Shade Flood, MD  insulin glargine-yfgn (SEMGLEE, YFGN,) 100 UNIT/ML Pen Inject 50 Units into the skin daily. (Titrate as directed up to max daily dose of 100 units) 07/20/22  Yes [provider]  lisinopril (ZESTRIL) 40 MG tablet TAKE ONE TABLET BY MOUTH ONE TIME DAILY 08/23/22  Yes Shade Flood, MD  metFORMIN (GLUCOPHAGE) 1000 MG tablet TAKE ONE TABLET BY MOUTH TWICE A DAY 12/23/22  Yes Shade Flood, MD  Multiple Vitamin (MULTIVITAMIN) tablet Take 1 tablet by mouth every evening.    Yes [provider]  sertraline (ZOLOFT) 100 MG tablet Take 1.5 tablets (150 mg total) by mouth daily. 12/23/22  Yes Shade Flood, MD  sildenafil (VIAGRA) 100 MG tablet TAKE  ONE-HALF TO ONE TABLET BY MOUTH ONE TIME DAILY AS NEEDED FOR ERECTILE DYSFUNCTION 03/07/23  Yes Shade Flood, MD  tirzepatide [facility]) 15 MG/0.5ML Pen Inject 15 mg Subcutaneously once a week 90 days 06/17/22  Yes   Travoprost, BAK Free, (TRAVATAN) 0.004 % SOLN ophthalmic solution SMARTSIG:In Eye(s) 01/28/21  Yes [provider]  Vitamin D, Ergocalciferol, (DRISDOL) 1.25 MG (50000 UNIT) CAPS capsule Take 1 capsule (50,000 Units total) by mouth every 7 (seven) days. 09/13/22  Yes Whitmire, Thermon Leyland, FNP   Social History   Socioeconomic History   Marital status: Married    Spouse name: Brandon Rich   Number of children: 2   Years of education: 16   Highest education level: Bachelor's degree (e.g., BA, AB, BS)  Occupational History   Occupation: Education officer, museum  Tobacco Use   Smoking status:  Never   Smokeless tobacco: Never  Vaping Use   Vaping status: Never Used  Substance and Sexual Activity   Alcohol use: No    Alcohol/week: 0.0 standard drinks of alcohol    Comment: per pt 1 drink once in a long while   Drug use: No   Sexual activity: Yes    Partners: Female  Other Topics Concern   Not on file  Social History Narrative   Married. Education: [employer].    Social Determinants of Health   Financial Resource Strain: Low Risk  (12/20/2022)   Overall Financial Resource Strain (CARDIA)    Difficulty of Paying Living Expenses: Not very hard  Food Insecurity: No Food Insecurity (12/20/2022)   Hunger Vital Sign    Worried About Running Out of Food in the Last Year: Never true    Ran Out of Food in the Last Year: Never true  Transportation Needs: No Transportation Needs (12/20/2022)   PRAPARE - Administrator, Civil Service (Medical): No    Lack of Transportation (Non-Medical): No  Physical Activity: Insufficiently Active (12/20/2022)   Exercise Vital Sign    Days of Exercise per Week: 1 day    Minutes of Exercise per Session: 20 min  Stress: No Stress Concern  Present (12/20/2022)   [employer] of Occupational Health - Occupational Stress Questionnaire    Feeling of Stress : Only a little  Social Connections: Moderately Isolated (12/20/2022)   Social Connection and Isolation Panel [NHANES]    Frequency of Communication with Friends and Family: Twice a week    Frequency of Social Gatherings with Friends and Family: Once a week    Attends Religious Services: Never    Database administrator or Organizations: No    Attends Engineer, structural: Not on file    Marital Status: Married  Catering manager Violence: Not on file    Review of Systems   Objective:   Vitals:   03/30/23 0919  BP: 136/72  Pulse: 87  Temp: 97.9 F (36.6 C)  TempSrc: Temporal  SpO2: 96%  Weight: 289 lb 12.8 oz (131.5 kg)     Physical Exam Vitals reviewed.  Constitutional:      Appearance: He is well-developed.  HENT:     Head: Normocephalic and atraumatic.     Right Ear: Tympanic membrane, ear canal and external ear normal.     Left Ear: Tympanic membrane, ear canal and external ear normal.     Nose: Congestion present. No rhinorrhea.     Comments: Sinuses nontender, slight edema of turbinates right greater than left.    Mouth/Throat:     Mouth: Mucous membranes are moist.     Pharynx: No oropharyngeal exudate or posterior oropharyngeal erythema.  Eyes:     Extraocular Movements: Extraocular movements intact.     Conjunctiva/sclera: Conjunctivae normal.     Pupils: Pupils are equal, round, and reactive to light.     Comments: Slight diplopia with terminal superior and leftward gaze only.   Cardiovascular:     Rate and Rhythm: Normal rate and regular rhythm.     Heart sounds: Normal heart sounds. No murmur heard. Pulmonary:     Effort: Pulmonary effort is normal.     Breath sounds: Normal breath sounds. No wheezing, rhonchi or rales.  Abdominal:     Palpations: Abdomen is soft.     Tenderness: There is no abdominal tenderness.   Musculoskeletal:     Cervical back: Neck supple.  Lymphadenopathy:     Cervical: No cervical adenopathy.  Skin:    General: Skin is warm and dry.     Findings: No rash.  Neurological:     General: No focal deficit present.     Mental Status: He is alert and oriented to person, place, and time.     Comments: Slight diplopia with superior and left lateral gaze as above.  Psychiatric:        Mood and Affect: Mood normal.        Behavior: Behavior normal.      Assessment & Plan:  ZYHEIR EASTER is a 62 y.o. male . Loss of smell - Plan: ipratropium (ATROVENT) 0.06 % nasal spray, Ambulatory referral to Neurology  Nasal congestion - Plan: ipratropium (ATROVENT) 0.06 % nasal spray  Sinusitis, unspecified chronicity, unspecified location - Plan: ipratropium (ATROVENT) 0.06 % nasal spray  Diplopia - Plan: Ambulatory referral to Neurology  Sense of smell and taste has improved with treatment of sinusitis.  Still some residual congestion, Atrovent nasal spray as option, continue Flonase.  Has follow-up with ENT, okay to keep that appointment if symptoms have not completely resolved.  Although reassuring MRI, still with some diplopia with terminal superior and left lateral gaze.  Refer to neurology to decide if other testing indicated.  RTC precautions if new symptoms.  Meds ordered this encounter  Medications   ipratropium (ATROVENT) 0.06 % nasal spray    Sig: Place 1-2 sprays into both nostrils 4 (four) times daily. As needed for nasal congestion    Dispense:  15 mL    Refill:  5   Patient Instructions  Thanks for coming in today.  Glad to hear the congestion and smell has improved.  Can use Atrovent nasal spray for congestion as needed, continue Flonase.  Follow-up with ENT as planned if you are still having symptoms.  I did refer you to neurology to discuss the double vision with looking up and to the left but your MRI was reassuring.  If any new symptoms please let me know.  Take  care.     Signed,   Meredith Staggers, MD  Primary Care, [facility] 03/30/23 9:46 AM

## 2023-03-30 NOTE — Patient Instructions (Signed)
Thanks for coming in today.  Glad to hear the congestion and smell has improved.  Can use Atrovent nasal spray for congestion as needed, continue Flonase.  Follow-up with ENT as planned if you are still having symptoms.  I did refer you to neurology to discuss the double vision with looking up and to the left but your MRI was reassuring.  If any new symptoms please let me know.  Take care.

## 2023-04-28 ENCOUNTER — Institutional Professional Consult (permissible substitution) (INDEPENDENT_AMBULATORY_CARE_PROVIDER_SITE_OTHER): Payer: Managed Care, Other (non HMO) | Admitting: Otolaryngology

## 2023-04-30 ENCOUNTER — Other Ambulatory Visit: Payer: Self-pay | Admitting: Family Medicine

## 2023-04-30 DIAGNOSIS — I10 Essential (primary) hypertension: Secondary | ICD-10-CM

## 2023-05-04 ENCOUNTER — Encounter (INDEPENDENT_AMBULATORY_CARE_PROVIDER_SITE_OTHER): Payer: Self-pay

## 2023-05-05 ENCOUNTER — Encounter: Payer: Self-pay | Admitting: Neurology

## 2023-05-05 ENCOUNTER — Ambulatory Visit (INDEPENDENT_AMBULATORY_CARE_PROVIDER_SITE_OTHER): Payer: Managed Care, Other (non HMO) | Admitting: Neurology

## 2023-05-05 VITALS — BP 133/69 | HR 73 | Ht 68.0 in | Wt 289.0 lb

## 2023-05-05 DIAGNOSIS — H532 Diplopia: Secondary | ICD-10-CM | POA: Diagnosis not present

## 2023-05-05 NOTE — Patient Instructions (Signed)
Please follow up closely with your endocrinologist and ophthalmologist.  Your exam does not show any complete cranial nerve palsy, ie nerve damage or irritation affecting eye motility nerves.  Your neurological exam is normal with the exception of (known) numbness in the feet.  Good diabetes control and weight loss are key.  Please hydrate well with water.  You can make an appointment for your sleep apnea follow up.  We will do a carotid Doppler ultrasound, which looks at the main arteries of the neck. We will call you with the test results. Likely, they will call to schedule after insurance authorizes the test. Most likely, you will go to [facility] for the test, but you may be able to choose another location if prefer.

## 2023-05-05 NOTE — Progress Notes (Signed)
Subjective:    Patient ID: Brandon Rich is a 62 y.o. male.  HPI    Interim history:    Dear Dr. Neva Seat,  I saw your patient, Brandon Rich, upon your kind request in my neurologic clinic today for initial consultation of his double vision.  The patient is unaccompanied today.  As you know, Brandon Rich is a 62 year old male with an underlying medical complex medical history of congestive heart failure, allergies, depression, diabetes, history of diabetic foot ulcer, palpitations, hypertension, hyperlipidemia, severe obstructive sleep apnea (on PAP therapy), and severe obesity with a BMI of over 40, who reports intermittent double vision on side-to-side gaze since August 2024.  Symptoms were not sudden in onset as far as he recalls, no other weakness, no droopy eyelid or droopy face, no slurring of speech, no strokelike symptoms.  He has stable and no new foot pain and numbness in his feet.  He does endorse falling a few months ago.  He was in a hotel bathroom and fell as he turned in the shower tub, thankfully no head injury but landed on his behind.  He does have some tendency to dehydrate.  He tries to hydrate well with water.  He has appointment with his ophthalmologist, he did not make a sooner appointment, his routine follow-up appointment is approaching within the next month.  He gets a full eye exam at least twice a year for his glaucoma and to check for diabetic retinopathy.  No history of diabetic retinopathy as far as he knows.  He also gets a visual field check at least once a year with his ophthalmologist.    He denies any significant headache. He drinks alcohol rarely.  He is a non-smoker.  I reviewed your office note from 03/30/2023.  At that time he had recently finished Augmentin for bilateral ethmoid and left sphenoid sinusitis.  He reported improvement in his sense of taste and smell.  He had a brain MRI with and without contrast on 03/10/2023 and I reviewed the results:     IMPRESSION: 1. No acute intracranial abnormality or mass. 2. Mild chronic small vessel ischemic disease. 3. Extensive bilateral ethmoid and left sphenoid sinusitis.  I do not see a recent A1c in his chart but he reports that a couple of months ago it was 7.1 per endocrinology, he sees his endocrinologist at [facility].  In November 2023 his A1c was 7.9, elevated.  I also reviewed your office note from 03/09/2023, at which time he reported a loss of sense of taste and smell for about 2 weeks.  He had tested for COVID at home which was negative.    He presented to the emergency room on 03/18/2023 with hypoglycemia.  His blood sugar at home was 34.  He improved with oral glucose and glucagon. Of note, I had evaluated him for his obstructive sleep apnea in 2022.  He was on AutoPap therapy at the time and needed a new machine.  He had a home sleep test through our office on 07/21/2021 which showed severe obstructive sleep apnea with a total AHI of 65.7/hour and O2 nadir of 80% with significant time below or at 88% saturation of 38.6 minutes.  I recommended ongoing treatment with PAP therapy and I wrote for a new AutoPap machine.  He did not return for a follow-up appointment.  Previously:   06/18/2021: 62 year old right-handed gentleman with an underlying medical history of diabetes, glaucoma, hypertension, hyperlipidemia, LVH, palpitations, history of congestive heart failure, allergies,  anxiety, degenerative cervical disc disease, depression, reflux disease, elevated liver enzymes, and obesity, who was previously diagnosed with obstructive sleep apnea and placed on positive airway pressure treatment.  Prior sleep study results are not available for my review today, testing was several years ago, around 2003.  He reports having had a sleep study at [facility].  I was able to review his compliance data from the past 30 days.  He has been 100% compliance, average usage of 7 hours and 44 minutes,  residual AHI at goal at 0.5/h, pressure range of 9 to 14 cm with EPR of 1, average pressure for the 95th percentile at 13.2 cm, leak low, nearly absent.  He uses a P 10 nasal pillows interface and has done well with it, started off with a nasal mask some years ago.  He has not gone without treatment within the past nearly 20 years for more than a handful of times.  This is his second machine and he purchased it out-of-pocket and gets his supplies online. He has been working on weight loss and has achieved a significant amount of weight reduction.  He has not had a reevaluation of his sleep apnea in years or follow-up with a sleep specialist.  His Epworth sleepiness score is 2 out of 24, fatigue severity score is 26 out of 63.  He is going to have left foot surgery next month.  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  He is diabetes control has improved over time.  Bedtime is currently between 9 and 9:30 PM and rise time between 5 and 7 most of the mornings.  He has been working from home, works as an Art gallery manager, on Human resources officer.  He is a non-smoker and drinks alcohol very rarely, caffeine in limitation, 1 cup of coffee generally once a day.  He lives with his wife, they have 1 dog in the household, they have 2 grown children, ages 50 and 57.  As he recalls, his sleep apnea was severe at the time, he remembers 40+ events per hour.  From his description, he had a split-night sleep study at the time.  He would be willing to get reevaluated.  He is probably eligible for new machine as his current machine is about 8 to 63 years old.    His Past Medical History Is Significant For: Past Medical History:  Diagnosis Date   Acute combined systolic and diastolic heart failure (HCC) 02/16/2016   Allergy    Anxiety    Phreesia 12/27/2019   Anxiety    DDD (degenerative disc disease), cervical    Depression    Diabetes mellitus    Diabetes mellitus without complication (HCC)    Phreesia  12/27/2019   Diabetic foot ulcer (HCC)    FUO (fever of unknown origin) 02/16/2016   Glaucoma    Heartburn    Hx of adenomatous colonic polyps 08/09/2022   5 adenomas max 10 mm recall 2027   Hyperlipidemia    Hypertension    Hypertrophied anal papilla    LVH (left ventricular hypertrophy) 03/29/2016   Palpitation    Rash and nonspecific skin eruption 02/16/2016   Sleep apnea    CPAP   SOBOE (shortness of breath on exertion)    Transaminitis 02/16/2016    His Past Surgical History Is Significant For: Past Surgical History:  Procedure Laterality Date   AMPUTATION TOE Right 04/2020   AMPUTATION TOE Right 01/2020   AMPUTATION TOE Left  Big toe   HERNIA REPAIR     SPINE SURGERY     L4/L5   TUMOR REMOVAL  12/1998   Bone Cyst    His Family History Is Significant For: Family History  Problem Relation Age of Onset   Skin cancer Mother    Cancer Mother    Heart disease Mother        Atrial Fib   Hypertension Mother    Hyperlipidemia Mother    Obesity Mother    Cancer Father    Heart disease Father        Valve Disease   Heart disease Maternal Grandmother    Hyperlipidemia Maternal Grandmother    Hypertension Maternal Grandmother    Heart disease Maternal Grandfather    Heart disease Paternal Grandfather    Hypertension Paternal Grandfather    Hyperlipidemia Paternal Grandfather    Sleep apnea Neg Hx    Colon cancer Neg Hx    Esophageal cancer Neg Hx    Stomach cancer Neg Hx     His Social History Is Significant For: Social History   Socioeconomic History   Marital status: Married    Spouse name: Martel Galvan   Number of children: 2   Years of education: 16   Highest education level: Bachelor's degree (e.g., BA, AB, BS)  Occupational History   Occupation: Education officer, museum  Tobacco Use   Smoking status: Never   Smokeless tobacco: Never  Vaping Use   Vaping status: Never Used  Substance and Sexual Activity   Alcohol use: No    Alcohol/week: 0.0  standard drinks of alcohol    Comment: per pt 1 drink once in a long while   Drug use: No   Sexual activity: Yes    Partners: Female  Other Topics Concern   Not on file  Social History Narrative   Married. Education: [employer].    Social Determinants of Health   Financial Resource Strain: Low Risk  (12/20/2022)   Overall Financial Resource Strain (CARDIA)    Difficulty of Paying Living Expenses: Not very hard  Food Insecurity: No Food Insecurity (12/20/2022)   Hunger Vital Sign    Worried About Running Out of Food in the Last Year: Never true    Ran Out of Food in the Last Year: Never true  Transportation Needs: No Transportation Needs (12/20/2022)   PRAPARE - Administrator, Civil Service (Medical): No    Lack of Transportation (Non-Medical): No  Physical Activity: Insufficiently Active (12/20/2022)   Exercise Vital Sign    Days of Exercise per Week: 1 day    Minutes of Exercise per Session: 20 min  Stress: No Stress Concern Present (12/20/2022)   [employer] of Occupational Health - Occupational Stress Questionnaire    Feeling of Stress : Only a little  Social Connections: Moderately Isolated (12/20/2022)   Social Connection and Isolation Panel [NHANES]    Frequency of Communication with Friends and Family: Twice a week    Frequency of Social Gatherings with Friends and Family: Once a week    Attends Religious Services: Never    Database administrator or Organizations: No    Attends Engineer, structural: Not on file    Marital Status: Married    His Allergies Are:  Allergies  Allergen Reactions   Other Other (See Comments)    Hayfever  :   His Current Medications Are:  Outpatient Encounter Medications as of 05/05/2023  Medication Sig   aspirin  81 MG tablet Take 81 mg by mouth daily.   atorvastatin (LIPITOR) 20 MG tablet Take 1 tablet (20 mg total) by mouth daily.   BD PEN NEEDLE NANO U/F 32G X 4 MM MISC    calcium carbonate (TUMS - DOSED IN MG  ELEMENTAL CALCIUM) 500 MG chewable tablet Chew 2 tablets by mouth daily as needed for indigestion or heartburn.   dorzolamide-timolol (COSOPT) 22.3-6.8 MG/ML ophthalmic solution 1 drop 2 (two) times daily.   empagliflozin (JARDIANCE) 25 MG TABS tablet Take 1 tablet (25 mg total) by mouth daily.   fexofenadine (ALLEGRA) 180 MG tablet Take 180 mg by mouth daily.   fluticasone (FLONASE) 50 MCG/ACT nasal spray PLACE 2 SPRAYS INTO BOTH NOSTRILS DAILY.   gabapentin (NEURONTIN) 300 MG capsule TAKE ONE CAPSULE BY MOUTH AT BEDTIME   hydrOXYzine (ATARAX) 25 MG tablet Take 0.5-1 tablets (12.5-25 mg total) by mouth at bedtime as needed for anxiety.   insulin glargine-yfgn (SEMGLEE, YFGN,) 100 UNIT/ML Pen Inject 50 Units into the skin daily. (Titrate as directed up to max daily dose of 100 units)   ipratropium (ATROVENT) 0.06 % nasal spray Place 1-2 sprays into both nostrils 4 (four) times daily. As needed for nasal congestion   lisinopril (ZESTRIL) 40 MG tablet TAKE ONE TABLET BY MOUTH ONE TIME DAILY   metFORMIN (GLUCOPHAGE) 1000 MG tablet TAKE ONE TABLET BY MOUTH TWICE A DAY   Multiple Vitamin (MULTIVITAMIN) tablet Take 1 tablet by mouth every evening.    sertraline (ZOLOFT) 100 MG tablet Take 1.5 tablets (150 mg total) by mouth daily.   sildenafil (VIAGRA) 100 MG tablet TAKE ONE-HALF TO ONE TABLET BY MOUTH ONE TIME DAILY AS NEEDED FOR ERECTILE DYSFUNCTION   tirzepatide (MOUNJARO) 15 MG/0.5ML Pen Inject 15 mg Subcutaneously once a week 90 days   Travoprost, BAK Free, (TRAVATAN) 0.004 % SOLN ophthalmic solution SMARTSIG:In Eye(s)   Vitamin D, Ergocalciferol, (DRISDOL) 1.25 MG (50000 UNIT) CAPS capsule Take 1 capsule (50,000 Units total) by mouth every 7 (seven) days.   amoxicillin-clavulanate (AUGMENTIN) 875-125 MG tablet Take 1 tablet by mouth 2 (two) times daily.   Facility-Administered Encounter Medications as of 05/05/2023  Medication   0.9 %  sodium chloride infusion  :  Review of Systems:  Out of  a complete 14 point review of systems, all are reviewed and negative with the exception of these symptoms as listed below:   Review of Systems  Neurological:        Pt here for Diplopia  Pt states double vision in Peripheral .     Objective:  Neurological Exam  Physical Exam Physical Examination:   Vitals:   05/05/23 0826  BP: 133/69  Pulse: 73    General Examination: The patient is a very pleasant 62 y.o. male in no acute distress. He appears well-developed and well-nourished and well groomed.   HEENT: Normocephalic, atraumatic, pupils are equal, round and reactive to light, no photophobia.  Funduscopic exam benign.  On lateral gaze, he reports horizontal diplopia, no clear-cut APD, no clear-cut abducens palsy unilaterally or bilaterally.  Could be mild abducens weakness bilaterally.  No disconjugate gaze, no diplopia upon vertical gaze or central vision.  Extraocular tracking is otherwise fine, no nystagmus.  Hearing grossly intact.  Face is symmetric, no ptosis, no facial asymmetry otherwise, normal facial sensation to light touch, temperature and vibration sense.  Airway examination reveals good tongue protrusion, good tongue movements.  Speech is clear without dysarthria, hypophonia or voice tremor.  No carotid bruits.  Chest: Clear to auscultation without wheezing, rhonchi or crackles noted.  Heart: S1+S2+0, regular and normal without murmurs, rubs or gallops noted.   Abdomen: Soft, non-tender and non-distended.  Extremities: There is no pitting edema in the distal lower extremities bilaterally.   Skin: Warm and dry without trophic changes noted.   Musculoskeletal: exam reveals no obvious joint deformities.   Neurologically:  Mental status: The patient is awake, alert and oriented in all 4 spheres. His immediate and remote memory, attention, language skills and fund of knowledge are appropriate. There is no evidence of aphasia, agnosia, apraxia or anomia. Speech is clear  with normal prosody and enunciation. Thought process is linear. Mood is normal and affect is normal.  Cranial nerves II - XII are as described above under HEENT exam.  Motor exam: Normal bulk, strength and tone is noted. There is no drift, postural or action or intention tremor, no rebound.   Fine motor skills and coordination: Normal finger taps, hand movements and rapid alternating patting with both upper extremities, normal foot taps bilaterally in the lower extremities.  Reflexes are 1+ in the upper extremities and absent in the lower extremities, toes are downgoing bilaterally. Cerebellar testing: No dysmetria or intention tremor. There is no truncal or gait ataxia.  Normal finger-to-nose, normal heel-to-shin bilaterally. Sensory exam: intact to light touch, temperature and vibration sense in the upper extremities, diminished vibration sense in the distal lower extremities bilaterally up to the shin areas.    Gait, station and balance: He stands easily. No veering to one side is noted. No leaning to one side is noted. Posture is age-appropriate and stance is narrow based. Gait shows normal stride length and normal pace. No problems turning are noted.   Romberg is negative but tandem walk is difficult for him.  Assessment and plan:  In summary, Brandon Rich is a very pleasant 62 y.o.-year old male with an underlying medical complex medical history of congestive heart failure, allergies, depression, diabetes, history of diabetic foot ulcer, palpitations, hypertension, hyperlipidemia, severe obstructive sleep apnea (on PAP therapy), and severe obesity with a BMI of over 40, who presents for evaluation of his intermittent double vision on lateral gaze for the past 2+ months.  He does not have any clear-cut abducens palsy, no ophthalmoplegia, no nystagmus.  He may have mild weakness of the lateral rectus muscles bilaterally.  His neurological exam otherwise is nonfocal with the exception of known  numbness in his feet, likely diabetic neuropathy.  He is at higher risk of cranial nerve palsy secondary to diabetes.  He is working on better diabetes control and weight loss.  He is largely reassured today but advised to keep close follow-up with endocrinology and ophthalmology.  He has an appointment with his ophthalmologist pending for about a month from now.  He is advised to get a full, dilated eye exam and get a visual field test and report his diplopia to them as well.  For further vascular checkup, I would like to proceed with a carotid Doppler ultrasound.  He had a recent brain MRI.  He is advised to make an appointment for sleep apnea follow-up also.  He is reminded to stay well-hydrated and well rested.  He reports improvement of nasal congestion and smell and taste after finishing his antibiotic course.  We will keep him posted as to his carotid Doppler ultrasound test results by phone call or MyChart message.  I answered all his questions today and he was in agreement with  our plan.   Thank you very much for allowing me to participate in the care of this nice patient. If I can be of any further assistance to you please do not hesitate to call me at [phone].  Sincerely,   Huston Foley, MD, PhD  I spent 45 minutes in total face-to-face time and in reviewing records during pre-charting, more than 50% of which was spent in counseling and coordination of care, reviewing test results, reviewing medications and treatment regimen and/or in discussing or reviewing the diagnosis of diplopia, the prognosis and treatment options. Pertinent laboratory and imaging test results that were available during this visit with the patient were reviewed by me and considered in my medical decision making (see chart for details).

## 2023-05-09 ENCOUNTER — Encounter (INDEPENDENT_AMBULATORY_CARE_PROVIDER_SITE_OTHER): Payer: Self-pay | Admitting: Otolaryngology

## 2023-05-09 ENCOUNTER — Ambulatory Visit (INDEPENDENT_AMBULATORY_CARE_PROVIDER_SITE_OTHER): Payer: Managed Care, Other (non HMO) | Admitting: Otolaryngology

## 2023-05-09 VITALS — BP 150/81 | HR 77 | Wt 292.0 lb

## 2023-05-09 DIAGNOSIS — J342 Deviated nasal septum: Secondary | ICD-10-CM

## 2023-05-09 DIAGNOSIS — J3089 Other allergic rhinitis: Secondary | ICD-10-CM

## 2023-05-09 DIAGNOSIS — K219 Gastro-esophageal reflux disease without esophagitis: Secondary | ICD-10-CM

## 2023-05-09 DIAGNOSIS — G4733 Obstructive sleep apnea (adult) (pediatric): Secondary | ICD-10-CM

## 2023-05-09 DIAGNOSIS — J3489 Other specified disorders of nose and nasal sinuses: Secondary | ICD-10-CM

## 2023-05-09 DIAGNOSIS — R490 Dysphonia: Secondary | ICD-10-CM

## 2023-05-09 DIAGNOSIS — R0981 Nasal congestion: Secondary | ICD-10-CM | POA: Diagnosis not present

## 2023-05-09 DIAGNOSIS — J343 Hypertrophy of nasal turbinates: Secondary | ICD-10-CM

## 2023-05-09 DIAGNOSIS — J329 Chronic sinusitis, unspecified: Secondary | ICD-10-CM

## 2023-05-09 DIAGNOSIS — J383 Other diseases of vocal cords: Secondary | ICD-10-CM

## 2023-05-09 DIAGNOSIS — R0982 Postnasal drip: Secondary | ICD-10-CM

## 2023-05-09 MED ORDER — FAMOTIDINE 20 MG PO TABS: 20.0000 mg | ORAL_TABLET | Freq: Two times a day (BID) | ORAL | 1 refills | Status: AC

## 2023-05-09 NOTE — Progress Notes (Signed)
ENT CONSULT:  Reason for Consult: Chronic nasal congestion symptoms of nasal obstruction persistent cough and persistent chronic dysphonia raspy voice  HPI: Brandon Rich is an 62 y.o. male with hx of OSA on CPAP dialy, hx of T2DM, here for persistent raspy voice and cough in the setting of chronic nasal obstruction.  He had foot surgery in June 2024 and intubation. Had a lot of throat irritation lasting x 2 months, and lots of coughing and had voice changes with deeper voice following the surgery.  He had significant coughing at the time but slowly his symptoms improved. He still has coughing in am, coughs up small amount of mucus. Then in August, 2024, he lost his sense of taste and smell. He had a round of Augmenting and his nasal congestion started to clear.  His PCP ordered MRI brain due to loss of taste and smell which showed mucosal thickening in the sinuses.  His taste and smell returned after nasal congestion improved. He has persistent cough and raspy voice since his sinus infection in August. He denies heartburn. He takes Scientist, research (medical) as needed. His sense of smell is back to baseline now.  He never had sinus or nasal surgery. He has seasonal allergies, worse in the Spring and end of the Summer. He is on Flonase and Allegra daily year around. Never been tested for allergies.  Originally from [place].   Records Reviewed:  Office visit with Meredith Staggers, MD family medicine 03/30/23 Loss of taste, smell Follow-up from August 21 visit.  COVID-19 testing had been negative, MRI brain Ordered with report of diplopia on exam.  Reassuringly he had no acute intracranial abnormality or mass and mild chronic small vessel ischemic disease.  There was extensive bilateral ethmoid and left sphenoid sinusitis.  ENT referral had initially been ordered but was treated with a course of Augmentin for possible sinusitis contributor. Finished Augmentin last Friday, reports some improvement in symptoms and is starting to  regain his sense of smell. Helped significantly. Now able to taste salt and some bitter, sweet and various foods. 50% better on smell. Minimal nasal congestion. Uses flonase 2spr/nostril every day.  ENT appt in December.  Optho appt next month. No diplopia with his usual activities cessation screenshot of    Past Medical History:  Diagnosis Date   Acute combined systolic and diastolic heart failure (HCC) 02/16/2016   Allergy    Anxiety    Phreesia 12/27/2019   Anxiety    DDD (degenerative disc disease), cervical    Depression    Diabetes mellitus    Diabetes mellitus without complication (HCC)    Phreesia 12/27/2019   Diabetic foot ulcer (HCC)    FUO (fever of unknown origin) 02/16/2016   Glaucoma    Heartburn    Hx of adenomatous colonic polyps 08/09/2022   5 adenomas max 10 mm recall 2027   Hyperlipidemia    Hypertension    Hypertrophied anal papilla    LVH (left ventricular hypertrophy) 03/29/2016   Palpitation    Rash and nonspecific skin eruption 02/16/2016   Sleep apnea    CPAP   SOBOE (shortness of breath on exertion)    Transaminitis 02/16/2016    Past Surgical History:  Procedure Laterality Date   AMPUTATION TOE Right 04/2020   AMPUTATION TOE Right 01/2020   AMPUTATION TOE Left    Big toe   HERNIA REPAIR     SPINE SURGERY     L4/L5   TUMOR REMOVAL  12/1998  Bone Cyst    Family History  Problem Relation Age of Onset   Skin cancer Mother    Cancer Mother    Heart disease Mother        Atrial Fib   Hypertension Mother    Hyperlipidemia Mother    Obesity Mother    Cancer Father    Heart disease Father        Valve Disease   Heart disease Maternal Grandmother    Hyperlipidemia Maternal Grandmother    Hypertension Maternal Grandmother    Heart disease Maternal Grandfather    Heart disease Paternal Grandfather    Hypertension Paternal Grandfather    Hyperlipidemia Paternal Grandfather    Sleep apnea Neg Hx    Colon cancer Neg Hx    Esophageal  cancer Neg Hx    Stomach cancer Neg Hx     Social History:  reports that he has never smoked. He has never used smokeless tobacco. He reports that he does not drink alcohol and does not use drugs.  Allergies:  Allergies  Allergen Reactions   Other Other (See Comments)    Hayfever    Medications: I have reviewed the patient's current medications.  The PMH, PSH, Medications, Allergies, and SH were reviewed and updated.  ROS: Constitutional: Negative for fever, weight loss and weight gain. Cardiovascular: Negative for chest pain and dyspnea on exertion. Respiratory: Is not experiencing shortness of breath at rest. Gastrointestinal: Negative for nausea and vomiting. Neurological: Negative for headaches. Psychiatric: The patient is not nervous/anxious  Blood pressure (!) 150/81, pulse 77, weight 292 lb (132.5 kg), SpO2 95%.  PHYSICAL EXAM:  Exam: General: Well-developed, well-nourished Communication and Voice: Clear pitch and clarity Respiratory Respiratory effort: Equal inspiration and expiration without stridor Cardiovascular Peripheral Vascular: Warm extremities with equal color/perfusion Eyes: No nystagmus with equal extraocular motion bilaterally Neuro/Psych/Balance: Patient oriented to person, place, and time; Appropriate mood and affect; Gait is intact with no imbalance; Cranial nerves I-XII are intact Head and Face Inspection: Normocephalic and atraumatic without mass or lesion Palpation: Facial skeleton intact without bony stepoffs Salivary Glands: No mass or tenderness Facial Strength: Facial motility symmetric and full bilaterally ENT Pinna: External ear intact and fully developed External canal: Canal is patent with intact skin Tympanic Membrane: Clear and mobile External Nose: No scar or anatomic deformity Internal Nose: Septum is deviated to the left with caudal septal spur on the right. No polyp, or purulence. Mucosal edema and erythema present.  Bilateral  inferior turbinate hypertrophy.  Lips, Teeth, and gums: Mucosa and teeth intact and viable TMJ: No pain to palpation with full mobility Oral cavity/oropharynx: No erythema or exudate, no lesions present Nasopharynx: No mass or lesion with intact mucosa Hypopharynx: Intact mucosa without pooling of secretions Larynx Glottic: Full true vocal cord mobility without lesion or mass, vocal fold atrophy and incomplete glottic closure with videostrobe Supraglottic: Normal appearing epiglottis and AE folds Interarytenoid Space: Moderate pachydermia edema Subglottic Space: Patent without lesion or edema Neck Neck and Trachea: Midline trachea without mass or lesion Thyroid: No mass or nodularity Lymphatics: No lymphadenopathy  Procedure: Summary of Video-Laryngeal-Stroboscopy: 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 All the risks, benefits, and potential complications were reviewed with the patient preoperatively and informed verbal consent was obtained.  Procedure: The patient was seated upright in the exam chair.   Topical lidocaine and Afrin were applied to the nasal cavity. After adequate anesthesia had occurred, the flexible telescope was passed into the nasal cavity. The nasopharynx was patent without mass or lesion. The scope was passed behind the soft palate and directed toward the base of tongue. The base of tongue was  visualized and was symmetric with no apparent masses or abnormal appearing tissue. There were no signs of a mass or pooling of secretions in the piriform sinuses. The supraglottic structures were normal.  The true vocal cords are mobile. The medial edges were bowed. Closure was incomplete. Periodicity present. The mucosal wave and amplitude were normal and symmetric bilaterally. There is moderate interarytenoid pachydermia and post cricoid edema. The mucosa appears without lesions.   The laryngoscope was then slowly withdrawn and the patient tolerated the procedure well. There were no complications or blood loss.        Studies Reviewed: MRI Brain 03/10/2023 FINDINGS: Brain: There is no evidence of an acute infarct, intracranial hemorrhage, mass, midline shift, or extra-axial fluid collection. Mild cerebral atrophy is within normal limits for age. T2 hyperintensities in the cerebral white matter are nonspecific but compatible with mild chronic small vessel ischemic disease. No abnormal enhancement is identified.   Vascular: Major intracranial vascular flow voids are preserved.   Skull and upper cervical spine: No suspicious marrow lesion.   Sinuses/Orbits: Unremarkable orbits. Extensive right greater than left ethmoid air cell mucosal thickening. Circumferential mucosal thickening and fluid resulting in near complete opacification of the left sphenoid sinus. Milder mucosal thickening in the right frontal and right sphenoid sinuses. Clear mastoid air cells.   Other: None.   IMPRESSION: 1. No acute intracranial abnormality or mass. 2. Mild chronic small vessel ischemic disease. 3. Extensive bilateral ethmoid and left sphenoid sinusitis.  Assessment/Plan: Encounter Diagnoses  Name Primary?   Chronic sinusitis, unspecified location Yes   Dysphonia    Chronic nasal congestion    Chronic GERD    Environmental and seasonal allergies    Post-nasal drip    Nasal congestion     Hypertrophy of both inferior nasal turbinates    Nasal septal deviation    Age-related vocal fold atrophy    Glottic insufficiency     Chronic persistent dysphonia with raspy voice and poor projection following intubation for surgery last summer and subsequent sinus infection 2 months after the surgery.  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  Chronic nasal congestion nasal obstruction history of seasonal allergies and evidence of bilateral ethmoid and sphenoid sinusitis on MRI brain last summer -CT BrainLab sinuses  to evaluate for persistent chronic sinus inflammation -Nasal saline rinses with NeilMed bottle -Continue Flonase 2 puffs bilateral nares twice daily and Allegra  3.  Chronic GERD LPR -wears CPAP for OSA and this could be one of the contributing factors to more significant GERD and nasal congestion  - trial of Pepcid 20 mg BID - trial of reflux gourmnet  - we discussed diet and lifestyle changes to minimize reflux  4.  Chronic nasal congestion nasal obstruction -evidence of septal deviation inferior turbinate hypertrophy on scope exam today -Medical management as above -Will consider septal ITR in the future if fails medical management  Thank you for allowing me to participate in the  care of this patient. Please do not hesitate to contact me with any questions or concerns.   Ashok Croon, MD Otolaryngology [facility] Phone: [phone] Fax: [phone]    05/09/2023, 10:34 AM

## 2023-05-09 NOTE — Patient Instructions (Addendum)
-   continue Flonase and Allegra - schedule CT sinuses  - start Pepcid - start Reflux Gourmet - schedule voice therapy  - Take Reflux Gourmet (natural supplement available on Amazon) to help with symptoms of chronic throat irritation     Lloyd Huger Med Nasal Saline Rinse   - start nasal saline rinses with NeilMed Bottle available over the counter or online to help with nasal congestion

## 2023-05-25 ENCOUNTER — Encounter: Payer: Self-pay | Admitting: Neurology

## 2023-05-25 NOTE — Addendum Note (Signed)
Addended by: Bertram Savin on: 05/25/2023 08:56 AM   Modules accepted: Orders

## 2023-05-31 NOTE — Telephone Encounter (Signed)
I called [facility] V[facility] at [facility] asking for call back to follow-up on pt's carotid ultrasound order that was placed 11/6.

## 2023-06-01 ENCOUNTER — Telehealth (INDEPENDENT_AMBULATORY_CARE_PROVIDER_SITE_OTHER): Payer: Self-pay | Admitting: Otolaryngology

## 2023-06-01 NOTE — Telephone Encounter (Signed)
[facility] called and said that they dont do the voice eval and that it needs to go to Neuro rehab

## 2023-06-02 ENCOUNTER — Other Ambulatory Visit: Payer: Self-pay | Admitting: Family Medicine

## 2023-06-02 DIAGNOSIS — E1142 Type 2 diabetes mellitus with diabetic polyneuropathy: Secondary | ICD-10-CM

## 2023-06-06 ENCOUNTER — Ambulatory Visit (HOSPITAL_COMMUNITY)
Admission: RE | Admit: 2023-06-06 | Discharge: 2023-06-06 | Disposition: A | Discharge status: Home / Self Care | Payer: Managed Care, Other (non HMO) | Source: Ambulatory Visit | Attending: Otolaryngology | Admitting: Otolaryngology

## 2023-06-06 DIAGNOSIS — J329 Chronic sinusitis, unspecified: Secondary | ICD-10-CM | POA: Insufficient documentation

## 2023-06-07 ENCOUNTER — Ambulatory Visit (HOSPITAL_COMMUNITY)
Admission: RE | Admit: 2023-06-07 | Discharge: 2023-06-07 | Disposition: A | Discharge status: Home / Self Care | Payer: Managed Care, Other (non HMO) | Source: Ambulatory Visit | Attending: Neurology | Admitting: Neurology

## 2023-06-07 DIAGNOSIS — H532 Diplopia: Secondary | ICD-10-CM | POA: Diagnosis not present

## 2023-06-08 ENCOUNTER — Telehealth: Payer: Self-pay | Admitting: *Deleted

## 2023-06-08 ENCOUNTER — Ambulatory Visit (INDEPENDENT_AMBULATORY_CARE_PROVIDER_SITE_OTHER): Payer: Managed Care, Other (non HMO) | Admitting: Neurology

## 2023-06-08 VITALS — BP 149/81 | HR 81 | Ht 68.0 in | Wt 289.2 lb

## 2023-06-08 DIAGNOSIS — H532 Diplopia: Secondary | ICD-10-CM

## 2023-06-08 DIAGNOSIS — G4733 Obstructive sleep apnea (adult) (pediatric): Secondary | ICD-10-CM | POA: Diagnosis not present

## 2023-06-08 NOTE — Progress Notes (Addendum)
Subjective:    [name] is a 62 y.o. male.  HPI    Interim history:   Brandon Rich is a 62 year old male with an underlying medical complex medical history of congestive heart failure, allergies, depression, diabetes, history of diabetic foot ulcer, palpitations, hypertension, hyperlipidemia, severe obstructive sleep apnea (on PAP therapy), and severe obesity with a BMI of over 40, who presents for follow-up consultation of his obstructive sleep apnea.  Brandon Rich is unaccompanied today.  I had evaluated Brandon Rich for obstructive sleep apnea in 2022, at which time Brandon Rich reported a prior diagnosis of OSA.  Brandon Rich was on PAP therapy.  Brandon Rich had a home sleep test through our office on 07/21/2021 which showed severe obstructive sleep apnea with a total AHI of 65.7/hour and O2 nadir of 80% with significant time below or at 88% saturation of 38.6 minutes.  Brandon Rich was advised to continue with PAP therapy.  I prescribed a new machine.  I recently evaluated Brandon Rich for double vision.  Brandon Rich had a recent carotid Doppler ultrasound which was benign.  Today, 06/08/2023: Brandon Rich reports full compliance with his machine.  Brandon Rich has been getting his supplies online.  Brandon Rich uses a nasal pillows interface.  Brandon Rich would like to get a new machine, AirSense 11.  Brandon Rich did not bring his PAP machine today.  Brandon Rich forgot it at home.  Brandon Rich would be willing to bring it back for download.  Brandon Rich reports that Brandon machine has shown Brandon Rich an error message that Brandon motor life has been exceeded.  Brandon Rich never did get a new machine where prescribed it in January 2023.   Brandon Rich works from home, Brandon Rich is semiretired.  Brandon Rich lives with his wife, bedtime around 9:30 PM or 10 PM and rise time around 7 AM.  Brandon Rich denies nightly nocturia and recurrent nocturnal morning headaches.  Brandon Rich limits his caffeine, drinks 1 cup of coffee in Brandon morning and otherwise decaf coffee and occasional soda.  Brandon Rich avoids drink caffeine in Brandon evening.  They have 2 grown children, 1 brand-new grandson, age 21 months  and they see [place] Rich almost every weekend in [place].  Currently do not have any pets.  Brandon Rich has had weight fluctuation, lost quite a bit of weight when Brandon Rich was with healthy weight and wellness but then had some foot surgeries and stopped going to [place] weight loss clinic.  Brandon Rich is planning to go back.  Brandon Rich is scheduled to see ophthalmology next month.  No significant change in his vision, no new symptoms.  No significant diplopia.  Addendum, 06/13/2023: I received his compliance report.  Brandon Rich used his AutoPap of 9 to 14 cm nightly between 05/09/2023 through 06/07/2023, residual AHI at goal at 1.2/h, average pressure for Brandon 95th percentile at 13.7 cm, leak on Brandon low side with Brandon 95th percentile at 0 L/min, average usage of 6 hours and 48 minutes.  Previously:   05/05/23: (Brandon Rich) reports intermittent double vision on side-to-side gaze since August 2024.  Symptoms were not sudden in onset as far as Brandon Rich recalls, no other weakness, no droopy eyelid or droopy face, no slurring of speech, no strokelike symptoms.  Brandon Rich has stable and no new foot pain and numbness in his feet.  Brandon Rich does endorse falling a few months ago.  Brandon Rich was in a hotel bathroom and fell as Brandon Rich turned in Brandon shower tub, thankfully no head injury but landed on his behind.  Brandon Rich does have some tendency to dehydrate.  Brandon Rich tries  to hydrate well with water.  Brandon Rich has appointment with his ophthalmologist, Brandon Rich did not make a sooner appointment, his routine follow-up appointment is approaching within Brandon next month.  Brandon Rich gets a full eye exam at least twice a year for his glaucoma and to check for diabetic retinopathy.  No history of diabetic retinopathy as far as Brandon Rich knows.  Brandon Rich also gets a visual field check at least once a year with his ophthalmologist.     Brandon Rich denies any significant headache. Brandon Rich drinks alcohol rarely.  Brandon Rich is a non-smoker.   I reviewed your office note from 03/30/2023.  At that time Brandon Rich had recently finished Augmentin for bilateral ethmoid and left sphenoid  sinusitis.  Brandon Rich reported improvement in his sense of taste and smell.  Brandon Rich had a brain MRI with and without contrast on 03/10/2023 and I reviewed Brandon results:      IMPRESSION: 1. No acute intracranial abnormality or mass. 2. Mild chronic small vessel ischemic disease. 3. Extensive bilateral ethmoid and left sphenoid sinusitis.   I do not see a recent A1c in his chart but Brandon Rich reports that a couple of months ago it was 7.1 per endocrinology, Brandon Rich sees his endocrinologist at [facility].  In November 2023 his A1c was 7.9, elevated.  I also reviewed your office note from 03/09/2023, at which time Brandon Rich reported a loss of sense of taste and smell for about 2 weeks.  Brandon Rich had tested for COVID at home which was negative.     [place]andon Rich presented to [place] emergency room on 03/18/2023 with hypoglycemia.  His blood sugar at home was 34.  Brandon Rich improved with oral glucose and glucagon. Of note, I had evaluated Brandon Rich for his obstructive sleep apnea in 2022.  Brandon Rich was on AutoPap therapy at Brandon time and needed a new machine.  Brandon Rich had a home sleep test through our office on 07/21/2021 which showed severe obstructive sleep apnea with a total AHI of 65.7/hour and O2 nadir of 80% with significant time below or at 88% saturation of 38.6 minutes.  I recommended ongoing treatment with PAP therapy and I wrote for a new AutoPap machine.  Brandon Rich did not return for a follow-up appointment.    06/18/2021: 62 year old right-handed gentleman with an underlying medical history of diabetes, glaucoma, hypertension, hyperlipidemia, LVH, palpitations, history of congestive heart failure, allergies, anxiety, degenerative cervical disc disease, depression, reflux disease, elevated liver enzymes, and obesity, who was previously diagnosed with obstructive sleep apnea and placed on positive airway pressure treatment.  Prior sleep study results are not available for my review today, testing was several years ago, around 2003.  Brandon Rich reports having had a sleep study at [facility].  I was able to review his compliance data from Brandon past 30 days.  Brandon Rich has been 100% compliance, average usage of 7 hours and 44 minutes, residual AHI at goal at 0.5/h, pressure range of 9 to 14 cm with EPR of 1, average pressure for Brandon 95th percentile at 13.2 cm, leak low, nearly absent.  Brandon Rich uses a P 10 nasal pillows interface and has done well with it, started off with a nasal mask some years ago.  Brandon Rich has not gone without treatment within Brandon past nearly 20 years for more than a handful of times.  This is his second machine and Brandon Rich purchased it out-of-pocket and gets his supplies online. Brandon Rich has been working on weight loss and has achieved a significant amount of weight reduction.  Brandon Rich has  not had a reevaluation of his sleep apnea in years or follow-up with a sleep specialist.  His Epworth sleepiness score is 2 out of 24, fatigue severity score is 26 out of 63.  Brandon Rich is going to have left foot surgery next month.  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  Brandon Rich is diabetes control has improved over time.  Bedtime is currently between 9 and 9:30 PM and rise time between 5 and 7 most of Brandon mornings.  Brandon Rich has been working from home, works as an Art gallery manager, on Human resources officer.  Brandon Rich is a non-smoker and drinks alcohol very rarely, caffeine in limitation, 1 cup of coffee generally once a day.  Brandon Rich lives with his wife, they have 1 dog in Brandon household, they have 2 grown children, ages 21 and 21.  As Brandon Rich recalls, his sleep apnea was severe at Brandon time, Brandon Rich remembers 40+ events per hour.  From his description, Brandon Rich had a split-night sleep study at Brandon time.  Brandon Rich would be willing to get reevaluated.  Brandon Rich is probably eligible for new machine as his current machine is about 9 to 63 years old.   His Past Medical History Is Significant For: Past Medical History:  Diagnosis Date   Acute combined systolic and diastolic heart failure (HCC) 02/16/2016   Allergy    Anxiety    Phreesia 12/27/2019   Anxiety     DDD (degenerative disc disease), cervical    Depression    Diabetes mellitus    Diabetes mellitus without complication (HCC)    Phreesia 12/27/2019   Diabetic foot ulcer (HCC)    FUO (fever of unknown origin) 02/16/2016   Glaucoma    Heartburn    Hx of adenomatous colonic polyps 08/09/2022   5 adenomas max 10 mm recall 2027   Hyperlipidemia    Hypertension    Hypertrophied anal papilla    LVH (left ventricular hypertrophy) 03/29/2016   Palpitation    Rash and nonspecific skin eruption 02/16/2016   Sleep apnea    CPAP   SOBOE (shortness of breath on exertion)    Transaminitis 02/16/2016    His Past Surgical History Is Significant For: Past Surgical History:  Procedure Laterality Date   AMPUTATION TOE Right 04/2020   AMPUTATION TOE Right 01/2020   AMPUTATION TOE Left    Big toe   HERNIA REPAIR     SPINE SURGERY     L4/L5   TUMOR REMOVAL  12/1998   Bone Cyst    His Family History Is Significant For: Family History  Problem Relation Age of Onset   Skin cancer Mother    Cancer Mother    Heart disease Mother        Atrial Fib   Hypertension Mother    Hyperlipidemia Mother    Obesity Mother    Cancer Father    Heart disease Father        Valve Disease   Heart disease Maternal Grandmother    Hyperlipidemia Maternal Grandmother    Hypertension Maternal Grandmother    Heart disease Maternal Grandfather    Heart disease Paternal Grandfather    Hypertension Paternal Grandfather    Hyperlipidemia Paternal Grandfather    Sleep apnea Neg Hx    Colon cancer Neg Hx    Esophageal cancer Neg Hx    Stomach cancer Neg Hx     His Social History Is Significant For: Social History   Socioeconomic History   Marital status:  Married    Spouse name: Boomer Dodaro   Number of children: 2   Years of education: 16   Highest education level: Bachelor's degree (e.g., BA, AB, BS)  Occupational History   Occupation: Education officer, museum  Tobacco Use   Smoking status: Never    Smokeless tobacco: Never  Vaping Use   Vaping status: Never Used  Substance and Sexual Activity   Alcohol use: No    Alcohol/week: 0.0 standard drinks of alcohol    Comment: per pt 1 drink once in a long while   Drug use: No   Sexual activity: Yes    Partners: Female  Other Topics Concern   Not on file  Social History Narrative   Married. Education: [employer].    Social Determinants of Health   Financial Resource Strain: Low Risk  (12/20/2022)   Overall Financial Resource Strain (CARDIA)    Difficulty of Paying Living Expenses: Not very hard  Food Insecurity: No Food Insecurity (12/20/2022)   Hunger Vital Sign    Worried About Running Out of Food in [place] Last Year: Never true    Ran Out of Food in [place] Last Year: Never true  Transportation Needs: No Transportation Needs (12/20/2022)   PRAPARE - Administrator, Civil Service (Medical): No    Lack of Transportation (Non-Medical): No  Physical Activity: Insufficiently Active (12/20/2022)   Exercise Vital Sign    Days of Exercise per Week: 1 day    Minutes of Exercise per Session: 20 min  Stress: No Stress Concern Present (12/20/2022)   [employer] of Occupational Health - Occupational Stress Questionnaire    Feeling of Stress : Only a little  Social Connections: Moderately Isolated (12/20/2022)   Social Connection and Isolation Panel [NHANES]    Frequency of Communication with Friends and Family: Twice a week    Frequency of Social Gatherings with Friends and Family: Once a week    Attends Religious Services: Never    Database administrator or Organizations: No    Attends Engineer, structural: Not on file    Marital Status: Married    His Allergies Are:  Allergies  Allergen Reactions   Other Other (See Comments)    Hayfever  :   His Current Medications Are:  Outpatient Encounter Medications as of 06/08/2023  Medication Sig   aspirin 81 MG tablet Take 81 mg by mouth daily.   atorvastatin (LIPITOR) 20  MG tablet Take 1 tablet (20 mg total) by mouth daily.   BD PEN NEEDLE NANO U/F 32G X 4 MM MISC    calcium carbonate (TUMS - DOSED IN MG ELEMENTAL CALCIUM) 500 MG chewable tablet Chew 2 tablets by mouth daily as needed for indigestion or heartburn.   dorzolamide-timolol (COSOPT) 22.3-6.8 MG/ML ophthalmic solution 1 drop 2 (two) times daily.   empagliflozin (JARDIANCE) 25 MG TABS tablet Take 1 tablet (25 mg total) by mouth daily.   famotidine (PEPCID) 20 MG tablet Take 1 tablet (20 mg total) by mouth 2 (two) times daily.   fexofenadine (ALLEGRA) 180 MG tablet Take 180 mg by mouth daily.   fluticasone (FLONASE) 50 MCG/ACT nasal spray PLACE 2 SPRAYS INTO BOTH NOSTRILS DAILY.   gabapentin (NEURONTIN) 300 MG capsule TAKE ONE CAPSULE BY MOUTH AT BEDTIME   hydrOXYzine (ATARAX) 25 MG tablet Take 0.5-1 tablets (12.5-25 mg total) by mouth at bedtime as needed for anxiety.   insulin glargine-yfgn (SEMGLEE, YFGN,) 100 UNIT/ML Pen Inject 50 Units into Brandon skin daily. (  Titrate as directed up to max daily dose of 100 units)   ipratropium (ATROVENT) 0.06 % nasal spray Place 1-2 sprays into both nostrils 4 (four) times daily. As needed for nasal congestion   lisinopril (ZESTRIL) 40 MG tablet TAKE ONE TABLET BY MOUTH ONE TIME DAILY   metFORMIN (GLUCOPHAGE) 1000 MG tablet TAKE ONE TABLET BY MOUTH TWICE A DAY   Multiple Vitamin (MULTIVITAMIN) tablet Take 1 tablet by mouth every evening.    sertraline (ZOLOFT) 100 MG tablet Take 1.5 tablets (150 mg total) by mouth daily.   sildenafil (VIAGRA) 100 MG tablet TAKE ONE-HALF TO ONE TABLET BY MOUTH ONE TIME DAILY AS NEEDED FOR ERECTILE DYSFUNCTION   tirzepatide (MOUNJARO) 15 MG/0.5ML Pen Inject 15 mg Subcutaneously once a week 90 days   Travoprost, BAK Free, (TRAVATAN) 0.004 % SOLN ophthalmic solution SMARTSIG:In Eye(s)   Vitamin D, Ergocalciferol, (DRISDOL) 1.25 MG (50000 UNIT) CAPS capsule Take 1 capsule (50,000 Units total) by mouth every 7 (seven) days.   [DISCONTINUED]  amoxicillin-clavulanate (AUGMENTIN) 875-125 MG tablet Take 1 tablet by mouth 2 (two) times daily. (Rich not taking: Reported on 05/09/2023)   Facility-Administered Encounter Medications as of 06/08/2023  Medication   0.9 %  sodium chloride infusion  :  Review of Systems:  Out of a complete 14 point review of systems, all are reviewed and negative with Brandon exception of these symptoms as listed below:   Review of Systems  Neurological:        Pt here for cpap follow up (last seen 2 yrs). ESS 5, using cpap every night.  Forgot machine. To bring back later for DL.  His machine saying motor exceeded.  Needs new machine.      Objective:  Neurological Exam  Physical Exam Physical Examination:   Vitals:   06/08/23 0741  BP: (!) 149/81  Pulse: 81    General Examination: Brandon Rich is a very pleasant 62 y.o. male in no acute distress. Brandon Rich appears well-developed and well-nourished and well groomed.   HEENT: Normocephalic, atraumatic, pupils are equal, round and reactive to light, no photophobia.  Corrective eyeglasses in place.  Extraocular tracking well-preserved.  Face is symmetric, airway examination reveals mild mouth dryness, mildly small airway, Mallampati class I, tonsils absent, neck circumference 20 and half inches.  Tongue protrudes centrally and palate elevates symmetrically.  Hearing grossly intact, no carotid bruits.  Neck with full range of motion.   Chest: Clear to auscultation without wheezing, rhonchi or crackles noted.   Heart: S1+S2+0, regular and normal without murmurs, rubs or gallops noted.    Abdomen: Soft, non-tender and non-distended.   Extremities: There is no pitting edema in Brandon distal lower extremities bilaterally.    Skin: Warm and dry without trophic changes noted.    Musculoskeletal: exam reveals no obvious joint deformities.    Neurologically:  Mental status: Brandon Rich is awake, alert and oriented in all 4 spheres. His immediate and remote  memory, attention, language skills and fund of knowledge are appropriate. There is no evidence of aphasia, agnosia, apraxia or anomia. Speech is clear with normal prosody and enunciation. Thought process is linear. Mood is normal and affect is normal.  Cranial nerves II - XII are as described above under HEENT exam.  Motor exam: Normal bulk, strength and tone is noted. There is no drift, postural or action or intention tremor, no rebound.   Fine motor skills and coordination: Grossly intact.   Cerebellar testing: No dysmetria or intention tremor. There is no  truncal or gait ataxia.  Sensory exam: intact to light touch in Brandon upper extremities. Gait, station and balance: Brandon Rich stands easily. No veering to one side is noted. No leaning to one side is noted. Posture is age-appropriate and stance is narrow based. Gait shows normal stride length and normal pace. No problems turning are noted.   Assessment and plan:  In summary, FERLANDO FARRAN is a very pleasant 62 year old male with an underlying medical complex medical history of congestive heart failure, allergies, depression, diabetes, history of diabetic foot ulcer, palpitations, hypertension, intermittent double vision, hyperlipidemia, severe obstructive sleep apnea (on PAP therapy), and severe obesity with a BMI of over 40, who presents for follow-up consultation of his obstructive sleep apnea.  Brandon Rich has been on PAP therapy for over 20 years, current machine in over 45 years old.  Brandon Rich had a home sleep test through our office on 07/21/2021 which showed severe obstructive sleep apnea with a total AHI of 65.7/hour and O2 nadir of 80% with significant time below or at 88% saturation of 38.6 minutes.  Brandon Rich was advised to continue with PAP therapy.  Brandon Rich never did get a new machine at Brandon time.  Brandon Rich has been compliant with his older AutoPap machine but it does show an error message now.  I would like to prescribe a new AutoPap machine for Brandon Rich.  We will send Brandon order to a  local DME provider and Brandon Rich is advised to continue to be consistent with treatment.  Brandon Rich is advised to make an appointment in sleep clinic routinely in about 3 months for a compliance check.  Brandon Rich is agreeable to bring his current machine for a download.  Brandon Rich is pending an ophthalmology appointment.  His recent carotid Doppler ultrasound was benign.  I answered all his questions today and Brandon Rich was in agreement with our plan.   I spent 40 minutes in total face-to-face time and in reviewing records during pre-charting, more than 50% of which was spent in counseling and coordination of care, reviewing test results, reviewing medications and treatment regimen and/or in discussing or reviewing Brandon diagnosis of OSA, Brandon prognosis and treatment options. Pertinent laboratory and imaging test results that were available during this visit with Brandon Rich were reviewed by me and considered in my medical decision making (see chart for details).

## 2023-06-08 NOTE — Telephone Encounter (Signed)
Orders faxed to [facility] this am .

## 2023-06-08 NOTE — Patient Instructions (Signed)
It was nice to see you again today.  As discussed, I will prescribe a new AutoPap machine for you.  You will have to make a follow-up appointment within 2 to 3 months after set up with your new machine at home.  We will send the order to the DME company and they should be in touch with you over the next couple of weeks.  If you do not hear back from anyone, you can always call us back or email [place] through MyChart.  Please swing by with your old CPAP machine or AutoPap machine that you are currently using so we can get a download of the machine.

## 2023-06-23 NOTE — Telephone Encounter (Signed)
I have messaged [name] w/ [facility] about this. Not sure that we've received any authorization letters for the patient but I will find out what [facility] needs.

## 2023-06-23 NOTE — Telephone Encounter (Signed)
[employer] (Nami)Need authorization letter faxed to [facility]

## 2023-06-27 NOTE — Telephone Encounter (Signed)
Zott, Linnell Fulling, Otilio Jefferson, RN; Bassett, Tammy [place], Rosann Auerbach does not require auth for cpap so there is nothing we need, but thank you for checking I appreciate it

## 2023-06-28 ENCOUNTER — Other Ambulatory Visit: Payer: Self-pay | Admitting: Family Medicine

## 2023-06-28 DIAGNOSIS — F411 Generalized anxiety disorder: Secondary | ICD-10-CM

## 2023-06-29 ENCOUNTER — Encounter: Payer: Managed Care, Other (non HMO) | Admitting: Family Medicine

## 2023-07-06 ENCOUNTER — Ambulatory Visit: Payer: Managed Care, Other (non HMO) | Admitting: Family Medicine

## 2023-07-06 ENCOUNTER — Encounter: Payer: Self-pay | Admitting: Family Medicine

## 2023-07-06 VITALS — BP 136/74 | HR 91 | Temp 99.3°F | Ht 68.25 in | Wt 286.6 lb

## 2023-07-06 DIAGNOSIS — F5104 Psychophysiologic insomnia: Secondary | ICD-10-CM

## 2023-07-06 DIAGNOSIS — Z7984 Long term (current) use of oral hypoglycemic drugs: Secondary | ICD-10-CM

## 2023-07-06 DIAGNOSIS — F4323 Adjustment disorder with mixed anxiety and depressed mood: Secondary | ICD-10-CM | POA: Diagnosis not present

## 2023-07-06 DIAGNOSIS — I10 Essential (primary) hypertension: Secondary | ICD-10-CM

## 2023-07-06 DIAGNOSIS — E119 Type 2 diabetes mellitus without complications: Secondary | ICD-10-CM

## 2023-07-06 DIAGNOSIS — E785 Hyperlipidemia, unspecified: Secondary | ICD-10-CM | POA: Diagnosis not present

## 2023-07-06 DIAGNOSIS — Z0000 Encounter for general adult medical examination without abnormal findings: Secondary | ICD-10-CM | POA: Diagnosis not present

## 2023-07-06 DIAGNOSIS — N529 Male erectile dysfunction, unspecified: Secondary | ICD-10-CM | POA: Diagnosis not present

## 2023-07-06 MED ORDER — HYDROXYZINE HCL 25 MG PO TABS: 12.5000 mg | ORAL_TABLET | Freq: Every evening | ORAL | 1 refills | Status: DC | PRN

## 2023-07-06 MED ORDER — EMPAGLIFLOZIN 25 MG PO TABS
25.0000 mg | ORAL_TABLET | Freq: Every day | ORAL | 3 refills | Status: DC
Start: 2023-07-06 — End: 2023-08-24

## 2023-07-06 MED ORDER — ATORVASTATIN CALCIUM 20 MG PO TABS
20.0000 mg | ORAL_TABLET | Freq: Every day | ORAL | 1 refills | Status: DC
Start: 2023-07-06 — End: 2024-01-06

## 2023-07-06 MED ORDER — METFORMIN HCL 1000 MG PO TABS
ORAL_TABLET | ORAL | 1 refills | Status: DC
Start: 2023-07-06 — End: 2024-01-06

## 2023-07-06 NOTE — Patient Instructions (Signed)
Thank you for coming today.  If there are any concerns on your labs I will let you know.  Keep follow-up with specialists as planned.  If any other refills needed please let me know.  Take care!  Preventive Care 63-63 Years Old, Male Preventive care refers to lifestyle choices and visits with your health care provider that can promote health and wellness. Preventive care visits are also called wellness exams. What can I expect for my preventive care visit? Counseling During your preventive care visit, your health care provider may ask about your: Medical history, including: Past medical problems. Family medical history. Current health, including: Emotional well-being. Home life and relationship well-being. Sexual activity. Lifestyle, including: Alcohol, nicotine or tobacco, and drug use. Access to firearms. Diet, exercise, and sleep habits. Safety issues such as seatbelt and bike helmet use. Sunscreen use. Work and work Astronomer. Physical exam Your health care provider will check your: Height and weight. These may be used to calculate your BMI (body mass index). BMI is a measurement that tells if you are at a healthy weight. Waist circumference. This measures the distance around your waistline. This measurement also tells if you are at a healthy weight and may help predict your risk of certain diseases, such as type 2 diabetes and high blood pressure. Heart rate and blood pressure. Body temperature. Skin for abnormal spots. What immunizations do I need?  Vaccines are usually given at various ages, according to a schedule. Your health care provider will recommend vaccines for you based on your age, medical history, and lifestyle or other factors, such as travel or where you work. What tests do I need? Screening Your health care provider may recommend screening tests for certain conditions. This may include: Lipid and cholesterol levels. Diabetes screening. This is done by  checking your blood sugar (glucose) after you have not eaten for a while (fasting). Hepatitis B test. Hepatitis C test. HIV (human immunodeficiency virus) test. STI (sexually transmitted infection) testing, if you are at risk. Lung cancer screening. Prostate cancer screening. Colorectal cancer screening. Talk with your health care provider about your test results, treatment options, and if necessary, the need for more tests. Follow these instructions at home: Eating and drinking  Eat a diet that includes fresh fruits and vegetables, whole grains, lean protein, and low-fat dairy products. Take vitamin and mineral supplements as recommended by your health care provider. Do not drink alcohol if your health care provider tells you not to drink. If you drink alcohol: Limit how much you have to 0-2 drinks a day. Know how much alcohol is in your drink. In the [place] one drink equals one 12 oz bottle of beer (355 mL), one 5 oz glass of wine (148 mL), or one 1 oz glass of hard liquor (44 mL). Lifestyle Brush your teeth every morning and night with fluoride toothpaste. Floss one time each day. Exercise for at least 30 minutes 5 or more days each week. Do not use any products that contain nicotine or tobacco. These products include cigarettes, chewing tobacco, and vaping devices, such as e-cigarettes. If you need help quitting, ask your health care provider. Do not use drugs. If you are sexually active, practice safe sex. Use a condom or other form of protection to prevent STIs. Take aspirin only as told by your health care provider. Make sure that you understand how much to take and what form to take. Work with your health care provider to find out whether it is safe and  beneficial for you to take aspirin daily. Find healthy ways to manage stress, such as: Meditation, yoga, or listening to music. Journaling. Talking to a trusted person. Spending time with friends and family. Minimize exposure to  UV radiation to reduce your risk of skin cancer. Safety Always wear your seat belt while driving or riding in a vehicle. Do not drive: If you have been drinking alcohol. Do not ride with someone who has been drinking. When you are tired or distracted. While texting. If you have been using any mind-altering substances or drugs. Wear a helmet and other protective equipment during sports activities. If you have firearms in your house, make sure you follow all gun safety procedures. What's next? Go to your health care provider once a year for an annual wellness visit. Ask your health care provider how often you should have your eyes and teeth checked. Stay up to date on all vaccines. This information is not intended to replace advice given to you by your health care provider. Make sure you discuss any questions you have with your health care provider. Document Revised: 12/31/2020 Document Reviewed: 12/31/2020 [employer]vier Patient Education  2024 [employer].

## 2023-07-06 NOTE — Progress Notes (Signed)
Subjective:  Patient ID: Brandon Rich, male    DOB: 07/30/1960  Age: 62 y.o. MRN: [id number]  CC:  Chief Complaint  Patient presents with   Annual Exam    Pt is doing well, no concerns at this time,     HPI Brandon Rich presents for Annual Exam - no acute concerns.  PCP, me Endocrinology, Dr. Sharl Ma, diabetes with polyneuropathy, history of toe amputation, insulin-dependent.  On gabapentin for neuropathy Sleep specialist/neuro, Dr. Frances Furbish -OSA on CPAP, appointment in November.  ENT, Dr. Irene Pap, chronic sinusitis, eval in October.  Continued on Flonase, Allegra, CT sinuses ordered, started on Pepcid, reflux Gourmet supplement and scheduled voice therapy. Improved symptoms. Did not see voice therapy.  Orthopedics, foot and ankle, Dr. Lorin Picket Ophthalmology, Dr. Rolly Salter at [facility], glaucoma, cataract.  Appointment December 10. Cardiology, Dr. Antoine Poche Dermatology, Dr. Donzetta Starch.  Urology, Dr. Laverle Patter, hx of elevated PSA, recent level was ok - appt in Spring.  Hand - Dr. Amanda Pea for prior trigger finger.  Ortho - Dr. Drucie Ip - for back pain  Hypertension: With OSA on CPAP.  Lisinopril 40 mg daily, cardiologist Dr. Antoine Poche. No new side effects.  Home readings: rarely - 125-135/75.  BP Readings from Last 3 Encounters:  07/06/23 136/74  06/08/23 (!) 149/81  05/09/23 (!) 150/81   Lab Results  Component Value Date   CREATININE 1.07 03/18/2023   Anxiety with insomnia Treated with sertraline 150 mg daily, and is followed by therapist.  Hydroxyzine and gabapentin have been helpful with sleep previously - still working well.   Hyperlipidemia: Lipitor 20 mg daily without new myalgias/side effects.  Lab Results  Component Value Date   CHOL 115 12/28/2022   HDL 31.60 (L) 12/28/2022   LDLCALC 51 12/28/2022   LDLDIRECT 87.0 07/02/2022   TRIG 161.0 (H) 12/28/2022   CHOLHDL 4 12/28/2022   Lab Results  Component Value Date   ALT 21 12/28/2022   AST 42 (H) 12/28/2022   ALKPHOS 64  12/28/2022   BILITOT 0.4 12/28/2022   Erectile dysfunction Treated with Viagra 100 mg, some headache and nasal congestion with use of medication previously - same,  but no hearing or vision changes acutely or chest pain/dyspnea with exertion or intercourse. Working ok.      01/17/2023    4:20 PM 12/23/2022    9:19 AM 07/02/2022    8:07 AM 01/01/2022    8:49 AM 07/03/2021    8:00 AM  Depression screen PHQ 2/9  Decreased Interest 0 0 0 0 0  Down, Depressed, Hopeless 0 0 0 1 0  PHQ - 2 Score 0 0 0 1 0  Altered sleeping 0 1 1 1  0  Tired, decreased energy 1 1 1  0 0  Change in appetite 0 0 1 0 0  Feeling bad or failure about yourself  0 0 0 0 0  Trouble concentrating 0 0 0 0 0  Moving slowly or fidgety/restless 0 0 0 0 0  Suicidal thoughts 0 0 0 0 0  PHQ-9 Score 1 2 3 2  0  Difficult doing work/chores Not difficult at all    Not difficult at all    Health Maintenance  Topic Date Due   Fecal DNA (Cologuard)  12/13/2021   HEMOGLOBIN A1C  12/16/2022   FOOT EXAM  01/02/2023   COVID-19 Vaccine (6 - 2024-25 season) 03/20/2023   Diabetic kidney evaluation - Urine ACR  07/03/2023   OPHTHALMOLOGY EXAM  12/14/2023   Diabetic kidney evaluation -  eGFR measurement  03/17/2024   DTaP/Tdap/Td (2 - Td or Tdap) 12/17/2026   INFLUENZA VACCINE  Completed   Hepatitis C Screening  Completed   HIV Screening  Completed   Zoster Vaccines- Shingrix  Completed   HPV VACCINES  Aged Out  Colonoscopy - 07/27/22.  Prostate: followed by urology - Dr. Laverle Patter   Immunization History  Administered Date(s) Administered   Influenza Split 04/04/2012, 04/14/2013, 03/25/2019   Influenza Whole 09/05/2017   Influenza,inj,Quad PF,6+ Mos 04/14/2013, 04/24/2014, 06/16/2015, 03/27/2016, 04/16/2017, 04/10/2018, 04/09/2022   Influenza-Unspecified 04/04/2012, 04/14/2013, 04/24/2014, 06/16/2015, 03/27/2016, 04/16/2017, 04/10/2018, 03/25/2019, 04/03/2021, 05/03/2023   PFIZER(Purple Top)SARS-COV-2 Vaccination 10/13/2019,  11/06/2019, 03/18/2020, 01/09/2021, 07/13/2021   Pneumococcal Conjugate-13 06/16/2015   Pneumococcal Polysaccharide-23 07/19/2006, 06/06/2014   Respiratory Syncytial Virus Vaccine,Recomb Aduvanted(Arexvy) 07/02/2022   Tdap 12/16/2016   Zoster Recombinant(Shingrix) 12/16/2018, 03/25/2019   Zoster, Unspecified 12/16/2018, 03/25/2019  Covid 19 booster and flu vaccine mid October at [facility].   No results found. Recent optho visit as above.   Dental: every 4 months.   Alcohol:rare  Tobacco: none  Exercise: minimal. Has exercise bike.  Wt Readings from Last 3 Encounters:  07/06/23 286 lb 9.6 oz (130 kg)  06/08/23 289 lb 3.2 oz (131.2 kg)  05/09/23 292 lb (132.5 kg)      History Patient Active Problem List   Diagnosis Date Noted   Class 3 severe obesity with serious comorbidity and body mass index (BMI) of 45.0 to 49.9 in adult (HCC) 08/16/2022   Hx of adenomatous colonic polyps 08/09/2022   Type 2 diabetes mellitus with obesity (HCC) 07/05/2022   At risk for hyperglycemia 03/15/2022   Polyphagia 08/26/2021   Acquired hallux rigidus of left foot 04/14/2021   Nonalcoholic hepatosteatosis 12/22/2020   Other fatigue 12/04/2020   SOBOE (shortness of breath on exertion) 12/04/2020   Vitamin D deficiency 12/04/2020   Depression 12/04/2020   Acquired claw toe of right foot 04/01/2020   Class 3 severe obesity due to excess calories with serious comorbidity and body mass index (BMI) of 40.0 to 44.9 in adult (HCC) 09/10/2019   Chest discomfort 02/02/2019   Morbid obesity (HCC) 02/02/2019   Cortical age-related cataract of both eyes 03/30/2017   Nuclear sclerotic cataract of both eyes 03/30/2017   Primary open angle glaucoma (POAG) of both eyes, indeterminate stage 03/30/2017   Type 2 diabetes mellitus with foot ulcer, with long-term current use of insulin (HCC) 03/30/2017   LVH (left ventricular hypertrophy) 03/29/2016   T wave inversion in EKG 02/16/2016   Transaminitis 02/16/2016    Pain of left great toe 08/23/2013   Hypertension associated with type 2 diabetes mellitus (HCC) 12/20/2011   Diabetes mellitus (HCC) 12/20/2011   Hyperlipidemia associated with type 2 diabetes mellitus (HCC) 12/20/2011   Glaucoma (increased eye pressure) 07/19/1996   Past Medical History:  Diagnosis Date   Acute combined systolic and diastolic heart failure (HCC) 02/16/2016   Allergy    Anxiety    Phreesia 12/27/2019   Anxiety    DDD (degenerative disc disease), cervical    Depression    Diabetes mellitus    Diabetes mellitus without complication (HCC)    Phreesia 12/27/2019   Diabetic foot ulcer (HCC)    FUO (fever of unknown origin) 02/16/2016   Glaucoma    Heartburn    Hx of adenomatous colonic polyps 08/09/2022   5 adenomas max 10 mm recall 2027   Hyperlipidemia    Hypertension    Hypertrophied anal papilla    LVH (left  ventricular hypertrophy) 03/29/2016   Palpitation    Rash and nonspecific skin eruption 02/16/2016   Sleep apnea    CPAP   SOBOE (shortness of breath on exertion)    Transaminitis 02/16/2016   Past Surgical History:  Procedure Laterality Date   AMPUTATION TOE Right 04/2020   AMPUTATION TOE Right 01/2020   AMPUTATION TOE Left    Big toe   HERNIA REPAIR     SPINE SURGERY     L4/L5   TUMOR REMOVAL  12/1998   Bone Cyst   Allergies  Allergen Reactions   Other Other (See Comments)    Hayfever   Prior to Admission medications   Medication Sig Start Date End Date Taking? Authorizing Provider  aspirin 81 MG tablet Take 81 mg by mouth daily.   Yes [provider]  atorvastatin (LIPITOR) 20 MG tablet Take 1 tablet (20 mg total) by mouth daily. 12/23/22  Yes Shade Flood, MD  BD PEN NEEDLE NANO U/F 32G X 4 MM MISC  07/04/18  Yes [provider]  calcium carbonate (TUMS - DOSED IN MG ELEMENTAL CALCIUM) 500 MG chewable tablet Chew 2 tablets by mouth daily as needed for indigestion or heartburn.   Yes [provider]   dorzolamide-timolol (COSOPT) 22.3-6.8 MG/ML ophthalmic solution 1 drop 2 (two) times daily.   Yes [provider]  empagliflozin (JARDIANCE) 25 MG TABS tablet Take 1 tablet (25 mg total) by mouth daily. 12/23/22  Yes Shade Flood, MD  famotidine (PEPCID) 20 MG tablet Take 1 tablet (20 mg total) by mouth 2 (two) times daily. 05/09/23  Yes Ashok Croon, MD  fexofenadine (ALLEGRA) 180 MG tablet Take 180 mg by mouth daily.   Yes [provider]  fluticasone (FLONASE) 50 MCG/ACT nasal spray PLACE 2 SPRAYS INTO BOTH NOSTRILS DAILY. 07/09/14  Yes GuestAshley Jacobs, MD  gabapentin (NEURONTIN) 300 MG capsule TAKE ONE CAPSULE BY MOUTH AT BEDTIME 06/03/23  Yes Shade Flood, MD  hydrOXYzine (ATARAX) 25 MG tablet Take 0.5-1 tablets (12.5-25 mg total) by mouth at bedtime as needed for anxiety. 12/23/22  Yes Shade Flood, MD  insulin glargine-yfgn (SEMGLEE, YFGN,) 100 UNIT/ML Pen Inject 50 Units into the skin daily. (Titrate as directed up to max daily dose of 100 units) 07/20/22  Yes [provider]  ipratropium (ATROVENT) 0.06 % nasal spray Place 1-2 sprays into both nostrils 4 (four) times daily. As needed for nasal congestion 03/30/23  Yes Shade Flood, MD  lisinopril (ZESTRIL) 40 MG tablet TAKE ONE TABLET BY MOUTH ONE TIME DAILY 05/02/23  Yes Shade Flood, MD  metFORMIN (GLUCOPHAGE) 1000 MG tablet TAKE ONE TABLET BY MOUTH TWICE A DAY 12/23/22  Yes Shade Flood, MD  Multiple Vitamin (MULTIVITAMIN) tablet Take 1 tablet by mouth every evening.    Yes [provider]  sertraline (ZOLOFT) 100 MG tablet TAKE ONE AND ONE-HALF TABLETS BY MOUTH ONE TIME DAILY 06/29/23  Yes Shade Flood, MD  sildenafil (VIAGRA) 100 MG tablet TAKE ONE-HALF TO ONE TABLET BY MOUTH ONE TIME DAILY AS NEEDED FOR ERECTILE DYSFUNCTION 03/07/23  Yes Shade Flood, MD  tirzepatide [facility]) 15 MG/0.5ML Pen Inject 15 mg Subcutaneously once a week 90 days 06/17/22  Yes    Travoprost, BAK Free, (TRAVATAN) 0.004 % SOLN ophthalmic solution SMARTSIG:In Eye(s) 01/28/21  Yes [provider]  Vitamin D, Ergocalciferol, (DRISDOL) 1.25 MG (50000 UNIT) CAPS capsule Take 1 capsule (50,000 Units total) by mouth every 7 (seven)  days. 09/13/22  Yes Whitmire, Dawn, FNP   Social History   Socioeconomic History   Marital status: Married    Spouse name: Laquincy Bry   Number of children: 2   Years of education: 16   Highest education level: Bachelor's degree (e.g., BA, AB, BS)  Occupational History   Occupation: Education officer, museum  Tobacco Use   Smoking status: Never   Smokeless tobacco: Never  Vaping Use   Vaping status: Never Used  Substance and Sexual Activity   Alcohol use: No    Alcohol/week: 0.0 standard drinks of alcohol    Comment: per pt 1 drink once in a long while   Drug use: No   Sexual activity: Yes    Partners: Female  Other Topics Concern   Not on file  Social History Narrative   Married. Education: [employer].    Social Drivers of Corporate investment banker Strain: Low Risk  (12/20/2022)   Overall Financial Resource Strain (CARDIA)    Difficulty of Paying Living Expenses: Not very hard  Food Insecurity: No Food Insecurity (12/20/2022)   Hunger Vital Sign    Worried About Running Out of Food in the Last Year: Never true    Ran Out of Food in the Last Year: Never true  Transportation Needs: No Transportation Needs (12/20/2022)   PRAPARE - Administrator, Civil Service (Medical): No    Lack of Transportation (Non-Medical): No  Physical Activity: Insufficiently Active (12/20/2022)   Exercise Vital Sign    Days of Exercise per Week: 1 day    Minutes of Exercise per Session: 20 min  Stress: No Stress Concern Present (12/20/2022)   [employer] of Occupational Health - Occupational Stress Questionnaire    Feeling of Stress : Only a little  Social Connections: Moderately Isolated (12/20/2022)   Social Connection and Isolation  Panel [NHANES]    Frequency of Communication with Friends and Family: Twice a week    Frequency of Social Gatherings with Friends and Family: Once a week    Attends Religious Services: Never    Database administrator or Organizations: No    Attends Engineer, structural: Not on file    Marital Status: Married  Catering manager Violence: Not on file    Review of Systems 13 point review of systems per patient health survey noted.  Negative other than as indicated above or in HPI.    Objective:   Vitals:   07/06/23 1523  BP: 136/74  Pulse: 91  Temp: 99.3 F (37.4 C)  TempSrc: Temporal  SpO2: 97%  Weight: 286 lb 9.6 oz (130 kg)  Height: 5' 8.25" (1.734 m)     Physical Exam Vitals reviewed.  Constitutional:      Appearance: He is well-developed.  HENT:     Head: Normocephalic and atraumatic.     Right Ear: External ear normal.     Left Ear: External ear normal.  Eyes:     Conjunctiva/sclera: Conjunctivae normal.     Pupils: Pupils are equal, round, and reactive to light.  Neck:     Thyroid: No thyromegaly.  Cardiovascular:     Rate and Rhythm: Normal rate and regular rhythm.     Heart sounds: Normal heart sounds.  Pulmonary:     Effort: Pulmonary effort is normal. No respiratory distress.     Breath sounds: Normal breath sounds. No wheezing.  Abdominal:     General: There is no distension.     Palpations:  Abdomen is soft.     Tenderness: There is no abdominal tenderness.  Musculoskeletal:        General: No tenderness. Normal range of motion.     Cervical back: Normal range of motion and neck supple.  Lymphadenopathy:     Cervical: No cervical adenopathy.  Skin:    General: Skin is warm and dry.  Neurological:     Mental Status: He is alert and oriented to person, place, and time.     Deep Tendon Reflexes: Reflexes are normal and symmetric.  Psychiatric:        Behavior: Behavior normal.        Assessment & Plan:  Brandon Rich is a 62  y.o. male . Annual physical exam  - -anticipatory guidance as below in AVS, screening labs above. Health maintenance items as above in HPI discussed/recommended as applicable.   Essential hypertension - Plan: CBC with Differential/Platelet, atorvastatin (LIPITOR) 20 MG tablet, metFORMIN (GLUCOPHAGE) 1000 MG tablet  -Hypertension stable, continue same regimen.  Hyperlipidemia, unspecified hyperlipidemia type - Plan: Comprehensive metabolic panel, Lipid panel, atorvastatin (LIPITOR) 20 MG tablet  -Tolerating current dose statin, continue same  Adjustment disorder with mixed anxiety and depressed mood - Plan: hydrOXYzine (ATARAX) 25 MG tablet Psychophysiological insomnia - Plan: hydrOXYzine (ATARAX) 25 MG tablet  -Stable with current regimen, therapy, continue hydroxyzine, refilled.  Continue Zoloft same dose.  Type 2 diabetes mellitus without complication, without long-term current use of insulin (HCC) - Plan: empagliflozin (JARDIANCE) 25 MG TABS tablet  -Tolerating current med regimen, Jardiance refilled.  Urine microalbumin ordered.  Erectile dysfunction, unspecified erectile dysfunction type - Plan: Microalbumin / creatinine urine ratio  -Stable with use of Viagra, continue same.  Meds ordered this encounter  Medications   hydrOXYzine (ATARAX) 25 MG tablet    Sig: Take 0.5-1 tablets (12.5-25 mg total) by mouth at bedtime as needed for anxiety.    Dispense:  90 tablet    Refill:  1   atorvastatin (LIPITOR) 20 MG tablet    Sig: Take 1 tablet (20 mg total) by mouth daily.    Dispense:  90 tablet    Refill:  1   metFORMIN (GLUCOPHAGE) 1000 MG tablet    Sig: TAKE ONE TABLET BY MOUTH TWICE A DAY    Dispense:  180 tablet    Refill:  1   empagliflozin (JARDIANCE) 25 MG TABS tablet    Sig: Take 1 tablet (25 mg total) by mouth daily.    Dispense:  30 tablet    Refill:  3   Patient Instructions   Thank you for coming today.  If there are any concerns on your labs I will let you know.   Keep follow-up with specialists as planned.  If any other refills needed please let me know.  Take care!  Preventive Care 29-43 Years Old, Male Preventive care refers to lifestyle choices and visits with your health care provider that can promote health and wellness. Preventive care visits are also called wellness exams. What can I expect for my preventive care visit? Counseling During your preventive care visit, your health care provider may ask about your: Medical history, including: Past medical problems. Family medical history. Current health, including: Emotional well-being. Home life and relationship well-being. Sexual activity. Lifestyle, including: Alcohol, nicotine or tobacco, and drug use. Access to firearms. Diet, exercise, and sleep habits. Safety issues such as seatbelt and bike helmet use. Sunscreen use. Work and work Astronomer. Physical exam Your health care provider will  check your: Height and weight. These may be used to calculate your BMI (body mass index). BMI is a measurement that tells if you are at a healthy weight. Waist circumference. This measures the distance around your waistline. This measurement also tells if you are at a healthy weight and may help predict your risk of certain diseases, such as type 2 diabetes and high blood pressure. Heart rate and blood pressure. Body temperature. Skin for abnormal spots. What immunizations do I need?  Vaccines are usually given at various ages, according to a schedule. Your health care provider will recommend vaccines for you based on your age, medical history, and lifestyle or other factors, such as travel or where you work. What tests do I need? Screening Your health care provider may recommend screening tests for certain conditions. This may include: Lipid and cholesterol levels. Diabetes screening. This is done by checking your blood sugar (glucose) after you have not eaten for a while (fasting). Hepatitis B  test. Hepatitis C test. HIV (human immunodeficiency virus) test. STI (sexually transmitted infection) testing, if you are at risk. Lung cancer screening. Prostate cancer screening. Colorectal cancer screening. Talk with your health care provider about your test results, treatment options, and if necessary, the need for more tests. Follow these instructions at home: Eating and drinking  Eat a diet that includes fresh fruits and vegetables, whole grains, lean protein, and low-fat dairy products. Take vitamin and mineral supplements as recommended by your health care provider. Do not drink alcohol if your health care provider tells you not to drink. If you drink alcohol: Limit how much you have to 0-2 drinks a day. Know how much alcohol is in your drink. In the [place] one drink equals one 12 oz bottle of beer (355 mL), one 5 oz glass of wine (148 mL), or one 1 oz glass of hard liquor (44 mL). Lifestyle Brush your teeth every morning and night with fluoride toothpaste. Floss one time each day. Exercise for at least 30 minutes 5 or more days each week. Do not use any products that contain nicotine or tobacco. These products include cigarettes, chewing tobacco, and vaping devices, such as e-cigarettes. If you need help quitting, ask your health care provider. Do not use drugs. If you are sexually active, practice safe sex. Use a condom or other form of protection to prevent STIs. Take aspirin only as told by your health care provider. Make sure that you understand how much to take and what form to take. Work with your health care provider to find out whether it is safe and beneficial for you to take aspirin daily. Find healthy ways to manage stress, such as: Meditation, yoga, or listening to music. Journaling. Talking to a trusted person. Spending time with friends and family. Minimize exposure to UV radiation to reduce your risk of skin cancer. Safety Always wear your seat belt while  driving or riding in a vehicle. Do not drive: If you have been drinking alcohol. Do not ride with someone who has been drinking. When you are tired or distracted. While texting. If you have been using any mind-altering substances or drugs. Wear a helmet and other protective equipment during sports activities. If you have firearms in your house, make sure you follow all gun safety procedures. What's next? Go to your health care provider once a year for an annual wellness visit. Ask your health care provider how often you should have your eyes and teeth checked. Stay up to date on  all vaccines. This information is not intended to replace advice given to you by your health care provider. Make sure you discuss any questions you have with your health care provider. Document Revised: 12/31/2020 Document Reviewed: 12/31/2020 Elsevier Patient Education  2024 Elsevier Inc.      Signed,   Meredith Staggers, MD [facility], [facility] 07/06/23 3:28 PM

## 2023-07-07 LAB — COMPREHENSIVE METABOLIC PANEL
ALT: 19 U/L (ref 0–53)
AST: 39 U/L — ABNORMAL HIGH (ref 0–37)
Albumin: 4.7 g/dL (ref 3.5–5.2)
Alkaline Phosphatase: 68 U/L (ref 39–117)
BUN: 16 mg/dL (ref 6–23)
CO2: 24 meq/L (ref 19–32)
Calcium: 9.5 mg/dL (ref 8.4–10.5)
Chloride: 105 meq/L (ref 96–112)
Creatinine, Ser: 1 mg/dL (ref 0.40–1.50)
GFR: 80.09 mL/min (ref >60.00)
Glucose, Bld: 156 mg/dL — ABNORMAL HIGH (ref 70–99)
Potassium: 4.2 meq/L (ref 3.5–5.1)
Sodium: 139 meq/L (ref 135–145)
Total Bilirubin: 0.4 mg/dL (ref 0.2–1.2)
Total Protein: 7.6 g/dL (ref 6.0–8.3)

## 2023-07-07 LAB — CBC WITH DIFFERENTIAL/PLATELET
Basophils Absolute: 0.1 10*3/uL (ref 0.0–0.1)
Basophils Relative: 1 % (ref 0.0–3.0)
Eosinophils Absolute: 0.5 10*3/uL (ref 0.0–0.7)
Eosinophils Relative: 4.7 % (ref 0.0–5.0)
HCT: 45.6 % (ref 39.0–52.0)
Hemoglobin: 14.8 g/dL (ref 13.0–17.0)
Lymphocytes Relative: 27.9 % (ref 12.0–46.0)
Lymphs Abs: 2.7 10*3/uL (ref 0.7–4.0)
MCHC: 32.5 g/dL (ref 30.0–36.0)
MCV: 81.7 fL (ref 78.0–100.0)
Monocytes Absolute: 0.8 10*3/uL (ref 0.1–1.0)
Monocytes Relative: 8.2 % (ref 3.0–12.0)
Neutro Abs: 5.7 10*3/uL (ref 1.4–7.7)
Neutrophils Relative %: 58.2 % (ref 43.0–77.0)
Platelets: 288 10*3/uL (ref 150.0–400.0)
RBC: 5.58 Mil/uL (ref 4.22–5.81)
RDW: 15.8 % — ABNORMAL HIGH (ref 11.5–15.5)
WBC: 9.7 10*3/uL (ref 4.0–10.5)

## 2023-07-07 LAB — LIPID PANEL
Cholesterol: 128 mg/dL (ref 0–200)
HDL: 28.5 mg/dL — ABNORMAL LOW (ref >39.00)
LDL Cholesterol: 45 mg/dL (ref 0–99)
NonHDL: 99.29
Total CHOL/HDL Ratio: 4
Triglycerides: 269 mg/dL — ABNORMAL HIGH (ref 0.0–149.0)
VLDL: 53.8 mg/dL — ABNORMAL HIGH (ref 0.0–40.0)

## 2023-07-07 LAB — MICROALBUMIN / CREATININE URINE RATIO
Creatinine,U: 75.5 mg/dL
Microalb Creat Ratio: 14.4 mg/g (ref 0.0–30.0)
Microalb, Ur: 10.9 mg/dL — ABNORMAL HIGH (ref 0.0–1.9)

## 2023-07-09 ENCOUNTER — Encounter: Payer: Self-pay | Admitting: Family Medicine

## 2023-07-11 ENCOUNTER — Encounter (INDEPENDENT_AMBULATORY_CARE_PROVIDER_SITE_OTHER): Payer: Self-pay | Admitting: Otolaryngology

## 2023-07-11 ENCOUNTER — Ambulatory Visit (INDEPENDENT_AMBULATORY_CARE_PROVIDER_SITE_OTHER): Payer: Managed Care, Other (non HMO) | Admitting: Otolaryngology

## 2023-07-11 VITALS — BP 138/81 | HR 98

## 2023-07-11 DIAGNOSIS — J3089 Other allergic rhinitis: Secondary | ICD-10-CM

## 2023-07-11 DIAGNOSIS — K219 Gastro-esophageal reflux disease without esophagitis: Secondary | ICD-10-CM | POA: Diagnosis not present

## 2023-07-11 DIAGNOSIS — J383 Other diseases of vocal cords: Secondary | ICD-10-CM

## 2023-07-11 DIAGNOSIS — J342 Deviated nasal septum: Secondary | ICD-10-CM | POA: Diagnosis not present

## 2023-07-11 DIAGNOSIS — J343 Hypertrophy of nasal turbinates: Secondary | ICD-10-CM

## 2023-07-11 DIAGNOSIS — R0981 Nasal congestion: Secondary | ICD-10-CM

## 2023-07-11 DIAGNOSIS — R490 Dysphonia: Secondary | ICD-10-CM

## 2023-07-11 DIAGNOSIS — R0982 Postnasal drip: Secondary | ICD-10-CM

## 2023-07-11 DIAGNOSIS — G4733 Obstructive sleep apnea (adult) (pediatric): Secondary | ICD-10-CM

## 2023-07-11 NOTE — Patient Instructions (Signed)
 [email or web address] - check out this website to learn more about reflux   -Avoid lying down for at least two hours after a meal or after drinking acidic beverages, like soda, or other caffeinated beverages. This can help to prevent stomach contents from flowing back into the esophagus. -Keep your head elevated while you sleep. Using an extra pillow or two can also help to prevent reflux. -Eat smaller and more frequent meals each day instead of a few large meals. This promotes digestion and can aid in preventing heartburn. -Wear loose-fitting clothes to ease pressure on the stomach, which can worsen heartburn and reflux. -Reduce excess weight around the midsection. This can ease pressure on the stomach. Such pressure can force some stomach contents back up the esophagus

## 2023-07-11 NOTE — Progress Notes (Signed)
ENT Progress Note:  Update 07/11/23 Discussed the use of AI scribe software for clinical note transcription with the patient, who gave verbal consent to proceed.  History of Present Illness   The patient is a 62 yoM, with a history of chronic nasal congestion/post-nasal drainge and voice issues, reports significant improvement in their symptoms. They have been adhering to a regimen of saline rinses, famotidine 20 mg BID, and Flonase, which has led to a decrease in congestion, postnasal drainage, and coughing. The patient also notes an improvement in their voice, although it occasionally sounds gravelly, which they attribute to environmental humidity.  The patient has been attempting to schedule voice therapy but has been unsuccessful. However, they report that their voice has returned to normal in the interim and are no longer interested in pursuing therapy.  The patient also has a history of reflux, for which they have been taking famotidine 20 mg twice daily. They have been following dietary and lifestyle changes to manage their reflux symptoms.  The patient uses a CPAP machine for sleep apnea and has been doing so for over twenty years without any issues. They recently received a new machine that adjusts the pressure as needed.  The patient also reports a tendency towards dehydration, which they manage by drinking diluted lemonade and water throughout the day. They avoid caffeinated beverages and soda.  The patient has a history of chronic nasal congestion, which has improved according to recent CT scan results. The scan also revealed a septal deviation, but the patient does not feel it is causing any issues and is not considering surgery at this time.     Initial Consult Note  Reason for Consult: Chronic nasal congestion symptoms of nasal obstruction persistent cough and persistent chronic dysphonia raspy voice  HPI: Brandon SCHWANKE is an 62 y.o. male with hx of OSA on CPAP dialy, hx of  T2DM, here for persistent raspy voice and cough in the setting of chronic nasal obstruction.  He had foot surgery in June 2024 and intubation. Had a lot of throat irritation lasting x 2 months, and lots of coughing and had voice changes with deeper voice following the surgery.  He had significant coughing at the time but slowly his symptoms improved. He still has coughing in am, coughs up small amount of mucus. Then in August, 2024, he lost his sense of taste and smell. He had a round of Augmenting and his nasal congestion started to clear.  His PCP ordered MRI brain due to loss of taste and smell which showed mucosal thickening in the sinuses.  His taste and smell returned after nasal congestion improved. He has persistent cough and raspy voice since his sinus infection in August. He denies heartburn. He takes Scientist, research (medical) as needed. His sense of smell is back to baseline now.  He never had sinus or nasal surgery. He has seasonal allergies, worse in the Spring and end of the Summer. He is on Flonase and Allegra daily year around. Never been tested for allergies.  Originally from [place].   Records Reviewed:  Office visit with Meredith Staggers, MD family medicine 03/30/23 Loss of taste, smell Follow-up from August 21 visit.  COVID-19 testing had been negative, MRI brain Ordered with report of diplopia on exam.  Reassuringly he had no acute intracranial abnormality or mass and mild chronic small vessel ischemic disease.  There was extensive bilateral ethmoid and left sphenoid sinusitis.  ENT referral had initially been ordered but was treated with a course  of Augmentin for possible sinusitis contributor. Finished Augmentin last Friday, reports some improvement in symptoms and is starting to regain his sense of smell. Helped significantly. Now able to taste salt and some bitter, sweet and various foods. 50% better on smell. Minimal nasal congestion. Uses flonase 2spr/nostril every day.  ENT appt in December.  Optho appt  next month. No diplopia with his usual activities cessation screenshot of    Past Medical History:  Diagnosis Date   Acute combined systolic and diastolic heart failure (HCC) 02/16/2016   Allergy    Anxiety    [employer]reesia 12/27/2019   Anxiety    DDD (degenerative disc disease), cervical    Depression    Diabetes mellitus    Diabetes mellitus without complication (HCC)    [employer]ia 12/27/2019   Diabetic foot ulcer (HCC)    FUO (fever of unknown origin) 02/16/2016   Glaucoma    Heartburn    Hx of adenomatous colonic polyps 08/09/2022   5 adenomas max 10 mm recall 2027   Hyperlipidemia    Hypertension    Hypertrophied anal papilla    LVH (left ventricular hypertrophy) 03/29/2016   Palpitation    Rash and nonspecific skin eruption 02/16/2016   Sleep apnea    CPAP   SOBOE (shortness of breath on exertion)    Transaminitis 02/16/2016    Past Surgical History:  Procedure Laterality Date   AMPUTATION TOE Right 04/2020   AMPUTATION TOE Right 01/2020   AMPUTATION TOE Left    Big toe   HERNIA REPAIR     SPINE SURGERY     L4/L5   TUMOR REMOVAL  12/1998   Bone Cyst    Family History  Problem Relation Age of Onset   Skin cancer Mother    Cancer Mother    Heart disease Mother        Atrial Fib   Hypertension Mother    Hyperlipidemia Mother    Obesity Mother    Cancer Father    Heart disease Father        Valve Disease   Heart disease Maternal Grandmother    Hyperlipidemia Maternal Grandmother    Hypertension Maternal Grandmother    Heart disease Maternal Grandfather    Heart disease Paternal Grandfather    Hypertension Paternal Grandfather    Hyperlipidemia Paternal Grandfather    Sleep apnea Neg Hx    Colon cancer Neg Hx    Esophageal cancer Neg Hx    Stomach cancer Neg Hx     Social History:  reports that he has never smoked. He has never used smokeless tobacco. He reports that he does not drink alcohol and does not use drugs.  Allergies:  Allergies  Allergen  Reactions   Other Other (See Comments)    Hayfever    Medications: I have reviewed the patient's current medications.  The PMH, PSH, Medications, Allergies, and SH were reviewed and updated.  ROS: Constitutional: Negative for fever, weight loss and weight gain. Cardiovascular: Negative for chest pain and dyspnea on exertion. Respiratory: Is not experiencing shortness of breath at rest. Gastrointestinal: Negative for nausea and vomiting. Neurological: Negative for headaches. Psychiatric: The patient is not nervous/anxious  Blood pressure 138/81, pulse 98, SpO2 99%.  PHYSICAL EXAM:  Exam: General: Well-developed, well-nourished Respiratory Respiratory effort: Equal inspiration and expiration without stridor Cardiovascular Peripheral Vascular: Warm extremities with equal color/perfusion Eyes: No nystagmus with equal extraocular motion bilaterally Neuro/Psych/Balance: Patient oriented to person, place, and time; Appropriate mood and affect; Gait  is intact with no imbalance; Cranial nerves I-XII are intact Head and Face Inspection: Normocephalic and atraumatic without mass or lesion Palpation: Facial skeleton intact without bony stepoffs Salivary Glands: No mass or tenderness Facial Strength: Facial motility symmetric and full bilaterally ENT Pinna: External ear intact and fully developed External canal: Canal is patent with intact skin Tympanic Membrane: Clear and mobile External Nose: No scar or anatomic deformity Internal Nose: Septum is deviated to the left with caudal septal spur on the right on anterior rhinoscopy  Lips, Teeth, and gums: Mucosa and teeth intact and viable TMJ: No pain to palpation with full mobility Oral cavity/oropharynx: No erythema or exudate, no lesions present Neck Neck and Trachea: Midline trachea without mass or lesion Thyroid: No mass or nodularity Lymphatics: No lymphadenopathy   Studies Reviewed: MRI Brain 03/10/2023 FINDINGS: Brain:  There is no evidence of an acute infarct, intracranial hemorrhage, mass, midline shift, or extra-axial fluid collection. Mild cerebral atrophy is within normal limits for age. T2 hyperintensities in the cerebral white matter are nonspecific but compatible with mild chronic small vessel ischemic disease. No abnormal enhancement is identified.   Vascular: Major intracranial vascular flow voids are preserved.   Skull and upper cervical spine: No suspicious marrow lesion.   Sinuses/Orbits: Unremarkable orbits. Extensive right greater than left ethmoid air cell mucosal thickening. Circumferential mucosal thickening and fluid resulting in near complete opacification of the left sphenoid sinus. Milder mucosal thickening in the right frontal and right sphenoid sinuses. Clear mastoid air cells.   Other: None.   IMPRESSION: 1. No acute intracranial abnormality or mass. 2. Mild chronic small vessel ischemic disease. 3. Extensive bilateral ethmoid and left sphenoid sinusitis.  CT sinuses 06/06/23 CLINICAL DATA:  Chronic ethmoid sinusitis with chronic cough. [facility] pre-surgical planning.   EXAM: CT STEALTH SINUS W/O CONTRAST   TECHNIQUE: Multidetector CT images of the paranasal sinuses were obtained using the standard protocol without intravenous contrast.   RADIATION DOSE REDUCTION: This exam was performed according to the departmental dose-optimization program which includes automated exposure control, adjustment of the mA and/or kV according to patient size and/or use of iterative reconstruction technique.   COMPARISON:  MRI 03/10/2023   FINDINGS: Paranasal sinuses:   Frontal: Normally aerated. Patent frontal sinus drainage pathways.   Ethmoid: Ethmoid sinuses are presently clear except for a single opacified posterior ethmoid air cell on the right.   Maxillary: Maxillary sinuses clear.   Sphenoid: Presently clear. Resolution of inflammatory disease on the left. Note that  the floors are asymmetric with the left being higher and the pterygoid canal bulges slightly into the floor of the left division.   Right ostiomeatal unit: Patent.   Left ostiomeatal unit: Patent.   Nasal passages: Patent. Nasal septum bows 7 mm towards the right, therefore the nasal passages on the right are diminutive compared to the left.   Anatomy: Pneumatization superior to the anterior ethmoid notch on the left. Right olfactory groove is wider than the left. Depth 7 mm. Sellar sphenoid pneumatization pattern. No dehiscence of carotid or optic canals. No onodi cell.   Other: None   IMPRESSION: 1. Resolution of inflammatory disease since the MRI of 03/10/2023. Single opacified posterior ethmoid air cell on the right. Resolution of inflammatory disease in the left sphenoid sinus. 2. Nasal septum bows 7 mm towards the right, therefore the nasal passages on the right are diminutive compared to the left.  Assessment/Plan: Encounter Diagnoses  Name Primary?   Age-related vocal fold atrophy  Glottic insufficiency    Dysphonia Yes   Chronic nasal congestion    Chronic GERD    Environmental and seasonal allergies    Post-nasal drip    Nasal septal deviation      Chronic persistent dysphonia with raspy voice and poor projection following intubation for surgery last summer and subsequent sinus infection 2 months after the surgery.  Reports persistent hoarseness and sore throat after he was extubated and would like to have vocal folds checked for any evidence of injury as well as other causes of dysphonia symptoms Videostrobe today: Bilateral vocal fold atrophy and glottic insufficiency with spindle-shaped glottic gap, mucosal wave intact and symmetric bilaterally, moderate to severe postcricoid edema and pachydermia, evidence of postnasal drainage, evidence of pseudosulcus -We discussed that there is no evidence of injury to the vocal cords but he had vocal fold atrophy as well  as mild vocal fold edema with findings consistent with postnasal drainage and GERD LPR -Trial of voice therapy -Medical management of GERD LPR and nasal congestion postnasal drainage  2.  Chronic nasal congestion nasal obstruction history of seasonal allergies and evidence of bilateral ethmoid and sphenoid sinusitis on MRI brain last summer -CT [facility] sinuses to evaluate for persistent chronic sinus inflammation -Nasal saline rinses with NeilMed bottle -Continue Flonase 2 puffs bilateral nares twice daily and Allegra  3.  Chronic GERD LPR -wears CPAP for OSA and this could be one of the contributing factors to more significant GERD and nasal congestion  - trial of Pepcid 20 mg BID - trial of reflux gourmnet  - we discussed diet and lifestyle changes to minimize reflux  4.  Chronic nasal congestion nasal obstruction -evidence of septal deviation inferior turbinate hypertrophy on scope exam today -Medical management as above -Will consider septal ITR in the future if fails medical management  Update 07/11/23 Assessment and Plan  Chronic dysphonia - improved, videostrobe in the past with VF atrophy and glottic insuficiency - due to improvement will hold off on voice therapy referral at this time     Chronic Nasal congestion  CT sinuses reviewed today without chronic sinus inflammation. He had septal deviation and inferior turbinate hypertrophy on CT. Reports reduced congestion and cough overall. Septal deviation noted but not causing significant issues.  - Continue Flonase 2 puffs b/l nares BID - Continue saline nasal rinses - Consider septoplasty if CPAP becomes intolerable  Chronic GERD Laryngopharyngeal Reflux (LPR) LPR with improved symptoms. Currently on famotidine 20 mg BID with no issues. Discussed potential long-term use and side effects of famotidine, noting it has a better side effect profile over a long period compared to PPPI. Discussed tapering off famotidine if symptoms  remain controlled and the importance of dietary and lifestyle modifications. Provided handout on reflux management and website resources. - Continue famotidine 20 mg twice daily - Consider tapering off famotidine if symptoms remain controlled - Follow dietary and lifestyle modifications - Provide handout on reflux management and website resources  Obstructive Sleep Apnea (OSA) OSA well-managed with CPAP therapy. Reports no issues with CPAP and is using an [employer] machine. - Continue CPAP therapy with Autosense machine  General Health Maintenance Discussed importance of hydration, especially during winter months. Advised on maintaining even hydration levels and avoiding caffeinated beverages for better voice and overall health. Recommended using diluted lemonade for flavor if needed. - Maintain even hydration throughout the day - Avoid caffeinated beverages - Use diluted lemonade for flavor if needed  Follow-up - Schedule follow-up appointment in six  months  I spent 30 minutes in total face-to-face time and in reviewing records during pre-charting, more than 50% of which was spent in counseling and coordination of care, reviewing test results, reviewing medications and treatment regimen and/or in discussing or reviewing the diagnosis, the prognosis and treatment options. Pertinent laboratory and imaging test results that were available during this visit with the patient were reviewed by me and considered in my medical decision making (see chart for details).     Ashok Croon, MD Otolaryngology [facility] Phone: [phone] Fax: [phone]    07/11/2023, 12:39 PM

## 2023-07-15 ENCOUNTER — Ambulatory Visit (INDEPENDENT_AMBULATORY_CARE_PROVIDER_SITE_OTHER): Payer: Managed Care, Other (non HMO) | Admitting: Otolaryngology

## 2023-07-28 ENCOUNTER — Ambulatory Visit: Payer: Managed Care, Other (non HMO) | Admitting: Neurology

## 2023-08-01 ENCOUNTER — Ambulatory Visit: Payer: Managed Care, Other (non HMO) | Admitting: Neurology

## 2023-08-24 ENCOUNTER — Other Ambulatory Visit: Payer: Self-pay | Admitting: Family Medicine

## 2023-08-24 DIAGNOSIS — E119 Type 2 diabetes mellitus without complications: Secondary | ICD-10-CM

## 2023-08-25 ENCOUNTER — Telehealth: Payer: Self-pay

## 2023-08-25 NOTE — Telephone Encounter (Signed)
 LVM to reschedule due to provider out of the office.

## 2023-09-07 ENCOUNTER — Encounter: Payer: Self-pay | Admitting: Family Medicine

## 2023-09-07 DIAGNOSIS — E1142 Type 2 diabetes mellitus with diabetic polyneuropathy: Secondary | ICD-10-CM

## 2023-09-07 MED ORDER — GABAPENTIN 300 MG PO CAPS: 300.0000 mg | ORAL_CAPSULE | Freq: Every day | ORAL | 1 refills | Status: DC

## 2023-09-07 NOTE — Addendum Note (Signed)
 Addended by: Meredith Staggers R on: 09/07/2023 03:07 PM   Modules accepted: Orders

## 2023-09-12 ENCOUNTER — Ambulatory Visit: Payer: Managed Care, Other (non HMO) | Admitting: Neurology

## 2023-09-12 VITALS — BP 136/76 | HR 80 | Ht 69.0 in | Wt 287.0 lb

## 2023-09-12 DIAGNOSIS — G4733 Obstructive sleep apnea (adult) (pediatric): Secondary | ICD-10-CM

## 2023-09-12 NOTE — Progress Notes (Signed)
 Subjective:    Patient ID: Brandon Rich is a 63 y.o. male.  HPI    Interim history:   Brandon Rich is a 63 year old male with an underlying medical complex medical history of congestive heart failure, allergies, depression, diabetes, history of diabetic foot ulcer, palpitations, hypertension, hyperlipidemia, intermittent double vision, severe obstructive sleep apnea (on PAP therapy), and severe obesity with a BMI of over 40, who presents for follow-up consultation of his obstructive sleep apnea, after starting treatment with a new AutoPap machine.  The patient is unaccompanied today.  I last saw the on 06/08/2023, at which time he was compliant with his AutoPap of 9 to 14 cm, machine was older and he needed a replacement.  He had a home sleep test but not too long ago through our sleep lab on 07/21/2021 which showed an AHI of 65.7/h, O2 nadir 80%.  I prescribed a new AutoPap machine.  His set up date was 06/28/2023.  He has a ResMed air sense 11 AutoSet machine.  His DME company is [employer].      Today, 08/12/2023: I reviewed his AutoPap compliance data from 08/09/2023 through 09/07/2023, which is a total of 30 days, during which time he used his machine every night with percent use days greater than 4 hours at 93%, indicating excellent compliance, average usage of 7 hours and 28 minutes, residual AHI at goal at 1.8/h, 95th percentile of pressure at 13.4 cm with a range of 9 to 14 cm with EPR of 2.  Leak on the low side with the 95th percentile at 0.5 L/min.  He reports doing well with his new autoPAP.  Uses a nasal pillows interface successfully.  He previously tried a nasal mask in the past.  He is up-to-date with his supplies and changes of filter in the machine once a month and has also purchased a separate filter for the tubing which he also changes once a month and he does see that the tubing filter also picks up some dirt or dust.  He has allergies, uses Allegra and Flonase, he also takes  hydroxyzine at bedtime to help with sleep maintenance issues that are fluctuating typically.  Overall, he is doing well, working with endocrinology on better diabetes control and weight loss, is on [place] and Semglee.  No recurrence of diplopia, no new visual symptoms or neurological concerns at this time.  The patient's allergies, current medications, family history, past medical history, past social history, past surgical history and problem list were reviewed and updated as appropriate.    Previously:  06/08/2023:  I had evaluated him for obstructive sleep apnea in 2022, at which time he reported a prior diagnosis of OSA.  He was on PAP therapy.  He had a home sleep test through our office on 07/21/2021 which showed severe obstructive sleep apnea with a total AHI of 65.7/hour and O2 nadir of 80% with significant time below or at 88% saturation of 38.6 minutes.  He was advised to continue with PAP therapy.  I prescribed a new machine.  I recently evaluated him for double vision.  He had a recent carotid Doppler ultrasound which was benign.   He reports full compliance with his machine.  He has been getting his supplies online.  He uses a nasal pillows interface.  He would like to get a new machine, AirSense 11.  He did not bring his PAP machine today.  He forgot it at home.  He would be willing to bring it back  for download.  He reports that the machine has shown him an error message that the motor life has been exceeded.  He never did get a new machine where prescribed it in January 2023. He works from home, he is semiretired.  He lives with his wife, bedtime around 9:30 PM or 10 PM and rise time around 7 AM.  He denies nightly nocturia and recurrent nocturnal morning headaches.  He limits his caffeine, drinks 1 cup of coffee in the morning and otherwise decaf coffee and occasional soda.  He avoids drink caffeine in the evening.  They have 2 grown children, 1 brand-new grandson, age 6 months and they see  him almost every weekend in the [place].  Currently do not have any pets.  He has had weight fluctuation, lost quite a bit of weight when he was with healthy weight and wellness but then had some foot surgeries and stopped going to the weight loss clinic.  He is planning to go back.  He is scheduled to see ophthalmology next month.  No significant change in his vision, no new symptoms.  No significant diplopia.   Addendum, 06/13/2023: I received his compliance report.  He used his AutoPap of 9 to 14 cm nightly between 05/09/2023 through 06/07/2023, residual AHI at goal at 1.2/h, average pressure for the 95th percentile at 13.7 cm, leak on the low side with the 95th percentile at 0 L/min, average usage of 6 hours and 48 minutes.   05/05/23: (He) reports intermittent double vision on side-to-side gaze since August 2024.  Symptoms were not sudden in onset as far as he recalls, no other weakness, no droopy eyelid or droopy face, no slurring of speech, no strokelike symptoms.  He has stable and no new foot pain and numbness in his feet.  He does endorse falling a few months ago.  He was in a hotel bathroom and fell as he turned in the shower tub, thankfully no head injury but landed on his behind.  He does have some tendency to dehydrate.  He tries to hydrate well with water.  He has appointment with his ophthalmologist, he did not make a sooner appointment, his routine follow-up appointment is approaching within the next month.  He gets a full eye exam at least twice a year for his glaucoma and to check for diabetic retinopathy.  No history of diabetic retinopathy as far as he knows.  He also gets a visual field check at least once a year with his ophthalmologist.     He denies any significant headache. He drinks alcohol rarely.  He is a non-smoker.   I reviewed your office note from 03/30/2023.  At that time he had recently finished Augmentin for bilateral ethmoid and left sphenoid sinusitis.  He reported  improvement in his sense of taste and smell.  He had a brain MRI with and without contrast on 03/10/2023 and I reviewed the results:      IMPRESSION: 1. No acute intracranial abnormality or mass. 2. Mild chronic small vessel ischemic disease. 3. Extensive bilateral ethmoid and left sphenoid sinusitis.   I do not see a recent A1c in his chart but he reports that a couple of months ago it was 7.1 per endocrinology, he sees his endocrinologist at [facility].  In November 2023 his A1c was 7.9, elevated.  I also reviewed your office note from 03/09/2023, at which time he reported a loss of sense of taste and smell for about 2 weeks.  He  had tested for COVID at home which was negative.     He presented to the emergency room on 03/18/2023 with hypoglycemia.  His blood sugar at home was 34.  He improved with oral glucose and glucagon. Of note, I had evaluated him for his obstructive sleep apnea in 2022.  He was on AutoPap therapy at the time and needed a new machine.  He had a home sleep test through our office on 07/21/2021 which showed severe obstructive sleep apnea with a total AHI of 65.7/hour and O2 nadir of 80% with significant time below or at 88% saturation of 38.6 minutes.  I recommended ongoing treatment with PAP therapy and I wrote for a new AutoPap machine.  He did not return for a follow-up appointment.     06/18/2021: 64 year old right-handed gentleman with an underlying medical history of diabetes, glaucoma, hypertension, hyperlipidemia, LVH, palpitations, history of congestive heart failure, allergies, anxiety, degenerative cervical disc disease, depression, reflux disease, elevated liver enzymes, and obesity, who was previously diagnosed with obstructive sleep apnea and placed on positive airway pressure treatment.  Prior sleep study results are not available for my review today, testing was several years ago, around 2003.  He reports having had a sleep study at [facility].  I was able  to review his compliance data from the past 30 days.  He has been 100% compliance, average usage of 7 hours and 44 minutes, residual AHI at goal at 0.5/h, pressure range of 9 to 14 cm with EPR of 1, average pressure for the 95th percentile at 13.2 cm, leak low, nearly absent.  He uses a P 10 nasal pillows interface and has done well with it, started off with a nasal mask some years ago.  He has not gone without treatment within the past nearly 20 years for more than a handful of times.  This is his second machine and he purchased it out-of-pocket and gets his supplies online. He has been working on weight loss and has achieved a significant amount of weight reduction.  He has not had a reevaluation of his sleep apnea in years or follow-up with a sleep specialist.  His Epworth sleepiness score is 2 out of 24, fatigue severity score is 26 out of 63.  He is going to have left foot surgery next month.  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  He is diabetes control has improved over time.  Bedtime is currently between 9 and 9:30 PM and rise time between 5 and 7 most of the mornings.  He has been working from home, works as an Art gallery manager, on Human resources officer.  He is a non-smoker and drinks alcohol very rarely, caffeine in limitation, 1 cup of coffee generally once a day.  He lives with his wife, they have 1 dog in the household, they have 2 grown children, ages 66 and 108.  As he recalls, his sleep apnea was severe at the time, he remembers 40+ events per hour.  From his description, he had a split-night sleep study at the time.  He would be willing to get reevaluated.  He is probably eligible for new machine as his current machine is about 47 to 64 years old.   His Past Medical History Is Significant For: Past Medical History:  Diagnosis Date   Acute combined systolic and diastolic heart failure (HCC) 02/16/2016   Allergy    Anxiety    Phreesia 12/27/2019   Anxiety  DDD (degenerative disc  disease), cervical    Depression    Diabetes mellitus    Diabetes mellitus without complication (HCC)    Phreesia 12/27/2019   Diabetic foot ulcer (HCC)    FUO (fever of unknown origin) 02/16/2016   Glaucoma    Heartburn    Hx of adenomatous colonic polyps 08/09/2022   5 adenomas max 10 mm recall 2027   Hyperlipidemia    Hypertension    Hypertrophied anal papilla    LVH (left ventricular hypertrophy) 03/29/2016   Palpitation    Rash and nonspecific skin eruption 02/16/2016   Sleep apnea    CPAP   SOBOE (shortness of breath on exertion)    Transaminitis 02/16/2016    His Past Surgical History Is Significant For: Past Surgical History:  Procedure Laterality Date   AMPUTATION TOE Right 04/2020   AMPUTATION TOE Right 01/2020   AMPUTATION TOE Left    Big toe   HERNIA REPAIR     SPINE SURGERY     L4/L5   TUMOR REMOVAL  12/1998   Bone Cyst    His Family History Is Significant For: Family History  Problem Relation Age of Onset   Skin cancer Mother    Cancer Mother    Heart disease Mother        Atrial Fib   Hypertension Mother    Hyperlipidemia Mother    Obesity Mother    Cancer Father    Heart disease Father        Valve Disease   Heart disease Maternal Grandmother    Hyperlipidemia Maternal Grandmother    Hypertension Maternal Grandmother    Heart disease Maternal Grandfather    Heart disease Paternal Grandfather    Hypertension Paternal Grandfather    Hyperlipidemia Paternal Grandfather    Sleep apnea Neg Hx    Colon cancer Neg Hx    Esophageal cancer Neg Hx    Stomach cancer Neg Hx     His Social History Is Significant For: Social History   Socioeconomic History   Marital status: Married    Spouse name: Romello Hoehn   Number of children: 2   Years of education: 16   Highest education level: Bachelor's degree (e.g., BA, AB, BS)  Occupational History   Occupation: Education officer, museum  Tobacco Use   Smoking status: Never   Smokeless tobacco:  Never  Vaping Use   Vaping status: Never Used  Substance and Sexual Activity   Alcohol use: No    Alcohol/week: 0.0 standard drinks of alcohol    Comment: per pt 1 drink once in a long while   Drug use: No   Sexual activity: Yes    Partners: Female  Other Topics Concern   Not on file  Social History Narrative   Married. Education: [employer].    Social Drivers of Corporate investment banker Strain: Low Risk  (12/20/2022)   Overall Financial Resource Strain (CARDIA)    Difficulty of Paying Living Expenses: Not very hard  Food Insecurity: No Food Insecurity (12/20/2022)   Hunger Vital Sign    Worried About Running Out of Food in the Last Year: Never true    Ran Out of Food in the Last Year: Never true  Transportation Needs: No Transportation Needs (12/20/2022)   PRAPARE - Administrator, Civil Service (Medical): No    Lack of Transportation (Non-Medical): No  Physical Activity: Insufficiently Active (12/20/2022)   Exercise Vital Sign    Days of Exercise  per Week: 1 day    Minutes of Exercise per Session: 20 min  Stress: No Stress Concern Present (12/20/2022)   [employer] of Occupational Health - Occupational Stress Questionnaire    Feeling of Stress : Only a little  Social Connections: Moderately Isolated (12/20/2022)   Social Connection and Isolation Panel [NHANES]    Frequency of Communication with Friends and Family: Twice a week    Frequency of Social Gatherings with Friends and Family: Once a week    Attends Religious Services: Never    Database administrator or Organizations: No    Attends Engineer, structural: Not on file    Marital Status: Married    His Allergies Are:  Allergies  Allergen Reactions   Other Other (See Comments)    Hayfever  :   His Current Medications Are:  Outpatient Encounter Medications as of 09/12/2023  Medication Sig   aspirin 81 MG tablet Take 81 mg by mouth daily.   atorvastatin (LIPITOR) 20 MG tablet Take 1 tablet (20  mg total) by mouth daily.   BD PEN NEEDLE NANO U/F 32G X 4 MM MISC    calcium carbonate (TUMS - DOSED IN MG ELEMENTAL CALCIUM) 500 MG chewable tablet Chew 2 tablets by mouth daily as needed for indigestion or heartburn.   dorzolamide-timolol (COSOPT) 22.3-6.8 MG/ML ophthalmic solution 1 drop 2 (two) times daily.   famotidine (PEPCID) 20 MG tablet Take 1 tablet (20 mg total) by mouth 2 (two) times daily.   fexofenadine (ALLEGRA) 180 MG tablet Take 180 mg by mouth daily.   fluticasone (FLONASE) 50 MCG/ACT nasal spray PLACE 2 SPRAYS INTO BOTH NOSTRILS DAILY.   gabapentin (NEURONTIN) 300 MG capsule Take 1 capsule (300 mg total) by mouth at bedtime. With additional midday dose if needed.   hydrOXYzine (ATARAX) 25 MG tablet Take 0.5-1 tablets (12.5-25 mg total) by mouth at bedtime as needed for anxiety.   insulin glargine-yfgn (SEMGLEE, YFGN,) 100 UNIT/ML Pen Inject 50 Units into the skin daily. (Titrate as directed up to max daily dose of 100 units)   ipratropium (ATROVENT) 0.06 % nasal spray Place 1-2 sprays into both nostrils 4 (four) times daily. As needed for nasal congestion   JARDIANCE 25 MG TABS tablet TAKE ONE TABLET BY MOUTH ONE TIME DAILY   lisinopril (ZESTRIL) 40 MG tablet TAKE ONE TABLET BY MOUTH ONE TIME DAILY   metFORMIN (GLUCOPHAGE) 1000 MG tablet TAKE ONE TABLET BY MOUTH TWICE A DAY   Multiple Vitamin (MULTIVITAMIN) tablet Take 1 tablet by mouth every evening.    sertraline (ZOLOFT) 100 MG tablet TAKE ONE AND ONE-HALF TABLETS BY MOUTH ONE TIME DAILY   sildenafil (VIAGRA) 100 MG tablet TAKE ONE-HALF TO ONE TABLET BY MOUTH ONE TIME DAILY AS NEEDED FOR ERECTILE DYSFUNCTION   tirzepatide (MOUNJARO) 15 MG/0.5ML Pen Inject 15 mg Subcutaneously once a week 90 days   Travoprost, BAK Free, (TRAVATAN) 0.004 % SOLN ophthalmic solution SMARTSIG:In Eye(s)   Vitamin D, Ergocalciferol, (DRISDOL) 1.25 MG (50000 UNIT) CAPS capsule Take 1 capsule (50,000 Units total) by mouth every 7 (seven) days.   No  facility-administered encounter medications on file as of 09/12/2023.  :  Review of Systems:  Out of a complete 14 point review of systems, all are reviewed and negative with the exception of these symptoms as listed below:   Review of Systems  Neurological:        Pt in rm #9 and alone. Patient states he is well and stable  with no new concerns. Pt ESS-1    Objective:  Neurological Exam  Physical Exam Physical Examination:   Vitals:   09/12/23 0736  BP: 136/76  Pulse: 80    General Examination: The patient is a very pleasant 63 y.o. male in no acute distress. He appears well-developed and well-nourished and well groomed.   HEENT: Normocephalic, atraumatic, pupils are equal, round and reactive, tracking well-preserved, corrective eyeglasses in place. Face is symmetric, airway examination reveals mild mouth dryness, mildly small airway, Mallampati class I, tonsils absent, neck circumference 20 and half inches.  Tongue protrudes centrally and palate elevates symmetrically.  Hearing grossly intact, no carotid bruits.  Neck with full range of motion.   Chest: Clear to auscultation without wheezing, rhonchi or crackles noted.   Heart: S1+S2+0, regular and normal without murmurs, rubs or gallops noted.    Abdomen: Soft, non-tender and non-distended.   Extremities: There is no obvious swelling in the distal lower extremities bilaterally.    Skin: Warm and dry without trophic changes noted.    Musculoskeletal: exam reveals no obvious joint deformities.    Neurologically:  Mental status: The patient is awake, alert and oriented in all 4 spheres. His immediate and remote memory, attention, language skills and fund of knowledge are appropriate. There is no evidence of aphasia, agnosia, apraxia or anomia. Speech is clear with normal prosody and enunciation. Thought process is linear. Mood is normal and affect is normal.  Cranial nerves II - XII are as described above under HEENT exam.   Motor exam: Normal bulk, strength and tone is noted. There is no drift, postural or action or intention tremor, no rebound.   Fine motor skills and coordination: Grossly intact.   Cerebellar testing: No dysmetria or intention tremor. There is no truncal or gait ataxia.  Sensory exam: intact to light touch in the upper extremities. Gait, station and balance: He stands easily. No veering to one side is noted. No leaning to one side is noted. Posture is age-appropriate and stance is narrow based. Gait shows normal stride length and normal pace. No problems turning are noted.   Assessment and plan:  In summary, Brandon Rich is a very pleasant 63 year old male with an underlying medical complex medical history of congestive heart failure, allergies, depression, diabetes, history of diabetic foot ulcer, palpitations, hypertension, hyperlipidemia, intermittent double vision, severe obstructive sleep apnea (on PAP therapy), and severe obesity with a BMI of over 40, who presents for follow-up consultation of his obstructive sleep apnea, after starting treatment with a new AutoPap machine.  He has a longstanding history of obstructive sleep apnea.  He had a home sleep test through our sleep lab on 07/21/2021 which showed an AHI of 65.7/h, O2 nadir 80%.  His set up date was 06/28/2023.  He has a ResMed air sense 11 AutoSet machine.  His DME company is [employer].  He is compliant with treatment and commended for it.  He is benefiting from AutoPap therapy.  He is working on weight loss and better diabetes control with his endocrinologist.  He has had no residual or recurrent diplopia.  He is advised to follow-up routinely in our sleep clinic to see one of our nurse practitioners in 1 year.  His carotid Doppler ultrasound was benign.  I answered all his questions today and he was in agreement with our plan.   I spent 30 minutes in total face-to-face time and in reviewing records during pre-charting, more than 50% of  which was spent  in counseling and coordination of care, reviewing test results, reviewing medications and treatment regimen and/or in discussing or reviewing the diagnosis of OSA, the prognosis and treatment options. Pertinent laboratory and imaging test results that were available during this visit with the patient were reviewed by me and considered in my medical decision making (see chart for details).

## 2023-09-12 NOTE — Patient Instructions (Signed)
 It was nice to see you again today. I am glad to hear, things are going well with your new autoPAP therapy. You have fulfilled the insurance-mandated compliance percentage. You should be able to get ongoing supplies through your insurance. Please talk to your DME provider about getting replacement supplies on a regular basis. Please be sure to change your filter every month, your mask about every 3 months, hose about every 6 months, humidifier chamber about yearly. Some restrictions are imposed by your insurance carrier with regard to how frequently you can get certain supplies.  Your DME company can provide further details if necessary.   Please continue using your autoPAP regularly. While your insurance may require that you use PAP at least 4 hours each night on 70% of the nights, I recommend, that you not skip any nights and use it throughout the night if you can. Getting used to PAP and staying with the treatment long term does take time and patience and discipline. Untreated obstructive sleep apnea when it is moderate to severe can have an adverse impact on cardiovascular health and raise her risk for heart disease, arrhythmias, hypertension, congestive heart failure, stroke and diabetes. Untreated obstructive sleep apnea causes sleep disruption, nonrestorative sleep, and sleep deprivation. This can have an impact on your day to day functioning and cause daytime sleepiness and impairment of cognitive function, memory loss, mood disturbance, and problems focussing. Using PAP regularly can improve these symptoms.  We can see you in 1 year, you can see one of our nurse practitioners as you are stable.

## 2023-09-29 LAB — HEMOGLOBIN A1C: A1c: 7.6

## 2023-12-07 ENCOUNTER — Ambulatory Visit: Admitting: Family Medicine

## 2023-12-07 VITALS — BP 122/70 | HR 84 | Temp 98.2°F | Ht 68.0 in | Wt 280.6 lb

## 2023-12-07 DIAGNOSIS — R051 Acute cough: Secondary | ICD-10-CM | POA: Diagnosis not present

## 2023-12-07 DIAGNOSIS — R0989 Other specified symptoms and signs involving the circulatory and respiratory systems: Secondary | ICD-10-CM

## 2023-12-07 DIAGNOSIS — J22 Unspecified acute lower respiratory infection: Secondary | ICD-10-CM | POA: Diagnosis not present

## 2023-12-07 LAB — POCT INFLUENZA A/B
Influenza A, POC: NEGATIVE
Influenza B, POC: NEGATIVE

## 2023-12-07 LAB — POC COVID19 BINAXNOW: SARS Coronavirus 2 Ag: NEGATIVE

## 2023-12-07 MED ORDER — AZITHROMYCIN 250 MG PO TABS
ORAL_TABLET | ORAL | 0 refills | Status: AC
Start: 2023-12-07 — End: 2023-12-12

## 2023-12-07 MED ORDER — BENZONATATE 100 MG PO CAPS
100.0000 mg | ORAL_CAPSULE | Freq: Three times a day (TID) | ORAL | 0 refills | Status: DC | PRN
Start: 2023-12-07 — End: 2024-01-06

## 2023-12-07 NOTE — Patient Instructions (Signed)
  Thanks for coming in today.  Labs were overall clear, but with the persistent cough I think it is reasonable to try azithromycin for possible lower respiratory infection.  Tessalon  Perles if needed for cough or Mucinex over-the-counter.  If any shortness of breath, fevers, or worsening symptoms be seen but I do not expect that to occur.  Hope you feel better soon.  Cough, Adult Coughing is a reflex that clears your throat and airways (respiratory system). It helps heal and protect your lungs. It is normal to cough from time to time. A cough that happens with other symptoms or that lasts a long time may be a sign of a condition that needs treatment. A short-term (acute) cough may only last 2-3 weeks. A long-term (chronic) cough may last 8 or more weeks. Coughing is often caused by: Diseases, such as: An infection of the respiratory system. Asthma or other heart or lung diseases. Gastroesophageal reflux. This is when acid comes back up from the stomach. Breathing in things that irritate your lungs. Allergies. Postnasal drip. This is when mucus runs down the back of your throat. Smoking. Some medicines. Follow these instructions at home: Medicines Take over-the-counter and prescription medicines only as told by your health care provider. Talk with your provider before you take cough medicine (cough suppressants). Eating and drinking Do not drink alcohol. Avoid caffeine. Drink enough fluid to keep your pee (urine) pale yellow. Lifestyle Avoid cigarette smoke. Do not use any products that contain nicotine or tobacco. These products include cigarettes, chewing tobacco, and vaping devices, such as e-cigarettes. If you need help quitting, ask your provider. Avoid things that make you cough. These may include perfumes, candles, cleaning products, or campfire smoke. General instructions  Watch for any changes to your cough. Tell your provider about them. Always cover your mouth when you  cough. If the air is dry in your bedroom or home, use a cool mist vaporizer or humidifier. If your cough is worse at night, try to sleep in a semi-upright position. Rest as needed. Contact a health care provider if: You have new symptoms, or your symptoms get worse. You cough up pus. You have a fever that does not go away or a cough that does not get better after 2-3 weeks. You cannot control your cough with medicine, and you are losing sleep. You have pain that gets worse or is not helped with medicine. You lose weight for no clear reason. You have night sweats. Get help right away if: You cough up blood. You have trouble breathing. Your heart is beating very fast. These symptoms may be an emergency. Get help right away. Call 911. Do not wait to see if the symptoms will go away. Do not drive yourself to the hospital. This information is not intended to replace advice given to you by your health care provider. Make sure you discuss any questions you have with your health care provider. Document Revised: 03/05/2022 Document Reviewed: 03/05/2022 Elsevier Patient Education  2024 [employer].

## 2023-12-07 NOTE — Progress Notes (Signed)
 Subjective:  Patient ID: Brandon Rich, male    DOB: Oct 01, 1960  Age: 63 y.o. MRN: [id number]  CC:  Chief Complaint  Patient presents with   Nasal Congestion    Chest congestion* started a week ago. States it has been a cough and the cough produces "stuff".     HPI [name] presents for   Cough, congestion Started 1 week ago. No fever.  Some fatigue, but busy with recent activities.  Cough in upper chest area.  Light phlegm initially - now darker green sputum.  Cough is about the same - coughing fits at times. No emesis. No near syncope/syncope.  No treatments. Eating and drinking ok. No confusion.  No sick contacts.  Will be visiting family this weekend.    QTC 411 on last EKG.    History Patient Active Problem List   Diagnosis Date Noted   Class 3 severe obesity with serious comorbidity and body mass index (BMI) of 45.0 to 49.9 in adult 08/16/2022   Hx of adenomatous colonic polyps 08/09/2022   Type 2 diabetes mellitus with obesity (HCC) 07/05/2022   At risk for hyperglycemia 03/15/2022   Polyphagia 08/26/2021   Acquired hallux rigidus of left foot 04/14/2021   Nonalcoholic hepatosteatosis 12/22/2020   Other fatigue 12/04/2020   SOBOE (shortness of breath on exertion) 12/04/2020   Vitamin D  deficiency 12/04/2020   Depression 12/04/2020   Acquired claw toe of right foot 04/01/2020   Class 3 severe obesity due to excess calories with serious comorbidity and body mass index (BMI) of 40.0 to 44.9 in adult 09/10/2019   Chest discomfort 02/02/2019   Morbid obesity (HCC) 02/02/2019   Cortical age-related cataract of both eyes 03/30/2017   Nuclear sclerotic cataract of both eyes 03/30/2017   Primary open angle glaucoma (POAG) of both eyes, indeterminate stage 03/30/2017   Type 2 diabetes mellitus with foot ulcer, with long-term current use of insulin (HCC) 03/30/2017   LVH (left ventricular hypertrophy) 03/29/2016   T wave inversion in EKG 02/16/2016    Transaminitis 02/16/2016   Pain of left great toe 08/23/2013   Hypertension associated with type 2 diabetes mellitus (HCC) 12/20/2011   Diabetes mellitus (HCC) 12/20/2011   Hyperlipidemia associated with type 2 diabetes mellitus (HCC) 12/20/2011   Glaucoma (increased eye pressure) 07/19/1996   Past Medical History:  Diagnosis Date   Acute combined systolic and diastolic heart failure (HCC) 02/16/2016   Allergy    Anxiety    Phreesia 12/27/2019   Anxiety    DDD (degenerative disc disease), cervical    Depression    Diabetes mellitus    Diabetes mellitus without complication (HCC)    Phreesia 12/27/2019   Diabetic foot ulcer (HCC)    FUO (fever of unknown origin) 02/16/2016   Glaucoma    Heartburn    Hx of adenomatous colonic polyps 08/09/2022   5 adenomas max 10 mm recall 2027   Hyperlipidemia    Hypertension    Hypertrophied anal papilla    LVH (left ventricular hypertrophy) 03/29/2016   Palpitation    Rash and nonspecific skin eruption 02/16/2016   Sleep apnea    CPAP   SOBOE (shortness of breath on exertion)    Transaminitis 02/16/2016   Past Surgical History:  Procedure Laterality Date   AMPUTATION TOE Right 04/2020   AMPUTATION TOE Right 01/2020   AMPUTATION TOE Left    Big toe   HERNIA REPAIR     SPINE SURGERY  L4/L5   TUMOR REMOVAL  12/1998   Bone Cyst   Allergies  Allergen Reactions   Other Other (See Comments)    Hayfever   Prior to Admission medications   Medication Sig Start Date End Date Taking? Authorizing Provider  aspirin 81 MG tablet Take 81 mg by mouth daily.   Yes [provider]  atorvastatin  (LIPITOR) 20 MG tablet Take 1 tablet (20 mg total) by mouth daily. 07/06/23  Yes Benjiman Bras, MD  BD PEN NEEDLE NANO U/F 32G X 4 MM MISC  07/04/18  Yes [provider]  calcium  carbonate (TUMS - DOSED IN MG ELEMENTAL CALCIUM ) 500 MG chewable tablet Chew 2 tablets by mouth daily as needed for indigestion or heartburn.   Yes  [provider]  dorzolamide-timolol (COSOPT) 22.3-6.8 MG/ML ophthalmic solution 1 drop 2 (two) times daily.   Yes [provider]  famotidine  (PEPCID ) 20 MG tablet Take 1 tablet (20 mg total) by mouth 2 (two) times daily. 05/09/23  Yes Soldatova, Liuba, MD  fexofenadine (ALLEGRA) 180 MG tablet Take 180 mg by mouth daily.   Yes [provider]  fluticasone  (FLONASE ) 50 MCG/ACT nasal spray PLACE 2 SPRAYS INTO BOTH NOSTRILS DAILY. 07/09/14  Yes GuestDorthea Gauze, MD  gabapentin  (NEURONTIN ) 300 MG capsule Take 1 capsule (300 mg total) by mouth at bedtime. With additional midday dose if needed. 09/07/23  Yes Benjiman Bras, MD  hydrOXYzine  (ATARAX ) 25 MG tablet Take 0.5-1 tablets (12.5-25 mg total) by mouth at bedtime as needed for anxiety. 07/06/23  Yes Benjiman Bras, MD  insulin glargine-yfgn (SEMGLEE, YFGN,) 100 UNIT/ML Pen Inject 50 Units into the skin daily. (Titrate as directed up to max daily dose of 100 units) 07/20/22  Yes [provider]  ipratropium (ATROVENT ) 0.06 % nasal spray Place 1-2 sprays into both nostrils 4 (four) times daily. As needed for nasal congestion 03/30/23  Yes Benjiman Bras, MD  JARDIANCE  25 MG TABS tablet TAKE ONE TABLET BY MOUTH ONE TIME DAILY 08/24/23  Yes Benjiman Bras, MD  lisinopril  (ZESTRIL ) 40 MG tablet TAKE ONE TABLET BY MOUTH ONE TIME DAILY 05/02/23  Yes Benjiman Bras, MD  metFORMIN  (GLUCOPHAGE ) 1000 MG tablet TAKE ONE TABLET BY MOUTH TWICE A DAY 07/06/23  Yes Benjiman Bras, MD  Multiple Vitamin (MULTIVITAMIN) tablet Take 1 tablet by mouth every evening.    Yes [provider]  sertraline  (ZOLOFT ) 100 MG tablet TAKE ONE AND ONE-HALF TABLETS BY MOUTH ONE TIME DAILY 06/29/23  Yes Benjiman Bras, MD  sildenafil  (VIAGRA ) 100 MG tablet TAKE ONE-HALF TO ONE TABLET BY MOUTH ONE TIME DAILY AS NEEDED FOR ERECTILE DYSFUNCTION 03/07/23  Yes Benjiman Bras, MD  tirzepatide  (MOUNJARO ) 15 MG/0.5ML Pen Inject 15 mg  Subcutaneously once a week 90 days 06/17/22  Yes   Travoprost, BAK Free, (TRAVATAN) 0.004 % SOLN ophthalmic solution SMARTSIG:In Eye(s) 01/28/21  Yes [provider]  Vitamin D , Ergocalciferol , (DRISDOL ) 1.25 MG (50000 UNIT) CAPS capsule Take 1 capsule (50,000 Units total) by mouth every 7 (seven) days. 09/13/22  Yes Whitmire, Dawn, FNP   Social History   Socioeconomic History   Marital status: Married    Spouse name: Jaymond Waage   Number of children: 2   Years of education: 16   Highest education level: Bachelor's degree (e.g., BA, AB, BS)  Occupational History   Occupation: Education officer, museum  Tobacco Use   Smoking status: Never   Smokeless tobacco: Never  Vaping Use  Vaping status: Never Used  Substance and Sexual Activity   Alcohol use: No    Alcohol/week: 0.0 standard drinks of alcohol    Comment: per pt 1 drink once in a long while   Drug use: No   Sexual activity: Yes    Partners: Female  Other Topics Concern   Not on file  Social History Narrative   Married. Education: [employer].    Social Drivers of Corporate investment banker Strain: Low Risk  (12/20/2022)   Overall Financial Resource Strain (CARDIA)    Difficulty of Paying Living Expenses: Not very hard  Food Insecurity: No Food Insecurity (12/20/2022)   Hunger Vital Sign    Worried About Running Out of Food in the Last Year: Never true    Ran Out of Food in the Last Year: Never true  Transportation Needs: No Transportation Needs (12/20/2022)   PRAPARE - Administrator, Civil Service (Medical): No    Lack of Transportation (Non-Medical): No  Physical Activity: Insufficiently Active (12/20/2022)   Exercise Vital Sign    Days of Exercise per Week: 1 day    Minutes of Exercise per Session: 20 min  Stress: No Stress Concern Present (12/20/2022)   [employer] of Occupational Health - Occupational Stress Questionnaire    Feeling of Stress : Only a little  Social Connections: Moderately Isolated  (12/20/2022)   Social Connection and Isolation Panel [NHANES]    Frequency of Communication with Friends and Family: Twice a week    Frequency of Social Gatherings with Friends and Family: Once a week    Attends Religious Services: Never    Database administrator or Organizations: No    Attends Engineer, structural: Not on file    Marital Status: Married  Catering manager Violence: Not on file    Review of Systems  Per HPI. Objective:   Vitals:   12/07/23 1519  BP: 122/70  Pulse: 84  Temp: 98.2 F (36.8 C)  TempSrc: Oral  SpO2: 94%  Weight: 280 lb 9.6 oz (127.3 kg)  Height: 5\' 8"  (1.727 m)     Physical Exam Vitals reviewed.  Constitutional:      Appearance: He is well-developed.  HENT:     Head: Normocephalic and atraumatic.     Right Ear: Tympanic membrane, ear canal and external ear normal.     Left Ear: Tympanic membrane, ear canal and external ear normal.     Nose: No rhinorrhea.     Mouth/Throat:     Pharynx: No oropharyngeal exudate or posterior oropharyngeal erythema.  Eyes:     Conjunctiva/sclera: Conjunctivae normal.     Pupils: Pupils are equal, round, and reactive to light.  Cardiovascular:     Rate and Rhythm: Normal rate and regular rhythm.     Heart sounds: Normal heart sounds. No murmur heard. Pulmonary:     Effort: Pulmonary effort is normal.     Breath sounds: Normal breath sounds. No wheezing, rhonchi or rales.  Abdominal:     Palpations: Abdomen is soft.     Tenderness: There is no abdominal tenderness.  Musculoskeletal:     Cervical back: Neck supple.  Lymphadenopathy:     Cervical: No cervical adenopathy.  Skin:    General: Skin is warm and dry.     Findings: No rash.  Neurological:     Mental Status: He is alert and oriented to person, place, and time.  Psychiatric:  Behavior: Behavior normal.       Results for orders placed or performed in visit on 12/07/23  POC COVID-19   Collection Time: 12/07/23  3:25 PM   Result Value Ref Range   SARS Coronavirus 2 Ag Negative Negative  POCT Influenza A/B   Collection Time: 12/07/23  3:25 PM  Result Value Ref Range   Influenza A, POC Negative Negative   Influenza B, POC Negative Negative    Assessment & Plan:  ANTAWN SISON is a 63 y.o. male . Chest congestion - Plan: POC COVID-19, POCT Influenza A/B, azithromycin (ZITHROMAX) 250 MG tablet  Acute cough - Plan: benzonatate  (TESSALON ) 100 MG capsule  LRTI (lower respiratory tract infection) - Plan: azithromycin (ZITHROMAX) 250 MG tablet  Overall reassuring exam, but persistent cough with discolored phlegm as above, possible lower respiratory tract infection, early CAP, or bronchitis versus viral illness.  Tessalon  Perles as needed for cough, option of azithromycin, especially if not improving within the next 24 to 48 hours with potential side effects and risks of antibiotics discussed, RTC precautions given. Meds ordered this encounter  Medications   benzonatate  (TESSALON ) 100 MG capsule    Sig: Take 1 capsule (100 mg total) by mouth 3 (three) times daily as needed for cough.    Dispense:  20 capsule    Refill:  0   azithromycin (ZITHROMAX) 250 MG tablet    Sig: Take 2 tablets on day 1, then 1 tablet daily on days 2 through 5    Dispense:  6 tablet    Refill:  0   Patient Instructions   Thanks for coming in today.  Labs were overall clear, but with the persistent cough I think it is reasonable to try azithromycin for possible lower respiratory infection.  Tessalon  Perles if needed for cough or Mucinex over-the-counter.  If any shortness of breath, fevers, or worsening symptoms be seen but I do not expect that to occur.  Hope you feel better soon.  Cough, Adult Coughing is a reflex that clears your throat and airways (respiratory system). It helps heal and protect your lungs. It is normal to cough from time to time. A cough that happens with other symptoms or that lasts a long time may be a sign  of a condition that needs treatment. A short-term (acute) cough may only last 2-3 weeks. A long-term (chronic) cough may last 8 or more weeks. Coughing is often caused by: Diseases, such as: An infection of the respiratory system. Asthma or other heart or lung diseases. Gastroesophageal reflux. This is when acid comes back up from the stomach. Breathing in things that irritate your lungs. Allergies. Postnasal drip. This is when mucus runs down the back of your throat. Smoking. Some medicines. Follow these instructions at home: Medicines Take over-the-counter and prescription medicines only as told by your health care provider. Talk with your provider before you take cough medicine (cough suppressants). Eating and drinking Do not drink alcohol. Avoid caffeine. Drink enough fluid to keep your pee (urine) pale yellow. Lifestyle Avoid cigarette smoke. Do not use any products that contain nicotine or tobacco. These products include cigarettes, chewing tobacco, and vaping devices, such as e-cigarettes. If you need help quitting, ask your provider. Avoid things that make you cough. These may include perfumes, candles, cleaning products, or campfire smoke. General instructions  Watch for any changes to your cough. Tell your provider about them. Always cover your mouth when you cough. If the air is dry in your bedroom  or home, use a cool mist vaporizer or humidifier. If your cough is worse at night, try to sleep in a semi-upright position. Rest as needed. Contact a health care provider if: You have new symptoms, or your symptoms get worse. You cough up pus. You have a fever that does not go away or a cough that does not get better after 2-3 weeks. You cannot control your cough with medicine, and you are losing sleep. You have pain that gets worse or is not helped with medicine. You lose weight for no clear reason. You have night sweats. Get help right away if: You cough up blood. You  have trouble breathing. Your heart is beating very fast. These symptoms may be an emergency. Get help right away. Call 911. Do not wait to see if the symptoms will go away. Do not drive yourself to the hospital. This information is not intended to replace advice given to you by your health care provider. Make sure you discuss any questions you have with your health care provider. Document Revised: 03/05/2022 Document Reviewed: 03/05/2022 Elsevier Patient Education  2024 [employer]      Signed,   Caro Christmas, MD [facility], [facility] 12/07/23 3:44 PM

## 2023-12-10 ENCOUNTER — Encounter: Payer: Self-pay | Admitting: Family Medicine

## 2024-01-06 ENCOUNTER — Other Ambulatory Visit: Payer: Self-pay | Admitting: Family Medicine

## 2024-01-06 ENCOUNTER — Ambulatory Visit: Payer: Managed Care, Other (non HMO) | Admitting: Family Medicine

## 2024-01-06 ENCOUNTER — Encounter: Payer: Self-pay | Admitting: Family Medicine

## 2024-01-06 VITALS — BP 130/76 | HR 85 | Temp 98.2°F | Wt 283.0 lb

## 2024-01-06 DIAGNOSIS — N529 Male erectile dysfunction, unspecified: Secondary | ICD-10-CM

## 2024-01-06 DIAGNOSIS — R051 Acute cough: Secondary | ICD-10-CM

## 2024-01-06 DIAGNOSIS — Z23 Encounter for immunization: Secondary | ICD-10-CM | POA: Diagnosis not present

## 2024-01-06 DIAGNOSIS — J22 Unspecified acute lower respiratory infection: Secondary | ICD-10-CM

## 2024-01-06 DIAGNOSIS — E1142 Type 2 diabetes mellitus with diabetic polyneuropathy: Secondary | ICD-10-CM

## 2024-01-06 DIAGNOSIS — I10 Essential (primary) hypertension: Secondary | ICD-10-CM | POA: Diagnosis not present

## 2024-01-06 DIAGNOSIS — Z794 Long term (current) use of insulin: Secondary | ICD-10-CM

## 2024-01-06 DIAGNOSIS — F411 Generalized anxiety disorder: Secondary | ICD-10-CM

## 2024-01-06 DIAGNOSIS — E785 Hyperlipidemia, unspecified: Secondary | ICD-10-CM

## 2024-01-06 DIAGNOSIS — F4323 Adjustment disorder with mixed anxiety and depressed mood: Secondary | ICD-10-CM

## 2024-01-06 DIAGNOSIS — F5104 Psychophysiologic insomnia: Secondary | ICD-10-CM

## 2024-01-06 DIAGNOSIS — E119 Type 2 diabetes mellitus without complications: Secondary | ICD-10-CM

## 2024-01-06 LAB — COMPREHENSIVE METABOLIC PANEL WITH GFR
ALT: 18 U/L (ref 0–53)
AST: 35 U/L (ref 0–37)
Albumin: 4.6 g/dL (ref 3.5–5.2)
Alkaline Phosphatase: 67 U/L (ref 39–117)
BUN: 16 mg/dL (ref 6–23)
CO2: 25 meq/L (ref 19–32)
Calcium: 9.5 mg/dL (ref 8.4–10.5)
Chloride: 105 meq/L (ref 96–112)
Creatinine, Ser: 0.91 mg/dL (ref 0.40–1.50)
GFR: 89.38 mL/min (ref >60.00)
Glucose, Bld: 154 mg/dL — ABNORMAL HIGH (ref 70–99)
Potassium: 4 meq/L (ref 3.5–5.1)
Sodium: 139 meq/L (ref 135–145)
Total Bilirubin: 0.4 mg/dL (ref 0.2–1.2)
Total Protein: 7.7 g/dL (ref 6.0–8.3)

## 2024-01-06 LAB — LIPID PANEL
Cholesterol: 119 mg/dL (ref 0–200)
HDL: 30 mg/dL — ABNORMAL LOW (ref >39.00)
LDL Cholesterol: 42 mg/dL (ref 0–99)
NonHDL: 89.18
Total CHOL/HDL Ratio: 4
Triglycerides: 236 mg/dL — ABNORMAL HIGH (ref 0.0–149.0)
VLDL: 47.2 mg/dL — ABNORMAL HIGH (ref 0.0–40.0)

## 2024-01-06 MED ORDER — LISINOPRIL 40 MG PO TABS: 40.0000 mg | ORAL_TABLET | Freq: Every day | ORAL | 2 refills | Status: DC

## 2024-01-06 MED ORDER — GABAPENTIN 300 MG PO CAPS: 300.0000 mg | ORAL_CAPSULE | Freq: Every day | ORAL | 1 refills | Status: AC

## 2024-01-06 MED ORDER — HYDROXYZINE HCL 25 MG PO TABS: 12.5000 mg | ORAL_TABLET | Freq: Every evening | ORAL | 1 refills | Status: DC | PRN

## 2024-01-06 MED ORDER — ATORVASTATIN CALCIUM 20 MG PO TABS: 20.0000 mg | ORAL_TABLET | Freq: Every day | ORAL | 1 refills | Status: DC

## 2024-01-06 MED ORDER — SERTRALINE HCL 100 MG PO TABS: 100.0000 mg | ORAL_TABLET | Freq: Every day | ORAL | 1 refills | Status: DC

## 2024-01-06 MED ORDER — METFORMIN HCL 1000 MG PO TABS: ORAL_TABLET | ORAL | 1 refills | Status: DC

## 2024-01-06 NOTE — Progress Notes (Signed)
 Subjective:  Patient ID: Brandon Rich, male    DOB: 17-Sep-1960  Age: 63 y.o. MRN: [id number]  CC:  Chief Complaint  Patient presents with   Medical Management of Chronic Issues    6 month follow up  Patient states seen 1 month ago for cough and congestion it is better but still some congestion and coughing.     HPI Brandon Rich presents for    Recently seen in May for cough, chest congestion, treated with azithromycin  for lower respiratory tract infection along with Tessalon  Perles. Cough has improved, only minimal left now. No fever, or shortness of breath  Followed by endocrinology for diabetes, polyneuropathy. Dr. Kathyanne Parkers   Hypertension: With OSA on CPAP treated with lisinopril  40 mg daily, followed by cardiology, Dr. Garnette Ka.  No new side effects with meds. Home readings: rare. 125-135/75-80.  Repeat BP ok.  BP Readings from Last 3 Encounters:  01/06/24 (!) 144/78  12/07/23 122/70  09/12/23 136/76   Lab Results  Component Value Date   CREATININE 1.00 07/06/2023   Anxiety with insomnia Sertraline  150 mg daily, and followed by therapist.  Hydroxyzine , gabapentin  has also been helpful with his sleep previously. Meds still working well.      01/06/2024    8:54 AM 07/06/2023    3:31 PM 01/17/2023    4:20 PM 12/23/2022    9:19 AM  GAD 7 : Generalized Anxiety Score  Nervous, Anxious, on Edge 0 0 0 0  Control/stop worrying 1 0 1 0  Worry too much - different things 1 1 1  0  Trouble relaxing 0 0 0 0  Restless 0 0 0 0  Easily annoyed or irritable 1 0 0 1  Afraid - awful might happen 0 0 0 0  Total GAD 7 Score 3 1 2 1   Anxiety Difficulty Not difficult at all  Not difficult at all        01/06/2024    8:53 AM 07/06/2023    3:31 PM 01/17/2023    4:20 PM 12/23/2022    9:19 AM 07/02/2022    8:07 AM  Depression screen PHQ 2/9  Decreased Interest 0 0 0 0 0  Down, Depressed, Hopeless 0 0 0 0 0  PHQ - 2 Score 0 0 0 0 0  Altered sleeping 0 1 0 1 1  Tired, decreased  energy 1 1 1 1 1   Change in appetite 0 0 0 0 1  Feeling bad or failure about yourself  0 0 0 0 0  Trouble concentrating 0 1 0 0 0  Moving slowly or fidgety/restless 0 0 0 0 0  Suicidal thoughts 0 0 0 0 0  PHQ-9 Score 1 3 1 2 3   Difficult doing work/chores Not difficult at all  Not difficult at all       Hyperlipidemia: Lipitor 20 mg daily without new myalgias. Lab Results  Component Value Date   CHOL 128 07/06/2023   HDL 28.50 (L) 07/06/2023   LDLCALC 45 07/06/2023   LDLDIRECT 87.0 07/02/2022   TRIG 269.0 (H) 07/06/2023   CHOLHDL 4 07/06/2023   Lab Results  Component Value Date   ALT 19 07/06/2023   AST 39 (H) 07/06/2023   ALKPHOS 68 07/06/2023   BILITOT 0.4 07/06/2023   Erectile dysfunction Viagra  100 mg, some headache, nasal congestion with use previously but denies hearing or vision changes or chest pain/dyspnea with exertion or intercourse. Still effective with full dose, no new side effects.  HM: Optho exam - recent visit - requesting visit.  Pneumonia vaccine - prevnar 20.   Immunization History  Administered Date(s) Administered   Influenza Split 04/04/2012, 04/14/2013, 03/25/2019   Influenza Whole 09/05/2017   Influenza,inj,Quad PF,6+ Mos 04/14/2013, 04/24/2014, 06/16/2015, 03/27/2016, 04/16/2017, 04/10/2018, 04/09/2022   Influenza-Unspecified 04/04/2012, 04/14/2013, 04/24/2014, 06/16/2015, 03/27/2016, 04/16/2017, 04/10/2018, 03/25/2019, 04/03/2021, 05/03/2023   PFIZER(Purple Top)SARS-COV-2 Vaccination 10/13/2019, 11/06/2019, 03/18/2020, 01/09/2021, 07/13/2021   Pneumococcal Conjugate-13 06/16/2015   Pneumococcal Polysaccharide-23 07/19/2006, 06/06/2014   Respiratory Syncytial Virus Vaccine ,Recomb Aduvanted(Arexvy ) 07/02/2022   Tdap 12/16/2016   Zoster Recombinant(Shingrix) 12/16/2018, 03/25/2019   Zoster, Unspecified 12/16/2018, 03/25/2019     History Patient Active Problem List   Diagnosis Date Noted   Class 3 severe obesity with serious comorbidity  and body mass index (BMI) of 45.0 to 49.9 in adult 08/16/2022   Hx of adenomatous colonic polyps 08/09/2022   Type 2 diabetes mellitus with obesity (HCC) 07/05/2022   At risk for hyperglycemia 03/15/2022   Polyphagia 08/26/2021   Acquired hallux rigidus of left foot 04/14/2021   Nonalcoholic hepatosteatosis 12/22/2020   Other fatigue 12/04/2020   SOBOE (shortness of breath on exertion) 12/04/2020   Vitamin D  deficiency 12/04/2020   Depression 12/04/2020   Acquired claw toe of right foot 04/01/2020   Class 3 severe obesity due to excess calories with serious comorbidity and body mass index (BMI) of 40.0 to 44.9 in adult 09/10/2019   Chest discomfort 02/02/2019   Morbid obesity (HCC) 02/02/2019   Cortical age-related cataract of both eyes 03/30/2017   Nuclear sclerotic cataract of both eyes 03/30/2017   Primary open angle glaucoma (POAG) of both eyes, indeterminate stage 03/30/2017   Type 2 diabetes mellitus with foot ulcer, with long-term current use of insulin (HCC) 03/30/2017   LVH (left ventricular hypertrophy) 03/29/2016   T wave inversion in EKG 02/16/2016   Transaminitis 02/16/2016   Pain of left great toe 08/23/2013   Hypertension associated with type 2 diabetes mellitus (HCC) 12/20/2011   Diabetes mellitus (HCC) 12/20/2011   Hyperlipidemia associated with type 2 diabetes mellitus (HCC) 12/20/2011   Glaucoma (increased eye pressure) 07/19/1996   Past Medical History:  Diagnosis Date   Acute combined systolic and diastolic heart failure (HCC) 02/16/2016   Allergy    Anxiety    Phreesia 12/27/2019   Anxiety    DDD (degenerative disc disease), cervical    Depression    Diabetes mellitus    Diabetes mellitus without complication (HCC)    Phreesia 12/27/2019   Diabetic foot ulcer (HCC)    FUO (fever of unknown origin) 02/16/2016   Glaucoma    Heartburn    Hx of adenomatous colonic polyps 08/09/2022   5 adenomas max 10 mm recall 2027   Hyperlipidemia    Hypertension     Hypertrophied anal papilla    LVH (left ventricular hypertrophy) 03/29/2016   Palpitation    Rash and nonspecific skin eruption 02/16/2016   Sleep apnea    CPAP   SOBOE (shortness of breath on exertion)    Transaminitis 02/16/2016   Past Surgical History:  Procedure Laterality Date   AMPUTATION TOE Right 04/2020   AMPUTATION TOE Right 01/2020   AMPUTATION TOE Left    Big toe   HERNIA REPAIR     SPINE SURGERY     L4/L5   TUMOR REMOVAL  12/1998   Bone Cyst   Allergies  Allergen Reactions   Other Other (See Comments)    Hayfever   Prior to Admission medications  Medication Sig Start Date End Date Taking? Authorizing Provider  aspirin 81 MG tablet Take 81 mg by mouth daily.   Yes [provider]  atorvastatin  (LIPITOR) 20 MG tablet Take 1 tablet (20 mg total) by mouth daily. 07/06/23  Yes Benjiman Bras, MD  BAQSIMI ONE PACK 3 MG/DOSE POWD as directed for severe low blood sugar reaction Nasally as needed for 90 days 03/24/23  Yes [provider]  BD PEN NEEDLE NANO U/F 32G X 4 MM MISC  07/04/18  Yes [provider]  calcium  carbonate (TUMS - DOSED IN MG ELEMENTAL CALCIUM ) 500 MG chewable tablet Chew 2 tablets by mouth daily as needed for indigestion or heartburn.   Yes [provider]  Continuous Glucose Sensor (FREESTYLE LIBRE 3 SENSOR) MISC PLACE ONE SENSOR TO THE BACK OF YOUR UPPER ARM. REPLACE EVERY 14 DAYS. for 84   Yes [provider]  dorzolamide-timolol (COSOPT) 22.3-6.8 MG/ML ophthalmic solution 1 drop 2 (two) times daily.   Yes [provider]  famotidine  (PEPCID ) 20 MG tablet Take 1 tablet (20 mg total) by mouth 2 (two) times daily. 05/09/23  Yes Soldatova, Liuba, MD  fexofenadine (ALLEGRA) 180 MG tablet Take 180 mg by mouth daily.   Yes [provider]  fluticasone  (FLONASE ) 50 MCG/ACT nasal spray PLACE 2 SPRAYS INTO BOTH NOSTRILS DAILY. 07/09/14  Yes Guest, Dorthea Gauze, MD  gabapentin  (NEURONTIN ) 300 MG  capsule Take 1 capsule (300 mg total) by mouth at bedtime. With additional midday dose if needed. 09/07/23  Yes Benjiman Bras, MD  hydrOXYzine  (ATARAX ) 25 MG tablet Take 0.5-1 tablets (12.5-25 mg total) by mouth at bedtime as needed for anxiety. 07/06/23  Yes Benjiman Bras, MD  insulin glargine-yfgn (SEMGLEE, YFGN,) 100 UNIT/ML Pen Inject 50 Units into the skin daily. (Titrate as directed up to max daily dose of 100 units) 07/20/22  Yes [provider]  ipratropium (ATROVENT ) 0.06 % nasal spray Place 1-2 sprays into both nostrils 4 (four) times daily. As needed for nasal congestion 03/30/23  Yes Benjiman Bras, MD  JARDIANCE  25 MG TABS tablet TAKE ONE TABLET BY MOUTH ONE TIME DAILY 01/06/24  Yes Benjiman Bras, MD  lisinopril  (ZESTRIL ) 40 MG tablet TAKE ONE TABLET BY MOUTH ONE TIME DAILY 05/02/23  Yes Benjiman Bras, MD  metFORMIN  (GLUCOPHAGE ) 1000 MG tablet TAKE ONE TABLET BY MOUTH TWICE A DAY 07/06/23  Yes Benjiman Bras, MD  Multiple Vitamin (MULTIVITAMIN) tablet Take 1 tablet by mouth every evening.    Yes [provider]  sertraline  (ZOLOFT ) 100 MG tablet TAKE ONE AND ONE-HALF TABLETS BY MOUTH ONE TIME DAILY 06/29/23  Yes Benjiman Bras, MD  sildenafil  (VIAGRA ) 100 MG tablet TAKE ONE-HALF TO ONE TABLET BY MOUTH ONE TIME DAILY AS NEEDED FOR ERECTILE DYSFUNCTION 03/07/23  Yes Benjiman Bras, MD  tirzepatide  (MOUNJARO ) 15 MG/0.5ML Pen Inject 15 mg Subcutaneously once a week 90 days 06/17/22  Yes   Travoprost, BAK Free, (TRAVATAN) 0.004 % SOLN ophthalmic solution SMARTSIG:In Eye(s) 01/28/21  Yes [provider]  Vitamin D , Ergocalciferol , (DRISDOL ) 1.25 MG (50000 UNIT) CAPS capsule Take 1 capsule (50,000 Units total) by mouth every 7 (seven) days. 09/13/22  Yes Whitmire, Dawn, FNP  benzonatate  (TESSALON ) 100 MG capsule Take 1 capsule (100 mg total) by mouth 3 (three) times daily as needed for cough. 12/07/23   Benjiman Bras, MD   Social History    Socioeconomic History   Marital status: Married  Spouse name: Yojan Paskett   Number of children: 2   Years of education: 16   Highest education level: Bachelor's degree (e.g., BA, AB, BS)  Occupational History   Occupation: Education officer, museum  Tobacco Use   Smoking status: Never   Smokeless tobacco: Never  Vaping Use   Vaping status: Never Used  Substance and Sexual Activity   Alcohol use: No    Alcohol/week: 0.0 standard drinks of alcohol    Comment: per pt 1 drink once in a long while   Drug use: No   Sexual activity: Yes    Partners: Female  Other Topics Concern   Not on file  Social History Narrative   Married. Education: [employer].    Social Drivers of Health   Financial Resource Strain: Medium Risk (01/05/2024)   Overall Financial Resource Strain (CARDIA)    Difficulty of Paying Living Expenses: Somewhat hard  Food Insecurity: No Food Insecurity (01/05/2024)   Hunger Vital Sign    Worried About Running Out of Food in the Last Year: Never true    Ran Out of Food in the Last Year: Never true  Transportation Needs: No Transportation Needs (01/05/2024)   PRAPARE - Administrator, Civil Service (Medical): No    Lack of Transportation (Non-Medical): No  Physical Activity: Inactive (01/05/2024)   Exercise Vital Sign    Days of Exercise per Week: 0 days    Minutes of Exercise per Session: Not on file  Stress: No Stress Concern Present (01/05/2024)   [employer] of Occupational Health - Occupational Stress Questionnaire    Feeling of Stress: Only a little  Social Connections: Socially Isolated (01/05/2024)   Social Connection and Isolation Panel    Frequency of Communication with Friends and Family: Never    Frequency of Social Gatherings with Friends and Family: Once a week    Attends Religious Services: Never    Database administrator or Organizations: No    Attends Engineer, structural: Not on file    Marital Status: Married  Careers information officer Violence: Not on file    Review of Systems  Constitutional:  Negative for fatigue and unexpected weight change.  Eyes:  Negative for visual disturbance.  Respiratory:  Positive for cough. Negative for chest tightness and shortness of breath.   Cardiovascular:  Negative for chest pain, palpitations and leg swelling.  Gastrointestinal:  Negative for abdominal pain and blood in stool.  Neurological:  Negative for dizziness, light-headedness and headaches.     Objective:   Vitals:   01/06/24 0843  BP: (!) 144/78  Pulse: 85  Temp: 98.2 F (36.8 C)  TempSrc: Temporal  SpO2: 98%  Weight: 283 lb (128.4 kg)     Physical Exam Vitals reviewed.  Constitutional:      Appearance: He is well-developed.  HENT:     Head: Normocephalic and atraumatic.  Neck:     Vascular: No carotid bruit or JVD.   Cardiovascular:     Rate and Rhythm: Normal rate and regular rhythm.     Heart sounds: Normal heart sounds. No murmur heard. Pulmonary:     Effort: Pulmonary effort is normal.     Breath sounds: Normal breath sounds. No rales.   Musculoskeletal:     Right lower leg: No edema.     Left lower leg: No edema.   Skin:    General: Skin is warm and dry.   Neurological:     Mental Status: He is alert  and oriented to person, place, and time.   Psychiatric:        Mood and Affect: Mood normal.        Assessment & Plan:  Brandon Rich is a 63 y.o. male . Acute cough LRTI (lower respiratory tract infection)  - Cough has improved.  Will continue to monitor for improvement with RTC precautions given if any persistent cough or productive cough in the next few weeks.  Lungs were clear on exam.  Anxiety state - Plan: sertraline  (ZOLOFT ) 100 MG tablet Adjustment disorder with mixed anxiety and depressed mood - Plan: hydrOXYzine  (ATARAX ) 25 MG tablet  - Anxiety stable, will continue same dosing of sertraline  for now along with Atarax  as needed for sleep/breakthrough  symptoms.  Essential hypertension - Plan: atorvastatin  (LIPITOR) 20 MG tablet, lisinopril  (ZESTRIL ) 40 MG tablet, metFORMIN  (GLUCOPHAGE ) 1000 MG tablet  - Stable with current regimen, check labs and adjust plan accordingly  Hyperlipidemia, unspecified hyperlipidemia type - Plan: atorvastatin  (LIPITOR) 20 MG tablet, Comprehensive metabolic panel with GFR, Lipid panel  - Tolerating Lipitor, check labs and adjust plan accordingly.  Type 2 diabetes mellitus with diabetic polyneuropathy, with long-term current use of insulin (HCC) - Plan: gabapentin  (NEURONTIN ) 300 MG capsule  - Continued on gabapentin , metformin , followed by endocrinology.  Adjustment of meds, plan per endocrine.  Psychophysiological insomnia - Plan: hydrOXYzine  (ATARAX ) 25 MG tablet  - As above, hydroxyzine  as needed.  Erectile dysfunction, unspecified erectile dysfunction type  - Stable with current dose of Viagra  with potential side effects and risk discussed.  Lowest effective dose.  Need for pneumococcal vaccination - Plan: Pneumococcal conjugate vaccine 20-valent (Prevnar 20)   Meds ordered this encounter  Medications   sertraline  (ZOLOFT ) 100 MG tablet    Sig: Take 1 tablet (100 mg total) by mouth daily.    Dispense:  135 tablet    Refill:  1   atorvastatin  (LIPITOR) 20 MG tablet    Sig: Take 1 tablet (20 mg total) by mouth daily.    Dispense:  90 tablet    Refill:  1   gabapentin  (NEURONTIN ) 300 MG capsule    Sig: Take 1 capsule (300 mg total) by mouth at bedtime. With additional midday dose if needed.    Dispense:  135 capsule    Refill:  1   hydrOXYzine  (ATARAX ) 25 MG tablet    Sig: Take 0.5-1 tablets (12.5-25 mg total) by mouth at bedtime as needed for anxiety.    Dispense:  90 tablet    Refill:  1   lisinopril  (ZESTRIL ) 40 MG tablet    Sig: Take 1 tablet (40 mg total) by mouth daily.    Dispense:  90 tablet    Refill:  2   metFORMIN  (GLUCOPHAGE ) 1000 MG tablet    Sig: TAKE ONE TABLET BY MOUTH TWICE  A DAY    Dispense:  180 tablet    Refill:  1   Patient Instructions  I expect cough to continue to improve, but follow up in next few weeks if not.  Thank you for coming in today. No change in medications at this time. If there are any concerns on your bloodwork, I will let you know. Take care!     Signed,   Caro Christmas, MD [facility], [facility] 01/06/24 9:16 AM

## 2024-01-06 NOTE — Patient Instructions (Signed)
 I expect cough to continue to improve, but follow up in next few weeks if not.  Thank you for coming in today. No change in medications at this time. If there are any concerns on your bloodwork, I will let you know. Take care!

## 2024-01-09 ENCOUNTER — Ambulatory Visit (INDEPENDENT_AMBULATORY_CARE_PROVIDER_SITE_OTHER): Payer: Managed Care, Other (non HMO) | Admitting: Otolaryngology

## 2024-01-11 ENCOUNTER — Ambulatory Visit: Payer: Self-pay | Admitting: Family Medicine

## 2024-04-19 ENCOUNTER — Other Ambulatory Visit: Payer: Self-pay | Admitting: Family Medicine

## 2024-04-19 DIAGNOSIS — E119 Type 2 diabetes mellitus without complications: Secondary | ICD-10-CM

## 2024-06-25 ENCOUNTER — Ambulatory Visit: Admitting: Family Medicine

## 2024-06-25 ENCOUNTER — Ambulatory Visit: Payer: Self-pay

## 2024-06-25 ENCOUNTER — Encounter: Payer: Self-pay | Admitting: Family Medicine

## 2024-06-25 VITALS — BP 134/66 | HR 71 | Temp 98.3°F | Resp 18 | Ht 68.0 in | Wt 280.0 lb

## 2024-06-25 DIAGNOSIS — J019 Acute sinusitis, unspecified: Secondary | ICD-10-CM

## 2024-06-25 MED ORDER — AMOXICILLIN-POT CLAVULANATE 875-125 MG PO TABS: 1.0000 | ORAL_TABLET | Freq: Two times a day (BID) | ORAL | 0 refills | Status: DC

## 2024-06-25 NOTE — Telephone Encounter (Signed)
FYI patient has appt today

## 2024-06-25 NOTE — Patient Instructions (Addendum)
 Based on your symptoms, I do suspect a possible sinus infection.  Start augmentin , drink fluids, saline nasal spray, other info below. Follow up if not improving in next week or two or continues to recur.    Sinus Infection, Adult A sinus infection, also called sinusitis, is inflammation of your sinuses. Sinuses are hollow spaces in the bones around your face. Your sinuses are located: Around your eyes. In the middle of your forehead. Behind your nose. In your cheekbones. Mucus normally drains out of your sinuses. When your nasal tissues become inflamed or swollen, mucus can become trapped or blocked. This allows bacteria, viruses, and fungi to grow, which leads to infection. Most infections of the sinuses are caused by a virus. A sinus infection can develop quickly. It can last for up to 4 weeks (acute) or for more than 12 weeks (chronic). A sinus infection often develops after a cold. What are the causes? This condition is caused by anything that creates swelling in the sinuses or stops mucus from draining. This includes: Allergies. Asthma. Infection from bacteria or viruses. Deformities or blockages in your nose or sinuses. Abnormal growths in the nose (nasal polyps). Pollutants, such as chemicals or irritants in the air. Infection from fungi. This is rare. What increases the risk? You are more likely to develop this condition if you: Have a weak body defense system (immune system). Do a lot of swimming or diving. Overuse nasal sprays. Smoke. What are the signs or symptoms? The main symptoms of this condition are pain and a feeling of pressure around the affected sinuses. Other symptoms include: Stuffy nose or congestion that makes it difficult to breathe through your nose. Thick yellow or greenish drainage from your nose. Tenderness, swelling, and warmth over the affected sinuses. A cough that may get worse at night. Decreased sense of smell and taste. Extra mucus that collects  in the throat or the back of the nose (postnasal drip) causing a sore throat or bad breath. Tiredness (fatigue). Fever. How is this diagnosed? This condition is diagnosed based on: Your symptoms. Your medical history. A physical exam. Tests to find out if your condition is acute or chronic. This may include: Checking your nose for nasal polyps. Viewing your sinuses using a device that has a light (endoscope). Testing for allergies or bacteria. Imaging tests, such as an MRI or CT scan. In rare cases, a bone biopsy may be done to rule out more serious types of fungal sinus disease. How is this treated? Treatment for a sinus infection depends on the cause and whether your condition is chronic or acute. If caused by a virus, your symptoms should go away on their own within 10 days. You may be given medicines to relieve symptoms. They include: Medicines that shrink swollen nasal passages (decongestants). A spray that eases inflammation of the nostrils (topical intranasal corticosteroids). Rinses that help get rid of thick mucus in your nose (nasal saline washes). Medicines that treat allergies (antihistamines). Over-the-counter pain relievers. If caused by bacteria, your health care provider may recommend waiting to see if your symptoms improve. Most bacterial infections will get better without antibiotic medicine. You may be given antibiotics if you have: A severe infection. A weak immune system. If caused by narrow nasal passages or nasal polyps, surgery may be needed. Follow these instructions at home: Medicines Take, use, or apply over-the-counter and prescription medicines only as told by your health care provider. These may include nasal sprays. If you were prescribed an antibiotic medicine,  take it as told by your health care provider. Do not stop taking the antibiotic even if you start to feel better. Hydrate and humidify  Drink enough fluid to keep your urine pale yellow. Staying  hydrated will help to thin your mucus. Use a cool mist humidifier to keep the humidity level in your home above 50%. Inhale steam for 10-15 minutes, 3-4 times a day, or as told by your health care provider. You can do this in the bathroom while a hot shower is running. Limit your exposure to cool or dry air. Rest Rest as much as possible. Sleep with your head raised (elevated). Make sure you get enough sleep each night. General instructions  Apply a warm, moist washcloth to your face 3-4 times a day or as told by your health care provider. This will help with discomfort. Use nasal saline washes as often as told by your health care provider. Wash your hands often with soap and water to reduce your exposure to germs. If soap and water are not available, use hand sanitizer. Do not smoke. Avoid being around people who are smoking (secondhand smoke). Keep all follow-up visits. This is important. Contact a health care provider if: You have a fever. Your symptoms get worse. Your symptoms do not improve within 10 days. Get help right away if: You have a severe headache. You have persistent vomiting. You have severe pain or swelling around your face or eyes. You have vision problems. You develop confusion. Your neck is stiff. You have trouble breathing. These symptoms may be an emergency. Get help right away. Call 911. Do not wait to see if the symptoms will go away. Do not drive yourself to the hospital. Summary A sinus infection is soreness and inflammation of your sinuses. Sinuses are hollow spaces in the bones around your face. This condition is caused by nasal tissues that become inflamed or swollen. The swelling traps or blocks the flow of mucus. This allows bacteria, viruses, and fungi to grow, which leads to infection. If you were prescribed an antibiotic medicine, take it as told by your health care provider. Do not stop taking the antibiotic even if you start to feel better. Keep  all follow-up visits. This is important. This information is not intended to replace advice given to you by your health care provider. Make sure you discuss any questions you have with your health care provider. Document Revised: 06/09/2021 Document Reviewed: 06/09/2021 Elsevier Patient Education  2024 [employer].

## 2024-06-25 NOTE — Telephone Encounter (Signed)
.  FYI Only or Action Required?: FYI only for provider: appointment scheduled on today.  Patient was last seen in primary care on 01/06/2024 by Levora Reyes SAUNDERS, MD.  Called Nurse Triage reporting No chief complaint on file.Cough  Symptoms began several months ago.  Interventions attempted: Other:  .  Symptoms are: unchanged.  Triage Disposition: See PCP When Office is Open (Within 3 Days)  Patient/caregiver understands and will follow disposition?: Yes.                       Copied from CRM [id number]. Topic: Clinical - Red Word Triage >> Jun 25, 2024 11:34 AM Carlyon D wrote: Red Word that prompted transfer to Nurse Triage: coughing, coughing up phlegm thick dark color very persistent, no energy, fatigue Reason for Disposition  Cough has been present for > 3 weeks  Answer Assessment - Initial Assessment Questions 1. ONSET: When did the cough begin?      months 2. SEVERITY: How bad is the cough today?      moderate 3. SPUTUM: Describe the color of your sputum (e.g., none, dry cough; clear, white, yellow, green)     Green and gray 4. HEMOPTYSIS: Are you coughing up any blood? If Yes, ask: How much? (e.g., flecks, streaks, tablespoons, etc.)     no 5. DIFFICULTY BREATHING: Are you having difficulty breathing? If Yes, ask: How bad is it? (e.g., mild, moderate, severe)      No - but congestion 6. FEVER: Do you have a fever? If Yes, ask: What is your temperature, how was it measured, and when did it start?     Felt like he did - but temp was normal 7. CARDIAC HISTORY: Do you have any history of heart disease? (e.g., heart attack, congestive heart failure)      HTN 8. LUNG HISTORY: Do you have any history of lung disease?  (e.g., pulmonary embolus, asthma, emphysema)     no  10. OTHER SYMPTOMS: Do you have any other symptoms? (e.g., runny nose, wheezing, chest pain)       Sinus blocked , tired rung out  Protocols used: Cough - Acute  Productive-A-AH

## 2024-06-25 NOTE — Progress Notes (Signed)
 Subjective:  Patient ID: Brandon Rich, male    DOB: 1960-12-16  Age: 63 y.o. MRN: [id number]  CC:  Chief Complaint  Patient presents with   Cough    Cough, sinus congestion, thick green mucus, coughing fits, no fever or chills. Slight sore throat. Using a CPAP  Sx started off and on a couple months ago. Sx have gotten worse.     HPI Brandon Rich presents for   Cough, sinus congestion Off and on past few months, green nasal d/c. Lasts a week to 10 days, improves, then returns. Current sx's 2 weeks - not improving - more discolored mucus. No face pain, no tooth pain. Slight sinus HA. No fever.  No otc treatment. No cough or chest congestion.  Some fatigue. Uses cpap.   History Patient Active Problem List   Diagnosis Date Noted   Morbid obesity with body mass index (BMI) of 40.0 to 44.9 in adult [facility]) 04/05/2023   Class 3 severe obesity with serious comorbidity and body mass index (BMI) of 45.0 to 49.9 in adult (HCC) 08/16/2022   Hx of adenomatous colonic polyps 08/09/2022   Type 2 diabetes mellitus with obesity 07/05/2022   At risk for hyperglycemia 03/15/2022   Polyphagia 08/26/2021   Acquired hallux rigidus of left foot 04/14/2021   Nonalcoholic hepatosteatosis 12/22/2020   Other fatigue 12/04/2020   SOBOE (shortness of breath on exertion) 12/04/2020   Vitamin D  deficiency 12/04/2020   Depression 12/04/2020   Acquired claw toe of right foot 04/01/2020   Pain in left elbow 09/18/2019   Class 3 severe obesity due to excess calories with serious comorbidity and body mass index (BMI) of 40.0 to 44.9 in adult (HCC) 09/10/2019   Chest discomfort 02/02/2019   Morbid obesity (HCC) 02/02/2019   Cortical age-related cataract of both eyes 03/30/2017   Nuclear sclerotic cataract of both eyes 03/30/2017   Primary open angle glaucoma (POAG) of both eyes, indeterminate stage 03/30/2017   Type 2 diabetes mellitus with foot ulcer, with long-term current use of insulin (HCC)  03/30/2017   LVH (left ventricular hypertrophy) 03/29/2016   T wave inversion in EKG 02/16/2016   Transaminitis 02/16/2016   Pain of left great toe 08/23/2013   Hypertension associated with type 2 diabetes mellitus (HCC) 12/20/2011   Diabetes mellitus (HCC) 12/20/2011   Hyperlipidemia associated with type 2 diabetes mellitus (HCC) 12/20/2011   Glaucoma (increased eye pressure) 07/19/1996   Past Medical History:  Diagnosis Date   Acute combined systolic and diastolic heart failure (HCC) 02/16/2016   Allergy    Anxiety    Phreesia 12/27/2019   Anxiety    Arthritis 2019   DDD (degenerative disc disease), cervical    Depression    Diabetes mellitus    Diabetes mellitus without complication (HCC)    Phreesia 12/27/2019   Diabetic foot ulcer (HCC)    FUO (fever of unknown origin) 02/16/2016   Glaucoma    Heart murmur 2003   Heartburn    Hx of adenomatous colonic polyps 08/09/2022   5 adenomas max 10 mm recall 2027   Hyperlipidemia    Hypertension    Hypertrophied anal papilla    LVH (left ventricular hypertrophy) 03/29/2016   Palpitation    Rash and nonspecific skin eruption 02/16/2016   Sleep apnea    CPAP   SOBOE (shortness of breath on exertion)    Transaminitis 02/16/2016   Past Surgical History:  Procedure Laterality Date   AMPUTATION TOE Right 04/2020  AMPUTATION TOE Right 01/2020   AMPUTATION TOE Left    Big toe   HERNIA REPAIR     SPINE SURGERY     L4/L5   TUMOR REMOVAL  12/1998   Bone Cyst   Allergies  Allergen Reactions   Other Other (See Comments)    Hayfever   Prior to Admission medications   Medication Sig Start Date End Date Taking? Authorizing Provider  aspirin 81 MG tablet Take 81 mg by mouth daily.   Yes [provider]  atorvastatin  (LIPITOR) 20 MG tablet Take 1 tablet (20 mg total) by mouth daily. 01/06/24  Yes Levora Reyes SAUNDERS, MD  BAQSIMI ONE PACK 3 MG/DOSE POWD as directed for severe low blood sugar reaction Nasally as needed for  90 days 03/24/23  Yes [provider]  BD PEN NEEDLE NANO U/F 32G X 4 MM MISC  07/04/18  Yes [provider]  calcium  carbonate (TUMS - DOSED IN MG ELEMENTAL CALCIUM ) 500 MG chewable tablet Chew 2 tablets by mouth daily as needed for indigestion or heartburn.   Yes [provider]  Continuous Glucose Sensor (FREESTYLE LIBRE 3 SENSOR) MISC PLACE ONE SENSOR TO THE BACK OF YOUR UPPER ARM. REPLACE EVERY 14 DAYS. for 84   Yes [provider]  dorzolamide-timolol (COSOPT) 22.3-6.8 MG/ML ophthalmic solution 1 drop 2 (two) times daily.   Yes [provider]  famotidine  (PEPCID ) 20 MG tablet Take 1 tablet (20 mg total) by mouth 2 (two) times daily. 05/09/23  Yes Soldatova, Liuba, MD  fexofenadine (ALLEGRA) 180 MG tablet Take 180 mg by mouth daily.   Yes [provider]  fluticasone  (FLONASE ) 50 MCG/ACT nasal spray PLACE 2 SPRAYS INTO BOTH NOSTRILS DAILY. 07/09/14  Yes Guest, Medford ORN, MD  gabapentin  (NEURONTIN ) 300 MG capsule Take 1 capsule (300 mg total) by mouth at bedtime. With additional midday dose if needed. 01/06/24  Yes Levora Reyes SAUNDERS, MD  hydrOXYzine  (ATARAX ) 25 MG tablet Take 0.5-1 tablets (12.5-25 mg total) by mouth at bedtime as needed for anxiety. 01/06/24  Yes Levora Reyes SAUNDERS, MD  insulin glargine-yfgn (SEMGLEE, YFGN,) 100 UNIT/ML Pen Inject 50 Units into the skin daily. (Titrate as directed up to max daily dose of 100 units) 07/20/22  Yes [provider]  ipratropium (ATROVENT ) 0.06 % nasal spray Place 1-2 sprays into both nostrils 4 (four) times daily. As needed for nasal congestion 03/30/23  Yes Levora Reyes SAUNDERS, MD  JARDIANCE  25 MG TABS tablet TAKE ONE TABLET BY MOUTH ONE TIME DAILY 04/19/24  Yes Levora Reyes SAUNDERS, MD  lisinopril  (ZESTRIL ) 40 MG tablet Take 1 tablet (40 mg total) by mouth daily. 01/06/24  Yes Levora Reyes SAUNDERS, MD  metFORMIN  (GLUCOPHAGE ) 1000 MG tablet TAKE ONE TABLET BY MOUTH TWICE A DAY 01/06/24  Yes Levora Reyes SAUNDERS, MD  sertraline  (ZOLOFT ) 100 MG tablet Take 1 tablet (100 mg total) by mouth daily. 01/06/24  Yes Levora Reyes SAUNDERS, MD  sildenafil  (VIAGRA ) 100 MG tablet TAKE ONE-HALF TO ONE TABLET BY MOUTH ONE TIME DAILY AS NEEDED FOR ERECTILE DYSFUNCTION 03/07/23  Yes Levora Reyes SAUNDERS, MD  tirzepatide  (MOUNJARO ) 15 MG/0.5ML Pen Inject 15 mg Subcutaneously once a week 90 days 06/17/22  Yes   Travoprost, BAK Free, (TRAVATAN) 0.004 % SOLN ophthalmic solution SMARTSIG:In Eye(s) 01/28/21  Yes [provider]  Vitamin D , Ergocalciferol , (DRISDOL ) 1.25 MG (50000 UNIT) CAPS capsule Take 1 capsule (50,000 Units total) by mouth every 7 (seven) days. 09/13/22  Yes Whitmire, Dawn, FNP  Multiple Vitamin (MULTIVITAMIN) tablet Take 1 tablet by mouth every evening.  Patient not taking: Reported on 06/25/2024    [provider]   Social History   Socioeconomic History   Marital status: Married    Spouse name: Shawnn Bouillon   Number of children: 2   Years of education: 16   Highest education level: Bachelor's degree (e.g., BA, AB, BS)  Occupational History   Occupation: Education Officer, [employer]  Tobacco Use   Smoking status: Never   Smokeless tobacco: Never  Vaping Use   Vaping status: Never Used  Substance and Sexual Activity   Alcohol use: No    Alcohol/week: 0.0 standard drinks of alcohol    Comment: per pt 1 drink once in a long while   Drug use: No   Sexual activity: Yes    Partners: Female  Other Topics Concern   Not on file  Social History Narrative   Married. Education: [employer].    Social Drivers of Health   Financial Resource Strain: Medium Risk (01/05/2024)   Overall Financial Resource Strain (CARDIA)    Difficulty of Paying Living Expenses: Somewhat hard  Food Insecurity: No Food Insecurity (01/05/2024)   Hunger Vital Sign    Worried About Running Out of Food in the Last Year: Never true    Ran Out of Food in the Last Year: Never true  Transportation Needs: No Transportation Needs  (01/05/2024)   PRAPARE - Administrator, Civil Service (Medical): No    Lack of Transportation (Non-Medical): No  Physical Activity: Inactive (01/05/2024)   Exercise Vital Sign    Days of Exercise per Week: 0 days    Minutes of Exercise per Session: Not on file  Stress: No Stress Concern Present (01/05/2024)   [employer] of Occupational Health - Occupational Stress Questionnaire    Feeling of Stress: Only a little  Social Connections: Socially Isolated (01/05/2024)   Social Connection and Isolation Panel    Frequency of Communication with Friends and Family: Never    Frequency of Social Gatherings with Friends and Family: Once a week    Attends Religious Services: Never    Database Administrator or Organizations: No    Attends Engineer, Structural: Not on file    Marital Status: Married  Catering Manager Violence: Not on file    Review of Systems   Objective:   Vitals:   06/25/24 1436  BP: 134/66  Pulse: 71  Resp: 18  Temp: 98.3 F (36.8 C)  TempSrc: Temporal  SpO2: 97%  Weight: 280 lb (127 kg)  Height: 5' 8 (1.727 m)     Physical Exam Vitals reviewed.  Constitutional:      Appearance: He is well-developed.  HENT:     Head: Normocephalic and atraumatic.     Right Ear: Tympanic membrane, ear canal and external ear normal.     Left Ear: Tympanic membrane, ear canal and external ear normal.     Nose: No rhinorrhea.     Comments: Sinuses nontender.     Mouth/Throat:     Mouth: Mucous membranes are moist.     Pharynx: No oropharyngeal exudate or posterior oropharyngeal erythema.  Eyes:     Conjunctiva/sclera: Conjunctivae normal.     Pupils: Pupils are equal, round, and reactive to light.  Cardiovascular:     Rate and Rhythm: Normal rate and regular rhythm.     Heart sounds: Normal heart sounds. No murmur heard. Pulmonary:     Effort:  Pulmonary effort is normal.     Breath sounds: Normal breath sounds. No wheezing, rhonchi or rales.   Abdominal:     Palpations: Abdomen is soft.     Tenderness: There is no abdominal tenderness.  Musculoskeletal:     Cervical back: Neck supple.  Lymphadenopathy:     Cervical: No cervical adenopathy.  Skin:    General: Skin is warm and dry.     Findings: No rash.  Neurological:     Mental Status: He is alert and oriented to person, place, and time.  Psychiatric:        Behavior: Behavior normal.        Assessment & Plan:  Brandon Rich is a 63 y.o. male . Acute sinusitis, recurrence not specified, unspecified location - Plan: amoxicillin -clavulanate (AUGMENTIN ) 875-125 MG tablet  - suspected sinusitis, acute on chronic?  Symptomatic care discussed with saline nasal spray, fluids and start Augmentin  with potential side effects discussed, RTC precautions if recurrent or worsening.  Meds ordered this encounter  Medications   amoxicillin -clavulanate (AUGMENTIN ) 875-125 MG tablet    Sig: Take 1 tablet by mouth 2 (two) times daily.    Dispense:  20 tablet    Refill:  0   Patient Instructions  Based on your symptoms, I do suspect a possible sinus infection.  Start augmentin , drink fluids, saline nasal spray, other info below. Follow up if not improving in next week or two or continues to recur.    Sinus Infection, Adult A sinus infection, also called sinusitis, is inflammation of your sinuses. Sinuses are hollow spaces in the bones around your face. Your sinuses are located: Around your eyes. In the middle of your forehead. Behind your nose. In your cheekbones. Mucus normally drains out of your sinuses. When your nasal tissues become inflamed or swollen, mucus can become trapped or blocked. This allows bacteria, viruses, and fungi to grow, which leads to infection. Most infections of the sinuses are caused by a virus. A sinus infection can develop quickly. It can last for up to 4 weeks (acute) or for more than 12 weeks (chronic). A sinus infection often develops after a  cold. What are the causes? This condition is caused by anything that creates swelling in the sinuses or stops mucus from draining. This includes: Allergies. Asthma. Infection from bacteria or viruses. Deformities or blockages in your nose or sinuses. Abnormal growths in the nose (nasal polyps). Pollutants, such as chemicals or irritants in the air. Infection from fungi. This is rare. What increases the risk? You are more likely to develop this condition if you: Have a weak body defense system (immune system). Do a lot of swimming or diving. Overuse nasal sprays. Smoke. What are the signs or symptoms? The main symptoms of this condition are pain and a feeling of pressure around the affected sinuses. Other symptoms include: Stuffy nose or congestion that makes it difficult to breathe through your nose. Thick yellow or greenish drainage from your nose. Tenderness, swelling, and warmth over the affected sinuses. A cough that may get worse at night. Decreased sense of smell and taste. Extra mucus that collects in the throat or the back of the nose (postnasal drip) causing a sore throat or bad breath. Tiredness (fatigue). Fever. How is this diagnosed? This condition is diagnosed based on: Your symptoms. Your medical history. A physical exam. Tests to find out if your condition is acute or chronic. This may include: Checking your nose for nasal polyps. Viewing your sinuses  using a device that has a light (endoscope). Testing for allergies or bacteria. Imaging tests, such as an MRI or CT scan. In rare cases, a bone biopsy may be done to rule out more serious types of fungal sinus disease. How is this treated? Treatment for a sinus infection depends on the cause and whether your condition is chronic or acute. If caused by a virus, your symptoms should go away on their own within 10 days. You may be given medicines to relieve symptoms. They include: Medicines that shrink swollen nasal  passages (decongestants). A spray that eases inflammation of the nostrils (topical intranasal corticosteroids). Rinses that help get rid of thick mucus in your nose (nasal saline washes). Medicines that treat allergies (antihistamines). Over-the-counter pain relievers. If caused by bacteria, your health care provider may recommend waiting to see if your symptoms improve. Most bacterial infections will get better without antibiotic medicine. You may be given antibiotics if you have: A severe infection. A weak immune system. If caused by narrow nasal passages or nasal polyps, surgery may be needed. Follow these instructions at home: Medicines Take, use, or apply over-the-counter and prescription medicines only as told by your health care provider. These may include nasal sprays. If you were prescribed an antibiotic medicine, take it as told by your health care provider. Do not stop taking the antibiotic even if you start to feel better. Hydrate and humidify  Drink enough fluid to keep your urine pale yellow. Staying hydrated will help to thin your mucus. Use a cool mist humidifier to keep the humidity level in your home above 50%. Inhale steam for 10-15 minutes, 3-4 times a day, or as told by your health care provider. You can do this in the bathroom while a hot shower is running. Limit your exposure to cool or dry air. Rest Rest as much as possible. Sleep with your head raised (elevated). Make sure you get enough sleep each night. General instructions  Apply a warm, moist washcloth to your face 3-4 times a day or as told by your health care provider. This will help with discomfort. Use nasal saline washes as often as told by your health care provider. Wash your hands often with soap and water to reduce your exposure to germs. If soap and water are not available, use hand sanitizer. Do not smoke. Avoid being around people who are smoking (secondhand smoke). Keep all follow-up visits. This  is important. Contact a health care provider if: You have a fever. Your symptoms get worse. Your symptoms do not improve within 10 days. Get help right away if: You have a severe headache. You have persistent vomiting. You have severe pain or swelling around your face or eyes. You have vision problems. You develop confusion. Your neck is stiff. You have trouble breathing. These symptoms may be an emergency. Get help right away. Call 911. Do not wait to see if the symptoms will go away. Do not drive yourself to the hospital. Summary A sinus infection is soreness and inflammation of your sinuses. Sinuses are hollow spaces in the bones around your face. This condition is caused by nasal tissues that become inflamed or swollen. The swelling traps or blocks the flow of mucus. This allows bacteria, viruses, and fungi to grow, which leads to infection. If you were prescribed an antibiotic medicine, take it as told by your health care provider. Do not stop taking the antibiotic even if you start to feel better. Keep all follow-up visits. This is important.  This information is not intended to replace advice given to you by your health care provider. Make sure you discuss any questions you have with your health care provider. Document Revised: 06/09/2021 Document Reviewed: 06/09/2021 Elsevier Patient Education  2024 [employer]     Signed,   Reyes Pines, MD [facility], [facility] 06/25/24 3:06 PM

## 2024-07-26 ENCOUNTER — Other Ambulatory Visit: Payer: Self-pay | Admitting: Family Medicine

## 2024-07-26 DIAGNOSIS — E119 Type 2 diabetes mellitus without complications: Secondary | ICD-10-CM

## 2024-07-30 ENCOUNTER — Ambulatory Visit (INDEPENDENT_AMBULATORY_CARE_PROVIDER_SITE_OTHER): Admitting: Family Medicine

## 2024-07-30 VITALS — BP 120/64 | HR 85 | Temp 97.7°F | Resp 16 | Ht 68.0 in | Wt 276.8 lb

## 2024-07-30 DIAGNOSIS — F5104 Psychophysiologic insomnia: Secondary | ICD-10-CM

## 2024-07-30 DIAGNOSIS — N529 Male erectile dysfunction, unspecified: Secondary | ICD-10-CM

## 2024-07-30 DIAGNOSIS — I10 Essential (primary) hypertension: Secondary | ICD-10-CM

## 2024-07-30 DIAGNOSIS — E1142 Type 2 diabetes mellitus with diabetic polyneuropathy: Secondary | ICD-10-CM

## 2024-07-30 DIAGNOSIS — Z794 Long term (current) use of insulin: Secondary | ICD-10-CM

## 2024-07-30 DIAGNOSIS — F411 Generalized anxiety disorder: Secondary | ICD-10-CM | POA: Diagnosis not present

## 2024-07-30 DIAGNOSIS — E119 Type 2 diabetes mellitus without complications: Secondary | ICD-10-CM | POA: Diagnosis not present

## 2024-07-30 DIAGNOSIS — E785 Hyperlipidemia, unspecified: Secondary | ICD-10-CM

## 2024-07-30 DIAGNOSIS — Z0000 Encounter for general adult medical examination without abnormal findings: Secondary | ICD-10-CM | POA: Diagnosis not present

## 2024-07-30 DIAGNOSIS — F4323 Adjustment disorder with mixed anxiety and depressed mood: Secondary | ICD-10-CM | POA: Diagnosis not present

## 2024-07-30 LAB — CBC
HCT: 46.1 % (ref 39.0–52.0)
Hemoglobin: 15.2 g/dL (ref 13.0–17.0)
MCHC: 33 g/dL (ref 30.0–36.0)
MCV: 80.6 fl (ref 78.0–100.0)
Platelets: 241 K/uL (ref 150.0–400.0)
RBC: 5.72 Mil/uL (ref 4.22–5.81)
RDW: 15.1 % (ref 11.5–15.5)
WBC: 8.4 K/uL (ref 4.0–10.5)

## 2024-07-30 LAB — LIPID PANEL
Cholesterol: 119 mg/dL (ref 28–200)
HDL: 27.5 mg/dL — ABNORMAL LOW
LDL Cholesterol: 56 mg/dL (ref 10–99)
NonHDL: 91.38
Total CHOL/HDL Ratio: 4
Triglycerides: 178 mg/dL — ABNORMAL HIGH (ref 10.0–149.0)
VLDL: 35.6 mg/dL (ref 0.0–40.0)

## 2024-07-30 LAB — COMPREHENSIVE METABOLIC PANEL WITH GFR
ALT: 15 U/L (ref 3–53)
AST: 33 U/L (ref 5–37)
Albumin: 4.6 g/dL (ref 3.5–5.2)
Alkaline Phosphatase: 73 U/L (ref 39–117)
BUN: 15 mg/dL (ref 6–23)
CO2: 25 meq/L (ref 19–32)
Calcium: 9.4 mg/dL (ref 8.4–10.5)
Chloride: 102 meq/L (ref 96–112)
Creatinine, Ser: 1.05 mg/dL (ref 0.40–1.50)
GFR: 74.98 mL/min
Glucose, Bld: 186 mg/dL — ABNORMAL HIGH (ref 70–99)
Potassium: 3.9 meq/L (ref 3.5–5.1)
Sodium: 138 meq/L (ref 135–145)
Total Bilirubin: 0.4 mg/dL (ref 0.2–1.2)
Total Protein: 7.7 g/dL (ref 6.0–8.3)

## 2024-07-30 LAB — TSH: TSH: 1.39 u[IU]/mL (ref 0.35–5.50)

## 2024-07-30 MED ORDER — LISINOPRIL 40 MG PO TABS: 40.0000 mg | ORAL_TABLET | Freq: Every day | ORAL | 2 refills | Status: AC

## 2024-07-30 MED ORDER — ATORVASTATIN CALCIUM 20 MG PO TABS: 20.0000 mg | ORAL_TABLET | Freq: Every day | ORAL | 1 refills | Status: AC

## 2024-07-30 MED ORDER — EMPAGLIFLOZIN 25 MG PO TABS: 25.0000 mg | ORAL_TABLET | Freq: Every day | ORAL | 5 refills | Status: AC

## 2024-07-30 MED ORDER — HYDROXYZINE HCL 25 MG PO TABS: 12.5000 mg | ORAL_TABLET | Freq: Every evening | ORAL | 1 refills | Status: AC | PRN

## 2024-07-30 MED ORDER — SILDENAFIL CITRATE 100 MG PO TABS: 100.0000 mg | ORAL_TABLET | ORAL | 11 refills | Status: AC | PRN

## 2024-07-30 MED ORDER — METFORMIN HCL 1000 MG PO TABS: ORAL_TABLET | ORAL | 1 refills | Status: AC

## 2024-07-30 MED ORDER — SERTRALINE HCL 100 MG PO TABS: 150.0000 mg | ORAL_TABLET | Freq: Every day | ORAL | 2 refills | Status: AC

## 2024-07-30 NOTE — Progress Notes (Signed)
 "  Subjective:  Patient ID: Brandon Rich, male    DOB: November 14, 1960  Age: 64 y.o. MRN: [id number]  CC:  Chief Complaint  Patient presents with   Annual Exam    Patient does not have any questions or concerns. Patient is needing rx refills today    HPI Brandon Rich presents for Annual Exam  PCP:me Optho: Dr. Mac at [facility].  Glaucoma Endocrinology, Dr. Faythe, type 2 diabetes with polyneuropathy, insulin-dependent Neuro/sleep, Dr.Athar, OSA on CPAP ENT, Dr. Soldatova, evaluated in December 2024 for dysphonia, GERD, postnasal drip, septal deviation.on famotidine .  Cardiology, Dr. Lavona years ago.  Ortho, foot and ankle, Dr. Glendia Urology, Dr. Napoleon with history of elevated PSA, 2.33 in 4/25, yearly visits.  Hand/Ortho, Dr. Camella, prior trigger finger Ortho/back, Dr. Laqueta  Treated for acute sinusitis December 8.  Symptoms have resolved.  Hypertension: With OSA on CPAP.  Lisinopril  40 mg daily.Denies any new side effects. Home readings: 130/70 range.  BP Readings from Last 3 Encounters:  07/30/24 120/64  06/25/24 134/66  01/06/24 130/76   Lab Results  Component Value Date   CREATININE 0.91 01/06/2024   Hyperlipidemia: Lipitor 20 mg daily.  Last LDL 42 in June. Lab Results  Component Value Date   CHOL 119 01/06/2024   HDL 30.00 (L) 01/06/2024   LDLCALC 42 01/06/2024   LDLDIRECT 87.0 07/02/2022   TRIG 236.0 (H) 01/06/2024   CHOLHDL 4 01/06/2024   Lab Results  Component Value Date   ALT 18 01/06/2024   AST 35 01/06/2024   ALKPHOS 67 01/06/2024   BILITOT 0.4 01/06/2024   Erectile dysfunction Viagra  100 mg, usually effective, min headache, flushing. No chest pain or dyspnea with exertion, vision or hearing changes.   Anxiety/insomnia Treated with sertraline  150 mg daily, followed by therapist, hydroxyzine  as needed for sleep and gabapentin  for polyneuropathy.  Denies new side effects with medications. Has tried reducing to 100mg  - did not do well, back  on 150mg . Stable. Hydroxyzine  nightly. Gabapentin  as needed - 1-2/month.        07/30/2024    8:15 AM 01/06/2024    8:53 AM 07/06/2023    3:31 PM 01/17/2023    4:20 PM 12/23/2022    9:19 AM  Depression screen PHQ 2/9  Decreased Interest 0 0 0 0 0  Down, Depressed, Hopeless 0 0 0 0 0  PHQ - 2 Score 0 0 0 0 0  Altered sleeping 0 0 1 0 1  Tired, decreased energy 1 1 1 1 1   Change in appetite 0 0 0 0 0  Feeling bad or failure about yourself  0 0 0 0 0  Trouble concentrating 0 0 1 0 0  Moving slowly or fidgety/restless 0 0 0 0 0  Suicidal thoughts 0 0 0 0 0  PHQ-9 Score 1 1  3  1  2    Difficult doing work/chores Not difficult at all Not difficult at all  Not difficult at all      Data saved with a previous flowsheet row definition    Health Maintenance  Topic Date Due   Diabetic kidney evaluation - Urine ACR  12/27/2020   COVID-19 Vaccine (7 - 2025-26 season) 03/19/2024   HEMOGLOBIN A1C  03/31/2024   Diabetic kidney evaluation - eGFR measurement  01/05/2025   FOOT EXAM  01/05/2025   OPHTHALMOLOGY EXAM  07/25/2025   Fecal DNA (Cologuard)  07/27/2025   DTaP/Tdap/Td (2 - Td or Tdap) 12/17/2026   Pneumococcal Vaccine: 50+  Years  Completed   Influenza Vaccine  Completed   Hepatitis C Screening  Completed   HIV Screening  Completed   Zoster Vaccines- Shingrix  Completed   Hepatitis B Vaccines 19-59 Average Risk  Aged Out   HPV VACCINES  Aged Out   Meningococcal B Vaccine  Aged Out  Colonoscopy 07/2022.  Prostate: followed by urology as above.  Lab Results  Component Value Date   PSA1 2.2 12/28/2019   PSA1 2.2 12/01/2018   PSA1 1.3 09/20/2017   PSA 2.75 07/03/2021   PSA 1.17 01/09/2016   PSA 1.29 06/16/2015    Immunization History  Administered Date(s) Administered   Influenza Split 04/04/2012, 04/14/2013, 03/25/2019   Influenza Whole 09/05/2017   Influenza,inj,Quad PF,6+ Mos 04/14/2013, 04/24/2014, 06/16/2015, 03/27/2016, 04/16/2017, 04/10/2018, 04/09/2022    Influenza-Unspecified 04/04/2012, 04/14/2013, 04/24/2014, 06/16/2015, 03/27/2016, 04/16/2017, 04/10/2018, 03/25/2019, 04/03/2021, 05/03/2023, 05/07/2024   PFIZER(Purple Top)SARS-COV-2 Vaccination 10/13/2019, 11/06/2019, 03/18/2020, 01/09/2021, 07/13/2021   PNEUMOCOCCAL CONJUGATE-20 01/06/2024   Pfizer Covid-19 Vaccine Bivalent Booster 68yrs & up 05/09/2023   Pneumococcal Conjugate-13 06/16/2015   Pneumococcal Polysaccharide-23 07/19/2006, 06/06/2014   Respiratory Syncytial Virus Vaccine ,Recomb Aduvanted(Arexvy ) 07/02/2022   Tdap 12/16/2016   Zoster Recombinant(Shingrix) 12/16/2018, 03/25/2019   Zoster, Unspecified 12/16/2018, 03/25/2019  Covid booster - recommended if not received - unsure.   No results found. Regular care with optho - 3-65mo.   Dental: every 84mo, appt today.   Alcohol:none  Tobacco: none  Exercise: minimal recently.  1 fall approximately 6 months ago when he was hiking.  Possible slight change in balance over the past 6 months, no focal weakness, slurred speech, facial droop or acute changes.  Plan separate follow-up to discuss further in the next few weeks. Wt Readings from Last 3 Encounters:  07/30/24 276 lb 12.8 oz (125.6 kg)  06/25/24 280 lb (127 kg)  01/06/24 283 lb (128.4 kg)   Body mass index is 42.09 kg/m.    History Patient Active Problem List   Diagnosis Date Noted   Morbid obesity with body mass index (BMI) of 40.0 to 44.9 in adult [facility]) 04/05/2023   Class 3 severe obesity with serious comorbidity and body mass index (BMI) of 45.0 to 49.9 in adult (HCC) 08/16/2022   Hx of adenomatous colonic polyps 08/09/2022   Type 2 diabetes mellitus with obesity 07/05/2022   At risk for hyperglycemia 03/15/2022   Polyphagia 08/26/2021   Acquired hallux rigidus of left foot 04/14/2021   Nonalcoholic hepatosteatosis 12/22/2020   Other fatigue 12/04/2020   SOBOE (shortness of breath on exertion) 12/04/2020   Vitamin D  deficiency 12/04/2020   Depression  12/04/2020   Acquired claw toe of right foot 04/01/2020   Pain in left elbow 09/18/2019   Class 3 severe obesity due to excess calories with serious comorbidity and body mass index (BMI) of 40.0 to 44.9 in adult [facility]) 09/10/2019   Chest discomfort 02/02/2019   Morbid obesity (HCC) 02/02/2019   Cortical age-related cataract of both eyes 03/30/2017   Nuclear sclerotic cataract of both eyes 03/30/2017   Primary open angle glaucoma (POAG) of both eyes, indeterminate stage 03/30/2017   Type 2 diabetes mellitus with foot ulcer, with long-term current use of insulin (HCC) 03/30/2017   LVH (left ventricular hypertrophy) 03/29/2016   T wave inversion in EKG 02/16/2016   Transaminitis 02/16/2016   Pain of left great toe 08/23/2013   Hypertension associated with type 2 diabetes mellitus (HCC) 12/20/2011   Diabetes mellitus (HCC) 12/20/2011   Hyperlipidemia associated with type 2 diabetes mellitus (  HCC) 12/20/2011   Glaucoma (increased eye pressure) 07/19/1996   Past Medical History:  Diagnosis Date   Acute combined systolic and diastolic heart failure (HCC) 02/16/2016   Allergy    Anxiety    Phreesia 12/27/2019   Anxiety    Arthritis 2019   DDD (degenerative disc disease), cervical    Depression    Diabetes mellitus    Diabetes mellitus without complication (HCC)    Phreesia 12/27/2019   Diabetic foot ulcer (HCC)    FUO (fever of unknown origin) 02/16/2016   Glaucoma    Heart murmur 2003   Heartburn    Hx of adenomatous colonic polyps 08/09/2022   5 adenomas max 10 mm recall 2027   Hyperlipidemia    Hypertension    Hypertrophied anal papilla    LVH (left ventricular hypertrophy) 03/29/2016   Palpitation    Rash and nonspecific skin eruption 02/16/2016   Sleep apnea    CPAP   SOBOE (shortness of breath on exertion)    Transaminitis 02/16/2016   Past Surgical History:  Procedure Laterality Date   AMPUTATION TOE Right 04/2020   AMPUTATION TOE Right 01/2020   AMPUTATION TOE  Left    Big toe   HERNIA REPAIR     SPINE SURGERY     L4/L5   TUMOR REMOVAL  12/1998   Bone Cyst   Allergies[1] Prior to Admission medications  Medication Sig Start Date End Date Taking? Authorizing Provider  aspirin 81 MG tablet Take 81 mg by mouth daily.   Yes [provider]  atorvastatin  (LIPITOR) 20 MG tablet Take 1 tablet (20 mg total) by mouth daily. 01/06/24  Yes Levora Reyes SAUNDERS, MD  BAQSIMI ONE PACK 3 MG/DOSE POWD as directed for severe low blood sugar reaction Nasally as needed for 90 days 03/24/23  Yes [provider]  BD PEN NEEDLE NANO U/F 32G X 4 MM MISC  07/04/18  Yes [provider]  calcium  carbonate (TUMS - DOSED IN MG ELEMENTAL CALCIUM ) 500 MG chewable tablet Chew 2 tablets by mouth daily as needed for indigestion or heartburn.   Yes [provider]  Continuous Glucose Sensor (FREESTYLE LIBRE 3 SENSOR) MISC PLACE ONE SENSOR TO THE BACK OF YOUR UPPER ARM. REPLACE EVERY 14 DAYS. for 84   Yes [provider]  dorzolamide-timolol (COSOPT) 22.3-6.8 MG/ML ophthalmic solution 1 drop 2 (two) times daily.   Yes [provider]  famotidine  (PEPCID ) 20 MG tablet Take 1 tablet (20 mg total) by mouth 2 (two) times daily. 05/09/23  Yes Soldatova, Li[place], MD  fexofenadine (ALLEGRA) 180 MG tablet Take 180 mg by mouth daily.   Yes [provider]  fluticasone  (FLONASE ) 50 MCG/ACT nasal spray PLACE 2 SPRAYS INTO BOTH NOSTRILS DAILY. 07/09/14  Yes Guest, Medford ORN, MD  gabapentin  (NEURONTIN ) 300 MG capsule Take 1 capsule (300 mg total) by mouth at bedtime. With additional midday dose if needed. 01/06/24  Yes Levora Reyes SAUNDERS, MD  hydrOXYzine  (ATARAX ) 25 MG tablet Take 0.5-1 tablets (12.5-25 mg total) by mouth at bedtime as needed for anxiety. 01/06/24  Yes Levora Reyes SAUNDERS, MD  insulin glargine-yfgn (SEMGLEE, YFGN,) 100 UNIT/ML Pen Inject 50 Units into the skin daily. (Titrate as directed up to max daily dose of 100 units) 07/20/22  Yes  [provider]  ipratropium (ATROVENT ) 0.06 % nasal spray Place 1-2 sprays into both nostrils 4 (four) times daily. As needed for nasal congestion 03/30/23  Yes Levora Reyes SAUNDERS, MD  JARDIANCE  25  MG TABS tablet TAKE ONE TABLET BY MOUTH ONE TIME DAILY 07/26/24  Yes Levora Reyes SAUNDERS, MD  lisinopril  (ZESTRIL ) 40 MG tablet Take 1 tablet (40 mg total) by mouth daily. 01/06/24  Yes Levora Reyes SAUNDERS, MD  metFORMIN  (GLUCOPHAGE ) 1000 MG tablet TAKE ONE TABLET BY MOUTH TWICE A DAY 01/06/24  Yes Levora Reyes SAUNDERS, MD  Multiple Vitamin (MULTIVITAMIN) tablet Take 1 tablet by mouth every evening.    Yes [provider]  sertraline  (ZOLOFT ) 100 MG tablet Take 1 tablet (100 mg total) by mouth daily. Patient taking differently: Take 150 mg by mouth daily. 01/06/24  Yes Levora Reyes SAUNDERS, MD  sildenafil  (VIAGRA ) 100 MG tablet TAKE ONE-HALF TO ONE TABLET BY MOUTH ONE TIME DAILY AS NEEDED FOR ERECTILE DYSFUNCTION 03/07/23  Yes Levora Reyes SAUNDERS, MD  tirzepatide  (MOUNJARO ) 15 MG/0.5ML Pen Inject 15 mg Subcutaneously once a week 90 days 06/17/22  Yes   Travoprost, BAK Free, (TRAVATAN) 0.004 % SOLN ophthalmic solution SMARTSIG:In Eye(s) 01/28/21  Yes [provider]  Vitamin D , Ergocalciferol , (DRISDOL ) 1.25 MG (50000 UNIT) CAPS capsule Take 1 capsule (50,000 Units total) by mouth every 7 (seven) days. 09/13/22  Yes Whitmire, Dawn, FNP  amoxicillin -clavulanate (AUGMENTIN ) 875-125 MG tablet Take 1 tablet by mouth 2 (two) times daily. Patient not taking: Reported on 07/30/2024 06/25/24   Levora Reyes SAUNDERS, MD   Social History   Socioeconomic History   Marital status: Married    Spouse name: Keyante Durio   Number of children: 2   Years of education: 16   Highest education level: Bachelor's degree (e.g., BA, AB, BS)  Occupational History   Occupation: Education Officer, [employer]  Tobacco Use   Smoking status: Never   Smokeless tobacco: Never  Vaping Use   Vaping status: Never Used  Substance and  Sexual Activity   Alcohol use: Never    Comment: per pt 1 drink once in a long while   Drug use: Never   Sexual activity: Yes    Partners: Female    Birth control/protection: None  Other Topics Concern   Not on file  Social History Narrative   Married. Education: [employer].    Social Drivers of Health   Tobacco Use: Low Risk (07/30/2024)   Patient History    Smoking Tobacco Use: Never    Smokeless Tobacco Use: Never    Passive Exposure: Not on file  Financial Resource Strain: Low Risk (07/30/2024)   Overall Financial Resource Strain (CARDIA)    Difficulty of Paying Living Expenses: Not very hard  Food Insecurity: No Food Insecurity (07/30/2024)   Epic    Worried About Programme Researcher, Broadcasting/film/video in the Last Year: Never true    Ran Out of Food in the Last Year: Never true  Transportation Needs: No Transportation Needs (07/30/2024)   Epic    Lack of Transportation (Medical): No    Lack of Transportation (Non-Medical): No  Physical Activity: Inactive (07/30/2024)   Exercise Vital Sign    Days of Exercise per Week: 0 days    Minutes of Exercise per Session: Not on file  Stress: Stress Concern Present (07/30/2024)   [employer] of Occupational Health - Occupational Stress Questionnaire    Feeling of Stress: To some extent  Social Connections: Socially Isolated (07/30/2024)   Social Connection and Isolation Panel    Frequency of Communication with Friends and Family: Once a week    Frequency of Social Gatherings with Friends and Family: Once a week    Attends Religious  Services: Never    Active Member of Clubs or Organizations: No    Attends Engineer, Structural: Not on file    Marital Status: Married  Intimate Partner Violence: Not on file  Depression (PHQ2-9): Low Risk (07/30/2024)   Depression (PHQ2-9)    PHQ-2 Score: 1  Alcohol Screen: Low Risk (07/30/2024)   Alcohol Screen    Last Alcohol Screening Score (AUDIT): 1  Housing: High Risk (07/30/2024)   Epic    Unable to  Pay for Housing in the Last Year: Yes    Number of Times Moved in the Last Year: 0    Homeless in the Last Year: No  Utilities: Not on file  Health Literacy: Not on file    Review of Systems 13 point review of systems per patient health survey noted.  Negative other than as indicated above or in HPI.    Objective:   Vitals:   07/30/24 0810  BP: 120/64  Pulse: 85  Resp: 16  Temp: 97.7 F (36.5 C)  TempSrc: Temporal  SpO2: 99%  Weight: 276 lb 12.8 oz (125.6 kg)  Height: 5' 8 (1.727 m)     Physical Exam Vitals reviewed.  Constitutional:      Appearance: He is well-developed.  HENT:     Head: Normocephalic and atraumatic.     Right Ear: External ear normal.     Left Ear: External ear normal.  Eyes:     Conjunctiva/sclera: Conjunctivae normal.     Pupils: Pupils are equal, round, and reactive to light.  Neck:     Thyroid : No thyromegaly.  Cardiovascular:     Rate and Rhythm: Normal rate and regular rhythm.     Heart sounds: Normal heart sounds.  Pulmonary:     Effort: Pulmonary effort is normal. No respiratory distress.     Breath sounds: Normal breath sounds. No wheezing.  Abdominal:     General: There is no distension.     Palpations: Abdomen is soft.     Tenderness: There is no abdominal tenderness.  Musculoskeletal:        General: No tenderness. Normal range of motion.     Cervical back: Normal range of motion and neck supple.  Lymphadenopathy:     Cervical: No cervical adenopathy.  Skin:    General: Skin is warm and dry.  Neurological:     Mental Status: He is alert and oriented to person, place, and time.     Deep Tendon Reflexes: Reflexes are normal and symmetric.  Psychiatric:        Behavior: Behavior normal.        Assessment & Plan:  Brandon Rich is a 64 y.o. male . Annual physical exam  - -anticipatory guidance as below in AVS, screening labs above. Health maintenance items as above in HPI discussed/recommended as applicable.    Erectile dysfunction, unspecified erectile dysfunction type - Plan: sildenafil  (VIAGRA ) 100 MG tablet - Overall stable control with Viagra , continue lowest effective dose.  Type 2 diabetes mellitus with diabetic polyneuropathy, with long-term current use of insulin (HCC)  - Continue follow-up with endocrinology.  Meds refilled.  Anxiety state - Plan: sertraline  (ZOLOFT ) 100 MG tablet Adjustment disorder with mixed anxiety and depressed mood - Plan: hydrOXYzine  (ATARAX ) 25 MG tablet Psychophysiological insomnia - Plan: hydrOXYzine  (ATARAX ) 25 MG tablet  - Stable with 150 mg Zoloft , counseling, hydroxyzine  as needed for sleep.  No changes.  Did not tolerate lower dose of sertraline  previously.  Hyperlipidemia, unspecified  hyperlipidemia type - Plan: atorvastatin  (LIPITOR) 20 MG tablet, Comprehensive metabolic panel with GFR, Lipid panel  -  Stable, tolerating current regimen. Medications refilled. Labs pending as above.   Essential hypertension - Plan: atorvastatin  (LIPITOR) 20 MG tablet, lisinopril  (ZESTRIL ) 40 MG tablet, metFORMIN  (GLUCOPHAGE ) 1000 MG tablet, CBC, TSH  - Stay with current regimen, continue same.  Type 2 diabetes mellitus without complication, without long-term current use of insulin (HCC) - Plan: empagliflozin  (JARDIANCE ) 25 MG TABS tablet  - Follow-up with endocrinology as above, meds refilled.   Meds ordered this encounter  Medications   sildenafil  (VIAGRA ) 100 MG tablet    Sig: Take 1 tablet (100 mg total) by mouth as needed for erectile dysfunction.    Dispense:  5 tablet    Refill:  11   atorvastatin  (LIPITOR) 20 MG tablet    Sig: Take 1 tablet (20 mg total) by mouth daily.    Dispense:  90 tablet    Refill:  1   hydrOXYzine  (ATARAX ) 25 MG tablet    Sig: Take 0.5-1 tablets (12.5-25 mg total) by mouth at bedtime as needed for anxiety.    Dispense:  90 tablet    Refill:  1   empagliflozin  (JARDIANCE ) 25 MG TABS tablet    Sig: Take 1 tablet (25 mg total) by  mouth daily.    Dispense:  30 tablet    Refill:  5   lisinopril  (ZESTRIL ) 40 MG tablet    Sig: Take 1 tablet (40 mg total) by mouth daily.    Dispense:  90 tablet    Refill:  2   metFORMIN  (GLUCOPHAGE ) 1000 MG tablet    Sig: TAKE ONE TABLET BY MOUTH TWICE A DAY    Dispense:  180 tablet    Refill:  1   sertraline  (ZOLOFT ) 100 MG tablet    Sig: Take 1.5 tablets (150 mg total) by mouth daily.    Dispense:  135 tablet    Refill:  2   Patient Instructions  Thank you for coming in today. No change in medications at this time. If there are any concerns on your bloodwork, I will let you know.  Please follow-up in the next few weeks to discuss some of the balance concerns, sooner if any new or worsening symptoms.  Try adding in some exercise, start low, go slow with ultimate goal up to 150 minutes/week as we discussed.  Take care!  Preventive Care 61-57 Years Old, Male Preventive care refers to lifestyle choices and visits with your health care provider that can promote health and wellness. Preventive care visits are also called wellness exams. What can I expect for my preventive care visit? Counseling During your preventive care visit, your health care provider may ask about your: Medical history, including: Past medical problems. Family medical history. Current health, including: Emotional well-being. Home life and relationship well-being. Sexual activity. Lifestyle, including: Alcohol, nicotine or tobacco, and drug use. Access to firearms. Diet, exercise, and sleep habits. Safety issues such as seatbelt and bike helmet use. Sunscreen use. Work and work astronomer. Physical exam Your health care provider will check your: Height and weight. These may be used to calculate your BMI (body mass index). BMI is a measurement that tells if you are at a healthy weight. Waist circumference. This measures the distance around your waistline. This measurement also tells if you are at a healthy  weight and may help predict your risk of certain diseases, such as type 2 diabetes and high blood  pressure. Heart rate and blood pressure. Body temperature. Skin for abnormal spots. What immunizations do I need?  Vaccines are usually given at various ages, according to a schedule. Your health care provider will recommend vaccines for you based on your age, medical history, and lifestyle or other factors, such as travel or where you work. What tests do I need? Screening Your health care provider may recommend screening tests for certain conditions. This may include: Lipid and cholesterol levels. Diabetes screening. This is done by checking your blood sugar (glucose) after you have not eaten for a while (fasting). Hepatitis B test. Hepatitis C test. HIV (human immunodeficiency virus) test. STI (sexually transmitted infection) testing, if you are at risk. Lung cancer screening. Prostate cancer screening. Colorectal cancer screening. Talk with your health care provider about your test results, treatment options, and if necessary, the need for more tests. Follow these instructions at home: Eating and drinking  Eat a diet that includes fresh fruits and vegetables, whole grains, lean protein, and low-fat dairy products. Take vitamin and mineral supplements as recommended by your health care provider. Do not drink alcohol if your health care provider tells you not to drink. If you drink alcohol: Limit how much you have to 0-2 drinks a day. Know how much alcohol is in your drink. In the [place] one drink equals one 12 oz bottle of beer (355 mL), one 5 oz glass of wine (148 mL), or one 1 oz glass of hard liquor (44 mL). Lifestyle Brush your teeth every morning and night with fluoride toothpaste. Floss one time each day. Exercise for at least 30 minutes 5 or more days each week. Do not use any products that contain nicotine or tobacco. These products include cigarettes, chewing tobacco, and  vaping devices, such as e-cigarettes. If you need help quitting, ask your health care provider. Do not use drugs. If you are sexually active, practice safe sex. Use a condom or other form of protection to prevent STIs. Take aspirin only as told by your health care provider. Make sure that you understand how much to take and what form to take. Work with your health care provider to find out whether it is safe and beneficial for you to take aspirin daily. Find healthy ways to manage stress, such as: Meditation, yoga, or listening to music. Journaling. Talking to a trusted person. Spending time with friends and family. Minimize exposure to UV radiation to reduce your risk of skin cancer. Safety Always wear your seat belt while driving or riding in a vehicle. Do not drive: If you have been drinking alcohol. Do not ride with someone who has been drinking. When you are tired or distracted. While texting. If you have been using any mind-altering substances or drugs. Wear a helmet and other protective equipment during sports activities. If you have firearms in your house, make sure you follow all gun safety procedures. What's next? Go to your health care provider once a year for an annual wellness visit. Ask your health care provider how often you should have your eyes and teeth checked. Stay up to date on all vaccines. This information is not intended to replace advice given to you by your health care provider. Make sure you discuss any questions you have with your health care provider. Document Revised: 12/31/2020 Document Reviewed: 12/31/2020 Elsevier Patient Education  2024 Elsevier Inc.     Signed,   Reyes Pines, MD [facility], [facility] 07/30/2024 8:39 AM      [  1]  Allergies Allergen Reactions   Other Other (See Comments)    Hayfever   "

## 2024-07-30 NOTE — Patient Instructions (Addendum)
 Thank you for coming in today. No change in medications at this time. If there are any concerns on your bloodwork, I will let you know.  Please follow-up in the next few weeks to discuss some of the balance concerns, sooner if any new or worsening symptoms.  Try adding in some exercise, start low, go slow with ultimate goal up to 150 minutes/week as we discussed.  Take care!  Preventive Care 8-65 Years Old, Male Preventive care refers to lifestyle choices and visits with your health care provider that can promote health and wellness. Preventive care visits are also called wellness exams. What can I expect for my preventive care visit? Counseling During your preventive care visit, your health care provider may ask about your: Medical history, including: Past medical problems. Family medical history. Current health, including: Emotional well-being. Home life and relationship well-being. Sexual activity. Lifestyle, including: Alcohol, nicotine or tobacco, and drug use. Access to firearms. Diet, exercise, and sleep habits. Safety issues such as seatbelt and bike helmet use. Sunscreen use. Work and work astronomer. Physical exam Your health care provider will check your: Height and weight. These may be used to calculate your BMI (body mass index). BMI is a measurement that tells if you are at a healthy weight. Waist circumference. This measures the distance around your waistline. This measurement also tells if you are at a healthy weight and may help predict your risk of certain diseases, such as type 2 diabetes and high blood pressure. Heart rate and blood pressure. Body temperature. Skin for abnormal spots. What immunizations do I need?  Vaccines are usually given at various ages, according to a schedule. Your health care provider will recommend vaccines for you based on your age, medical history, and lifestyle or other factors, such as travel or where you work. What tests do I  need? Screening Your health care provider may recommend screening tests for certain conditions. This may include: Lipid and cholesterol levels. Diabetes screening. This is done by checking your blood sugar (glucose) after you have not eaten for a while (fasting). Hepatitis B test. Hepatitis C test. HIV (human immunodeficiency virus) test. STI (sexually transmitted infection) testing, if you are at risk. Lung cancer screening. Prostate cancer screening. Colorectal cancer screening. Talk with your health care provider about your test results, treatment options, and if necessary, the need for more tests. Follow these instructions at home: Eating and drinking  Eat a diet that includes fresh fruits and vegetables, whole grains, lean protein, and low-fat dairy products. Take vitamin and mineral supplements as recommended by your health care provider. Do not drink alcohol if your health care provider tells you not to drink. If you drink alcohol: Limit how much you have to 0-2 drinks a day. Know how much alcohol is in your drink. In the [place] one drink equals one 12 oz bottle of beer (355 mL), one 5 oz glass of wine (148 mL), or one 1 oz glass of hard liquor (44 mL). Lifestyle Brush your teeth every morning and night with fluoride toothpaste. Floss one time each day. Exercise for at least 30 minutes 5 or more days each week. Do not use any products that contain nicotine or tobacco. These products include cigarettes, chewing tobacco, and vaping devices, such as e-cigarettes. If you need help quitting, ask your health care provider. Do not use drugs. If you are sexually active, practice safe sex. Use a condom or other form of protection to prevent STIs. Take aspirin only as told by  your health care provider. Make sure that you understand how much to take and what form to take. Work with your health care provider to find out whether it is safe and beneficial for you to take aspirin daily. Find  healthy ways to manage stress, such as: Meditation, yoga, or listening to music. Journaling. Talking to a trusted person. Spending time with friends and family. Minimize exposure to UV radiation to reduce your risk of skin cancer. Safety Always wear your seat belt while driving or riding in a vehicle. Do not drive: If you have been drinking alcohol. Do not ride with someone who has been drinking. When you are tired or distracted. While texting. If you have been using any mind-altering substances or drugs. Wear a helmet and other protective equipment during sports activities. If you have firearms in your house, make sure you follow all gun safety procedures. What's next? Go to your health care provider once a year for an annual wellness visit. Ask your health care provider how often you should have your eyes and teeth checked. Stay up to date on all vaccines. This information is not intended to replace advice given to you by your health care provider. Make sure you discuss any questions you have with your health care provider. Document Revised: 12/31/2020 Document Reviewed: 12/31/2020 Elsevier Patient Education  2024 [employer].

## 2024-08-05 ENCOUNTER — Ambulatory Visit: Payer: Self-pay | Admitting: Family Medicine

## 2024-08-13 NOTE — Progress Notes (Signed)
 Patient read my chart message

## 2024-08-13 NOTE — Progress Notes (Signed)
 Called patient in regards to lab results. [facility]. If patient calls back, please relay message

## 2024-08-30 ENCOUNTER — Ambulatory Visit: Admitting: Family Medicine

## 2024-09-11 ENCOUNTER — Ambulatory Visit: Payer: Managed Care, Other (non HMO) | Admitting: Adult Health
